# Patient Record
Sex: Male | Born: 1935 | Race: White | Hispanic: No | Marital: Married | Smoking: Former smoker
Health system: Southern US, Community
[De-identification: ages and names within clinical notes are randomized; demographics above are authoritative.]

## PROBLEM LIST (undated history)

## (undated) DIAGNOSIS — N400 Enlarged prostate without lower urinary tract symptoms: Secondary | ICD-10-CM

## (undated) DIAGNOSIS — J342 Deviated nasal septum: Secondary | ICD-10-CM

## (undated) DIAGNOSIS — R918 Other nonspecific abnormal finding of lung field: Secondary | ICD-10-CM

## (undated) DIAGNOSIS — Z923 Personal history of irradiation: Secondary | ICD-10-CM

## (undated) DIAGNOSIS — R0602 Shortness of breath: Secondary | ICD-10-CM

## (undated) DIAGNOSIS — F32A Depression, unspecified: Secondary | ICD-10-CM

## (undated) DIAGNOSIS — M1990 Unspecified osteoarthritis, unspecified site: Secondary | ICD-10-CM

## (undated) DIAGNOSIS — K219 Gastro-esophageal reflux disease without esophagitis: Secondary | ICD-10-CM

## (undated) DIAGNOSIS — G709 Myoneural disorder, unspecified: Secondary | ICD-10-CM

## (undated) DIAGNOSIS — F319 Bipolar disorder, unspecified: Secondary | ICD-10-CM

## (undated) DIAGNOSIS — R079 Chest pain, unspecified: Secondary | ICD-10-CM

## (undated) DIAGNOSIS — E785 Hyperlipidemia, unspecified: Secondary | ICD-10-CM

## (undated) DIAGNOSIS — N529 Male erectile dysfunction, unspecified: Secondary | ICD-10-CM

## (undated) DIAGNOSIS — S6990XA Unspecified injury of unspecified wrist, hand and finger(s), initial encounter: Secondary | ICD-10-CM

## (undated) DIAGNOSIS — Z87442 Personal history of urinary calculi: Secondary | ICD-10-CM

## (undated) DIAGNOSIS — C801 Malignant (primary) neoplasm, unspecified: Secondary | ICD-10-CM

## (undated) DIAGNOSIS — F329 Major depressive disorder, single episode, unspecified: Secondary | ICD-10-CM

## (undated) DIAGNOSIS — J189 Pneumonia, unspecified organism: Secondary | ICD-10-CM

## (undated) DIAGNOSIS — G5760 Lesion of plantar nerve, unspecified lower limb: Secondary | ICD-10-CM

## (undated) DIAGNOSIS — H269 Unspecified cataract: Secondary | ICD-10-CM

## (undated) DIAGNOSIS — T7840XA Allergy, unspecified, initial encounter: Secondary | ICD-10-CM

## (undated) DIAGNOSIS — E291 Testicular hypofunction: Secondary | ICD-10-CM

## (undated) HISTORY — DX: Male erectile dysfunction, unspecified: N52.9

## (undated) HISTORY — DX: Gilbert syndrome: E80.4

## (undated) HISTORY — DX: Bipolar disorder, unspecified: F31.9

## (undated) HISTORY — DX: Lesion of plantar nerve, unspecified lower limb: G57.60

## (undated) HISTORY — DX: Other nonspecific abnormal finding of lung field: R91.8

## (undated) HISTORY — DX: Hyperlipidemia, unspecified: E78.5

## (undated) HISTORY — PX: EYE SURGERY: SHX253

## (undated) HISTORY — DX: Unspecified cataract: H26.9

## (undated) HISTORY — DX: Deviated nasal septum: J34.2

## (undated) HISTORY — DX: Gastro-esophageal reflux disease without esophagitis: K21.9

## (undated) HISTORY — DX: Benign prostatic hyperplasia without lower urinary tract symptoms: N40.0

## (undated) HISTORY — DX: Myoneural disorder, unspecified: G70.9

## (undated) HISTORY — DX: Testicular hypofunction: E29.1

## (undated) HISTORY — DX: Chest pain, unspecified: R07.9

---

## 1959-07-03 HISTORY — PX: PILONIDAL CYST EXCISION: SHX744

## 1994-11-01 HISTORY — PX: PROSTATE SURGERY: SHX751

## 2001-02-14 ENCOUNTER — Encounter: Admission: RE | Admit: 2001-02-14 | Discharge: 2001-02-14 | Payer: Self-pay | Admitting: Orthopedic Surgery

## 2001-02-14 ENCOUNTER — Encounter: Payer: Self-pay | Admitting: Orthopedic Surgery

## 2001-07-21 ENCOUNTER — Ambulatory Visit (HOSPITAL_COMMUNITY): Admission: RE | Admit: 2001-07-21 | Discharge: 2001-07-21 | Payer: Self-pay | Admitting: Internal Medicine

## 2002-12-03 ENCOUNTER — Encounter: Payer: Self-pay | Admitting: Otolaryngology

## 2002-12-03 ENCOUNTER — Encounter: Admission: RE | Admit: 2002-12-03 | Discharge: 2002-12-03 | Payer: Self-pay | Admitting: Otolaryngology

## 2007-01-31 HISTORY — PX: KNEE ARTHROSCOPY: SUR90

## 2007-02-24 ENCOUNTER — Encounter: Admission: RE | Admit: 2007-02-24 | Discharge: 2007-02-24 | Payer: Self-pay | Admitting: Orthopedic Surgery

## 2008-11-01 DIAGNOSIS — H269 Unspecified cataract: Secondary | ICD-10-CM

## 2008-11-01 HISTORY — DX: Unspecified cataract: H26.9

## 2012-04-06 ENCOUNTER — Other Ambulatory Visit: Payer: Self-pay | Admitting: Internal Medicine

## 2012-04-06 DIAGNOSIS — M545 Low back pain, unspecified: Secondary | ICD-10-CM

## 2012-04-08 ENCOUNTER — Ambulatory Visit
Admission: RE | Admit: 2012-04-08 | Discharge: 2012-04-08 | Disposition: A | Discharge status: Home / Self Care | Payer: Federal, State, Local not specified - PPO | Source: Ambulatory Visit | Attending: Internal Medicine | Admitting: Internal Medicine

## 2012-04-08 DIAGNOSIS — M545 Low back pain, unspecified: Secondary | ICD-10-CM

## 2012-08-01 DIAGNOSIS — E639 Nutritional deficiency, unspecified: Secondary | ICD-10-CM | POA: Insufficient documentation

## 2012-08-01 DIAGNOSIS — E559 Vitamin D deficiency, unspecified: Secondary | ICD-10-CM | POA: Insufficient documentation

## 2012-08-01 DIAGNOSIS — J449 Chronic obstructive pulmonary disease, unspecified: Secondary | ICD-10-CM | POA: Insufficient documentation

## 2013-01-24 ENCOUNTER — Other Ambulatory Visit: Payer: Self-pay | Admitting: Internal Medicine

## 2013-01-24 DIAGNOSIS — R220 Localized swelling, mass and lump, head: Secondary | ICD-10-CM

## 2013-02-07 ENCOUNTER — Ambulatory Visit
Admission: RE | Admit: 2013-02-07 | Discharge: 2013-02-07 | Disposition: A | Discharge status: Home / Self Care | Payer: Federal, State, Local not specified - PPO | Source: Ambulatory Visit | Attending: Internal Medicine | Admitting: Internal Medicine

## 2013-02-07 DIAGNOSIS — R220 Localized swelling, mass and lump, head: Secondary | ICD-10-CM

## 2013-02-07 DIAGNOSIS — R918 Other nonspecific abnormal finding of lung field: Secondary | ICD-10-CM

## 2013-02-07 HISTORY — DX: Other nonspecific abnormal finding of lung field: R91.8

## 2013-02-07 MED ORDER — IOHEXOL 300 MG/ML  SOLN
75.0000 mL | Freq: Once | INTRAMUSCULAR | Status: AC | PRN
  Administered 2013-02-07: 75 mL via INTRAVENOUS

## 2013-02-09 ENCOUNTER — Other Ambulatory Visit (HOSPITAL_COMMUNITY): Payer: Self-pay | Admitting: Internal Medicine

## 2013-02-09 DIAGNOSIS — R918 Other nonspecific abnormal finding of lung field: Secondary | ICD-10-CM

## 2013-02-13 ENCOUNTER — Encounter: Payer: Self-pay | Admitting: *Deleted

## 2013-02-13 DIAGNOSIS — G5760 Lesion of plantar nerve, unspecified lower limb: Secondary | ICD-10-CM | POA: Insufficient documentation

## 2013-02-13 DIAGNOSIS — N400 Enlarged prostate without lower urinary tract symptoms: Secondary | ICD-10-CM | POA: Insufficient documentation

## 2013-02-13 DIAGNOSIS — N529 Male erectile dysfunction, unspecified: Secondary | ICD-10-CM | POA: Insufficient documentation

## 2013-02-13 DIAGNOSIS — E291 Testicular hypofunction: Secondary | ICD-10-CM | POA: Insufficient documentation

## 2013-02-13 DIAGNOSIS — C3410 Malignant neoplasm of upper lobe, unspecified bronchus or lung: Secondary | ICD-10-CM | POA: Insufficient documentation

## 2013-02-13 DIAGNOSIS — K219 Gastro-esophageal reflux disease without esophagitis: Secondary | ICD-10-CM | POA: Insufficient documentation

## 2013-02-13 DIAGNOSIS — F319 Bipolar disorder, unspecified: Secondary | ICD-10-CM | POA: Insufficient documentation

## 2013-02-13 DIAGNOSIS — G709 Myoneural disorder, unspecified: Secondary | ICD-10-CM | POA: Insufficient documentation

## 2013-02-14 ENCOUNTER — Ambulatory Visit (HOSPITAL_COMMUNITY)
Admission: RE | Admit: 2013-02-14 | Discharge: 2013-02-14 | Disposition: A | Discharge status: Home / Self Care | Payer: Federal, State, Local not specified - PPO | Source: Ambulatory Visit | Attending: Internal Medicine | Admitting: Internal Medicine

## 2013-02-14 DIAGNOSIS — R918 Other nonspecific abnormal finding of lung field: Secondary | ICD-10-CM

## 2013-02-14 DIAGNOSIS — N200 Calculus of kidney: Secondary | ICD-10-CM | POA: Insufficient documentation

## 2013-02-14 DIAGNOSIS — R222 Localized swelling, mass and lump, trunk: Secondary | ICD-10-CM | POA: Insufficient documentation

## 2013-02-14 DIAGNOSIS — J90 Pleural effusion, not elsewhere classified: Secondary | ICD-10-CM | POA: Insufficient documentation

## 2013-02-14 MED ORDER — FLUDEOXYGLUCOSE F - 18 (FDG) INJECTION
19.3000 | Freq: Once | INTRAVENOUS | Status: AC | PRN
  Administered 2013-02-14: 19.3 via INTRAVENOUS

## 2013-02-15 ENCOUNTER — Other Ambulatory Visit: Payer: Self-pay | Admitting: *Deleted

## 2013-02-15 ENCOUNTER — Institutional Professional Consult (permissible substitution) (INDEPENDENT_AMBULATORY_CARE_PROVIDER_SITE_OTHER): Payer: Federal, State, Local not specified - PPO | Admitting: Cardiothoracic Surgery

## 2013-02-15 ENCOUNTER — Encounter: Payer: Self-pay | Admitting: Cardiothoracic Surgery

## 2013-02-15 VITALS — BP 130/83 | HR 84 | Resp 20 | Ht 71.0 in | Wt 185.0 lb

## 2013-02-15 DIAGNOSIS — R911 Solitary pulmonary nodule: Secondary | ICD-10-CM

## 2013-02-15 NOTE — Patient Instructions (Signed)
Lung Cancer Lung cancer is a tumor which starts as a growth in your lungs. Cancer is a group of many related diseases that begin in cells, the building blocks of the body. Normally, cells grow and divide to produce more cells only when the body needs them. Sometimes cells keep dividing when new cells are not needed. These extra cells may form a mass of tissue called a growth or tumor. Tumors can be either benign (not cancerous) or malignant (cancerous). Cancer can begin in any organ or tissue of the body. The original tumor (where the tumor started out) is called the primary cancer and is usually named for where it begins.  Lung cancer is the most common cause of cancer death in men and women. There are several different types of lung cancers. Usually, lung cancer is described as either small-cell lung cancer or non-small-cell lung cancer. Other types of cancer occur in the lungs, including carcinoid and cancers spread from other organs. The types of cancer have different behavior and treatment. CAUSES  This cancer usually starts when the lungs are exposed to harmful chemicals. When you quit smoking, your risk of lung cancer falls each year (but is never the same as a person who has never smoked).  Other risks include:   Radon gas exposure.  Asbestos and other industrial substance exposure.  Second hand tobacco smoke.  Air pollution.  Family or personal history of lung cancer.  Age over 65. SYMPTOMS  Lung cancer can cause many symptoms. They depend on the type of cancer, its location and other factors. Symptoms of lung cancer can include:  Cough (either new, different or more severe).  Shortness of breath.  Coughing up blood (hemoptysis).  Chest pain.  Hoarseness.  Swelling of the face.  Drooping eyelid.  Changes in blood tests: low sodium (hyponatremia), high calcium (hypercalcemia) or low blood count (anemia).  Weight loss. In its early stages, lung cancer may not have  symptoms and can be discovered by accident. Many of the symptoms above can be caused by diseases other than lung cancer. DIAGNOSIS  In early lung cancer, the patient often does not notice problems. It usually has spread by the time problems are first noticed. Your caregiver may suspect lung cancer based on your symptoms, your exam or based on tests (such as x-rays) obtained for other reasons. Common tests that help your caregiver diagnose your condition include:  Chest x-ray.  CT scan of the lungs and chest.  Blood tests. If a tumor is found, a biopsy will be necessary to confirm that cancer is present and to determine the type of cancer. TREATMENT   Surgery offers a hope for a cure if the cancer has not spread and the cancer is not a small cell (oat cell) cancer of the lung. Surgery cannot cure the small cell type of cancer.  Radiation Therapy is a form of high energy X-ray that helps slow or kill the cancer. It is often used along with medications (chemotherapy) to help treat the cancer and control pain.  Chemotherapy is used in combination with surgery in advanced cancer. It is also used in all small cell cancers.  Many new treatments look promising.  Your caregiver can give you more information and discuss treatment options that are best for your type of cancer. HOME CARE INSTRUCTIONS   If you smoke, stop!  Take all medications as told.  Keep all appointments with your caregiver and other specialists.  Ask your caregiver if you should   see a cancer specialist, if that has not been arranged.  If you require oxygen or breathing equipment, be sure you know how to use it and who to call with questions.  Follow any special diet directions. If you have problems with appetite, ask your caregiver for help. SEEK MEDICAL CARE IF:   You have had a surgical procedure are you are having trouble recovering.  You have ongoing weight loss.  You have decreased strength or energy past the  point when your caregiver said you would feel better.  You develop nausea or lightheadedness.  You have pain that is not improving. SEEK IMMEDIATE MEDICAL CARE IF:   You cough up clotted blood or bright red blood.  Your pain is uncontrolled.  You develop new difficulty breathing or chest pain.  You develop swelling in one or both ankles or legs, or swelling in your face or neck.  You develop new headache or confusion. Document Released: 01/24/2001 Document Revised: 01/10/2012 Document Reviewed: 11/04/2008 [employer] Patient Information 2013 [employer].  Lung Resection A lung resection is surgery to remove a lung. When an entire lung is removed, the procedure is called a pneumonectomy. When only part of a lung is removed, the procedure is called a lobectomy. A lung resection is typically done to get rid of a tumor or cancer. This surgery can help relieve some or all of your symptoms. The surgery can also help keep the problem from getting worse. It may provide the best chance for curing your disease. However, surgery may not necessarily cure lung cancer, if that is the problem. Most people need to stay in the hospital for several days after this procedure.  LET YOUR CAREGIVER KNOW ABOUT:  Allergies to food or medicine.  Medicines taken, including vitamins, herbs, eyedrops, over-the-counter medicines, and creams.  Use of steroids (by mouth or creams).  Previous problems with anesthetics or numbing medicines.  History of bleeding problems or blood clots.  Previous surgery.  Other health problems, including diabetes and kidney problems.  Possibility of pregnancy, if this applies. RISKS AND COMPLICATIONS  Lung resections have been done for many years with good results and few complications. However, all surgery is associated with possible risks. Some of these risks are:  Excessive bleeding.  Infection.  Inability to breath without a ventilator.  Persistent shortness of  breath.  Heart problems, including abnormal rhythms and a risk of heart attack or heart failure.  Blood clots.  Injury to a blood vessel.  Injury to a nerve.  Failure to heal properly.  Stroke.  Bronchopleural fistula. This is a small hole between one of the main breathing tubes and the lining of the lungs. BEFORE THE PROCEDURE  In order to prepare for surgery, your caregiver may ask for several tests to be done. These may include:  Blood tests.  Urine tests.  X-rays.  Imaging tests, such as CT scans, MRI scans, and PET scans. These tests are done to find the exact size and location of the tumor that will be removed.  Pulmonary function tests (PFTs). These are breathing tests to assess the function of your lungs before surgery and to decide how to best help your breathing after surgery.  Heart testing. This is done to make sure your heart is strong enough for the procedure.  Bronchoscopy. This is a technique that allows your caregiver to look at the inside of your airways. This is done using a soft, flexible tube (bronchoscope). Along with imaging tests, this can help   your caregiver know the exact location and size of the area that will be removed during surgery.  Lymph node sampling. This may need to be done to see if the tumor has spread. It may be done as a separate surgery or right before your lung resection procedure. PROCEDURE  An intravenous line (IV) will be placed in your arm. You will be given medicine that makes you sleep (general anesthetic).  Once you are asleep, a breathing tube is placed into your windpipe. You may also get pain medicine through a thin, flexible tube (catheter) in your back. The catheter is put through your skin and next to your spinal cord, where it releases anesthetic medicine.  Next, you will be turned onto your side. This makes it easier for your surgeon to reach the area of your ribcage where the surgical cut (incision) will be made. This  area is washed with a disinfectant solution and might also be shaved. A catheter will be put into your bladder to collect urine. Another tube will be carefully passed through your throat and into your stomach.  The surgeon will make an incision on your side, which will start between two of your ribs and go around to your back. Your ribs will be spread and held open. Part of one rib may be removed to make it easier for the surgeon to reach your lung.  Your surgeon will carefully cut the veins, arteries, and bronchus leading to the lung. After being cut, each of these pieces will be sewn or stapled closed. Then, the lung or part of the lung will be removed.  Your surgeon will check inside your chest to make sure there is no bleeding in or around the lungs. Lymph nodes near the lung may also be removed for later tests. This is done to check if your problems have spread to the lymph nodes.  Depending on your situation, your surgeon may put tubes into your chest to drain extra fluid and air from the chest cavity after surgery. After the tubes are in, your ribcage will be closed with stitches. The stitches help your ribcage heal and keep it from moving. After this, the layers of tissue under the skin are closed with more stitches, which will dissolve inside your body over time. Finally, your skin is closed with stitches or staples and covered with a bandage. AFTER THE PROCEDURE   After surgery, you will be taken to the recovery area where a nurse will monitor your progress. You may still have a breathing tube, spinal catheter, bladder catheter, stomach tube, and possibly chest tubes inside your body. These will be removed during your recovery. You may be put on a respirator following surgery if some assistance is needed to help your breathing. When you are awake, stable, and without complications, you will likely continue recovery in the intensive care unit (ICU).  As you wake up, you might feel some aches  and pains in your chest and throat. Sometimes during recovery, patients may shiver or feel nauseous. Both of these symptoms are temporary and may be caused by the anesthesia. Your caregivers can give you medicine to help these problems go away.  The breathing tube will be taken out as soon as your caregivers feel you can breathe on your own. For most people, this happens on the same day as the surgery.  If your surgery and time in the ICU go well, most of the tubes and equipment will be taken out within the first   1 to 2 days after surgery. This is about how long most people stay in the ICU. You may need to stay longer, depending on how you are doing.  You should also start respiratory therapy in the ICU. This therapy uses breathing exercises to help your other lung stay healthy and get stronger.  As you improve, you will be moved to a regular hospital room for continued respiratory therapy, help with your bladder and bowels, and to continue medicines. Most people stay in the hospital for 5 to 7 days. However, your stay may be longer, depending on how your surgery went and how well you are doing.  After your lung or part of your lung is taken out, there will be a space inside your chest. This space will often fill up with fluid over time. The amount of time this takes is different for each person. Because your chest needs to fill with fluid, your surgeon may or may not put a drainage tube in your chest. If there is a chest tube, it will most likely be removed within 24 hours after the surgery.  You will receive care until you are doing well and your caregiver feels it is safe for you to go home or to transfer to an extended care facility. Document Released: 01/08/2003 Document Revised: 01/10/2012 Document Reviewed: 06/17/2011 [employer] Patient Information 2013 [employer]. Lung Resection Care After Refer to this sheet in the next few weeks. These instructions provide you with information on caring  for yourself after your procedure. Your caregiver may also give you more specific instructions. Your treatment has been planned according to current medical practices, but problems sometimes occur. Call your caregiver if you have any problems or questions after your procedure. HOME CARE INSTRUCTIONS  You may resume a normal diet and activities as directed.  Do not smoke or use tobacco products.  Change your bandages (dressings) as directed.  Only take over-the-counter or prescription medicines for pain, discomfort, or fever as directed by your caregiver.  Keep all follow-up appointments as directed.  Try to breathe deeply and cough as directed. Holding a pillow firmly over your ribs may help with discomfort.  If you were given an incentive spirometer in the hospital, continue to use it as directed.  Walk as directed by your caregiver.  You may take a shower and gently wash the area of your surgical cut (incision) with water and soap as directed. Do not use anything else to clean your incision except as directed by your caregiver. Do not take baths or sit in a hot tub. SEEK MEDICAL CARE IF:  You notice redness, swelling, or increasing pain in the incision.  You are bleeding from the incision.  You see pus coming from the incision.  You notice a bad smell coming from the incision or dressing.  Your incision breaks open.  You cough up blood or pus, or you develop a cough that produces bad smelling sputum.  You have pain or swelling in your legs.  You have increasing pain that is not controlled with medicine.  You have trouble managing any of the tubes that have been left in place after surgery. SEEK IMMEDIATE MEDICAL CARE IF:   You have a fever or chills.  You have any reaction or side effects to medicines given.  You have chest pain or an irregular or rapid heartbeat.  You have dizzy episodes or fainting.  You have shortness of breath or difficulty breathing.  You  have persistent nausea   or vomiting.  You have a rash. MAKE SURE YOU:  Understand these instructions.  Will watch your condition.  Will get help right away if you are not doing well or get worse. Document Released: 05/07/2005 Document Revised: 01/10/2012 Document Reviewed: 06/17/2011 [employer] Patient Information 2013 [employer]    

## 2013-02-16 ENCOUNTER — Encounter (HOSPITAL_COMMUNITY): Payer: Self-pay | Admitting: Pharmacy Technician

## 2013-02-19 ENCOUNTER — Other Ambulatory Visit: Payer: Self-pay

## 2013-02-19 DIAGNOSIS — R51 Headache: Secondary | ICD-10-CM

## 2013-02-19 DIAGNOSIS — D381 Neoplasm of uncertain behavior of trachea, bronchus and lung: Secondary | ICD-10-CM

## 2013-02-19 NOTE — Progress Notes (Signed)
[address]            [place] [address]          [phone]      Alvin Hardy [facility] Medical Record #[id number] Date of Birth: 1934-11-22  Referring: [name], MD Primary Care: [name], MD  Chief Complaint:    Chief Complaint  Patient presents with  . Lung Lesion    MTOC/ Surgical eval on lung nodule, Neck CT 02/07/13, PET Scan 02/14/13    History of Present Illness:    78 yo male who noticed voice breaks. Treated with nexium as poss reflux. Because of persistent trouble a  chest xray was done, with finding of lung nodule. Patient has history of smoking, quit in 1980. Patient denies hemoptysis, wheezing.  He denies history of MI or previous cardiac history      Current Activity/ Functional Status:  Patient is independent with mobility/ambulation, transfers, ADL's, IADL's.  Zubrod Score: At the time of surgery this patient's most appropriate activity status/level should be described as: []  Normal activity, no symptoms [x]  Symptoms, fully ambulatory []  Symptoms, in bed less than or equal to 50% of the time []  Symptoms, in bed greater than 50% of the time but less than 100% []  Bedridden []  Moribund   Past Medical History  Diagnosis Date  . BPH (benign prostatic hyperplasia)   . Hypogonadism male   . Bipolar 1 disorder   . ED (erectile dysfunction)   . Hyperlipidemia   . Neuromuscular disorder     peripheral neuropathy  . Cataract 2010    Bilateral  . Laryngopharyngeal reflux   . Gilbert's syndrome   . Deviated septum     TO THE LEFT  . Morton's neuroma     LEFT FOOT  . Lung mass 02/07/13    RIGHT UPPER LOBE    Past Surgical History  Procedure Laterality Date  . Prostate surgery  1996    TURP  . Knee arthroscopy Right 01/2007    Family History  Problem Relation Age of Onset  . Diabetes Mother   . Heart disease Father   . Diabetes Son     History   Social History  . Marital Status: Married   Spouse Name: N/A    Number of Children: N/A  . Years of Education: N/A   Occupational History  . Retired Biomedical scientist   Social History Main Topics  . Smoking status: Former Smoker    Types: Cigarettes    Quit date: 11/01/1978  . Smokeless tobacco: Not on file  . Alcohol Use: Yes  . Drug Use: No  . Sexually Active: Not on file   Other Topics Concern  . Not on file   Social History Narrative  . No narrative on file    History  Smoking status  . Former Smoker  . Types: Cigarettes  . Quit date: 11/01/1978  Smokeless tobacco  . Not on file    History  Alcohol Use  . Yes     Allergies  Allergen Reactions  . Codeine Anaphylaxis  . Zoloft (Sertraline Hcl) Other (See Comments)    REACTION:  unknown    Current Outpatient Prescriptions  Medication Sig Dispense Refill  . calcipotriene (DOVONOX) 0.005 % cream Apply 1 application topically daily as needed (for hands).       . Cholecalciferol (VITAMIN D-3) 1000 UNITS  CAPS Take 1,000 Units by mouth daily.       . clobetasol cream (TEMOVATE) 0.05 % Apply 1 application topically daily.       . clonazePAM (KLONOPIN) 0.5 MG tablet Take 0.5 mg by mouth at bedtime.      Marland Kitchen esomeprazole (NEXIUM) 40 MG capsule Take 40 mg by mouth daily before breakfast.      . finasteride (PROSCAR) 5 MG tablet Take 5 mg by mouth daily.      . folic acid (FOLVITE) 1 MG tablet Take 1 mg by mouth daily.      Marland Kitchen gabapentin (NEURONTIN) 300 MG capsule Take 600-900 mg by mouth 2 (two) times daily. Takes 2 capsules every morning      . glucosamine-chondroitin 500-400 MG tablet Take 1 tablet by mouth 2 (two) times daily.       Marland Kitchen lithium 300 MG tablet Take 300 mg by mouth at bedtime.      . methotrexate (RHEUMATREX) 2.5 MG tablet Take 12.5 mg by mouth every Monday. Caution:Chemotherapy. Protect from light. Take 6 tabs once a week (Monday)      . Multiple Vitamins-Minerals (MULTIVITAMIN PO) Take 2 tablets by mouth daily.       . propranolol (INDERAL) 10 MG  tablet Take 10-20 mg by mouth daily as needed (Takes rarely, only under extremely stressful circumstances.).       Marland Kitchen testosterone cypionate (DEPOTESTOTERONE CYPIONATE) 100 MG/ML injection Inject 100 mg into the muscle every Friday. For IM use only      . vardenafil (LEVITRA) 5 MG tablet Take 5 mg by mouth daily as needed for erectile dysfunction.       Marland Kitchen aspirin EC 81 MG tablet Take 81 mg by mouth daily.      . Calcium Carbonate (CALCIUM 600 PO) Take 1 tablet by mouth daily.      . colesevelam (WELCHOL) 625 MG tablet Take 3 mg by mouth at bedtime. Takes following the largest meal of the day.      . cyanocobalamin 1000 MCG tablet Take 1,000 mcg by mouth daily.       No current facility-administered medications for this visit.       Review of Systems:     Cardiac Review of Systems: Y or N  Chest Pain [  n  ]  Resting SOB [ n  ] Exertional SOB  [ y ]  Pollyann Kennedy Milo.Brash  ]   Pedal Edema [  n ]    Palpitations [n  ] Syncope  [n  ]   Presyncope [ n  ]  General Review of Systems: [Y] = yes [  ]=no Constitional: recent weight change [n  ]; anorexia [  ]; fatigue [ n ]; nausea [  ]; night sweats [  ]; fever [ n ]; or chills [n  ];  Dental: poor dentition[  n]; Last Dentist visit:6 months   Eye : blurred vision [  ]; diplopia [   ]; vision changes [  ];  Amaurosis fugax[  ]; Resp: cough [  ];  wheezing[  ];  hemoptysis[ n ]; shortness of breath[  ]; paroxysmal nocturnal dyspnea[  ]; dyspnea on exertion[  ]; or orthopnea[  ];  GI:  gallstones[  ], vomiting[  ];  dysphagia[  ]; melena[  ];  hematochezia [  ]; heartburn[  ];   Hx of  Colonoscopy[  ]; GU: kidney stones [  ]; hematuria[  ];   dysuria [  ];  nocturia[  ];  history of     obstruction [  ]; urinary frequency [  ]             Skin: rash, swelling[  ];, hair loss[  ];  peripheral edema[  ];  or itching[  ];psorisis  on hands Musculosketetal: myalgias[  ];  joint swelling[  ];  joint erythema[  ];  joint pain[  ];  back pain[  ];  Heme/Lymph: bruising[  ];  bleeding[  ];  anemia[  ];  Neuro: TIA[  ];  headaches[  ];  stroke[  ];  vertigo[  ];  seizures[  ];   paresthesias[y  ];  difficulty walking[  ];  Psych:depression[  ]; anxiety[  ];  Endocrine: diabetes[  ];  thyroid dysfunction[  ];  Immunizations: Flu [ n ]; Pneumococcal[n  ];  Other:  Physical Exam: BP 130/83  Pulse 84  Resp 20  Ht 5\' 11"  (1.803 m)  Wt 185 lb (83.915 kg)  BMI 25.81 kg/m2  SpO2 96%  General appearance: alert, cooperative, appears stated age and no distress Neurologic: intact Heart: regular rate and rhythm, S1, S2 normal, no murmur, click, rub or gallop and normal apical impulse Lungs: clear to auscultation bilaterally and normal percussion bilaterally Abdomen: soft, non-tender; bowel sounds normal; no masses,  no organomegaly Extremities: extremities normal, atraumatic, no cyanosis or edema and Homans sign is negative, no sign of DVT no adenopathy cervical or axillary   Diagnostic Studies & Laboratory data:     Recent Radiology Findings:   Ct Soft Tissue Neck W Contrast  02/07/2013  *RADIOLOGY REPORT*  Clinical Data: 78 year old male with painless palpable abnormality under the right year.  Physician palpable abnormality left neck. Intermittent hoarseness.  BUN and creatinine were obtained on site at [facility] at [address] Results:  BUN 21 mg/dL,  Creatinine 0.9 mg/dL.  CT NECK WITH CONTRAST  Technique:  Multidetector CT imaging of the neck was performed with intravenous contrast.  Contrast: 75mL OMNIPAQUE IOHEXOL 300 MG/ML  SOLN  Comparison: Report of the neck CT 12/03/2002 (no images available).  Findings: In the visible lung apices there is a solid right upper lung lesion with irregular, spiculated margins encompassing 31 x 38 x 27 mm.  Some linear spiculation from lesion extends to the apical pleura  which may be superimposed scarring.  No right paratracheal or visible right hilar lymphadenopathy.  No thoracic inlet lymphadenopathy identified.  In the neck a skin marker was placed on the right side and is seen on series 2 image 29.  The this corresponds to the inferior aspect of the right parotid gland which appears normal.  No regional lymphadenopathy.  No soft tissue mass in this region.  No left side marker is identified to indicate the area of palpable abnormality.  Pharynx,  parapharyngeal spaces, retropharyngeal space, sublingual space, submandibular glands, and parotid glands are within normal limits.  There is mild asymmetry of the larynx which could indicate right vocal cord paralysis.  However, laryngeal asymmetry was also described on the comparison.  The thyroid is normal except for several sub-centimeter hypodense nodules in the left lobe, too small to characterize the most likely benign in the absence of risk factors for thyroid carcinoma.  Major vascular structures in the neck are patent.  Left greater than right carotid bifurcation atherosclerosis.  Negative visualized brain parenchyma.  No cervical lymphadenopathy.  Visualized paranasal sinuses and mastoids are clear.  Advanced degenerative changes in the cervical spine.  No suspicious osseous lesion identified in the neck.  Scoliosis in the thorax.  IMPRESSION:  1.  Spiculated right apical lung nodule/mass encompassing 38 mm greatest dimension.  This is most compatible with a right lung bronchogenic carcinoma. No right paratracheal or visible hilar/mediastinal lymphadenopathy identified. 2.  No neck mass or lymphadenopathy identified.  Study discussed by telephone with  Dr. Rodrigo Ran on 02/07/2013 at 1530 hours.   Original Report Authenticated By: Erskine Speed, M.D.    Nm Pet Image Initial (pi) Skull Base To Thigh  02/14/2013  *RADIOLOGY REPORT*  Clinical Data: Initial treatment strategy for right apical lung mass on prior neck CT.  NUCLEAR  MEDICINE PET SKULL BASE TO THIGH  Fasting Blood Glucose:  98  Technique:  19.3 mCi F-18 FDG was injected intravenously. CT data was obtained and used for attenuation correction and anatomic localization only.  (This was not acquired as a diagnostic CT examination.) Additional exam technical data entered on technologist worksheet.  Comparison:  Neck CT 02/08/1947.  Abdominal pelvic CT from [facility] dated 01/25/2013.  Findings:  Neck:  No abnormal hypermetabolism.  Chest:  Corresponding to the previously described abnormality, within the right apex, is a spiculated mass which measures 3.5 x 2.7 cm and a S.U.V. max of 4.9 on image 78/series 2.  Spiculation contacts the posterior right apical pleura and extends to the superior aspect of the right major fissure.  No abnormal nodal activity within the chest.  Abdomen/Pelvis:  No abnormal hypermetabolism.  Skeleton:  No evidence of hypermetabolic osseous lesions.  CT  images performed for attenuation correction demonstrate no cervical adenopathy.  Mild cardiomegaly, without pericardial effusion.  Tiny right and trace left pleural fluid  Normal adrenal glands.  Right nephrolithiasis.  Bilateral fluid density renal lesions which are likely cysts.  Old granulomatous disease in the spleen.  Normal urinary bladder.  Moderate prostatomegaly.  Tiny fat containing left inguinal hernia.  IMPRESSION:  1.  Hypermetabolic right apical lung mass, most consistent with primary bronchogenic carcinoma. No thoracic nodal or extrathoracic metastasis.  Presuming non-small cell histology, T2aN0M0 or stage IB. 2.  Trace right greater than left bilateral pleural effusions. 3.  Incidental findings, including right nephrolithiasis and prostatomegaly.   Original Report Authenticated By: Jeronimo Greaves, M.D.     Recent Lab Findings: No results found for this basename: WBC, HGB, HCT, PLT, GLUCOSE, CHOL, TRIG, HDL, LDLDIRECT, LDLCALC, ALT, AST, NA, K, CL, CREATININE, BUN, CO2, TSH, INR, GLUF,  HGBA1C      Assessment / Plan:   Clinicial  T2aN0M0 or stage IB Lung Cancer by CT and PET scan without tissue Dx  I have recommended surgical resection of Right Upper Lobe for DX and treatment. Risks of surgery and and treatment options have been discussed with patient in  Detail. Patient is agreeable with proceeding  next week.        Delight Ovens MD  Beeper [phone] Office [phone] 02/19/2013 10:08 PM

## 2013-02-20 ENCOUNTER — Ambulatory Visit (HOSPITAL_COMMUNITY)
Admission: RE | Admit: 2013-02-20 | Discharge: 2013-02-20 | Disposition: A | Discharge status: Home / Self Care | Payer: Medicare Other | Source: Ambulatory Visit | Attending: Cardiothoracic Surgery | Admitting: Cardiothoracic Surgery

## 2013-02-20 ENCOUNTER — Encounter (HOSPITAL_COMMUNITY): Payer: Self-pay

## 2013-02-20 ENCOUNTER — Encounter (HOSPITAL_COMMUNITY)
Admission: RE | Admit: 2013-02-20 | Discharge: 2013-02-20 | Disposition: A | Discharge status: Home / Self Care | Payer: Medicare Other | Source: Ambulatory Visit | Attending: Cardiothoracic Surgery | Admitting: Cardiothoracic Surgery

## 2013-02-20 ENCOUNTER — Inpatient Hospital Stay (HOSPITAL_COMMUNITY)
Admission: RE | Admit: 2013-02-20 | Discharge: 2013-02-20 | Disposition: A | Discharge status: Home / Self Care | Payer: Medicare Other | Source: Ambulatory Visit | Attending: Cardiothoracic Surgery | Admitting: Cardiothoracic Surgery

## 2013-02-20 ENCOUNTER — Telehealth: Payer: Self-pay | Admitting: Cardiothoracic Surgery

## 2013-02-20 VITALS — BP 134/85 | HR 93 | Temp 99.0°F | Resp 20 | Ht 70.0 in | Wt 185.0 lb

## 2013-02-20 DIAGNOSIS — R911 Solitary pulmonary nodule: Secondary | ICD-10-CM

## 2013-02-20 DIAGNOSIS — D381 Neoplasm of uncertain behavior of trachea, bronchus and lung: Secondary | ICD-10-CM

## 2013-02-20 DIAGNOSIS — R51 Headache: Secondary | ICD-10-CM

## 2013-02-20 HISTORY — DX: Unspecified injury of unspecified wrist, hand and finger(s), initial encounter: S69.90XA

## 2013-02-20 HISTORY — DX: Shortness of breath: R06.02

## 2013-02-20 HISTORY — DX: Unspecified osteoarthritis, unspecified site: M19.90

## 2013-02-20 HISTORY — DX: Malignant (primary) neoplasm, unspecified: C80.1

## 2013-02-20 HISTORY — DX: Gastro-esophageal reflux disease without esophagitis: K21.9

## 2013-02-20 LAB — SURGICAL PCR SCREEN
MRSA, PCR: NEGATIVE
Staphylococcus aureus: NEGATIVE

## 2013-02-20 LAB — BLOOD GAS, ARTERIAL
Acid-base deficit: 1.1 mmol/L (ref 0.0–2.0)
Bicarbonate: 22.6 mEq/L (ref 20.0–24.0)
Drawn by: 344381
FIO2: 0.21 %
O2 Saturation: 98.2 %
Patient temperature: 98.6
TCO2: 23.7 mmol/L (ref 0–100)
pCO2 arterial: 34.9 mmHg — ABNORMAL LOW (ref 35.0–45.0)
pH, Arterial: 7.428 (ref 7.350–7.450)
pO2, Arterial: 86.3 mmHg (ref 80.0–100.0)

## 2013-02-20 LAB — URINALYSIS, ROUTINE W REFLEX MICROSCOPIC
Bilirubin Urine: NEGATIVE
Glucose, UA: NEGATIVE mg/dL
Hgb urine dipstick: NEGATIVE
Ketones, ur: NEGATIVE mg/dL
Leukocytes, UA: NEGATIVE
Nitrite: NEGATIVE
Protein, ur: NEGATIVE mg/dL
Specific Gravity, Urine: 1.022 (ref 1.005–1.030)
Urobilinogen, UA: 0.2 mg/dL (ref 0.0–1.0)
pH: 5.5 (ref 5.0–8.0)

## 2013-02-20 LAB — COMPREHENSIVE METABOLIC PANEL
ALT: 23 U/L (ref 0–53)
AST: 26 U/L (ref 0–37)
Albumin: 4 g/dL (ref 3.5–5.2)
Alkaline Phosphatase: 53 U/L (ref 39–117)
BUN: 20 mg/dL (ref 6–23)
CO2: 21 mEq/L (ref 19–32)
Calcium: 9.3 mg/dL (ref 8.4–10.5)
Chloride: 105 mEq/L (ref 96–112)
Creatinine, Ser: 1.02 mg/dL (ref 0.50–1.35)
GFR calc Af Amer: 81 mL/min — ABNORMAL LOW (ref >90)
GFR calc non Af Amer: 70 mL/min — ABNORMAL LOW (ref >90)
Glucose, Bld: 127 mg/dL — ABNORMAL HIGH (ref 70–99)
Potassium: 4.3 mEq/L (ref 3.5–5.1)
Sodium: 137 mEq/L (ref 135–145)
Total Bilirubin: 0.9 mg/dL (ref 0.3–1.2)
Total Protein: 6.7 g/dL (ref 6.0–8.3)

## 2013-02-20 LAB — TYPE AND SCREEN
ABO/RH(D): A POS
Antibody Screen: NEGATIVE

## 2013-02-20 LAB — CBC
HCT: 44.1 % (ref 39.0–52.0)
Hemoglobin: 16 g/dL (ref 13.0–17.0)
MCH: 32.1 pg (ref 26.0–34.0)
MCHC: 36.3 g/dL — ABNORMAL HIGH (ref 30.0–36.0)
MCV: 88.6 fL (ref 78.0–100.0)
Platelets: 193 10*3/uL (ref 150–400)
RBC: 4.98 MIL/uL (ref 4.22–5.81)
RDW: 13.6 % (ref 11.5–15.5)
WBC: 10.6 10*3/uL — ABNORMAL HIGH (ref 4.0–10.5)

## 2013-02-20 LAB — PROTIME-INR
INR: 0.99 (ref 0.00–1.49)
Prothrombin Time: 13 seconds (ref 11.6–15.2)

## 2013-02-20 LAB — APTT: aPTT: 27 seconds (ref 24–37)

## 2013-02-20 LAB — ABO/RH: ABO/RH(D): A POS

## 2013-02-20 MED ORDER — DEXTROSE 5 % IV SOLN
1.5000 g | INTRAVENOUS | Status: DC
  Filled 2013-02-20: qty 1.5

## 2013-02-20 MED ORDER — GADOBENATE DIMEGLUMINE 529 MG/ML IV SOLN
17.0000 mL | Freq: Once | INTRAVENOUS | Status: AC
  Administered 2013-02-20: 17 mL via INTRAVENOUS

## 2013-02-20 NOTE — Pre-Procedure Instructions (Addendum)
RISHAWN WALCK  02/20/2013   Your procedure is scheduled on: 02/21/2013  Report to [facility] at 6:30 AM.  Call this number if you have problems the morning of surgery: [phone]   Remember:   Do not eat food or drink liquids after midnight. Tuesday- tonight   Take these medicines the morning of surgery with A SIP OF WATER: nexium, gabapentin, proscar   Do not wear jewelry, make-up or nail polish.  Do not wear lotions, powders, or perfumes. You may wear deodorant.  Do not shave 48 hours prior to surgery. Men may shave face and neck.  Do not bring valuables to the hospital.  Contacts, dentures or bridgework may not be worn into surgery.  Leave suitcase in the car. After surgery it may be brought to your room.  For patients admitted to the hospital, checkout time is 11:00 AM the day of  discharge.   Patients discharged the day of surgery will not be allowed to drive  home.  Name and phone number of your driver: /w wife   Special Instructions: Shower using CHG 2 nights before surgery and the night before surgery.  If you shower the day of surgery use CHG.  Use special wash - you have one bottle of CHG for all showers.  You should use approximately 1/3 of the bottle for each shower.   Please read over the following fact sheets that you were given: Pain Booklet, Coughing and Deep Breathing, Blood Transfusion Information, MRSA Information and Surgical Site Infection Prevention

## 2013-02-20 NOTE — Progress Notes (Signed)
Seen by Dr. Waynard Edwards, for PCP, perhaps had treadmill test maybe 5 yrs. Ago, pt. Never seen by cardiologist.

## 2013-02-20 NOTE — Telephone Encounter (Signed)
Return the patient's call this morning, he called the office just today with some questions about upcoming surgery tomorrow. I reviewed with him again the findings on CT and PET scan of a right upper lobe hypermetabolic lung lesion highly suspicious for malignancy but without evidence of metastatic spread. Patient is completing his preoperative workup with PFTs today. His questions were answered fully.

## 2013-02-21 ENCOUNTER — Encounter (HOSPITAL_COMMUNITY): Payer: Self-pay | Admitting: *Deleted

## 2013-02-21 ENCOUNTER — Inpatient Hospital Stay (HOSPITAL_COMMUNITY): Payer: Medicare Other

## 2013-02-21 ENCOUNTER — Inpatient Hospital Stay (HOSPITAL_COMMUNITY)
Admission: RE | Admit: 2013-02-21 | Discharge: 2013-02-26 | DRG: 164 | Disposition: A | Discharge status: Home / Self Care | Payer: Medicare Other | Source: Ambulatory Visit | Attending: Cardiothoracic Surgery | Admitting: Cardiothoracic Surgery

## 2013-02-21 ENCOUNTER — Encounter (HOSPITAL_COMMUNITY): Payer: Self-pay | Admitting: Certified Registered"

## 2013-02-21 ENCOUNTER — Inpatient Hospital Stay (HOSPITAL_COMMUNITY): Payer: Medicare Other | Admitting: Certified Registered"

## 2013-02-21 ENCOUNTER — Encounter (HOSPITAL_COMMUNITY)
Admission: RE | Disposition: A | Discharge status: Home / Self Care | Payer: Self-pay | Source: Ambulatory Visit | Attending: Cardiothoracic Surgery

## 2013-02-21 DIAGNOSIS — Z87891 Personal history of nicotine dependence: Secondary | ICD-10-CM

## 2013-02-21 DIAGNOSIS — R911 Solitary pulmonary nodule: Secondary | ICD-10-CM

## 2013-02-21 DIAGNOSIS — F319 Bipolar disorder, unspecified: Secondary | ICD-10-CM | POA: Diagnosis present

## 2013-02-21 DIAGNOSIS — E291 Testicular hypofunction: Secondary | ICD-10-CM | POA: Diagnosis present

## 2013-02-21 DIAGNOSIS — D381 Neoplasm of uncertain behavior of trachea, bronchus and lung: Secondary | ICD-10-CM

## 2013-02-21 DIAGNOSIS — K5900 Constipation, unspecified: Secondary | ICD-10-CM | POA: Diagnosis not present

## 2013-02-21 DIAGNOSIS — C3410 Malignant neoplasm of upper lobe, unspecified bronchus or lung: Secondary | ICD-10-CM | POA: Diagnosis present

## 2013-02-21 DIAGNOSIS — J9819 Other pulmonary collapse: Secondary | ICD-10-CM | POA: Diagnosis not present

## 2013-02-21 DIAGNOSIS — N400 Enlarged prostate without lower urinary tract symptoms: Secondary | ICD-10-CM | POA: Diagnosis present

## 2013-02-21 DIAGNOSIS — D72829 Elevated white blood cell count, unspecified: Secondary | ICD-10-CM | POA: Diagnosis not present

## 2013-02-21 DIAGNOSIS — G609 Hereditary and idiopathic neuropathy, unspecified: Secondary | ICD-10-CM | POA: Diagnosis present

## 2013-02-21 DIAGNOSIS — R339 Retention of urine, unspecified: Secondary | ICD-10-CM | POA: Diagnosis not present

## 2013-02-21 DIAGNOSIS — N529 Male erectile dysfunction, unspecified: Secondary | ICD-10-CM | POA: Diagnosis present

## 2013-02-21 DIAGNOSIS — L4050 Arthropathic psoriasis, unspecified: Secondary | ICD-10-CM | POA: Diagnosis present

## 2013-02-21 DIAGNOSIS — K219 Gastro-esophageal reflux disease without esophagitis: Secondary | ICD-10-CM | POA: Diagnosis present

## 2013-02-21 DIAGNOSIS — E785 Hyperlipidemia, unspecified: Secondary | ICD-10-CM | POA: Diagnosis present

## 2013-02-21 HISTORY — PX: VIDEO BRONCHOSCOPY: SHX5072

## 2013-02-21 HISTORY — PX: VIDEO ASSISTED THORACOSCOPY (VATS)/WEDGE RESECTION: SHX6174

## 2013-02-21 HISTORY — PX: CYSTOSCOPY: SHX5120

## 2013-02-21 LAB — PULMONARY FUNCTION TEST

## 2013-02-21 SURGERY — VIDEO ASSISTED THORACOSCOPY (VATS)/WEDGE RESECTION
Anesthesia: General | Site: Urethra | Laterality: Right | Wound class: Clean Contaminated

## 2013-02-21 MED ORDER — ONDANSETRON HCL 4 MG/2ML IJ SOLN
4.0000 mg | Freq: Four times a day (QID) | INTRAMUSCULAR | Status: DC | PRN
  Administered 2013-02-22 – 2013-02-25 (×7): 4 mg via INTRAVENOUS
  Filled 2013-02-21 (×4): qty 2

## 2013-02-21 MED ORDER — DIPHENHYDRAMINE HCL 12.5 MG/5ML PO ELIX
12.5000 mg | ORAL_SOLUTION | Freq: Four times a day (QID) | ORAL | Status: DC | PRN
  Filled 2013-02-21: qty 5

## 2013-02-21 MED ORDER — ROCURONIUM BROMIDE 100 MG/10ML IV SOLN
INTRAVENOUS | Status: DC | PRN
  Administered 2013-02-21: 50 mg via INTRAVENOUS

## 2013-02-21 MED ORDER — FINASTERIDE 5 MG PO TABS
5.0000 mg | ORAL_TABLET | Freq: Every day | ORAL | Status: DC
  Administered 2013-02-22 – 2013-02-26 (×5): 5 mg via ORAL
  Filled 2013-02-21 (×7): qty 1

## 2013-02-21 MED ORDER — VITAMIN B-12 1000 MCG PO TABS
1000.0000 ug | ORAL_TABLET | Freq: Every day | ORAL | Status: DC
  Administered 2013-02-22 – 2013-02-26 (×5): 1000 ug via ORAL
  Filled 2013-02-21 (×6): qty 1

## 2013-02-21 MED ORDER — GLYCOPYRROLATE 0.2 MG/ML IJ SOLN
INTRAMUSCULAR | Status: DC | PRN
  Administered 2013-02-21: .8 mg via INTRAVENOUS

## 2013-02-21 MED ORDER — GABAPENTIN 600 MG PO TABS
900.0000 mg | ORAL_TABLET | Freq: Every day | ORAL | Status: DC
  Filled 2013-02-21: qty 1.5

## 2013-02-21 MED ORDER — OXYCODONE HCL 5 MG PO TABS: 5.0000 mg | ORAL_TABLET | Freq: Once | ORAL | Status: DC | PRN

## 2013-02-21 MED ORDER — LACTATED RINGERS IV SOLN
INTRAVENOUS | Status: DC | PRN
  Administered 2013-02-21: 08:00:00 via INTRAVENOUS

## 2013-02-21 MED ORDER — FENTANYL CITRATE 0.05 MG/ML IJ SOLN
INTRAMUSCULAR | Status: DC | PRN
  Administered 2013-02-21: 100 ug via INTRAVENOUS
  Administered 2013-02-21 (×8): 50 ug via INTRAVENOUS

## 2013-02-21 MED ORDER — COLESEVELAM HCL 625 MG PO TABS: 3.0000 mg | ORAL_TABLET | Freq: Every day | ORAL | Status: DC

## 2013-02-21 MED ORDER — NALOXONE HCL 0.4 MG/ML IJ SOLN: 0.4000 mg | INTRAMUSCULAR | Status: DC | PRN

## 2013-02-21 MED ORDER — ONDANSETRON HCL 4 MG/2ML IJ SOLN
4.0000 mg | Freq: Four times a day (QID) | INTRAMUSCULAR | Status: DC | PRN
  Filled 2013-02-21 (×3): qty 2

## 2013-02-21 MED ORDER — PHENYLEPHRINE HCL 10 MG/ML IJ SOLN
INTRAMUSCULAR | Status: DC | PRN
  Administered 2013-02-21 (×2): 40 ug via INTRAVENOUS

## 2013-02-21 MED ORDER — DEXTROSE-NACL 5-0.45 % IV SOLN
INTRAVENOUS | Status: DC
  Administered 2013-02-21: 100 mL/h via INTRAVENOUS
  Administered 2013-02-22 (×2): via INTRAVENOUS

## 2013-02-21 MED ORDER — MIDAZOLAM HCL 5 MG/5ML IJ SOLN
INTRAMUSCULAR | Status: DC | PRN
  Administered 2013-02-21: 2 mg via INTRAVENOUS

## 2013-02-21 MED ORDER — 0.9 % SODIUM CHLORIDE (POUR BTL) OPTIME
TOPICAL | Status: DC | PRN
  Administered 2013-02-21: 2000 mL

## 2013-02-21 MED ORDER — PROPOFOL 10 MG/ML IV BOLUS
INTRAVENOUS | Status: DC | PRN
  Administered 2013-02-21: 50 mg via INTRAVENOUS
  Administered 2013-02-21: 150 mg via INTRAVENOUS
  Administered 2013-02-21 (×2): 50 mg via INTRAVENOUS

## 2013-02-21 MED ORDER — TESTOSTERONE CYPIONATE 100 MG/ML IM SOLN
100.0000 mg | INTRAMUSCULAR | Status: DC
  Filled 2013-02-21: qty 1

## 2013-02-21 MED ORDER — NEOSTIGMINE METHYLSULFATE 1 MG/ML IJ SOLN
INTRAMUSCULAR | Status: DC | PRN
  Administered 2013-02-21: 5 mg via INTRAVENOUS

## 2013-02-21 MED ORDER — STERILE WATER FOR IRRIGATION IR SOLN
Status: DC | PRN
  Administered 2013-02-21: 200 mL via INTRAVESICAL

## 2013-02-21 MED ORDER — OXYCODONE HCL 5 MG/5ML PO SOLN: 5.0000 mg | Freq: Once | ORAL | Status: DC | PRN

## 2013-02-21 MED ORDER — GABAPENTIN 300 MG PO CAPS: 600.0000 mg | ORAL_CAPSULE | Freq: Two times a day (BID) | ORAL | Status: DC

## 2013-02-21 MED ORDER — OXYCODONE HCL 5 MG PO TABS: 5.0000 mg | ORAL_TABLET | ORAL | Status: AC | PRN

## 2013-02-21 MED ORDER — LEVALBUTEROL HCL 0.63 MG/3ML IN NEBU
0.6300 mg | INHALATION_SOLUTION | Freq: Four times a day (QID) | RESPIRATORY_TRACT | Status: DC
  Administered 2013-02-21 – 2013-02-22 (×4): 0.63 mg via RESPIRATORY_TRACT
  Filled 2013-02-21 (×8): qty 3

## 2013-02-21 MED ORDER — ONDANSETRON HCL 4 MG/2ML IJ SOLN
INTRAMUSCULAR | Status: DC | PRN
  Administered 2013-02-21: 4 mg via INTRAVENOUS

## 2013-02-21 MED ORDER — VITAMIN D-3 25 MCG (1000 UT) PO CAPS: 1000.0000 [IU] | ORAL_CAPSULE | Freq: Every day | ORAL | Status: DC

## 2013-02-21 MED ORDER — PANTOPRAZOLE SODIUM 40 MG PO TBEC
80.0000 mg | DELAYED_RELEASE_TABLET | Freq: Every day | ORAL | Status: DC
  Administered 2013-02-22 – 2013-02-26 (×5): 80 mg via ORAL
  Filled 2013-02-21: qty 2
  Filled 2013-02-21: qty 1
  Filled 2013-02-21 (×3): qty 2

## 2013-02-21 MED ORDER — ACETAMINOPHEN 10 MG/ML IV SOLN
1000.0000 mg | Freq: Four times a day (QID) | INTRAVENOUS | Status: AC
  Administered 2013-02-21 – 2013-02-22 (×4): 1000 mg via INTRAVENOUS
  Filled 2013-02-21 (×4): qty 100

## 2013-02-21 MED ORDER — VECURONIUM BROMIDE 10 MG IV SOLR
INTRAVENOUS | Status: DC | PRN
  Administered 2013-02-21: 3 mg via INTRAVENOUS
  Administered 2013-02-21: 1 mg via INTRAVENOUS
  Administered 2013-02-21: 2 mg via INTRAVENOUS
  Administered 2013-02-21 (×2): 1 mg via INTRAVENOUS

## 2013-02-21 MED ORDER — OXYCODONE-ACETAMINOPHEN 5-325 MG PO TABS
1.0000 | ORAL_TABLET | ORAL | Status: DC | PRN
  Administered 2013-02-23 – 2013-02-25 (×7): 2 via ORAL
  Filled 2013-02-21 (×8): qty 2

## 2013-02-21 MED ORDER — LITHIUM CARBONATE ER 300 MG PO TBCR
300.0000 mg | EXTENDED_RELEASE_TABLET | Freq: Every day | ORAL | Status: DC
  Administered 2013-02-21 – 2013-02-25 (×5): 300 mg via ORAL
  Filled 2013-02-21 (×6): qty 1

## 2013-02-21 MED ORDER — ACETAMINOPHEN 10 MG/ML IV SOLN: 1000.0000 mg | Freq: Once | INTRAVENOUS | Status: DC

## 2013-02-21 MED ORDER — HYDROMORPHONE HCL PF 1 MG/ML IJ SOLN
0.2500 mg | INTRAMUSCULAR | Status: DC | PRN
  Administered 2013-02-21: 0.5 mg via INTRAVENOUS

## 2013-02-21 MED ORDER — BUPIVACAINE 0.25 % ON-Q PUMP SINGLE CATH 300ML
300.0000 mL | INJECTION | Status: DC
  Filled 2013-02-21: qty 300

## 2013-02-21 MED ORDER — DEXTROSE 5 % IV SOLN
1.5000 g | INTRAVENOUS | Status: DC | PRN
  Administered 2013-02-21: 1.5 g via INTRAVENOUS

## 2013-02-21 MED ORDER — METHOTREXATE 2.5 MG PO TABS
15.0000 mg | ORAL_TABLET | ORAL | Status: DC
  Filled 2013-02-21: qty 6

## 2013-02-21 MED ORDER — POTASSIUM CHLORIDE 10 MEQ/50ML IV SOLN: 10.0000 meq | Freq: Every day | INTRAVENOUS | Status: DC | PRN

## 2013-02-21 MED ORDER — SODIUM CHLORIDE 0.9 % IJ SOLN: 9.0000 mL | INTRAMUSCULAR | Status: DC | PRN

## 2013-02-21 MED ORDER — LIDOCAINE HCL 2 % EX GEL
CUTANEOUS | Status: DC | PRN
  Administered 2013-02-21: 1 via URETHRAL

## 2013-02-21 MED ORDER — ACETAMINOPHEN 10 MG/ML IV SOLN
INTRAVENOUS | Status: DC | PRN
  Administered 2013-02-21: 1000 mg via INTRAVENOUS

## 2013-02-21 MED ORDER — ONDANSETRON HCL 4 MG/2ML IJ SOLN: 4.0000 mg | Freq: Four times a day (QID) | INTRAMUSCULAR | Status: DC | PRN

## 2013-02-21 MED ORDER — VITAMIN D3 25 MCG (1000 UNIT) PO TABS
1000.0000 [IU] | ORAL_TABLET | Freq: Every day | ORAL | Status: DC
  Administered 2013-02-22 – 2013-02-26 (×5): 1000 [IU] via ORAL
  Filled 2013-02-21 (×6): qty 1

## 2013-02-21 MED ORDER — DIPHENHYDRAMINE HCL 50 MG/ML IJ SOLN: 12.5000 mg | Freq: Four times a day (QID) | INTRAMUSCULAR | Status: DC | PRN

## 2013-02-21 MED ORDER — ASPIRIN EC 81 MG PO TBEC
81.0000 mg | DELAYED_RELEASE_TABLET | Freq: Every day | ORAL | Status: DC
  Administered 2013-02-23 – 2013-02-26 (×4): 81 mg via ORAL
  Filled 2013-02-21 (×5): qty 1

## 2013-02-21 MED ORDER — FENTANYL 10 MCG/ML IV SOLN
INTRAVENOUS | Status: DC
  Administered 2013-02-21: 50 ug via INTRAVENOUS
  Administered 2013-02-21 – 2013-02-22 (×2): via INTRAVENOUS
  Administered 2013-02-22: 30 ug via INTRAVENOUS
  Administered 2013-02-22: 50 ug via INTRAVENOUS
  Administered 2013-02-22: 200 ug via INTRAVENOUS
  Administered 2013-02-22: 290 ug via INTRAVENOUS
  Administered 2013-02-22: 80 ug via INTRAVENOUS
  Administered 2013-02-22: 20 ug via INTRAVENOUS
  Administered 2013-02-22: 200 ug via INTRAVENOUS
  Administered 2013-02-23: 02:00:00 via INTRAVENOUS
  Administered 2013-02-23: 120 ug via INTRAVENOUS
  Administered 2013-02-23: 98.74 ug via INTRAVENOUS
  Administered 2013-02-25: 40 ug via INTRAVENOUS
  Administered 2013-02-25: 20 ug via INTRAVENOUS
  Administered 2013-02-25: 130 ug via INTRAVENOUS
  Administered 2013-02-26: 20 ug via INTRAVENOUS
  Filled 2013-02-21 (×4): qty 50

## 2013-02-21 MED ORDER — FOLIC ACID 1 MG PO TABS
1.0000 mg | ORAL_TABLET | Freq: Every day | ORAL | Status: DC
  Administered 2013-02-22 – 2013-02-26 (×5): 1 mg via ORAL
  Filled 2013-02-21 (×5): qty 1

## 2013-02-21 MED ORDER — HYDROMORPHONE HCL PF 1 MG/ML IJ SOLN
INTRAMUSCULAR | Status: AC
  Administered 2013-02-21: 0.5 mg via INTRAVENOUS
  Filled 2013-02-21: qty 1

## 2013-02-21 MED ORDER — COLESEVELAM HCL 625 MG PO TABS
3750.0000 mg | ORAL_TABLET | Freq: Every day | ORAL | Status: DC
  Administered 2013-02-22: 3750 mg via ORAL
  Administered 2013-02-23: 1875 mg via ORAL
  Administered 2013-02-24 – 2013-02-25 (×2): 3750 mg via ORAL
  Filled 2013-02-21 (×6): qty 6

## 2013-02-21 MED ORDER — GABAPENTIN 600 MG PO TABS
600.0000 mg | ORAL_TABLET | Freq: Every morning | ORAL | Status: DC
  Administered 2013-02-22 – 2013-02-26 (×5): 600 mg via ORAL
  Filled 2013-02-21 (×5): qty 1

## 2013-02-21 MED ORDER — BISACODYL 5 MG PO TBEC
10.0000 mg | DELAYED_RELEASE_TABLET | Freq: Every day | ORAL | Status: DC
  Administered 2013-02-22 – 2013-02-26 (×5): 10 mg via ORAL
  Filled 2013-02-21 (×5): qty 2

## 2013-02-21 MED ORDER — HEMOSTATIC AGENTS (NO CHARGE) OPTIME
TOPICAL | Status: DC | PRN
  Administered 2013-02-21: 1 via TOPICAL

## 2013-02-21 MED ORDER — LITHIUM CARBONATE 300 MG PO TABS
300.0000 mg | ORAL_TABLET | Freq: Every day | ORAL | Status: DC
Start: 2013-02-21 — End: 2013-02-21

## 2013-02-21 MED ORDER — GABAPENTIN 300 MG PO CAPS
900.0000 mg | ORAL_CAPSULE | Freq: Every day | ORAL | Status: DC
  Administered 2013-02-21: 900 mg via ORAL
  Administered 2013-02-22: 300 mg via ORAL
  Administered 2013-02-23 – 2013-02-25 (×3): 900 mg via ORAL
  Filled 2013-02-21 (×6): qty 3

## 2013-02-21 SURGICAL SUPPLY — 88 items
APPLICATOR TIP COSEAL (VASCULAR PRODUCTS) IMPLANT
APPLICATOR TIP EXT COSEAL (VASCULAR PRODUCTS) IMPLANT
BLADE SURG 11 STRL SS (BLADE) IMPLANT
BRUSH CYTOL CELLEBRITY 1.5X140 (MISCELLANEOUS) IMPLANT
CANISTER SUCTION 2500CC (MISCELLANEOUS) ×4 IMPLANT
CATH COUDE FOLEY 2W 5CC 16FR (CATHETERS) ×4 IMPLANT
CATH COUDE FOLEY 5CC 14FR (CATHETERS) ×4 IMPLANT
CATH FOLEY 2W COUNCIL 5CC 16FR (CATHETERS) ×4 IMPLANT
CATH FOLEY LATEX FREE 16FR (CATHETERS) ×1
CATH FOLEY LF 16FR (CATHETERS) ×3 IMPLANT
CATH KIT ON Q 5IN SLV (PAIN MANAGEMENT) ×4 IMPLANT
CATH THORACIC 28FR (CATHETERS) ×8 IMPLANT
CATH THORACIC 36FR (CATHETERS) IMPLANT
CATH THORACIC 36FR RT ANG (CATHETERS) IMPLANT
CLIP TI MEDIUM 6 (CLIP) ×4 IMPLANT
CLOTH BEACON ORANGE TIMEOUT ST (SAFETY) ×4 IMPLANT
CONT SPEC 4OZ CLIKSEAL STRL BL (MISCELLANEOUS) ×8 IMPLANT
COVER TABLE BACK 60X90 (DRAPES) ×4 IMPLANT
DERMABOND ADHESIVE PROPEN (GAUZE/BANDAGES/DRESSINGS) ×1
DERMABOND ADVANCED .7 DNX6 (GAUZE/BANDAGES/DRESSINGS) ×3 IMPLANT
DRAPE LAPAROSCOPIC ABDOMINAL (DRAPES) ×4 IMPLANT
DRAPE WARM FLUID 44X44 (DRAPE) ×4 IMPLANT
DRILL BIT 7/64X5 (BIT) ×4 IMPLANT
ELECT BLADE 4.0 EZ CLEAN MEGAD (MISCELLANEOUS) ×12
ELECT REM PT RETURN 9FT ADLT (ELECTROSURGICAL) ×4
ELECTRODE BLDE 4.0 EZ CLN MEGD (MISCELLANEOUS) ×9 IMPLANT
ELECTRODE REM PT RTRN 9FT ADLT (ELECTROSURGICAL) ×3 IMPLANT
FORCEPS BIOP RJ4 1.8 (CUTTING FORCEPS) IMPLANT
GLOVE BIO SURGEON STRL SZ 6.5 (GLOVE) ×20 IMPLANT
GLOVE BIO SURGEON STRL SZ7.5 (GLOVE) ×4 IMPLANT
GLOVE BIOGEL PI IND STRL 6.5 (GLOVE) ×12 IMPLANT
GLOVE BIOGEL PI INDICATOR 6.5 (GLOVE) ×4
GLOVE SS N UNI LF 6.5 STRL (GLOVE) ×4 IMPLANT
GOWN STRL NON-REIN LRG LVL3 (GOWN DISPOSABLE) ×16 IMPLANT
GUIDEWIRE STR DUAL SENSOR (WIRE) ×4 IMPLANT
KIT BASIN OR (CUSTOM PROCEDURE TRAY) ×4 IMPLANT
KIT ROOM TURNOVER OR (KITS) ×4 IMPLANT
KIT SUCTION CATH 14FR (SUCTIONS) ×4 IMPLANT
MARKER SKIN DUAL TIP RULER LAB (MISCELLANEOUS) ×4 IMPLANT
NEEDLE BIOPSY TRANSBRONCH 21G (NEEDLE) IMPLANT
NS IRRIG 1000ML POUR BTL (IV SOLUTION) ×8 IMPLANT
OIL SILICONE PENTAX (PARTS (SERVICE/REPAIRS)) ×4 IMPLANT
PACK CHEST (CUSTOM PROCEDURE TRAY) ×4 IMPLANT
PAD ARMBOARD 7.5X6 YLW CONV (MISCELLANEOUS) ×12 IMPLANT
PENCIL BUTTON HOLSTER BLD 10FT (ELECTRODE) ×4 IMPLANT
POUCH ENDO CATCH II 15MM (MISCELLANEOUS) ×4 IMPLANT
RELOAD EGIA 45 TAN VASC (STAPLE) ×8 IMPLANT
RELOAD EGIA 60 MED/THCK PURPLE (STAPLE) ×20 IMPLANT
RELOAD EGIA BLACK ROTIC 45MM (STAPLE) ×4 IMPLANT
RELOAD EGIA TRIS TAN 45 CVD (STAPLE) ×16 IMPLANT
SCISSORS LAP 5X35 DISP (ENDOMECHANICALS) IMPLANT
SEALANT PROGEL (MISCELLANEOUS) ×4 IMPLANT
SEALANT SURG COSEAL 4ML (VASCULAR PRODUCTS) IMPLANT
SEALANT SURG COSEAL 8ML (VASCULAR PRODUCTS) IMPLANT
SOLUTION ANTI FOG 6CC (MISCELLANEOUS) ×4 IMPLANT
SPONGE GAUZE 4X4 12PLY (GAUZE/BANDAGES/DRESSINGS) ×8 IMPLANT
SPONGE INTESTINAL PEANUT (DISPOSABLE) ×4 IMPLANT
SUT PROLENE 3 0 SH DA (SUTURE) IMPLANT
SUT PROLENE 4 0 RB 1 (SUTURE)
SUT PROLENE 4-0 RB1 .5 CRCL 36 (SUTURE) IMPLANT
SUT SILK  1 MH (SUTURE) ×4
SUT SILK 1 MH (SUTURE) ×12 IMPLANT
SUT SILK 2 0 SH (SUTURE) ×8 IMPLANT
SUT SILK 2 0SH CR/8 30 (SUTURE) IMPLANT
SUT SILK 3 0SH CR/8 30 (SUTURE) IMPLANT
SUT STEEL 1 (SUTURE) IMPLANT
SUT VIC AB 1 CTX 18 (SUTURE) ×4 IMPLANT
SUT VIC AB 1 CTX 36 (SUTURE) ×1
SUT VIC AB 1 CTX36XBRD ANBCTR (SUTURE) ×3 IMPLANT
SUT VIC AB 2-0 CTX 36 (SUTURE) ×4 IMPLANT
SUT VIC AB 2-0 UR6 27 (SUTURE) IMPLANT
SUT VIC AB 3-0 SH 8-18 (SUTURE) IMPLANT
SUT VIC AB 3-0 X1 27 (SUTURE) ×4 IMPLANT
SUT VICRYL 2 TP 1 (SUTURE) ×4 IMPLANT
SWAB COLLECTION DEVICE MRSA (MISCELLANEOUS) IMPLANT
SYR 20ML ECCENTRIC (SYRINGE) ×4 IMPLANT
SYSTEM SAHARA CHEST DRAIN ATS (WOUND CARE) ×4 IMPLANT
TAPE CLOTH SURG 4X10 WHT LF (GAUZE/BANDAGES/DRESSINGS) ×4 IMPLANT
TIP APPLICATOR SPRAY EXTEND 16 (VASCULAR PRODUCTS) IMPLANT
TOWEL OR 17X24 6PK STRL BLUE (TOWEL DISPOSABLE) ×8 IMPLANT
TOWEL OR 17X26 10 PK STRL BLUE (TOWEL DISPOSABLE) ×8 IMPLANT
TRAP SPECIMEN MUCOUS 40CC (MISCELLANEOUS) ×4 IMPLANT
TRAY FOLEY CATH 14FRSI W/METER (CATHETERS) ×4 IMPLANT
TUBE ANAEROBIC SPECIMEN COL (MISCELLANEOUS) IMPLANT
TUBE CONNECTING 12X1/4 (SUCTIONS) ×8 IMPLANT
TUBING CYSTO DISP (UROLOGICAL SUPPLIES) ×4 IMPLANT
TUNNELER SHEATH ON-Q 11GX8 (MISCELLANEOUS) ×8 IMPLANT
WATER STERILE IRR 1000ML POUR (IV SOLUTION) ×8 IMPLANT

## 2013-02-21 NOTE — Anesthesia Postprocedure Evaluation (Signed)
Anesthesia Post Note  Patient: Alvin Hardy  Procedure(s) Performed: Procedure(s) (LRB): Right VIDEO ASSISTED THORACOSCOPY ,Thoracotomy with right upper lobectomy, node sampling  (Right) VIDEO BRONCHOSCOPY (N/A) CYSTOSCOPY FLEXIBLE with insertion of foley catheter (N/A)  Anesthesia type: General  Patient location: PACU  Post pain: Pain level controlled and Adequate analgesia  Post assessment: Post-op Vital signs reviewed, Patient's Cardiovascular Status Stable, Respiratory Function Stable, Patent Airway and Pain level controlled  Last Vitals:  Filed Vitals:   02/21/13 1350  BP: 127/66  Pulse: 90  Temp:   Resp: 18    Post vital signs: Reviewed and stable  Level of consciousness: awake, alert  and oriented  Complications: No apparent anesthesia complications

## 2013-02-21 NOTE — Anesthesia Preprocedure Evaluation (Signed)
Anesthesia Evaluation  Patient identified by MRN, date of birth, ID band Patient awake    Reviewed: Allergy & Precautions, H&P , NPO status , Patient's Chart, lab work & pertinent test results  Airway Mallampati: II  Neck ROM: full    Dental   Pulmonary shortness of breath,  Lung CA         Cardiovascular     Neuro/Psych PSYCHIATRIC DISORDERS Bipolar Disorder  Neuromuscular disease    GI/Hepatic GERD-  ,  Endo/Other    Renal/GU      Musculoskeletal  (+) Arthritis -,   Abdominal   Peds  Hematology   Anesthesia Other Findings   Reproductive/Obstetrics                           Anesthesia Physical Anesthesia Plan  ASA: III  Anesthesia Plan: General   Post-op Pain Management:    Induction: Intravenous  Airway Management Planned: Double Lumen EBT  Additional Equipment: Arterial line and CVP  Intra-op Plan:   Post-operative Plan: Extubation in OR  Informed Consent: I have reviewed the patients History and Physical, chart, labs and discussed the procedure including the risks, benefits and alternatives for the proposed anesthesia with the patient or authorized representative who has indicated his/her understanding and acceptance.     Plan Discussed with: CRNA, Surgeon and Anesthesiologist  Anesthesia Plan Comments:         Anesthesia Quick Evaluation

## 2013-02-21 NOTE — Progress Notes (Signed)
S/p Right upper lobectomy  BP 120/74  Pulse 82  Temp(Src) 98 F (36.7 C) (Oral)  Resp 17  SpO2 97%   Intake/Output Summary (Last 24 hours) at 02/21/13 1720 Last data filed at 02/21/13 1600  Gross per 24 hour  Intake 738.33 ml  Output    568 ml  Net 170.33 ml    Pain well controlled  Minimal drainage and minimal air leak

## 2013-02-21 NOTE — Consult Note (Signed)
Consult requested by: Delight Ovens, MD Dx: BPH, difficult foley  History of Present Illness: Patient is undergoing surgical resection of Right Upper Lobe of lung for diagnosis and treatment purposes. He was asleep in the OR and several attempts at Foley catheter were made and were unsuccessful.  The patient was recently evaluated in the office by Dr. Retta Diones with CT scanning and cystoscopy. On cystoscopy he had nodular regrowth of his prostatic urethra. History of prior TURP in the late 90s.  I reviewed his Epic and Allscripts chart.   Past Medical History  Diagnosis Date  . BPH (benign prostatic hyperplasia)   . Hypogonadism male   . Bipolar 1 disorder   . ED (erectile dysfunction)   . Hyperlipidemia   . Neuromuscular disorder     peripheral neuropathy  . Cataract 2010    Bilateral  . Laryngopharyngeal reflux   . Gilbert's syndrome   . Deviated septum     TO THE LEFT  . Morton's neuroma     LEFT FOOT  . Lung mass 02/07/13    RIGHT UPPER LOBE  . Shortness of breath     voice breaks, ? related to reflux, although told by Dr. Christella Hartigan, S.- early 35 yr. old  . GERD (gastroesophageal reflux disease)   . Cancer     lung  . Arthritis     psoriatic arthritis, ? , treated /w mmethotrexate   . Finger injury     mallet finger-- 02/13/2013, cast in place, followed by dr. Merlyn Lot   Past Surgical History  Procedure Laterality Date  . Prostate surgery  1996    TURP  . Knee arthroscopy Right 01/2007  . Pilonidal cyst excision  1960's  . Eye surgery      cataracts removed fr. both eyes, IOL in place     Home Medications:  Prescriptions prior to admission  Medication Sig Dispense Refill  . calcipotriene (DOVONOX) 0.005 % cream Apply 1 application topically daily as needed (for hands).       . Calcium Carbonate (CALCIUM 600 PO) Take 1 tablet by mouth daily.      . Cholecalciferol (VITAMIN D-3) 1000 UNITS CAPS Take 1,000 Units by mouth daily.       . clobetasol cream (TEMOVATE)  0.05 % Apply 1 application topically daily.       . clonazePAM (KLONOPIN) 0.5 MG tablet Take 0.5 mg by mouth at bedtime.      . colesevelam (WELCHOL) 625 MG tablet Take 3 mg by mouth daily after supper. Takes following the largest meal of the day.      . cyanocobalamin 1000 MCG tablet Take 1,000 mcg by mouth daily.      Marland Kitchen esomeprazole (NEXIUM) 40 MG capsule Take 40 mg by mouth daily before breakfast.      . finasteride (PROSCAR) 5 MG tablet Take 5 mg by mouth daily before breakfast.       . folic acid (FOLVITE) 1 MG tablet Take 1 mg by mouth daily.      Marland Kitchen gabapentin (NEURONTIN) 300 MG capsule Take 600-900 mg by mouth 2 (two) times daily. Takes 2 capsules every morning, 300mg  x3 at night      . glucosamine-chondroitin 500-400 MG tablet Take 1 tablet by mouth 2 (two) times daily.       Marland Kitchen lithium 300 MG tablet Take 300 mg by mouth at bedtime.      . methotrexate (RHEUMATREX) 2.5 MG tablet Take 15 mg by mouth every Monday. Caution:Chemotherapy.  Protect from light. Take 6 tabs once a week (Monday) Last dose of Methotrexate taken at 02/19/2013      . Multiple Vitamins-Minerals (MULTIVITAMIN PO) Take 2 tablets by mouth daily.       . propranolol (INDERAL) 10 MG tablet Take 10-20 mg by mouth daily as needed (Takes rarely, only under extremely stressful circumstances.).       Marland Kitchen testosterone cypionate (DEPOTESTOTERONE CYPIONATE) 100 MG/ML injection Inject 100 mg into the muscle every Friday. For IM use only      . vardenafil (LEVITRA) 5 MG tablet Take 5 mg by mouth daily as needed for erectile dysfunction.       Marland Kitchen aspirin EC 81 MG tablet Take 81 mg by mouth daily.       Allergies:  Allergies  Allergen Reactions  . Codeine Anaphylaxis  . Other     Pt. Remarks that all pain med. Make him nauseated   . Zoloft (Sertraline Hcl) Other (See Comments)    REACTION:  unknown    Family History  Problem Relation Age of Onset  . Diabetes Mother   . Heart disease Father   . Diabetes Son    Social History:   reports that he quit smoking about 34 years ago. His smoking use included Cigarettes. He smoked 0.00 packs per day. He does not have any smokeless tobacco history on file. He reports that  drinks alcohol. He reports that he does not use illicit drugs.  ROS: A complete review of systems was unattainable.   Physical Exam:  Vital signs in last 24 hours: Temp:  [97.6 F (36.4 C)-99 F (37.2 C)] 97.6 F (36.4 C) (04/23 0647) Pulse Rate:  [69-93] 69 (04/23 0647) Resp:  [20] 20 (04/23 0647) BP: (132-134)/(85-86) 132/86 mmHg (04/23 0647) SpO2:  [95 %-98 %] 98 % (04/23 0647) Weight:  [83.915 kg (185 lb)] 83.915 kg (185 lb) (04/22 1501) General:  Patient asleep, intubated in the operating room GU: Blood at meatus  Laboratory Data:  Results for orders placed during the hospital encounter of 02/20/13 (from the past 24 hour(s))  APTT     Status: None   Collection Time    02/20/13  3:38 PM      Result Value Range   aPTT 27  24 - 37 seconds  BLOOD GAS, ARTERIAL     Status: Abnormal   Collection Time    02/20/13  3:38 PM      Result Value Range   FIO2 0.21     Delivery systems ROOM AIR     pH, Arterial 7.428  7.350 - 7.450   pCO2 arterial 34.9 (*) 35.0 - 45.0 mmHg   pO2, Arterial 86.3  80.0 - 100.0 mmHg   Bicarbonate 22.6  20.0 - 24.0 mEq/L   TCO2 23.7  0 - 100 mmol/L   Acid-base deficit 1.1  0.0 - 2.0 mmol/L   O2 Saturation 98.2     Patient temperature 98.6     Collection site LEFT RADIAL     Drawn by [id number]     Sample type ARTERIAL DRAW     Allens test (pass/fail) PASS  PASS  CBC     Status: Abnormal   Collection Time    02/20/13  3:38 PM      Result Value Range   WBC 10.6 (*) 4.0 - 10.5 K/uL   RBC 4.98  4.22 - 5.81 MIL/uL   Hemoglobin 16.0  13.0 - 17.0 g/dL   HCT 04.5  40.9 -  52.0 %   MCV 88.6  78.0 - 100.0 fL   MCH 32.1  26.0 - 34.0 pg   MCHC 36.3 (*) 30.0 - 36.0 g/dL   RDW 45.4  09.8 - 11.9 %   Platelets 193  150 - 400 K/uL  COMPREHENSIVE METABOLIC PANEL     Status:  Abnormal   Collection Time    02/20/13  3:38 PM      Result Value Range   Sodium 137  135 - 145 mEq/L   Potassium 4.3  3.5 - 5.1 mEq/L   Chloride 105  96 - 112 mEq/L   CO2 21  19 - 32 mEq/L   Glucose, Bld 127 (*) 70 - 99 mg/dL   BUN 20  6 - 23 mg/dL   Creatinine, Ser 1.47  0.50 - 1.35 mg/dL   Calcium 9.3  8.4 - 82.9 mg/dL   Total Protein 6.7  6.0 - 8.3 g/dL   Albumin 4.0  3.5 - 5.2 g/dL   AST 26  0 - 37 U/L   ALT 23  0 - 53 U/L   Alkaline Phosphatase 53  39 - 117 U/L   Total Bilirubin 0.9  0.3 - 1.2 mg/dL   GFR calc non Af Amer 70 (*) >90 mL/min   GFR calc Af Amer 81 (*) >90 mL/min  PROTIME-INR     Status: None   Collection Time    02/20/13  3:38 PM      Result Value Range   Prothrombin Time 13.0  11.6 - 15.2 seconds   INR 0.99  0.00 - 1.49  URINALYSIS, ROUTINE W REFLEX MICROSCOPIC     Status: None   Collection Time    02/20/13  3:43 PM      Result Value Range   Color, Urine YELLOW  YELLOW   APPearance CLEAR  CLEAR   Specific Gravity, Urine 1.022  1.005 - 1.030   pH 5.5  5.0 - 8.0   Glucose, UA NEGATIVE  NEGATIVE mg/dL   Hgb urine dipstick NEGATIVE  NEGATIVE   Bilirubin Urine NEGATIVE  NEGATIVE   Ketones, ur NEGATIVE  NEGATIVE mg/dL   Protein, ur NEGATIVE  NEGATIVE mg/dL   Urobilinogen, UA 0.2  0.0 - 1.0 mg/dL   Nitrite NEGATIVE  NEGATIVE   Leukocytes, UA NEGATIVE  NEGATIVE  SURGICAL PCR SCREEN     Status: None   Collection Time    02/20/13  3:44 PM      Result Value Range   MRSA, PCR NEGATIVE  NEGATIVE   Staphylococcus aureus NEGATIVE  NEGATIVE  TYPE AND SCREEN     Status: None   Collection Time    02/20/13  4:00 PM      Result Value Range   ABO/RH(D) A POS     Antibody Screen NEG     Sample Expiration 02/23/2013    ABO/RH     Status: None   Collection Time    02/20/13  4:00 PM      Result Value Range   ABO/RH(D) A POS     Recent Results (from the past 240 hour(s))  SURGICAL PCR SCREEN     Status: None   Collection Time    02/20/13  3:44 PM       Result Value Range Status   MRSA, PCR NEGATIVE  NEGATIVE Final   Staphylococcus aureus NEGATIVE  NEGATIVE Final   Comment:            The Xpert SA Assay (FDA  approved for NASAL specimens     in patients over 38 years of age),     is one component of     a comprehensive surveillance     program.  Test performance has     been validated by [employer] for patients greater     than or equal to 92 year old.     It is not intended     to diagnose infection nor to     guide or monitor treatment.   Creatinine:  Recent Labs  02/20/13 1538  CREATININE 1.02    Impression/Assessment:  BPH Difficult Foley  Plan:  -Patient will require cystoscopy and Foley placement see separate op note. -Continue Foley catheter postop until patient is fully ambulatory and able to void standing. -Followup with Dr. Retta Diones planned in 6 months.  Antony Haste 02/21/2013, 10:14 AM

## 2013-02-21 NOTE — H&P (Signed)
[address]       [place] [address]             [phone]        SILVESTER REIERSON [facility] Medical Record #[id number] Date of Birth: Nov 12, 1934  Referring: No ref. provider found Primary Care: [name], MD  Chief Complaint:    Lung nodule-right   History of Present Illness:    78 yo male who noticed voice breaks. Treated with nexium as poss reflux. Because of persistent trouble a  chest xray was done, with finding of lung nodule. Patient has history of smoking, quit in 1980. Patient denies hemoptysis, wheezing.  He denies history of MI or previous cardiac history      Current Activity/ Functional Status:  Patient is independent with mobility/ambulation, transfers, ADL's, IADL's.  Zubrod Score: At the time of surgery this patient's most appropriate activity status/level should be described as: []  Normal activity, no symptoms [x]  Symptoms, fully ambulatory []  Symptoms, in bed less than or equal to 50% of the time []  Symptoms, in bed greater than 50% of the time but less than 100% []  Bedridden []  Moribund   Past Medical History  Diagnosis Date  . BPH (benign prostatic hyperplasia)   . Hypogonadism male   . Bipolar 1 disorder   . ED (erectile dysfunction)   . Hyperlipidemia   . Neuromuscular disorder     peripheral neuropathy  . Cataract 2010    Bilateral  . Laryngopharyngeal reflux   . Gilbert's syndrome   . Deviated septum     TO THE LEFT  . Morton's neuroma     LEFT FOOT  . Lung mass 02/07/13    RIGHT UPPER LOBE  . Shortness of breath     voice breaks, ? related to reflux, although told by Dr. Christella Hartigan, S.- early 89 yr. old  . GERD (gastroesophageal reflux disease)   . Cancer     lung  . Arthritis     psoriatic arthritis, ? , treated /w mmethotrexate   . Finger injury     mallet finger-- 02/13/2013, cast in place, followed by dr. Merlyn Lot    Past Surgical History  Procedure Laterality Date  . Prostate surgery   1996    TURP  . Knee arthroscopy Right 01/2007  . Pilonidal cyst excision  1960's  . Eye surgery      cataracts removed fr. both eyes, IOL in place     Family History  Problem Relation Age of Onset  . Diabetes Mother   . Heart disease Father   . Diabetes Son     History   Social History  . Marital Status: Married    Spouse Name: N/A    Number of Children: N/A  . Years of Education: N/A   Occupational History  . Retired Biomedical scientist   Social History Main Topics  . Smoking status: Former Smoker    Types: Cigarettes    Quit date: 11/01/1978  . Smokeless tobacco: Not on file  . Alcohol Use: Yes  . Drug Use: No  . Sexually Active: Not on file   Other Topics Concern  . Not on file   Social History Narrative  . No narrative on file    History  Smoking status  . Former Smoker  . Types: Cigarettes  . Quit date: 11/01/1978  Smokeless tobacco  . Not on  file    History  Alcohol Use  . Yes    Comment: occas.     Allergies  Allergen Reactions  . Codeine Anaphylaxis  . Other     Pt. Remarks that all pain med. Make him nauseated   . Zoloft (Sertraline Hcl) Other (See Comments)    REACTION:  unknown    Current Facility-Administered Medications  Medication Dose Route Frequency Provider Last Rate Last Dose  . cefUROXime (ZINACEF) 1.5 g in dextrose 5 % 50 mL IVPB  1.5 g Intravenous 60 min Pre-Op Delight Ovens, MD      . HYDROmorphone (DILAUDID) injection 0.25-0.5 mg  0.25-0.5 mg Intravenous Q5 min PRN Raiford Simmonds, MD      . ondansetron (ZOFRAN) injection 4 mg  4 mg Intravenous Q6H PRN Raiford Simmonds, MD      . oxyCODONE (Oxy IR/ROXICODONE) immediate release tablet 5 mg  5 mg Oral Once PRN Raiford Simmonds, MD       Or  . oxyCODONE (ROXICODONE) 5 MG/5ML solution 5 mg  5 mg Oral Once PRN Raiford Simmonds, MD       Facility-Administered Medications Ordered in Other Encounters  Medication Dose Route Frequency Provider Last Rate Last Dose  . cefUROXime  (ZINACEF) 1.5 g in dextrose 5 % 50 mL IVPB  1.5 g  Continuous PRN Sheppard Evens, CRNA   1.5 g at 02/21/13 0850  . fentaNYL (SUBLIMAZE) injection    PRN Sheppard Evens, CRNA   50 mcg at 02/21/13 0839  . midazolam (VERSED) 5 MG/5ML injection    PRN Sheppard Evens, CRNA   2 mg at 02/21/13 0755  . phenylephrine (NEO-SYNEPHRINE) injection    PRN Sheppard Evens, CRNA   40 mcg at 02/21/13 0848  . propofol (DIPRIVAN) 10 mg/mL bolus/IV push    PRN Sheppard Evens, CRNA   50 mg at 02/21/13 0844  . rocuronium (ZEMURON) injection    PRN Sheppard Evens, CRNA   50 mg at 02/21/13 1610       Review of Systems:     Cardiac Review of Systems: Y or N  Chest Pain [  n  ]  Resting SOB [ n  ] Exertional SOB  [ y ]  Myer Peer  ]   Pedal Edema [  n ]    Palpitations [n  ] Syncope  [n  ]   Presyncope [ n  ]  General Review of Systems: [Y] = yes [  ]=no Constitional: recent weight change [n  ]; anorexia [  ]; fatigue [ n ]; nausea [  ]; night sweats [  ]; fever [ n ]; or chills [n  ];  Dental: poor dentition[  n]; Last Dentist visit:6 months   Eye : blurred vision [  ]; diplopia [   ]; vision changes [  ];  Amaurosis fugax[  ]; Resp: cough [  ];  wheezing[  ];  hemoptysis[ n ]; shortness of breath[  ]; paroxysmal nocturnal dyspnea[  ]; dyspnea on exertion[  ]; or orthopnea[  ];  GI:  gallstones[  ], vomiting[  ];  dysphagia[  ]; melena[  ];  hematochezia [  ]; heartburn[  ];   Hx of  Colonoscopy[  ]; GU: kidney stones [  ]; hematuria[  ];   dysuria [  ];  nocturia[  ];  history of     obstruction [  ]; urinary frequency [  ]             Skin: rash, swelling[  ];, hair loss[  ];  peripheral edema[  ];  or itching[  ];psorisis on hands Musculosketetal: myalgias[  ];  joint swelling[  ];  joint erythema[  ];  joint pain[  ];  back pain[  ];  Heme/Lymph: bruising[  ];   bleeding[  ];  anemia[  ];  Neuro: TIA[  ];  headaches[  ];  stroke[  ];  vertigo[  ];  seizures[  ];   paresthesias[y  ];  difficulty walking[  ];  Psych:depression[  ]; anxiety[  ];  Endocrine: diabetes[  ];  thyroid dysfunction[  ];  Immunizations: Flu [ n ]; Pneumococcal[n  ];  Other:  Physical Exam: BP 132/86  Pulse 69  Temp(Src) 97.6 F (36.4 C) (Oral)  Resp 20  SpO2 98%  General appearance: alert, cooperative, appears stated age and no distress Neurologic: intact Heart: regular rate and rhythm, S1, S2 normal, no murmur, click, rub or gallop and normal apical impulse Lungs: clear to auscultation bilaterally and normal percussion bilaterally Abdomen: soft, non-tender; bowel sounds normal; no masses,  no organomegaly Extremities: extremities normal, atraumatic, no cyanosis or edema and Homans sign is negative, no sign of DVT no adenopathy cervical or axillary   Diagnostic Studies & Laboratory data:     Recent Radiology Findings:   Ct Soft Tissue Neck W Contrast  02/07/2013  *RADIOLOGY REPORT*  Clinical Data: 78 year old male with painless palpable abnormality under the right year.  Physician palpable abnormality left neck. Intermittent hoarseness.  BUN and creatinine were obtained on site at [facility] at [address] Results:  BUN 21 mg/dL,  Creatinine 0.9 mg/dL.  CT NECK WITH CONTRAST  Technique:  Multidetector CT imaging of the neck was performed with intravenous contrast.  Contrast: 75mL OMNIPAQUE IOHEXOL 300 MG/ML  SOLN  Comparison: Report of the neck CT 12/03/2002 (no images available).  Findings: In the visible lung apices there is a solid right upper lung lesion with irregular, spiculated margins encompassing 31 x 38 x 27 mm.  Some linear spiculation from lesion extends to the apical pleura which may be superimposed scarring.  No right paratracheal or visible right hilar lymphadenopathy.  No thoracic inlet lymphadenopathy identified.  In the neck a skin marker  was placed on the right side and is seen on series 2 image 29.  The this corresponds to the inferior aspect of the right parotid gland which appears normal.  No regional lymphadenopathy.  No soft tissue mass in this region.  No left side marker is identified to indicate the area of palpable abnormality.  Pharynx, parapharyngeal spaces, retropharyngeal space, sublingual space, submandibular glands, and  parotid glands are within normal limits.  There is mild asymmetry of the larynx which could indicate right vocal cord paralysis.  However, laryngeal asymmetry was also described on the comparison.  The thyroid is normal except for several sub-centimeter hypodense nodules in the left lobe, too small to characterize the most likely benign in the absence of risk factors for thyroid carcinoma.  Major vascular structures in the neck are patent.  Left greater than right carotid bifurcation atherosclerosis.  Negative visualized brain parenchyma.  No cervical lymphadenopathy.  Visualized paranasal sinuses and mastoids are clear.  Advanced degenerative changes in the cervical spine.  No suspicious osseous lesion identified in the neck.  Scoliosis in the thorax.  IMPRESSION:  1.  Spiculated right apical lung nodule/mass encompassing 38 mm greatest dimension.  This is most compatible with a right lung bronchogenic carcinoma. No right paratracheal or visible hilar/mediastinal lymphadenopathy identified. 2.  No neck mass or lymphadenopathy identified.  Study discussed by telephone with  Dr. Rodrigo Ran on 02/07/2013 at 1530 hours.   Original Report Authenticated By: Erskine Speed, M.D.    Nm Pet Image Initial (pi) Skull Base To Thigh  02/14/2013  *RADIOLOGY REPORT*  Clinical Data: Initial treatment strategy for right apical lung mass on prior neck CT.  NUCLEAR MEDICINE PET SKULL BASE TO THIGH  Fasting Blood Glucose:  98  Technique:  19.3 mCi F-18 FDG was injected intravenously. CT data was obtained and used for attenuation  correction and anatomic localization only.  (This was not acquired as a diagnostic CT examination.) Additional exam technical data entered on technologist worksheet.  Comparison:  Neck CT 02/08/1947.  Abdominal pelvic CT from [facility] dated 01/25/2013.  Findings:  Neck:  No abnormal hypermetabolism.  Chest:  Corresponding to the previously described abnormality, within the right apex, is a spiculated mass which measures 3.5 x 2.7 cm and a S.U.V. max of 4.9 on image 78/series 2.  Spiculation contacts the posterior right apical pleura and extends to the superior aspect of the right major fissure.  No abnormal nodal activity within the chest.  Abdomen/Pelvis:  No abnormal hypermetabolism.  Skeleton:  No evidence of hypermetabolic osseous lesions.  CT  images performed for attenuation correction demonstrate no cervical adenopathy.  Mild cardiomegaly, without pericardial effusion.  Tiny right and trace left pleural fluid  Normal adrenal glands.  Right nephrolithiasis.  Bilateral fluid density renal lesions which are likely cysts.  Old granulomatous disease in the spleen.  Normal urinary bladder.  Moderate prostatomegaly.  Tiny fat containing left inguinal hernia.  IMPRESSION:  1.  Hypermetabolic right apical lung mass, most consistent with primary bronchogenic carcinoma. No thoracic nodal or extrathoracic metastasis.  Presuming non-small cell histology, T2aN0M0 or stage IB. 2.  Trace right greater than left bilateral pleural effusions. 3.  Incidental findings, including right nephrolithiasis and prostatomegaly.   Original Report Authenticated By: Jeronimo Greaves, M.D.     Recent Lab Findings: Lab Results  Component Value Date   WBC 10.6* 02/20/2013   Lab Results  Component Value Date   WBC 10.6* 02/20/2013   HGB 16.0 02/20/2013   HCT 44.1 02/20/2013   PLT 193 02/20/2013   GLUCOSE 127* 02/20/2013   ALT 23 02/20/2013   AST 26 02/20/2013   NA 137 02/20/2013   K 4.3 02/20/2013   CL 105 02/20/2013   CREATININE  1.02 02/20/2013   BUN 20 02/20/2013   CO2 21 02/20/2013   INR 0.99 02/20/2013   MRI AND PFTS reviewed  Assessment / Plan:   Clinicial  T2aN0M0 or stage IB Lung Cancer by CT and PET scan without tissue Dx  I have recommended surgical resection of Right Upper Lobe for DX and treatment. Risks of surgery and and treatment options have been discussed with patient in  Detail. Patient is agreeable with proceeding. week.   The goals risks and alternatives of the planned surgical procedure BRONCH RT VATS LUNG RESECTION  have been discussed with the patient in detail. The risks of the procedure including death, infection, stroke, myocardial infarction, bleeding, blood transfusion have all been discussed specifically.  I have quoted Jenelle Mages a 3% of perioperative mortality and a complication rate as high as 15 %. The patient's questions have been answered.CRAIGORY TOSTE is willing  to proceed with the planned procedure.     Delight Ovens MD  Beeper [phone] Office [phone] 02/21/2013 8:20AM

## 2013-02-21 NOTE — Progress Notes (Signed)
Patient ID: KORRY DALGLEISH, male   DOB: Dec 26, 1934, 78 y.o.   MRN: [id number]  Urology procedure note  Preop diagnosis: BPH, difficult Foley  Postop diagnosis: Same  Procedure: Cystoscopy, Foley placement over wire with Seldinger technique  Surgeon: Deshanti Adcox  Type of anesthesia: Gen.  Findings: On cystoscopy the urethra appeared normal. The prostatic urethra had significant nodular regrowth of BPH with 100% visual obstruction. The bladder neck appeared elevated and required flexible scope to be advanced over a prostate nodule into the bladder. The bladder appeared normal without foreign body, stone or stitch. The mucosa appeared normal.  Description of procedure: Patient was asleep in the OR and several attempts at then made a Foley catheter placement. The patient had been previously prepped and draped for Foley placement therefore a flexible cystoscope was advanced per urethra. Once in the bladder a sensor wire was coiled in the bladder. The scope was removed and over the wire and 16 [place] [employer] tip catheter was advanced. The wire was removed and the Foley drained appropriately. The  balloon was inflated and seated at the bladder neck.and the catheter connected to gravity drainage.   Complications: None   Blood loss: Minimal   Specimens: None   Drains: 16 French Foley  Disposition: Patient turned back over to Dr. Tyrone Sage for his procedure.

## 2013-02-21 NOTE — Brief Op Note (Addendum)
02/21/2013  12:50 PM  PATIENT:  Alvin Hardy  78 y.o. 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 ,BPH, difficult foley  PROCEDURE:  Procedure(s): RIGHT VIDEO ASSISTED THORACOSCOPY/ MINI THORACOTOMY -Right Upper Lobectomy -Lymph Node Sampling Station 2 and 4  VIDEO BRONCHOSCOPY (N/A)  CYSTOSCOPY FLEXIBLE with insertion of foley catheter (N/A)  SURGEON:  Surgeon(s) and Role: Panel 1:    * Delight Ovens, MD - Primary  Panel 2:    * Antony Haste, MD - Primary  PHYSICIAN ASSISTANT: Lowella Dandy PA-C  ANESTHESIA:   general  EBL:  Total I/O In: -  Out: 225 [Urine:125; Blood:100]  BLOOD ADMINISTERED:none  DRAINS: 28 Straight chest tubes right chest x2   LOCAL MEDICATIONS USED:  MARCAINE     SPECIMEN:  Source of Specimen:  Right Upper Lobe of Right Lung, Lymph Nodes  DISPOSITION OF SPECIMEN:  PATHOLOGY  COUNTS:  YES   DICTATION: Reubin Milan Dictation and Other Dictation: Dictation Number .  PLAN OF CARE: Admit to inpatient   PATIENT DISPOSITION:  PACU - hemodynamically stable.   Delay start of Pharmacological VTE agent (>24hrs) due to surgical blood loss or risk of bleeding: yes

## 2013-02-21 NOTE — Anesthesia Procedure Notes (Addendum)
Procedure Name: Intubation Date/Time: 02/21/2013 8:43 AM Performed by: Arlice Colt B Pre-anesthesia Checklist: Patient identified, Emergency Drugs available, Suction available, Patient being monitored and Timeout performed Patient Re-evaluated:Patient Re-evaluated prior to inductionOxygen Delivery Method: Circle system utilized Preoxygenation: Pre-oxygenation with 100% oxygen Intubation Type: IV induction Ventilation: Mask ventilation without difficulty and Oral airway inserted - appropriate to patient size Laryngoscope Size: Hyacinth Meeker and 2 Grade View: Grade I Tube type: Oral Tube size: 8.5 mm Number of attempts: 1 Airway Equipment and Method: Stylet and Oral airway Placement Confirmation: ETT inserted through vocal cords under direct vision,  positive ETCO2,  CO2 detector and breath sounds checked- equal and bilateral Secured at: 23 cm Tube secured with: Tape Dental Injury: Teeth and Oropharynx as per pre-operative assessment

## 2013-02-21 NOTE — Transfer of Care (Signed)
Immediate Anesthesia Transfer of Care Note  Patient: Alvin Hardy  Procedure(s) Performed: Procedure(s): Right VIDEO ASSISTED THORACOSCOPY ,Thoracotomy with right upper lobectomy, node sampling  (Right) VIDEO BRONCHOSCOPY (N/A) CYSTOSCOPY FLEXIBLE with insertion of foley catheter (N/A)  Patient Location: PACU  Anesthesia Type:General  Level of Consciousness: awake, alert  and oriented  Airway & Oxygen Therapy: Patient Spontanous Breathing and Patient connected to nasal cannula oxygen  Post-op Assessment: Report given to PACU RN and Post -op Vital signs reviewed and stable  Post vital signs: Reviewed and stable  Complications: No apparent anesthesia complications

## 2013-02-22 ENCOUNTER — Encounter (HOSPITAL_COMMUNITY): Payer: Self-pay | Admitting: Cardiothoracic Surgery

## 2013-02-22 ENCOUNTER — Inpatient Hospital Stay (HOSPITAL_COMMUNITY): Payer: Medicare Other

## 2013-02-22 LAB — POCT I-STAT 3, ART BLOOD GAS (G3+)
Acid-Base Excess: 3 mmol/L — ABNORMAL HIGH (ref 0.0–2.0)
Bicarbonate: 28.4 mEq/L — ABNORMAL HIGH (ref 20.0–24.0)
O2 Saturation: 98 %
Patient temperature: 98.8
TCO2: 30 mmol/L (ref 0–100)
pCO2 arterial: 47.5 mmHg — ABNORMAL HIGH (ref 35.0–45.0)
pH, Arterial: 7.385 (ref 7.350–7.450)
pO2, Arterial: 107 mmHg — ABNORMAL HIGH (ref 80.0–100.0)

## 2013-02-22 LAB — CBC
Hemoglobin: 13.5 g/dL (ref 13.0–17.0)
MCH: 32.5 pg (ref 26.0–34.0)
MCHC: 36.1 g/dL — ABNORMAL HIGH (ref 30.0–36.0)
Platelets: 178 10*3/uL (ref 150–400)
RDW: 13.5 % (ref 11.5–15.5)

## 2013-02-22 LAB — BASIC METABOLIC PANEL
Calcium: 8.3 mg/dL — ABNORMAL LOW (ref 8.4–10.5)
GFR calc Af Amer: >90 mL/min (ref >90)
GFR calc non Af Amer: 82 mL/min — ABNORMAL LOW (ref >90)
Glucose, Bld: 125 mg/dL — ABNORMAL HIGH (ref 70–99)
Sodium: 135 mEq/L (ref 135–145)

## 2013-02-22 MED ORDER — CLONAZEPAM 0.5 MG PO TABS
0.5000 mg | ORAL_TABLET | Freq: Every day | ORAL | Status: DC
  Administered 2013-02-22 – 2013-02-25 (×4): 0.5 mg via ORAL
  Filled 2013-02-22 (×4): qty 1

## 2013-02-22 MED ORDER — ENOXAPARIN SODIUM 30 MG/0.3ML ~~LOC~~ SOLN
30.0000 mg | SUBCUTANEOUS | Status: DC
  Administered 2013-02-22 – 2013-02-26 (×5): 30 mg via SUBCUTANEOUS
  Filled 2013-02-22 (×5): qty 0.3

## 2013-02-22 MED ORDER — PHENOL 1.4 % MT LIQD
1.0000 | OROMUCOSAL | Status: DC | PRN
  Administered 2013-02-22: 1 via OROMUCOSAL
  Filled 2013-02-22: qty 177

## 2013-02-22 MED ORDER — HYDROMORPHONE HCL PF 1 MG/ML IJ SOLN
INTRAMUSCULAR | Status: DC | PRN
  Administered 2013-02-21: 1 mg via INTRAVENOUS

## 2013-02-22 MED ORDER — WHITE PETROLATUM GEL
Status: AC
  Administered 2013-02-22: 0.2
  Filled 2013-02-22: qty 5

## 2013-02-22 MED ORDER — LEVALBUTEROL HCL 0.63 MG/3ML IN NEBU: 0.6300 mg | INHALATION_SOLUTION | Freq: Four times a day (QID) | RESPIRATORY_TRACT | Status: DC | PRN

## 2013-02-22 MED ORDER — PROMETHAZINE HCL 25 MG/ML IJ SOLN: 6.2500 mg | Freq: Once | INTRAMUSCULAR | Status: DC

## 2013-02-22 NOTE — Progress Notes (Signed)
UR Completed.  Vangie Bicker [phone] 02/22/2013

## 2013-02-22 NOTE — Significant Event (Signed)
1610pm-pt transferred safely to 3316, settled in bed. VS stable prior and during the transfer. Pt spouse made aware of the transfer. Report given to receiving RN. Staff in room, call-bell given, bed in lowest position. Ova Gillentine, Charity fundraiser.

## 2013-02-22 NOTE — Care Management Note (Signed)
    Page 1 of 1   02/26/2013     10:36:33 AM   CARE MANAGEMENT NOTE 02/26/2013  Patient:  Alvin Hardy, Alvin Hardy   Account Number:  [id number]  Date Initiated:  02/22/2013  Documentation initiated by:  [facility]  Subjective/Objective Assessment:   Admitted for minithoracotomy, VATS for lung mass.  Has spouse     Action/Plan:   Anticipated DC Date:  02/26/2013   Anticipated DC Plan:  HOME W HOME HEALTH SERVICES         Choice offered to / List presented to:     DME arranged  Levan Hurst      DME agency  [facility]        Status of service:  Completed, signed off Medicare Important Message given?   (If response is "NO", the following Medicare IM given date fields will be blank) Date Medicare IM given:   Date Additional Medicare IM given:    Discharge Disposition:  HOME/SELF CARE  Per UR Regulation:  Reviewed for med. necessity/level of care/duration of stay  If discussed at Long Length of Stay Meetings, dates discussed:    Comments:  ContactHerb, [place] Spouse [phone]   [phone]  02/26/13- 1000- Donn Pierini RN, BSN [phone] Pt for d/c home today with wife- order for RW placed- [facility] to deliver to room prior to discharge.  02-22-13 4pm Avie Arenas, RNBSN [phone] Patient sitting up in chair.  Confirmed has wife who is able and  will be caring for him at discharge.  plan for tx to SDU today.  CM will follow for further needs.

## 2013-02-22 NOTE — Addendum Note (Signed)
Addendum created 02/22/13 1191 by Sheppard Evens, CRNA   Modules edited: Anesthesia Medication Administration

## 2013-02-22 NOTE — Progress Notes (Signed)
Pt admitted from 2300, family aware of tx, VSS, oriented to unit and routines, pain management discussed, pt verbalizes understanding

## 2013-02-22 NOTE — Progress Notes (Addendum)
                   [address]            [place] [address]          [phone]    1 Day Post-Op Procedure(s) (LRB): Right VIDEO ASSISTED THORACOSCOPY ,Thoracotomy with right upper lobectomy, node sampling  (Right) VIDEO BRONCHOSCOPY (N/A) CYSTOSCOPY FLEXIBLE with insertion of foley catheter (N/A)  Subjective: Patient is receiving breathing treatment now. Has incisional pain and feels faitgued  Objective: Vital signs in last 24 hours: Temp:  [98 F (36.7 C)-99 F (37.2 C)] 98.2 F (36.8 C) (04/24 0740) Pulse Rate:  [61-96] 61 (04/24 0700) Cardiac Rhythm:  [-] Normal sinus rhythm (04/24 0000) Resp:  [7-25] 14 (04/24 0700) BP: (95-140)/(52-88) 120/66 mmHg (04/24 0700) SpO2:  [95 %-100 %] 96 % (04/24 0700) Arterial Line BP: (87-190)/(51-87) 97/87 mmHg (04/24 0400) Weight:  [83.1 kg (183 lb 3.2 oz)] 83.1 kg (183 lb 3.2 oz) (04/24 0600)     Intake/Output from previous day: 04/23 0701 - 04/24 0700 In: 2432.3 [P.O.:840; I.V.:1292.3; IV Piggyback:300] Out: 2063 [Urine:1585; Blood:100; Chest Tube:378]   Physical Exam:  Cardiovascular: RRR Pulmonary: Slightly diminished at bases; no rales, wheezes, or rhonchi. Abdomen: Soft, non tender, bowel sounds present. Extremities: Mild bilateral lower extremity edema. Wounds: Clean and dry.  No erythema or signs of infection. Chest Tubes: To suction, no air leak  Lab Results: CBC: Recent Labs  02/20/13 1538 02/22/13 0415  WBC 10.6* 12.4*  HGB 16.0 13.5  HCT 44.1 37.4*  PLT 193 178   BMET:  Recent Labs  02/20/13 1538 02/22/13 0415  NA 137 135  K 4.3 4.1  CL 105 101  CO2 21 27  GLUCOSE 127* 125*  BUN 20 16  CREATININE 1.02 0.89  CALCIUM 9.3 8.3*    PT/INR:  Recent Labs  02/20/13 1538  LABPROT 13.0  INR 0.99   ABG:  INR: Will add last result for INR, ABG once components are confirmed Will add last 4 CBG results once components are confirmed  Assessment/Plan:  1. CV - SR 2.  Pulmonary -  Chest tubes with a little less than 400 cc of output since surgery. CXR this am appears to show low lung volumes, bibasilar atelectasis, no pneumothorax. Chest tubes to remain to suction for now. 3.Patient requesting Clonazepam at bedtime. Has taken this for years and is unable to sleep without it. Will order 4.Decrease IVF 5.Possible transfer to 3300  ZIMMERMAN,DONIELLE MPA-C 02/22/2013,7:57 AM  Good pain control No hungry Ct to water seal To 3300 I have seen and examined Alvin Hardy and agree with the above assessment  and plan.  Delight Ovens MD Beeper [phone] Office [phone] 02/22/2013 8:38 AM

## 2013-02-22 NOTE — Progress Notes (Signed)
Left radial a-line dampened, positional.  Able to draw adequate sample for ABG from catheter hub.  A-line removed, catheter intact.  Pressure applied to sight and dressing applied.

## 2013-02-23 ENCOUNTER — Inpatient Hospital Stay (HOSPITAL_COMMUNITY): Payer: Medicare Other

## 2013-02-23 LAB — CBC
Hemoglobin: 13.2 g/dL (ref 13.0–17.0)
MCHC: 35.7 g/dL (ref 30.0–36.0)
RDW: 13.4 % (ref 11.5–15.5)
WBC: 13.6 10*3/uL — ABNORMAL HIGH (ref 4.0–10.5)

## 2013-02-23 LAB — COMPREHENSIVE METABOLIC PANEL
ALT: 12 U/L (ref 0–53)
Albumin: 3.1 g/dL — ABNORMAL LOW (ref 3.5–5.2)
Alkaline Phosphatase: 43 U/L (ref 39–117)
Glucose, Bld: 120 mg/dL — ABNORMAL HIGH (ref 70–99)
Potassium: 3.9 mEq/L (ref 3.5–5.1)
Sodium: 138 mEq/L (ref 135–145)
Total Protein: 5.9 g/dL — ABNORMAL LOW (ref 6.0–8.3)

## 2013-02-23 NOTE — Progress Notes (Addendum)
TCTS DAILY PROGRESS NOTE                   [address]            [place] [address]          [phone]      2 Days Post-Op Procedure(s) (LRB): Right VIDEO ASSISTED THORACOSCOPY ,Thoracotomy with right upper lobectomy, node sampling  (Right) VIDEO BRONCHOSCOPY (N/A) CYSTOSCOPY FLEXIBLE with insertion of foley catheter (N/A)  Total Length of Stay:  LOS: 2 days   Subjective: Feels better today, appetite improving , less pain, less SOB  Objective: Vital signs in last 24 hours: Temp:  [98.2 F (36.8 C)-99.4 F (37.4 C)] 99.4 F (37.4 C) (04/25 0722) Pulse Rate:  [72-98] 94 (04/25 0423) Cardiac Rhythm:  [-] Normal sinus rhythm (04/25 0423) Resp:  [13-29] 21 (04/25 0520) BP: (109-145)/(61-99) 130/66 mmHg (04/25 0423) SpO2:  [90 %-98 %] 96 % (04/25 0423) Weight:  [183 lb (83.008 kg)] 183 lb (83.008 kg) (04/24 1556)  Filed Weights   02/22/13 0600 02/22/13 1556  Weight: 183 lb 3.2 oz (83.1 kg) 183 lb (83.008 kg)    Weight change: -3.2 oz (-0.092 kg)   Hemodynamic parameters for last 24 hours:    Intake/Output from previous day: 04/24 0701 - 04/25 0700 In: 1450.3 [P.O.:240; I.V.:1110.3; IV Piggyback:100] Out: 9147 [Urine:3475; Chest Tube:150]  Intake/Output this shift: Total I/O In: -  Out: 275 [Urine:275]  Current Meds: Scheduled Meds: . aspirin EC  81 mg Oral Daily  . bisacodyl  10 mg Oral Daily  . cholecalciferol  1,000 Units Oral Daily  . clonazePAM  0.5 mg Oral QHS  . colesevelam  3,750 mg Oral Q supper  . enoxaparin  30 mg Subcutaneous Q24H  . fentaNYL   Intravenous Q4H  . finasteride  5 mg Oral QAC breakfast  . folic acid  1 mg Oral Daily  . gabapentin  900 mg Oral QHS  . gabapentin  600 mg Oral q morning - 10a  . lithium carbonate  300 mg Oral QHS  . [START ON 02/26/2013] methotrexate  15 mg Oral Q Mon  . pantoprazole  80 mg Oral QAC breakfast  . promethazine  6.25 mg Intravenous Once  . testosterone cypionate  100 mg Intramuscular  Q Fri  . cyanocobalamin  1,000 mcg Oral Daily   Continuous Infusions: . bupivacaine 0.25 % ON-Q pump SINGLE CATH 300 mL    . dextrose 5 % and 0.45% NaCl 50 mL/hr at 02/22/13 0840   PRN Meds:.diphenhydrAMINE, diphenhydrAMINE, levalbuterol, naloxone, ondansetron (ZOFRAN) IV, ondansetron (ZOFRAN) IV, oxyCODONE-acetaminophen, phenol, potassium chloride, sodium chloride  General appearance: alert, cooperative and no distress Heart: regular rate and rhythm and tachy Lungs: clear to auscultation bilaterally Abdomen: mod distension, soft, nontender, + BS Extremities: no edema Wound: dressings CDI  Lab Results: CBC: Recent Labs  02/22/13 0415 02/23/13 0419  WBC 12.4* 13.6*  HGB 13.5 13.2  HCT 37.4* 37.0*  PLT 178 171   BMET:  Recent Labs  02/22/13 0415 02/23/13 0419  NA 135 138  K 4.1 3.9  CL 101 104  CO2 27 28  GLUCOSE 125* 120*  BUN 16 9  CREATININE 0.89 0.89  CALCIUM 8.3* 8.8    PT/INR:  Recent Labs  02/20/13 1538  LABPROT 13.0  INR 0.99   Radiology: Dg Chest Port 1 View  02/22/2013  *RADIOLOGY REPORT*  Clinical Data: Postoperative thoracotomy with chest tubes in place  Left lung is  clear.  PORTABLE CHEST - 1 VIEW  Comparison:  February 21, 2013  Findings: Two chest tubes remain in place in the right.  There is a tiny apical pneumothorax.  There is a central catheter on the right with the tip either in or overlying the superior vena cava.  There remains a soft tissue prominence in the right hilum with what is felt to be partial collapse of the right upper lobe.  Adenopathy in the right hilar region cannot be excluded.  Lungs are otherwise clear.  Heart is upper normal in size with normal pulmonary vascularity. There is stable rib fractures on the right.  IMPRESSION: Minimal pneumothorax on the right with right-sided chest tubes unchanged in position.  Soft tissue fullness in the right perihilar region remains, felt in large part to be due to focal lung collapse in this area.  Left lung is clear.   Original Report Authenticated By: Bretta Bang, M.D.    Dg Chest Portable 1 View  02/21/2013  *RADIOLOGY REPORT*  Clinical Data: Status post VATS  PORTABLE CHEST - 1 VIEW  Comparison: Two-view chest 02/20/2013.  Findings: A right subclavian catheter terminates in the SVC.  To right-sided chest tubes are in place.  There is no pneumothorax. Patient is status post right upper lobectomy.  A right rib osteotomies noted.  Mild bibasilar atelectasis is worse on the left.  IMPRESSION:  1.  Postoperative changes of VATS and right upper lobectomy. 2.  Low lung volumes and bibasilar atelectasis. 3.  Two right-sided chest tubes are in place without evidence for significant pneumothorax.   Original Report Authenticated By: Marin Roberts, M.D.    Chest tube- Pos air leak with cough, 150 cc out yesterday  CXR- about same as yesterday in appearance, technique not as good, inspiration not quite as good    Assessment/Plan: S/P Procedure(s) (LRB): Right VIDEO ASSISTED THORACOSCOPY ,Thoracotomy with right upper lobectomy, node sampling  (Right) VIDEO BRONCHOSCOPY (N/A) CYSTOSCOPY FLEXIBLE with insertion of foley catheter (N/A)  1. 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  Delight Ovens MD Beeper [phone] Office [phone] 02/23/2013 3:10 PM

## 2013-02-23 NOTE — Discharge Summary (Signed)
Physician Discharge Summary  Patient ID: Alvin Hardy MRN: [id number] DOB/AGE: 76-Jan-1936 76 y.o.  Admit date: 02/21/2013 Discharge date: 02/26/2013  Admission Diagnoses: 1.Right upper lung mass 2.History of remote tobacco abuse 3.History of BPH 4.History of hyperlipidemia 5.History of GERD 6.History of Gilberts syndrome  Discharge Diagnoses:  1.Right upper lung mass 2.History of remote tobacco abuse 3.History of BPH 4.History of hyperlipidemia 5.History of GERD 6.History of Gilberts syndrome 7.Enterococcus UTI  Consults: urology  Procedure (s):  1.Cystoscopy, Foley placement over wire with Seldinger technique by Dr. Mena Goes on 02/21/2013  2.RIGHT VIDEO ASSISTED THORACOSCOPY/ MINI THORACOTOMY, RIGHT UPPER LOBECTOMY, and LYMPH NODE SAMPELING by Dr. Tyrone Sage on 02/21/2013.  Pathology:  1. Lung, resection (segmental or lobe), Right upper lobe - ADENOCARCINOMA, 3.4 CM. - MARGINS NOT INVOLVED. - VISCERAL PLEURA FREE OF TUMOR. - ONE BENIGN LYMPH NODE. 2. Lymph node, biopsy, Right upper 4 R - METASTATIC ADENOCARCINOMA. 3. Lymph node, biopsy, Right 2 R - ANTHRACOTIC LYMPH NODE. NO TUMOR IDENTIFIED. TNM Code: pT2a,pN2,pMX  History of Presenting Illness: This is a 78 year old male who noticed voice breaks. He was treated with nexium for possible reflux disease. Because of persistent trouble, a chest xray was done.Results showed a  lung nodule. The patient has ahistory of smoking, but quit in 1980. The patient denies hemoptysis, wheezing,history of MI or previous cardiac history. A CT was done which showed a spiculated right apical lung mass/nodule encompassing 38 mm in greatest dimension. It was felt that this was most compatible with a right lung bronchogenic carcinoma. There was no right paratracheal, hilar, or mediastinal lymphadenopathy seen. A PET scan then showed a hypermetabolic right apical lung mass (SUV mas 4.9), trace bilateral pleural effusions (right greater than left),  and incidentally nephrolithiasis and prostatomegaly. The patient was seen in the office by Dr. Tyrone Sage for the consideration of surgical resection of the right upper lobe mass. Potential risks, complications, and benefits were discussed and the patient agreed to proceed. He was admitted on 02/21/2013 in order to undergo a right VATS, mini thoracotomy, RUL, and lymph node sampling.   Brief Hospital Course:  It should be noted that prior to surgery, there was difficulty in placing a foley. Patient does have a history of BPH.A urology consult was obtained with Dr. Mena Goes. He performed a cystoscopy and placed the foley catheter.He has remained a febrile and hemodynamically stable. His a line and foley were removed early in his post op course. Daily chest x rays were obtained and remained stable. Chest tube output gradually decreased. He was felt surgically stable for transfer from the ICU to 3300 for further convalescence on post op day one.All chest tubes were removed by post op day 5. The foley catheter remained for a few days post operatively. It was then removed and he was able to void on his own. He has been tolerating a diet and has had a bowel movement. He did have a fever to 101.5. He had no signs of wound infection. UA showed rare bacteria but 3-6 WBC. His preliminary urine culture showed Enterococcus. He was felt surgically stable for discharge on 02/26/2013.Because the urine culture results becamed known after he had been discharged, the antibiotic was called in to his pharmacy.   Latest Vital Signs: Blood pressure 146/76, pulse 95, temperature 99.1 F (37.3 C), temperature source Oral, resp. rate 31, height 5\' 10"  (1.778 m), weight 83.008 kg (183 lb), SpO2 94.00%.  Physical Exam: General appearance: alert, cooperative and no distress  Heart: regular  rate and rhythm and tachy  Lungs: clear to auscultation bilaterally  Abdomen: mod distension, soft, nontender, + BS  Extremities: no edema   Wound: clean and dry   Discharge Condition:Stable  Recent laboratory studies:  Lab Results  Component Value Date   WBC 13.6* 02/23/2013   HGB 13.2 02/23/2013   HCT 37.0* 02/23/2013   MCV 89.6 02/23/2013   PLT 171 02/23/2013   Lab Results  Component Value Date   NA 138 02/23/2013   K 3.9 02/23/2013   CL 104 02/23/2013   CO2 28 02/23/2013   CREATININE 0.89 02/23/2013   GLUCOSE 120* 02/23/2013      Diagnostic Studies:   Ct Soft Tissue Neck W Contrast  02/07/2013  *RADIOLOGY REPORT*  Clinical Data: 78 year old male with painless palpable abnormality under the right year.  Physician palpable abnormality left neck. Intermittent hoarseness.  BUN and creatinine were obtained on site at [facility] at [address] Results:  BUN 21 mg/dL,  Creatinine 0.9 mg/dL.  CT NECK WITH CONTRAST  Technique:  Multidetector CT imaging of the neck was performed with intravenous contrast.  Contrast: 75mL OMNIPAQUE IOHEXOL 300 MG/ML  SOLN  Comparison: Report of the neck CT 12/03/2002 (no images available).  Findings: In the visible lung apices there is a solid right upper lung lesion with irregular, spiculated margins encompassing 31 x 38 x 27 mm.  Some linear spiculation from lesion extends to the apical pleura which may be superimposed scarring.  No right paratracheal or visible right hilar lymphadenopathy.  No thoracic inlet lymphadenopathy identified.  In the neck a skin marker was placed on the right side and is seen on series 2 image 29.  The this corresponds to the inferior aspect of the right parotid gland which appears normal.  No regional lymphadenopathy.  No soft tissue mass in this region.  No left side marker is identified to indicate the area of palpable abnormality.  Pharynx, parapharyngeal spaces, retropharyngeal space, sublingual space, submandibular glands, and parotid glands are within normal limits.  There is mild asymmetry of the larynx which could indicate right vocal cord paralysis.   However, laryngeal asymmetry was also described on the comparison.  The thyroid is normal except for several sub-centimeter hypodense nodules in the left lobe, too small to characterize the most likely benign in the absence of risk factors for thyroid carcinoma.  Major vascular structures in the neck are patent.  Left greater than right carotid bifurcation atherosclerosis.  Negative visualized brain parenchyma.  No cervical lymphadenopathy.  Visualized paranasal sinuses and mastoids are clear.  Advanced degenerative changes in the cervical spine.  No suspicious osseous lesion identified in the neck.  Scoliosis in the thorax.  IMPRESSION:  1.  Spiculated right apical lung nodule/mass encompassing 38 mm greatest dimension.  This is most compatible with a right lung bronchogenic carcinoma. No right paratracheal or visible hilar/mediastinal lymphadenopathy identified. 2.  No neck mass or lymphadenopathy identified.  Study discussed by telephone with  Dr. Rodrigo Ran on 02/07/2013 at 1530 hours.   Original Report Authenticated By: Erskine Speed, M.D.    Mr Laqueta Jean Wo Contrast  02/20/2013  *RADIOLOGY REPORT*  Clinical Data: 78 year old male with lung mass.  Staging.  Headache and dizziness.  MRI HEAD WITHOUT AND WITH CONTRAST  Technique:  Multiplanar, multiecho pulse sequences of the brain and surrounding structures were obtained according to standard protocol without and with intravenous contrast  Contrast: 17mL MULTIHANCE GADOBENATE DIMEGLUMINE 529 MG/ML IV SOLN  Comparison:  Neck CT 02/07/2013.  Findings: Small developmental venous anomaly of left anterior frontal lobe. No abnormal enhancement identified.  No midline shift, mass effect, or evidence of mass lesion.  No restricted diffusion to suggest acute infarction.  No ventriculomegaly. No acute intracranial hemorrhage identified. Major intracranial vascular flow voids the are preserved.  Dominant distal left vertebral artery.   Negative pituitary and  cervicomedullary junction.  Negative visualized cervical spine except for degenerative changes. Wallace Cullens and white matter signal is within normal limits throughout the brain.  Normal bone marrow signal.  Postoperative changes to both globes. Visualized paranasal sinuses and mastoids are clear.  Negative scalp soft tissues.  IMPRESSION: Negative. No acute or metastatic intracranial abnormality.   Original Report Authenticated By: Erskine Speed, M.D.    Nm Pet Image Initial (pi) Skull Base To Thigh  02/14/2013  *RADIOLOGY REPORT*  Clinical Data: Initial treatment strategy for right apical lung mass on prior neck CT.  NUCLEAR MEDICINE PET SKULL BASE TO THIGH  Fasting Blood Glucose:  98  Technique:  19.3 mCi F-18 FDG was injected intravenously. CT data was obtained and used for attenuation correction and anatomic localization only.  (This was not acquired as a diagnostic CT examination.) Additional exam technical data entered on technologist worksheet.  Comparison:  Neck CT 02/08/1947.  Abdominal pelvic CT from [facility] dated 01/25/2013.  Findings:  Neck:  No abnormal hypermetabolism.  Chest:  Corresponding to the previously described abnormality, within the right apex, is a spiculated mass which measures 3.5 x 2.7 cm and a S.U.V. max of 4.9 on image 78/series 2.  Spiculation contacts the posterior right apical pleura and extends to the superior aspect of the right major fissure.  No abnormal nodal activity within the chest.  Abdomen/Pelvis:  No abnormal hypermetabolism.  Skeleton:  No evidence of hypermetabolic osseous lesions.  CT  images performed for attenuation correction demonstrate no cervical adenopathy.  Mild cardiomegaly, without pericardial effusion.  Tiny right and trace left pleural fluid  Normal adrenal glands.  Right nephrolithiasis.  Bilateral fluid density renal lesions which are likely cysts.  Old granulomatous disease in the spleen.  Normal urinary bladder.  Moderate prostatomegaly.  Tiny fat  containing left inguinal hernia.  IMPRESSION:  1.  Hypermetabolic right apical lung mass, most consistent with primary bronchogenic carcinoma. No thoracic nodal or extrathoracic metastasis.  Presuming non-small cell histology, T2aN0M0 or stage IB. 2.  Trace right greater than left bilateral pleural effusions. 3.  Incidental findings, including right nephrolithiasis and prostatomegaly.   Original Report Authenticated By: Jeronimo Greaves, M.D.    Dg Chest Port 1 View  02/23/2013  *RADIOLOGY REPORT*  Clinical Data: Postop, chest tube  PORTABLE CHEST - 1 VIEW  Comparison: 02/22/2013  Findings: Postsurgical changes in the right hemithorax with associated volume loss and right perihilar fullness.  Two right apical chest tubes.  No pneumothorax is seen.  Mild subcutaneous emphysema along the right chest wall.  Low lung volumes.  Mild cardiomegaly.  Stable right subclavian venous catheter.  IMPRESSION: Postsurgical changes in the right hemithorax.  Two right apical chest tubes.  No pneumothorax is seen.   Original Report Authenticated By: Charline Bills, M.D.      Future Appointments Provider Department Dept Phone   03/15/2013 2:30 PM Delight Ovens, MD [facility] [phone]      Discharge Medications:   Medication List    TAKE these medications       aspirin EC 81 MG tablet  Take 81 mg by mouth daily.     calcipotriene 0.005 % cream  Commonly known as:  DOVONOX  Apply 1 application topically daily as needed (for hands).     CALCIUM 600 PO  Take 1 tablet by mouth daily.     clobetasol cream 0.05 %  Commonly known as:  TEMOVATE  Apply 1 application topically daily.     clonazePAM 0.5 MG tablet  Commonly known as:  KLONOPIN  Take 0.5 mg by mouth at bedtime.     colesevelam 625 MG tablet  Commonly known as:  WELCHOL  Take 3,750 mg by mouth daily with supper.     cyanocobalamin 1000 MCG tablet  Take 1,000 mcg by mouth daily.     esomeprazole  40 MG capsule  Commonly known as:  NEXIUM  Take 40 mg by mouth daily before breakfast.     finasteride 5 MG tablet  Commonly known as:  PROSCAR  Take 5 mg by mouth daily before breakfast.     folic acid 1 MG tablet  Commonly known as:  FOLVITE  Take 1 mg by mouth daily.     gabapentin 300 MG capsule  Commonly known as:  NEURONTIN  Take 600-900 mg by mouth 2 (two) times daily. Takes 2 capsules every morning, 300mg  x3 at night     glucosamine-chondroitin 500-400 MG tablet  Take 1 tablet by mouth 2 (two) times daily.     lithium carbonate 300 MG CR tablet  Commonly known as:  LITHOBID  Take 300 mg by mouth at bedtime.     methotrexate 2.5 MG tablet  Commonly known as:  RHEUMATREX  Take 15 mg by mouth every Monday. Caution:Chemotherapy. Protect from light. Take 6 tabs once a week (Monday)  Last dose of Methotrexate taken at 02/19/2013     MULTIVITAMIN PO  Take 2 tablets by mouth daily.     oxyCODONE-acetaminophen 5-325 MG per tablet  Commonly known as:  PERCOCET/ROXICET  Take 1-2 tablets by mouth every 4 (four) hours as needed.     propranolol 10 MG tablet  Commonly known as:  INDERAL  Take 10-20 mg by mouth daily as needed (Takes rarely, only under extremely stressful circumstances.).     testosterone cypionate 100 MG/ML injection  Commonly known as:  DEPOTESTOTERONE CYPIONATE  Inject 100 mg into the muscle every Friday. For IM use only     vardenafil 5 MG tablet  Commonly known as:  LEVITRA  Take 5 mg by mouth daily as needed for erectile dysfunction.     Vitamin D-3 1000 UNITS Caps  Take 1,000 Units by mouth daily.        Follow Up Appointments: Follow-up Information   Follow up with GERHARDT,EDWARD B, MD. (PA/LAT CXR to be taken 1 hour prior to office appointment(at [facility] which is in the same building as Dr. Dennie Maizes office) ;Office will mail appointment date and time)    Contact information:   [address] [place]  [address] [phone]       Follow up with Chelsea Aus, MD. (Call for follow up appointment)    Contact information:   [address] [place] [address] [phone]       Signed: Doree Fudge MPA-C 02/28/2013, 9:23 AM

## 2013-02-24 ENCOUNTER — Inpatient Hospital Stay (HOSPITAL_COMMUNITY): Payer: Medicare Other

## 2013-02-24 MED ORDER — TESTOSTERONE CYPIONATE 200 MG/ML IM SOLN
100.0000 mg | INTRAMUSCULAR | Status: DC
  Administered 2013-02-24: 100 mg via INTRAMUSCULAR
  Filled 2013-02-24 (×2): qty 0.5

## 2013-02-24 MED ORDER — LACTULOSE 10 GM/15ML PO SOLN
20.0000 g | Freq: Once | ORAL | Status: AC
  Administered 2013-02-24: 20 g via ORAL
  Filled 2013-02-24: qty 30

## 2013-02-24 MED ORDER — TESTOSTERONE CYPIONATE 200 MG/ML IM SOLN: 100.0000 mg | INTRAMUSCULAR | Status: DC

## 2013-02-24 NOTE — Progress Notes (Signed)
Coude cath was removed per MD order. Pt tolerated procedure well. Will continue to monitor urine output.

## 2013-02-24 NOTE — Progress Notes (Addendum)
                   [address]            [place] [address]          [phone]    3 Days Post-Op Procedure(s) (LRB): Right VIDEO ASSISTED THORACOSCOPY ,Thoracotomy with right upper lobectomy, node sampling  (Right) VIDEO BRONCHOSCOPY (N/A) CYSTOSCOPY FLEXIBLE with insertion of foley catheter (N/A)  Subjective: Patient did not sleep well and has some incisional pain. Coughed up some blood this morning. Passing flatus but no bowel movement yet  Objective: Vital signs in last 24 hours: Temp:  [98.6 F (37 C)-99.9 F (37.7 C)] 98.6 F (37 C) (04/26 0700) Pulse Rate:  [87-106] 91 (04/26 0350) Cardiac Rhythm:  [-] Normal sinus rhythm (04/26 0350) Resp:  [14-23] 14 (04/26 0400) BP: (131-151)/(67-85) 131/71 mmHg (04/26 0350) SpO2:  [96 %-98 %] 97 % (04/26 0400)    Intake/Output from previous day: 04/25 0701 - 04/26 0700 In: 550 [I.V.:550] Out: 2125 [Urine:1975; Chest Tube:150]   Physical Exam:  Cardiovascular: RRR Pulmonary: Diminished right and clear on the left; no rales, wheezes, or rhonchi. Abdomen: Soft, non tender, bowel sounds present. Extremities: No lower extremity edema. Wounds: Clean and dry.   Chest Tube: +1 intermittent air leak with cough  Lab Results: CBC: Recent Labs  02/22/13 0415 02/23/13 0419  WBC 12.4* 13.6*  HGB 13.5 13.2  HCT 37.4* 37.0*  PLT 178 171   BMET:  Recent Labs  02/22/13 0415 02/23/13 0419  NA 135 138  K 4.1 3.9  CL 101 104  CO2 27 28  GLUCOSE 125* 120*  BUN 16 9  CREATININE 0.89 0.89  CALCIUM 8.3* 8.8    PT/INR: No results found for this basename: LABPROT, INR,  in the last 72 hours ABG:  INR: Will add last result for INR, ABG once components are confirmed Will add last 4 CBG results once components are confirmed  Assessment/Plan:  1. CV - SR. 2.  Pulmonary - Chest tube with 150 cc last 24 hours. There is a small, intermitten air leak with cough. Leave chest tube for now. CXR this am shows no  pneumothorax, low lung volumes, left atelectasis, and stable prominence of the right hilum (?hematoma vs atelectasis). Pathology discussed with patient yesterday. Will need MTOC in follow up 3.Urinary retention-On Proscar as taken pre op. foley removed earlier this am. 4. LOC constipation  ZIMMERMAN,DONIELLE MPA-C 02/24/2013,9:34 AM  Agree with above. Will reassess chest tube in am.

## 2013-02-25 ENCOUNTER — Inpatient Hospital Stay (HOSPITAL_COMMUNITY): Payer: Medicare Other

## 2013-02-25 NOTE — Progress Notes (Addendum)
                   [address]            [place] [address]          [phone]    4 Days Post-Op Procedure(s) (LRB): Right VIDEO ASSISTED THORACOSCOPY ,Thoracotomy with right upper lobectomy, node sampling  (Right) VIDEO BRONCHOSCOPY (N/A) CYSTOSCOPY FLEXIBLE with insertion of foley catheter (N/A)  Subjective: Patient has already walked a fair amount this morning.  Objective: Vital signs in last 24 hours: Temp:  [97.9 F (36.6 C)-99.7 F (37.6 C)] 99 F (37.2 C) (04/27 0746) Pulse Rate:  [85-106] 91 (04/27 0746) Cardiac Rhythm:  [-] Normal sinus rhythm (04/27 0746) Resp:  [14-21] 14 (04/27 0746) BP: (96-145)/(64-81) 96/64 mmHg (04/27 0746) SpO2:  [90 %-98 %] 92 % (04/27 0746)    Intake/Output from previous day: 04/26 0701 - 04/27 0700 In: 1560 [P.O.:960; I.V.:600] Out: 2150 [Urine:1900; Chest Tube:250]   Physical Exam:  Cardiovascular: RRR Pulmonary: Diminished right and clear on the left; no rales, wheezes, or rhonchi. Abdomen: Soft, non tender, bowel sounds present. Extremities: No lower extremity edema. Wounds: Clean and dry.   Chest Tube: chest tube is to water seal,no  air leak with cough  Lab Results: CBC:  Recent Labs  02/23/13 0419  WBC 13.6*  HGB 13.2  HCT 37.0*  PLT 171   BMET:   Recent Labs  02/23/13 0419  NA 138  K 3.9  CL 104  CO2 28  GLUCOSE 120*  BUN 9  CREATININE 0.89  CALCIUM 8.8    PT/INR: No results found for this basename: LABPROT, INR,  in the last 72 hours ABG:  INR: Will add last result for INR, ABG once components are confirmed Will add last 4 CBG results once components are confirmed  Assessment/Plan:  1. CV - SR. 2.  Pulmonary - Chest tube with 250 cc last 24 hours. There is no air leak. Leave chest tube for now. CXR appears stable. Will need MTOC in follow up 3.Urinary retention-On Proscar as taken pre op. Foley removed yesterday. Able to void on own.   ZIMMERMAN,DONIELLE  MPA-C 02/25/2013,9:41 AM  CXR looks good. There is no air leak. No output from tube since last night 7 pm and there is only a small amount in tubing. I think the tube can come out. Possibly home tomorrow.

## 2013-02-26 ENCOUNTER — Inpatient Hospital Stay (HOSPITAL_COMMUNITY): Payer: Medicare Other

## 2013-02-26 LAB — URINALYSIS, ROUTINE W REFLEX MICROSCOPIC
Glucose, UA: NEGATIVE mg/dL
Protein, ur: NEGATIVE mg/dL
Specific Gravity, Urine: 1.007 (ref 1.005–1.030)
Urobilinogen, UA: 0.2 mg/dL (ref 0.0–1.0)

## 2013-02-26 LAB — URINE MICROSCOPIC-ADD ON

## 2013-02-26 MED ORDER — OXYCODONE-ACETAMINOPHEN 5-325 MG PO TABS: 1.0000 | ORAL_TABLET | ORAL | Status: DC | PRN

## 2013-02-26 NOTE — Progress Notes (Signed)
[facility] DAILY PROGRESS NOTE                   [address]            [place] [id number]          [phone]      5 Days Post-Op Procedure(s) (LRB): Right VIDEO ASSISTED THORACOSCOPY ,Thoracotomy with right upper lobectomy, node sampling  (Right) VIDEO BRONCHOSCOPY (N/A) CYSTOSCOPY FLEXIBLE with insertion of foley catheter (N/A)  Total Length of Stay:  LOS: 5 days   Subjective: Feels well, voiding ok, no new issues. No fever since about noon yest  Objective: Vital signs in last 24 hours: Temp:  [98.2 F (36.8 C)-101.5 F (38.6 C)] 99.1 F (37.3 C) (04/28 0715) Pulse Rate:  [88-100] 95 (04/28 0715) Cardiac Rhythm:  [-] Normal sinus rhythm (04/28 0528) Resp:  [19-31] 31 (04/28 0715) BP: (127-149)/(61-78) 146/76 mmHg (04/28 0715) SpO2:  [91 %-94 %] 94 % (04/28 0715)  Filed Weights   02/22/13 0600 02/22/13 1556  Weight: 183 lb 3.2 oz (83.1 kg) 183 lb (83.008 kg)    Weight change:    Hemodynamic parameters for last 24 hours:    Intake/Output from previous day: 04/27 0701 - 04/28 0700 In: 1220 [P.O.:720; I.V.:500] Out: 2350 [Urine:2350]  Intake/Output this shift: Total I/O In: 50 [I.V.:50] Out: -   Current Meds: Scheduled Meds: . aspirin EC  81 mg Oral Daily  . bisacodyl  10 mg Oral Daily  . cholecalciferol  1,000 Units Oral Daily  . clonazePAM  0.5 mg Oral QHS  . colesevelam  3,750 mg Oral Q supper  . enoxaparin  30 mg Subcutaneous Q24H  . fentaNYL   Intravenous Q4H  . finasteride  5 mg Oral QAC breakfast  . folic acid  1 mg Oral Daily  . gabapentin  900 mg Oral QHS  . gabapentin  600 mg Oral q morning - 10a  . lithium carbonate  300 mg Oral QHS  . methotrexate  15 mg Oral Q Mon  . pantoprazole  80 mg Oral QAC breakfast  . promethazine  6.25 mg Intravenous Once  . testosterone cypionate  100 mg Intramuscular Q Fri  . cyanocobalamin  1,000 mcg Oral Daily   Continuous Infusions: . dextrose 5 % and 0.45% NaCl 50 mL/hr at 02/26/13 0600    PRN Meds:.diphenhydrAMINE, diphenhydrAMINE, levalbuterol, naloxone, ondansetron (ZOFRAN) IV, ondansetron (ZOFRAN) IV, oxyCODONE-acetaminophen, phenol, potassium chloride, sodium chloride  General appearance: alert, cooperative and no distress Heart: regular rate and rhythm Lungs: clear to auscultation bilaterally Abdomen: benign Extremities: warm well perfused Wound: incis healing well  Lab Results: CBC:No results found for this basename: WBC, HGB, HCT, PLT,  in the last 72 hours BMET: No results found for this basename: NA, K, CL, CO2, GLUCOSE, BUN, CREATININE, CALCIUM,  in the last 72 hours  PT/INR: No results found for this basename: LABPROT, INR,  in the last 72 hours Radiology: Dg Chest 2 View  02/25/2013  *RADIOLOGY REPORT*  Clinical Data: Evaluate right-sided chest tube  CHEST - 2 VIEW  Comparison: 02/24/2013; 02/20/2013; PET CT - 02/14/2013  Findings:  Grossly unchanged cardiac silhouette and mediastinal contours with obscuration of the right heart border secondary to partial right upper lobe atelectasis.  Surgical clips are seen about the right hilum.  Stable positioning of support apparatus with likely unchanged tiny right apical pneumothorax.  Postsurgical changes of the lateral aspect of the right fifth rib.  Interval decrease in minimal amount  of residual right lateral chest wall subcutaneous emphysema.  No new focal airspace opacities.  Unchanged bones.  IMPRESSION: 1.  Stable positioning of support apparatus with tiny right apical pneumothorax. 2.  Postsurgical change of the right lung with volume loss and obscuration of the right hilum.   Original Report Authenticated By: Tacey Ruiz, MD    Dg Chest Port 1 View  02/25/2013  *RADIOLOGY REPORT*  Clinical Data: Status post removal of right-sided chest tube.  PORTABLE CHEST - 1 VIEW  Comparison: Chest x-ray 02/25/2013.  Findings: Previously noted right-sided chest tube has been removed. A small right pneumothorax has increased in  size now occupying approximately 15% of the right hemithorax.  Postoperative changes of right upper lobectomy are noted.  Similar postoperative mass- like opacity in the right perihilar region may represent atelectatic lung or postoperative fluid collection (which may be loculated).  This appears slightly larger than the prior examination.  Left lung is clear.  No left pleural effusion.  Heart size is within normal limits.  Mediastinal contours are distorted by patient positioning. There is a right-sided subclavian central venous catheter with tip terminating in the mid superior vena cava. Small amount of subcutaneous emphysema in the right chest wall.  IMPRESSION: 1.  Status post removal of right-sided chest tube with slight enlargement of a right-sided pneumothorax, as above. 2.  Slight increase in size of right perihilar mass like opacity, favored to represent some loculated postoperative fluid. 3.  Additional findings, as above, similar to prior studies.  These results were called by telephone on 02/25/2013 at 07:00 p.m. to nurse Alphonzo Lemmings, who verbally acknowledged these results.   Original Report Authenticated By: Trudie Reed, M.D.    Dg Chest Port 1 View  02/24/2013  *RADIOLOGY REPORT*  Clinical Data: Chest tube management.  Postop day three, status post right upper lobectomy through VATS revealing adenocarcinoma.  PORTABLE CHEST - 1 VIEW  Comparison: 02/23/2013  Findings: The patient is rotated to the right on today's exam, resulting in reduced diagnostic sensitivity and specificity.  A single right-sided chest tube remains.  Right central line tip projects over the SVC.  Right hilar density noted.  There is mild residual subsegmental atelectasis along the left hemidiaphragm.  Minimal subcutaneous emphysema on the right.  A button projects over the right chest. No pneumothorax.  Degenerative left glenohumeral arthropathy noted.  Thoracic spondylosis noted.  IMPRESSION:  1.  Stable prominence the right  hilum, of uncertain significance, query localized hematoma or atelectasis.  This may be exaggerated by the degree of rightward rotation but appears similar to prior. 2.  One of the right-sided chest tubes has been removed.  No pneumothorax. 3.  Low lung volumes with subsegmental atelectasis along the left hemidiaphragm.   Original Report Authenticated By: Gaylyn Rong, M.D.      Assessment/Plan: S/P Procedure(s) (LRB): Right VIDEO ASSISTED THORACOSCOPY ,Thoracotomy with right upper lobectomy, node sampling  (Right) VIDEO BRONCHOSCOPY (N/A) CYSTOSCOPY FLEXIBLE with insertion of foley catheter (N/A)  1 will obtain UA/C+S prior to discharge 2 encourage Incentive spirom 3 D/W Dr Tyrone Sage- ok to discharge    Alvin Hardy,Alvin Hardy 02/26/2013 7:55 AM

## 2013-02-26 NOTE — Progress Notes (Signed)
Pt discharged per MD order. Discharge instructions were reviewed and all questions answered. Surgical site clean, dry, and intact without signs of infection. Subclavian IV was removed per protocol, pt tolerated procedure well.   

## 2013-02-26 NOTE — Progress Notes (Signed)
20ml or of PCA fentanyl was wasted in sink, IT consultant to witness waste.

## 2013-02-27 ENCOUNTER — Other Ambulatory Visit: Payer: Self-pay | Admitting: *Deleted

## 2013-02-27 ENCOUNTER — Telehealth: Payer: Self-pay | Admitting: *Deleted

## 2013-02-27 DIAGNOSIS — N390 Urinary tract infection, site not specified: Secondary | ICD-10-CM

## 2013-02-27 MED ORDER — SULFAMETHOXAZOLE-TRIMETHOPRIM 800-160 MG PO TABS: 1.0000 | ORAL_TABLET | Freq: Two times a day (BID) | ORAL | Status: DC

## 2013-02-27 NOTE — Telephone Encounter (Signed)
Mrs. Canizalez called concerned with the drainage from one of his chest tube sutured sites.  It is slightly blood tinged and has been draining since discharge yesterday.  I explained that this was not unusual and that we preferred that it drain rather than staying inside the pleural cavity.  I also explained that it might drain for several days, depending on the person, and not to be alarmed. I will call them again to check on his status and she understood.

## 2013-02-27 NOTE — Op Note (Deleted)
NAME:  Alvin Hardy, Alvin Hardy               ACCOUNT NO.:  [id number]  MEDICAL RECORD NO.:  [id number]  LOCATION:  [facility]                        FACILITY:  [facility]  PHYSICIAN:  Danette Weinfeld, MD    DATE OF BIRTH:  01/08/1935  DATE OF PROCEDURE:  02/21/2013 DATE OF DISCHARGE:  02/26/2013                              OPERATIVE REPORT   PREOPERATIVE DIAGNOSIS:  Right upper lobe lung mass suspicious for malignancy.  POSTOPERATIVE DIAGNOSIS:  Right upper lobe lung mass suspicious for malignancy.  Adenocarcinoma of the lung of the right upper lobe by frozen section.  PROCEDURE PERFORMED:  Bronchoscopy, right upper lobe.  Bronchoscopy right video-assisted thoracoscopy, mini thoracotomy and right upper lobe resection with lymph node dissection and placement of On-Q device.  SURGEON:  Locklyn Henriquez, MD  FIRST ASSISTANT:  Erin Barrett, PA  BRIEF HISTORY:  The patient is a 77-year-old male, who was a distant smoker, presented to his primary care physician with some cough and "breaking of his voice" thought possibly to be related to reflux.  Chest x-ray was obtained which demonstrated a 3-cm right upper lobe lung mass. Preoperatively, pulmonary function studies, MRI of the brain, CT of the chest, and PET scan were performed.  Clinically staging as the T2N0M0 malignancy.  The presumptive diagnosis was discussed with the patient and detailed risks and options of treatment were discussed, and surgical resection was recommended.  The patient agreed and signed informed consent.  DESCRIPTION OF PROCEDURE:  The patient underwent general endotracheal anesthesia without incident.  A single-lumen endotracheal tube was placed and through this tube, fiberoptic bronchoscope was passed to the subsegmental level, both left and right tracheobronchial tree without evidence of endobronchial lesions.  The scope was removed.  The double- lumen endotracheal tube was placed and the patient turned into  lateral decubitus position with the right side up.  The second time-out confirming side and the patient was performed, the right chest was prepped with Betadine and draped in sterile manner.  Initially, the port site was made in the midaxillary line approximately the fourth intercostal space and a 30-degree scope was placed into the videoscope was placed into the chest.  Survey of the chest did not reveal any evidence of pleural metastasis.  There are a few minor adhesions, although we do not have a preoperative tissue diagnosis and the size of the lesion in a somewhat central location, we decided to proceed with lobectomy.  Additional port site and a small utilitarian thoracotomy incision was created through the ports and the incision.  We proceeded with formal right upper lobectomy, dissecting anteriorly along the hilum of the veins draining the upper lobe were encircled and divided with a vascular stapler.  As the dissection procedure forwarded, we identified 3 separate pulmonary artery branches to the right upper lobe.  Each of these was divided with a vascular stapler.  The major fissure was almost complete, the fissure was not complete.  With the pulmonary branches, arterial branches divided.  The bronchus to the right upper lobe was identified.  The black stapler was used to cross the right upper lobe bronchus with the bronchus clamp was before firing the inflation at the middle and lower lobe   confirmed.  The bronchus was then divided with the hilum free.  We then proceeded to use a serial firings a Purple stapler to complete the minor fissure.  The lung specimen now free was then placed in an Endobag and brought out through the incision.  We then proceeded with dissecting lymph nodes, none of the lymph nodes identified were particularly enlarged, and a small 2R and 4R lymph nodes were identified, sent to pathology in a separate cups.  The bronchial irrigation was placed in the  chest and the bronchial was stump tested without evidence of air leak.  Two 28-chest tubes were placed through the port sites.  A On-Q device was then tunneled through a separate stab in the posterior chest and tunneled subpleurally and secured in place. The single pericostal stitch was placed through the rib, a small hole was then drilled in the lower rib and the incision was closed with interrupted 0 Vicryl in the muscle layers and a running 2-0 Vicryl in the subcutaneous tissue, and 3-0 subcuticular stitch.  Dermabond was placed.  The sponge and needle count was reported as correct at completion of the procedure.  The patient tolerated the procedure without obvious complication.  Frozen section confirmed negative margins and probable adenocarcinoma as the primary tumor.  Estimated blood loss was less than 100 mL.  The patient was awakened and extubated in the operating room and transferred to the recovery room for postoperative observation.     Rudi Bunyard, MD     EG/MEDQ  D:  02/26/2013  T:  02/27/2013  Job:  [id number] 

## 2013-02-28 LAB — URINE CULTURE
Colony Count: >=100000
Special Requests: NORMAL

## 2013-03-01 ENCOUNTER — Other Ambulatory Visit: Payer: Self-pay | Admitting: *Deleted

## 2013-03-01 ENCOUNTER — Telehealth: Payer: Self-pay | Admitting: *Deleted

## 2013-03-01 DIAGNOSIS — N390 Urinary tract infection, site not specified: Secondary | ICD-10-CM

## 2013-03-01 MED ORDER — AMPICILLIN 250 MG PO CAPS: 250.0000 mg | ORAL_CAPSULE | Freq: Four times a day (QID) | ORAL | Status: DC

## 2013-03-01 NOTE — Telephone Encounter (Signed)
Spoke with pt regarding appt for mtoc 03/08/13.  He verbalized understanding of time and place of appt

## 2013-03-01 NOTE — Telephone Encounter (Signed)
I called and talked with Alvin Hardy concerning his complaint of chest tube drainage 2 days ago after hospital discharge.  He said that as of today there has no further drainage..idiopathic thrombocytopenic purpura has stopped.  I also discussed the management of his UTI and said the previous antibiotic that was prescribed is resistant to the bacteria that was cultured.  I told him to stop the antibiotic he has now and go to the pharmacy  and start the new antibiotic I am calling in today.  He understood and agreed.Marland Kitchen

## 2013-03-05 NOTE — Op Note (Signed)
NAMEBUTLER, VEGH NO.:  [id number]  MEDICAL RECORD NO.:  [id number]  LOCATION:  [facility]                        FACILITY:  [facility]  PHYSICIAN:  Sheliah Plane, MD    DATE OF BIRTH:  Apr 23, 1935  DATE OF PROCEDURE:  02/21/2013 DATE OF DISCHARGE:  02/26/2013                              OPERATIVE REPORT   PREOPERATIVE DIAGNOSIS:  Right upper lobe lung mass suspicious for malignancy.  POSTOPERATIVE DIAGNOSIS:  Right upper lobe lung mass suspicious for malignancy.  Adenocarcinoma of the lung of the right upper lobe by frozen section.  PROCEDURE PERFORMED:  Bronchoscopy, right upper lobe.  Bronchoscopy right video-assisted thoracoscopy, mini thoracotomy and right upper lobe resection with lymph node dissection and placement of On-Q device.  SURGEON:  Sheliah Plane, MD  FIRST ASSISTANT:  Lowella Dandy, PA  BRIEF HISTORY:  The patient is a 78 year old male, who was a distant smoker, presented to his primary care physician with some cough and "breaking of his voice" thought possibly to be related to reflux.  Chest x-ray was obtained which demonstrated a 3-cm right upper lobe lung mass. Preoperatively, pulmonary function studies, MRI of the brain, CT of the chest, and PET scan were performed.  Clinically staging as the T2N0M0 malignancy.  The presumptive diagnosis was discussed with the patient and detailed risks and options of treatment were discussed, and surgical resection was recommended.  The patient agreed and signed informed consent.  DESCRIPTION OF PROCEDURE:  The patient underwent general endotracheal anesthesia without incident.  A single-lumen endotracheal tube was placed and through this tube, fiberoptic bronchoscope was passed to the subsegmental level, both left and right tracheobronchial tree without evidence of endobronchial lesions.  The scope was removed.  The double- lumen endotracheal tube was placed and the patient turned into  lateral decubitus position with the right side up.  The second time-out confirming side and the patient was performed, the right chest was prepped with Betadine and draped in sterile manner.  Initially, the port site was made in the midaxillary line approximately the fourth intercostal space and a 30-degree scope was placed into the videoscope was placed into the chest.  Survey of the chest did not reveal any evidence of pleural metastasis.  There are a few minor adhesions, although we do not have a preoperative tissue diagnosis and the size of the lesion in a somewhat central location, we decided to proceed with lobectomy.  Additional port site and a small utilitarian thoracotomy incision was created through the ports and the incision.  We proceeded with formal right upper lobectomy, dissecting anteriorly along the hilum of the veins draining the upper lobe were encircled and divided with a vascular stapler.  As the dissection procedure forwarded, we identified 3 separate pulmonary artery branches to the right upper lobe.  Each of these was divided with a vascular stapler.  The major fissure was almost complete, the fissure was not complete.  With the pulmonary branches, arterial branches divided.  The bronchus to the right upper lobe was identified.  The black stapler was used to cross the right upper lobe bronchus with the bronchus clamp was before firing the inflation at the middle and lower lobe  confirmed.  The bronchus was then divided with the hilum free.  We then proceeded to use a serial firings a Purple stapler to complete the minor fissure.  The lung specimen now free was then placed in an Endobag and brought out through the incision.  We then proceeded with dissecting lymph nodes, none of the lymph nodes identified were particularly enlarged, and a small 2R and 4R lymph nodes were identified, sent to pathology in a separate cups.  The bronchial irrigation was placed in the  chest and the bronchial was stump tested without evidence of air leak.  Two 28-chest tubes were placed through the port sites.  A On-Q device was then tunneled through a separate stab in the posterior chest and tunneled subpleurally and secured in place. The single pericostal stitch was placed through the rib, a small hole was then drilled in the lower rib and the incision was closed with interrupted 0 Vicryl in the muscle layers and a running 2-0 Vicryl in the subcutaneous tissue, and 3-0 subcuticular stitch.  Dermabond was placed.  The sponge and needle count was reported as correct at completion of the procedure.  The patient tolerated the procedure without obvious complication.  Frozen section confirmed negative margins and probable adenocarcinoma as the primary tumor.  Estimated blood loss was less than 100 mL.  The patient was awakened and extubated in the operating room and transferred to the recovery room for postoperative observation.     Sheliah Plane, MD     EG/MEDQ  D:  02/26/2013  T:  02/27/2013  Job:  [id number]

## 2013-03-06 ENCOUNTER — Encounter: Payer: Self-pay | Admitting: Cardiothoracic Surgery

## 2013-03-07 ENCOUNTER — Telehealth: Payer: Self-pay | Admitting: Internal Medicine

## 2013-03-07 NOTE — Telephone Encounter (Signed)
C/D 03/07/13 for appt. 03/08/13 °

## 2013-03-08 ENCOUNTER — Ambulatory Visit
Admission: RE | Admit: 2013-03-08 | Discharge: 2013-03-08 | Disposition: A | Discharge status: Home / Self Care | Payer: Medicare Other | Source: Ambulatory Visit | Attending: Radiation Oncology | Admitting: Radiation Oncology

## 2013-03-08 ENCOUNTER — Encounter: Payer: Self-pay | Admitting: *Deleted

## 2013-03-08 ENCOUNTER — Telehealth: Payer: Self-pay | Admitting: Internal Medicine

## 2013-03-08 ENCOUNTER — Ambulatory Visit (HOSPITAL_BASED_OUTPATIENT_CLINIC_OR_DEPARTMENT_OTHER): Payer: Medicare Other | Admitting: Internal Medicine

## 2013-03-08 DIAGNOSIS — C771 Secondary and unspecified malignant neoplasm of intrathoracic lymph nodes: Secondary | ICD-10-CM

## 2013-03-08 DIAGNOSIS — C3410 Malignant neoplasm of upper lobe, unspecified bronchus or lung: Secondary | ICD-10-CM

## 2013-03-08 MED ORDER — CYANOCOBALAMIN 1000 MCG/ML IJ SOLN
1000.0000 ug | Freq: Once | INTRAMUSCULAR | Status: AC
  Administered 2013-03-08: 1000 ug via INTRAMUSCULAR

## 2013-03-08 NOTE — Op Note (Signed)
Antony Haste, MD Physician Signed Urology Progress Notes Service date: 02/21/2013 10:18 AM   Patient ID: Alvin Hardy, male DOB: 1935-10-19, 78 y.o. MRN: [id number]  Urology procedure note  Preop diagnosis: BPH, difficult Foley  Postop diagnosis: Same  Procedure: Cystoscopy, Foley placement over wire with Seldinger technique  Surgeon: Iori Gigante  Type of anesthesia: Gen.  Findings:  On cystoscopy the urethra appeared normal. The prostatic urethra had significant nodular regrowth of BPH with 100% visual obstruction. The bladder neck appeared elevated and required flexible scope to be advanced over a prostate nodule into the bladder. The bladder appeared normal without foreign body, stone or stitch. The mucosa appeared normal.  Description of procedure:  Patient was asleep in the OR and several attempts at then made a Foley catheter placement. The patient had been previously prepped and draped for Foley placement therefore a flexible cystoscope was advanced per urethra. Once in the bladder a sensor wire was coiled in the bladder. The scope was removed and over the wire and [employer] [place] [employer] tip catheter was advanced. The wire was removed and the Foley drained appropriately. The balloon was inflated and seated at the bladder neck.and the catheter connected to gravity drainage.  Complications: None  Blood loss: Minimal  Specimens: None  Drains: 16 French Foley  Disposition: Patient turned back over to Dr. Tyrone Sage for his procedure.    This was copied and pasted from original op note.

## 2013-03-08 NOTE — Progress Notes (Signed)
[facility] [phone]   [phone] Multidisciplinary thoracic oncology clinic (MTOC)  CONSULT NOTE  REFERRING PHYSICIAN: Dr. Sheliah Plane  REASON FOR CONSULTATION:  78 years old white male recently diagnosed with lung cancer.  HPI Alvin Hardy is a 78 y.o. male with remote history of smoking and past medical history significant for dyslipidemia, bipolar disorder, GERD, benign prostatic hypertrophy, and erectile dysfunction. The patient mentions that few weeks ago he has been complaining of headaches in his face as well as cold symptoms and congestion. He was seen by his primary care physician and CT scan of the neck was performed on 02/07/2013. It showed a spiculated right apical lung nodule/mass encompressing 3.8 CM in greatest diameter. This was most compatible with a right lung bronchogenic carcinoma. There is no right paratracheal were visible hilar/mediastinal lymphadenopathy identified. There was no neck masses or lymphadenopathy identified. The patient was referred to Dr. Tyrone Sage and PET scan was performed on 02/14/2013 and it showed hypermetabolic right apical lung mass, most consistent with primary bronchogenic carcinoma. No thoracic nodal or extra thoracic metastasis. MRI of the brain was performed on 02/20/2013 and it was negative with no acute or metastatic intracranial abnormality. On 02/27/2013 the patient underwent bronchoscopy, right VATS, mini thoracotomy with right upper lobe resection with lymph node dissection under the care of Dr. Tyrone Sage. The final pathology (Accession #: [id number]) showed adenocarcinoma measuring 3.4 CM with negative resection margin but there was metastatic adenocarcinoma in 4R lymph node. The tissue blocks were sent for EGFR mutation as well as ALK gene translocation and the test was positive for EGFR mutation in exon 19. Dr. Tyrone Sage kindly referred the patient to me today for further evaluation and  recommendation regarding adjuvant chemotherapy. When seen today the patient continues to have some soreness in the right side of the chest at the surgical site as well as shortness breath with exertion and mild cough but no hemoptysis. He lost around 8 pounds over the last 2 weeks. He denied having any significant nausea, vomiting, diarrhea or constipation. He denied having any visual changes or headache. Family history significant for mother with diabetes mellitus and stomach cancer. Father had heart disease and a sister with diabetes mellitus. The patient is married and has 2 sons and 3 stepchildren. He was accompanied by his wife Alvin Hardy today. He is a retired Biomedical scientist. He has a remote history of smoking but quit long time ago. He drinks alcohol occasionally and no history of drug abuse.  @SFHPI @  Past Medical History  Diagnosis Date  . BPH (benign prostatic hyperplasia)   . Hypogonadism male   . Bipolar 1 disorder   . ED (erectile dysfunction)   . Hyperlipidemia   . Neuromuscular disorder     peripheral neuropathy  . Cataract 2010    Bilateral  . Laryngopharyngeal reflux   . Gilbert's syndrome   . Deviated septum     TO THE LEFT  . Morton's neuroma     LEFT FOOT  . Lung mass 02/07/13    RIGHT UPPER LOBE  . Shortness of breath     voice breaks, ? related to reflux, although told by Dr. Christella Hartigan, S.- early 89 yr. old  . GERD (gastroesophageal reflux disease)   . Cancer     lung  . Arthritis     psoriatic arthritis, ? , treated /w mmethotrexate   . Finger injury     mallet finger-- 02/13/2013, cast in place, followed by dr.  Merlyn Lot    Past Surgical History  Procedure Laterality Date  . Prostate surgery  1996    TURP  . Knee arthroscopy Right 01/2007  . Pilonidal cyst excision  1960's  . Eye surgery      cataracts removed fr. both eyes, IOL in place   . Video assisted thoracoscopy (vats)/wedge resection Right 02/21/2013    Procedure: Right VIDEO ASSISTED THORACOSCOPY  ,Thoracotomy with right upper lobectomy, node sampling ;  Surgeon: Delight Ovens, MD;  Location: [facility] OR;  Service: Thoracic;  Laterality: Right;  . Video bronchoscopy N/A 02/21/2013    Procedure: VIDEO BRONCHOSCOPY;  Surgeon: Delight Ovens, MD;  Location: [facility] OR;  Service: Thoracic;  Laterality: N/A;  . Cystoscopy N/A 02/21/2013    Procedure: CYSTOSCOPY FLEXIBLE with insertion of foley catheter;  Surgeon: Antony Haste, MD;  Location: [facility] OR;  Service: Urology;  Laterality: N/A;    Family History  Problem Relation Age of Onset  . Diabetes Mother   . Heart disease Father   . Diabetes Son     Social History History  Substance Use Topics  . Smoking status: Former Smoker    Types: Cigarettes    Quit date: 11/01/1978  . Smokeless tobacco: Not on file  . Alcohol Use: Yes     Comment: occas.    Allergies  Allergen Reactions  . Codeine Anaphylaxis  . Other     Pt. Remarks that all pain med. Make him nauseated   . Zoloft (Sertraline Hcl) Other (See Comments)    REACTION:  unknown    Current Outpatient Prescriptions  Medication Sig Dispense Refill  . aspirin EC 81 MG tablet Take 81 mg by mouth daily.      . calcipotriene (DOVONOX) 0.005 % cream Apply 1 application topically daily as needed (for hands).       . Calcium Carbonate (CALCIUM 600 PO) Take 1 tablet by mouth daily.      . Cholecalciferol (VITAMIN D-3) 1000 UNITS CAPS Take 1,000 Units by mouth daily.       . clobetasol cream (TEMOVATE) 0.05 % Apply 1 application topically daily.       . clonazePAM (KLONOPIN) 0.5 MG tablet Take 0.5 mg by mouth at bedtime.      . colesevelam (WELCHOL) 625 MG tablet Take 3,750 mg by mouth daily with supper.      . cyanocobalamin 1000 MCG tablet Take 1,000 mcg by mouth daily.      Marland Kitchen esomeprazole (NEXIUM) 40 MG capsule Take 40 mg by mouth daily before breakfast.      . finasteride (PROSCAR) 5 MG tablet Take 5 mg by mouth daily before breakfast.       . folic acid (FOLVITE) 1 MG  tablet Take 1 mg by mouth daily.      Marland Kitchen gabapentin (NEURONTIN) 300 MG capsule Take 600-900 mg by mouth 2 (two) times daily. Takes 2 capsules every morning, 300mg  x3 at night      . glucosamine-chondroitin 500-400 MG tablet Take 1 tablet by mouth 2 (two) times daily.       Marland Kitchen lithium carbonate (LITHOBID) 300 MG CR tablet Take 300 mg by mouth at bedtime.      . Multiple Vitamins-Minerals (MULTIVITAMIN PO) Take 2 tablets by mouth daily.       Marland Kitchen testosterone cypionate (DEPOTESTOTERONE CYPIONATE) 100 MG/ML injection Inject 100 mg into the muscle every Friday. For IM use only      . propranolol (INDERAL) 10 MG tablet Take  10-20 mg by mouth daily as needed (Takes rarely, only under extremely stressful circumstances.).       Marland Kitchen vardenafil (LEVITRA) 5 MG tablet Take 5 mg by mouth daily as needed for erectile dysfunction.        Current Facility-Administered Medications  Medication Dose Route Frequency Provider Last Rate Last Dose  . cyanocobalamin ((VITAMIN B-12)) injection 1,000 mcg  1,000 mcg Intramuscular Once Si Gaul, MD        Review of Systems  A comprehensive review of systems was negative except for: Constitutional: positive for fatigue Respiratory: positive for cough, dyspnea on exertion and pleurisy/chest pain  Physical Exam  WGN:FAOZH, healthy, no distress, well nourished and well developed SKIN: skin color, texture, turgor are normal HEAD: Normocephalic, No masses, lesions, tenderness or abnormalities EYES: normal EARS: External ears normal OROPHARYNX:no exudate and no erythema  NECK: supple, no adenopathy LYMPH:  no palpable lymphadenopathy, no hepatosplenomegaly LUNGS: clear to auscultation  HEART: regular rate & rhythm, no murmurs and no gallops ABDOMEN:abdomen soft, non-tender, normal bowel sounds and no masses or organomegaly BACK: Back symmetric, no curvature. EXTREMITIES:no joint deformities, effusion, or inflammation, no edema, no skin discoloration, no clubbing    NEURO: alert & oriented x 3 with fluent speech, no focal motor/sensory deficits  PERFORMANCE STATUS: ECOG 1  LABORATORY DATA: Lab Results  Component Value Date   WBC 13.6* 02/23/2013   HGB 13.2 02/23/2013   HCT 37.0* 02/23/2013   MCV 89.6 02/23/2013   PLT 171 02/23/2013      Chemistry      Component Value Date/Time   NA 138 02/23/2013 0419   K 3.9 02/23/2013 0419   CL 104 02/23/2013 0419   CO2 28 02/23/2013 0419   BUN 9 02/23/2013 0419   CREATININE 0.89 02/23/2013 0419      Component Value Date/Time   CALCIUM 8.8 02/23/2013 0419   ALKPHOS 43 02/23/2013 0419   AST 17 02/23/2013 0419   ALT 12 02/23/2013 0419   BILITOT 1.2 02/23/2013 0419       RADIOGRAPHIC STUDIES: Dg Chest 2 View  02/26/2013  *RADIOLOGY REPORT*  Clinical Data: 78 year old male status post chest tube removal. Right upper lobectomy.  Right upper lung cancer.  CHEST - 2 VIEW  Comparison: 02/25/2013 and earlier.  Findings: Stable small right pneumothorax visible at the apex and to an extent that on the side of the right hemithorax.  Stable right subclavian central line.  Stable right hilar/mediastinal increased density and contour deformity.  Superimposed surgical clips.  Continued low lung volumes.  Small bilateral pleural effusions.  No confluent opacity in the right lung. Visualized tracheal air column is within normal limits.  Small volume right chest wall subcutaneous gas. Stable visualized osseous structures.  IMPRESSION: 1.  Stable small right lung pneumothorax. 2.  Continued increased density and rounded contour at the right hilum.  Postoperative fluid or hematoma may be present.  Chest CT (IV contrast preferred) would confirm. 3.  Continued low lung volumes.  Small bilateral pleural effusions.   Original Report Authenticated By: Alvin Speed, M.D.    Dg Chest 2 View  02/25/2013  *RADIOLOGY REPORT*  Clinical Data: Evaluate right-sided chest tube  CHEST - 2 VIEW  Comparison: 02/24/2013; 02/20/2013; PET CT - 02/14/2013   Findings:  Grossly unchanged cardiac silhouette and mediastinal contours with obscuration of the right heart border secondary to partial right upper lobe atelectasis.  Surgical clips are seen about the right hilum.  Stable positioning of support apparatus with  likely unchanged tiny right apical pneumothorax.  Postsurgical changes of the lateral aspect of the right fifth rib.  Interval decrease in minimal amount of residual right lateral chest wall subcutaneous emphysema.  No new focal airspace opacities.  Unchanged bones.  IMPRESSION: 1.  Stable positioning of support apparatus with tiny right apical pneumothorax. 2.  Postsurgical change of the right lung with volume loss and obscuration of the right hilum.   Original Report Authenticated By: Tacey Ruiz, MD    Dg Chest 2 View  02/20/2013  *RADIOLOGY REPORT*  Clinical Data: Preoperative evaluation for right lung wedge resection, shortness of breath, lung mass  CHEST - 2 VIEW  Comparison: None Correlation: PET CT 02/14/2013  Findings: Normal heart size, mediastinal contours, pulmonary vascularity. Poorly defined 2.2 cm diameter density identified right upper lobe, superimposed with medial right clavicle, corresponding to lung mass identified on prior PET CT. Lungs appear emphysematous with minimal central peribronchial thickening. Bilateral nipple shadows. No pulmonary infiltrate, pleural effusion or pneumothorax. Additional questionable nodular density at the left lung base noted, 7 mm diameter, without identification of a pulmonary nodule at this site on the preceding CT, question artifact.  IMPRESSION: COPD changes with a 2.2 cm diameter poorly defined right apical opacity corresponding to the mass seen on prior PET CT. No acute abnormalities.   Original Report Authenticated By: Ulyses Southward, M.D.    Ct Soft Tissue Neck W Contrast  02/07/2013  *RADIOLOGY REPORT*  Clinical Data: 78 year old male with painless palpable abnormality under the right year.   Physician palpable abnormality left neck. Intermittent hoarseness.  BUN and creatinine were obtained on site at [facility] at [address] Results:  BUN 21 mg/dL,  Creatinine 0.9 mg/dL.  CT NECK WITH CONTRAST  Technique:  Multidetector CT imaging of the neck was performed with intravenous contrast.  Contrast: 75mL OMNIPAQUE IOHEXOL 300 MG/ML  SOLN  Comparison: Report of the neck CT 12/03/2002 (no images available).  Findings: In the visible lung apices there is a solid right upper lung lesion with irregular, spiculated margins encompassing 31 x 38 x 27 mm.  Some linear spiculation from lesion extends to the apical pleura which may be superimposed scarring.  No right paratracheal or visible right hilar lymphadenopathy.  No thoracic inlet lymphadenopathy identified.  In the neck a skin marker was placed on the right side and is seen on series 2 image 29.  The this corresponds to the inferior aspect of the right parotid gland which appears normal.  No regional lymphadenopathy.  No soft tissue mass in this region.  No left side marker is identified to indicate the area of palpable abnormality.  Pharynx, parapharyngeal spaces, retropharyngeal space, sublingual space, submandibular glands, and parotid glands are within normal limits.  There is mild asymmetry of the larynx which could indicate right vocal cord paralysis.  However, laryngeal asymmetry was also described on the comparison.  The thyroid is normal except for several sub-centimeter hypodense nodules in the left lobe, too small to characterize the most likely benign in the absence of risk factors for thyroid carcinoma.  Major vascular structures in the neck are patent.  Left greater than right carotid bifurcation atherosclerosis.  Negative visualized brain parenchyma.  No cervical lymphadenopathy.  Visualized paranasal sinuses and mastoids are clear.  Advanced degenerative changes in the cervical spine.  No suspicious osseous lesion identified in  the neck.  Scoliosis in the thorax.  IMPRESSION:  1.  Spiculated right apical lung nodule/mass encompassing 38 mm greatest  dimension.  This is most compatible with a right lung bronchogenic carcinoma. No right paratracheal or visible hilar/mediastinal lymphadenopathy identified. 2.  No neck mass or lymphadenopathy identified.  Study discussed by telephone with  Dr. Rodrigo Ran on 02/07/2013 at 1530 hours.   Original Report Authenticated By: Alvin Speed, M.D.    Mr Laqueta Jean Wo Contrast  02/20/2013  *RADIOLOGY REPORT*  Clinical Data: 78 year old male with lung mass.  Staging.  Headache and dizziness.  MRI HEAD WITHOUT AND WITH CONTRAST  Technique:  Multiplanar, multiecho pulse sequences of the brain and surrounding structures were obtained according to standard protocol without and with intravenous contrast  Contrast: 17mL MULTIHANCE GADOBENATE DIMEGLUMINE 529 MG/ML IV SOLN  Comparison: Neck CT 02/07/2013.  Findings: Small developmental venous anomaly of left anterior frontal lobe. No abnormal enhancement identified.  No midline shift, mass effect, or evidence of mass lesion.  No restricted diffusion to suggest acute infarction.  No ventriculomegaly. No acute intracranial hemorrhage identified. Major intracranial vascular flow voids the are preserved.  Dominant distal left vertebral artery.   Negative pituitary and cervicomedullary junction.  Negative visualized cervical spine except for degenerative changes. Wallace Cullens and white matter signal is within normal limits throughout the brain.  Normal bone marrow signal.  Postoperative changes to both globes. Visualized paranasal sinuses and mastoids are clear.  Negative scalp soft tissues.  IMPRESSION: Negative. No acute or metastatic intracranial abnormality.   Original Report Authenticated By: Alvin Speed, M.D.    Nm Pet Image Initial (pi) Skull Base To Thigh  02/14/2013  *RADIOLOGY REPORT*  Clinical Data: Initial treatment strategy for right apical lung mass on prior  neck CT.  NUCLEAR MEDICINE PET SKULL BASE TO THIGH  Fasting Blood Glucose:  98  Technique:  19.3 mCi F-18 FDG was injected intravenously. CT data was obtained and used for attenuation correction and anatomic localization only.  (This was not acquired as a diagnostic CT examination.) Additional exam technical data entered on technologist worksheet.  Comparison:  Neck CT 02/08/1945.  Abdominal pelvic CT from [facility] dated 01/25/2013.  Findings:  Neck:  No abnormal hypermetabolism.  Chest:  Corresponding to the previously described abnormality, within the right apex, is a spiculated mass which measures 3.5 x 2.7 cm and a S.U.V. max of 4.9 on image 78/series 2.  Spiculation contacts the posterior right apical pleura and extends to the superior aspect of the right major fissure.  No abnormal nodal activity within the chest.  Abdomen/Pelvis:  No abnormal hypermetabolism.  Skeleton:  No evidence of hypermetabolic osseous lesions.  CT  images performed for attenuation correction demonstrate no cervical adenopathy.  Mild cardiomegaly, without pericardial effusion.  Tiny right and trace left pleural fluid  Normal adrenal glands.  Right nephrolithiasis.  Bilateral fluid density renal lesions which are likely cysts.  Old granulomatous disease in the spleen.  Normal urinary bladder.  Moderate prostatomegaly.  Tiny fat containing left inguinal hernia.  IMPRESSION:  1.  Hypermetabolic right apical lung mass, most consistent with primary bronchogenic carcinoma. No thoracic nodal or extrathoracic metastasis.  Presuming non-small cell histology, T2aN0M0 or stage IB. 2.  Trace right greater than left bilateral pleural effusions. 3.  Incidental findings, including right nephrolithiasis and prostatomegaly.   Original Report Authenticated By: Jeronimo Greaves, M.D.    Dg Chest Port 1 View  02/25/2013  *RADIOLOGY REPORT*  Clinical Data: Status post removal of right-sided chest tube.  PORTABLE CHEST - 1 VIEW  Comparison: Chest x-ray  02/25/2013.  Findings: Previously noted  right-sided chest tube has been removed. A small right pneumothorax has increased in size now occupying approximately 15% of the right hemithorax.  Postoperative changes of right upper lobectomy are noted.  Similar postoperative mass- like opacity in the right perihilar region may represent atelectatic lung or postoperative fluid collection (which may be loculated).  This appears slightly larger than the prior examination.  Left lung is clear.  No left pleural effusion.  Heart size is within normal limits.  Mediastinal contours are distorted by patient positioning. There is a right-sided subclavian central venous catheter with tip terminating in the mid superior vena cava. Small amount of subcutaneous emphysema in the right chest wall.  IMPRESSION: 1.  Status post removal of right-sided chest tube with slight enlargement of a right-sided pneumothorax, as above. 2.  Slight increase in size of right perihilar mass like opacity, favored to represent some loculated postoperative fluid. 3.  Additional findings, as above, similar to prior studies.  These results were called by telephone on 02/25/2013 at 07:00 p.m. to nurse Alphonzo Lemmings, who verbally acknowledged these results.   Original Report Authenticated By: Trudie Reed, M.D.    Dg Chest Port 1 View  02/24/2013  *RADIOLOGY REPORT*  Clinical Data: Chest tube management.  Postop day three, status post right upper lobectomy through VATS revealing adenocarcinoma.  PORTABLE CHEST - 1 VIEW  Comparison: 02/23/2013  Findings: The patient is rotated to the right on today's exam, resulting in reduced diagnostic sensitivity and specificity.  A single right-sided chest tube remains.  Right central line tip projects over the SVC.  Right hilar density noted.  There is mild residual subsegmental atelectasis along the left hemidiaphragm.  Minimal subcutaneous emphysema on the right.  A button projects over the right chest. No pneumothorax.   Degenerative left glenohumeral arthropathy noted.  Thoracic spondylosis noted.  IMPRESSION:  1.  Stable prominence the right hilum, of uncertain significance, query localized hematoma or atelectasis.  This may be exaggerated by the degree of rightward rotation but appears similar to prior. 2.  One of the right-sided chest tubes has been removed.  No pneumothorax. 3.  Low lung volumes with subsegmental atelectasis along the left hemidiaphragm.   Original Report Authenticated By: Gaylyn Rong, M.D.    Dg Chest Port 1 View  02/23/2013  *RADIOLOGY REPORT*  Clinical Data: Postop, chest tube  PORTABLE CHEST - 1 VIEW  Comparison: 02/22/2013  Findings: Postsurgical changes in the right hemithorax with associated volume loss and right perihilar fullness.  Two right apical chest tubes.  No pneumothorax is seen.  Mild subcutaneous emphysema along the right chest wall.  Low lung volumes.  Mild cardiomegaly.  Stable right subclavian venous catheter.  IMPRESSION: Postsurgical changes in the right hemithorax.  Two right apical chest tubes.  No pneumothorax is seen.   Original Report Authenticated By: Charline Bills, M.D.    Dg Chest Port 1 View  02/22/2013  *RADIOLOGY REPORT*  Clinical Data: Postoperative thoracotomy with chest tubes in place  Left lung is clear.  PORTABLE CHEST - 1 VIEW  Comparison:  February 21, 2013  Findings: Two chest tubes remain in place in the right.  There is a tiny apical pneumothorax.  There is a central catheter on the right with the tip either in or overlying the superior vena cava.  There remains a soft tissue prominence in the right hilum with what is felt to be partial collapse of the right upper lobe.  Adenopathy in the right hilar region cannot be excluded.  Lungs are otherwise clear.  Heart is upper normal in size with normal pulmonary vascularity. There is stable rib fractures on the right.  IMPRESSION: Minimal pneumothorax on the right with right-sided chest tubes unchanged in  position.  Soft tissue fullness in the right perihilar region remains, felt in large part to be due to focal lung collapse in this area. Left lung is clear.   Original Report Authenticated By: Bretta Bang, M.D.    Dg Chest Portable 1 View  02/21/2013  *RADIOLOGY REPORT*  Clinical Data: Status post VATS  PORTABLE CHEST - 1 VIEW  Comparison: Two-view chest 02/20/2013.  Findings: A right subclavian catheter terminates in the SVC.  To right-sided chest tubes are in place.  There is no pneumothorax. Patient is status post right upper lobectomy.  A right rib osteotomies noted.  Mild bibasilar atelectasis is worse on the left.  IMPRESSION:  1.  Postoperative changes of VATS and right upper lobectomy. 2.  Low lung volumes and bibasilar atelectasis. 3.  Two right-sided chest tubes are in place without evidence for significant pneumothorax.   Original Report Authenticated By: Marin Roberts, M.D.     ASSESSMENT: This is a very pleasant 78 years old white male recently diagnosed with a stage IIIa (T2 A., N2, M0) non-small cell lung cancer, adenocarcinoma with positive EGFR mutation in exon 19 and negative ALK gene translocation. The patient is status post right upper lobectomy with lymph node dissection.   PLAN: I have a lengthy discussion with the patient and his wife today about his disease stage, prognosis and treatment options. I recommended for the patient adjuvant chemotherapy with platinum-based regimen. I gave the patient the option of treatment with cisplatin 75 mg/M2 and Alimta 500 mg/M2 every 3 weeks. I discussed with the patient adverse effect of the chemotherapy including but not limited to alopecia, myelosuppression, nausea vomiting, peripheral neuropathy, liver or renal dysfunction as well as hearing deficit. The patient mentions that he plays piano regularly as a hobby and he also has baseline peripheral neuropathy and does not want to get worse. He is concerned about the toxicity and  peripheral neuropathy from cisplatin. I gave him the option of replacing cisplatin with carboplatin which would be milder regarding the adverse effects and infusion time. The patient agreed to proceed with treatment was carboplatin and Alimta. He will be treated with carboplatin for AUC of 5 and Alimta 500 mg/M2 every 3 weeks. First cycle expected on 03/27/2013. The patient will receive vitamin B12 injection today. I would also call his pharmacy was prescription for Compazine 10 mg by mouth every 6 hours as needed for nausea, folic acid 1 mg by mouth daily in addition to Decadron 4 mg by mouth twice a day the day before, day of and day after the chemotherapy.  The patient would have a chemotherapy education class before starting the first cycle of the chemotherapy. I also discussed the role of tyrosine kinase inhibitor and treatment of his condition and in the absence of any clinical trial or indication for treatment with Tarceva in the adjuvant setting, I would hold on giving him any treatment with the kinase inhibitor at this point and I would consider it in case she has any evidence for disease recurrence. The patient would also benefit from adjuvant radiotherapy to the mediastinum after completion of adjuvant chemotherapy because of the N2 disease. He'll be seen by Dr. Mitzi Hansen later today for evaluation and discussion of the adjuvant radiotherapy. The patient would come back for followup  visit with the start of the first cycle of his chemotherapy. I gave the patient and his wife the time to ask questions and I answered them completely to their satisfaction. The patient and his wife voice understanding of his disease, prognosis and treatment options and he would like to proceed with treatment as planned. He was advised to call immediately if he has any concerning symptoms. The patient and his wife were very pleased with the multidisciplinary approach they received today.  All questions were answered.  The patient knows to call the clinic with any problems, questions or concerns. We can certainly see the patient much sooner if necessary.  Thank you so much for allowing me to participate in the care of Alvin Hardy. I will continue to follow up the patient with you and assist in his care.  I spent 50 minutes counseling the patient face to face. The total time spent in the appointment was 80 minutes.  Zelpha Messing K. 03/08/2013, 3:06 PM

## 2013-03-08 NOTE — Telephone Encounter (Signed)
Gave pt appt for lab and MD on May 2th, emailed Marcelino Duster regarding chemo on May 2th and june 2014

## 2013-03-08 NOTE — Progress Notes (Signed)
[facility] Clinical Social Work  Clinical Social Work met with patient and patient's spouse in exam room during MTOC today.  Alvin Hardy reports feeling very confident in his healthcare team and the treatment plan that has been discussed.  He states he is not concerned about the cancer and has scored a 0 on his distress thermometer.  He states that he experiences some anxiety in his daily life and we discussed using his existing coping skills to support him through this cancer journey.  CSW briefly discussed CSW role and support programs with patient and spouse.    Kathrin Penner, MSW, LCSW Clinical Social Worker [facility] [phone]

## 2013-03-08 NOTE — Progress Notes (Signed)
   Thoracic Treatment Summary Name: Yon Schiffman Date: 03/08/13 DOB: Dec 12, 2034 Your Medical Team Medical Oncologist: Dr. Arbutus Ped Radiation Oncologist: Pulmonologist: Surgeon: Dr. Tyrone Sage Type and Stage of Lung Cancer Non-Small Cell Carcinoma: Adenocarcinoma Clinical Stage:  III Pathological Stage:  Clinical stage is based on radiology exams.  Pathological stage will be determined after surgery.  Staging is based on the size of the tumor, involvement of lymph nodes or not, and whether or not the cancer center has spread. Recommendations Recommendations: Chemo Class, 4 cycles of chemotherapy, radiation therapy  These recommendations are based on information available as of today's consult.  This is subject to change depending further testing or exams. Next Steps Next Step: 1. Set for Chemo Class 2. Set up for chemo Barriers to Care What do you perceive as a potential barrier that may prevent you from receiving your treatment plan? Financial concerns.  Pt will see financial advocate at end of visit.  Information given on lung cancer and resources given and explained Questions Willette Pa, RN BSN Thoracic Oncology Nurse Navigator at [phone]  Annabelle Harman is a nurse navigator that is available to assist you through your cancer journey.  She can answer your questions and/or provide resources regarding your treatment plan, emotional support, or financial concerns.

## 2013-03-08 NOTE — Telephone Encounter (Signed)
Talked to pt and gave him appt for labs, MD and chemo for May 2014 and june 201

## 2013-03-09 ENCOUNTER — Telehealth: Payer: Self-pay | Admitting: *Deleted

## 2013-03-09 ENCOUNTER — Encounter: Payer: Self-pay | Admitting: Internal Medicine

## 2013-03-09 NOTE — Patient Instructions (Signed)
You are recently diagnosed with a stage IIIa non-small cell lung cancer, adenocarcinoma. We discussed treatment options including adjuvant chemotherapy.

## 2013-03-09 NOTE — Telephone Encounter (Signed)
Pt called with questions regarding chemotherapy.  I spoke with Dr. Arbutus Ped regarding concerns and reassure Alvin Hardy.

## 2013-03-13 ENCOUNTER — Encounter: Payer: Self-pay | Admitting: Internal Medicine

## 2013-03-13 NOTE — Progress Notes (Signed)
Patient had left a message and I called him back. He had questions for insurance. I advised him to call them and make sure all has been billed.

## 2013-03-13 NOTE — Progress Notes (Signed)
Radiation Oncology         [phone] ________________________________  Name: Alvin Hardy MRN: [id number]  Date: 03/08/2013  DOB: [birth year]  WU:[id number],YNWG A, MD  Perini, Redge Gainer, MD    Velora Heckler. Mohamed M.D.  REFERRING PHYSICIAN: [name], MD   DIAGNOSIS: The encounter diagnosis was Lung cancer, upper lobe, unspecified laterality.   HISTORY OF PRESENT ILLNESS::Alvin Hardy is a 78 y.o. male who is seen for an initial consultation visit. The patient indicates that he began experiencing some symptoms of congestion and related to a likely cold. He was seen by his primary care physician and a CT scan of the neck was performed on 02/07/2013. This demonstrated a spiculated mass measuring 3.8 cm in diameter within the right apex. This was suspicious for a right lung bronchogenic carcinoma.  The patient had further workup including a PET scan. This showed that the spiculated mass was hypermetabolic with a maximum SUV of 4.9. No thoracic nodal or extrathoracic metastasis was present. This was consistent with a T2 he N0 M0 tumor. An MRI scan of the brain was also performed which was negative for intracranial disease.  The patient proceeded with a bronchoscopy, right VATS, mini thoracotomy with a right upper lobe resection and lymph node dissection. This revealed a 3.4 cm adenocarcinoma. The margins were negative. A total of 3 lymph nodes were examined with 1 N2 lymph node being positive for carcinoma at the 4R station. The patient therefore had a pathologic diagnosis of T2aN2 MX disease corresponding to stage IIIA non-small cell lung cancer.  The patient today indicates that he is doing well overall. He has lost about 8-10 pounds overall. He does have some soreness in the right chest at the resection site. He has some shortness of breath with exertion and a mild cough.     PREVIOUS RADIATION THERAPY: No   PAST MEDICAL HISTORY:  has a past medical history of BPH (benign prostatic  hyperplasia); Hypogonadism male; Bipolar 1 disorder; ED (erectile dysfunction); Hyperlipidemia; Neuromuscular disorder; Cataract (2010); Laryngopharyngeal reflux; Gilbert's syndrome; Deviated septum; Morton's neuroma; Lung mass (02/07/13); Shortness of breath; GERD (gastroesophageal reflux disease); Cancer; Arthritis; and Finger injury.     PAST SURGICAL HISTORY: Past Surgical History  Procedure Laterality Date  . Prostate surgery  1996    TURP  . Knee arthroscopy Right 01/2007  . Pilonidal cyst excision  1960's  . Eye surgery      cataracts removed fr. both eyes, IOL in place   . Video assisted thoracoscopy (vats)/wedge resection Right 02/21/2013    Procedure: Right VIDEO ASSISTED THORACOSCOPY ,Thoracotomy with right upper lobectomy, node sampling ;  Surgeon: Delight Ovens, MD;  Location: [facility] OR;  Service: Thoracic;  Laterality: Right;  . Video bronchoscopy N/A 02/21/2013    Procedure: VIDEO BRONCHOSCOPY;  Surgeon: Delight Ovens, MD;  Location: [facility] OR;  Service: Thoracic;  Laterality: N/A;  . Cystoscopy N/A 02/21/2013    Procedure: CYSTOSCOPY FLEXIBLE with insertion of foley catheter;  Surgeon: Antony Haste, MD;  Location: [facility] OR;  Service: Urology;  Laterality: N/A;     FAMILY HISTORY: family history includes Diabetes in his mother and son and Heart disease in his father.   SOCIAL HISTORY:  reports that he quit smoking about 34 years ago. His smoking use included Cigarettes. He smoked 0.00 packs per day. He does not have any smokeless tobacco history on file. He reports that  drinks alcohol. He reports that he does not use illicit  drugs.   ALLERGIES: Codeine; Other; and Zoloft   MEDICATIONS:  Current Outpatient Prescriptions  Medication Sig Dispense Refill  . aspirin EC 81 MG tablet Take 81 mg by mouth daily.      . calcipotriene (DOVONOX) 0.005 % cream Apply 1 application topically daily as needed (for hands).       . Calcium Carbonate (CALCIUM 600 PO) Take 1 tablet by  mouth daily.      . Cholecalciferol (VITAMIN D-3) 1000 UNITS CAPS Take 1,000 Units by mouth daily.       . clobetasol cream (TEMOVATE) 0.05 % Apply 1 application topically daily.       . clonazePAM (KLONOPIN) 0.5 MG tablet Take 0.5 mg by mouth at bedtime.      . colesevelam (WELCHOL) 625 MG tablet Take 3,750 mg by mouth daily with supper.      . cyanocobalamin 1000 MCG tablet Take 1,000 mcg by mouth daily.      Marland Kitchen esomeprazole (NEXIUM) 40 MG capsule Take 40 mg by mouth daily before breakfast.      . finasteride (PROSCAR) 5 MG tablet Take 5 mg by mouth daily before breakfast.       . folic acid (FOLVITE) 1 MG tablet Take 1 mg by mouth daily.      Marland Kitchen gabapentin (NEURONTIN) 300 MG capsule Take 600-900 mg by mouth 2 (two) times daily. Takes 2 capsules every morning, 300mg  x3 at night      . glucosamine-chondroitin 500-400 MG tablet Take 1 tablet by mouth 2 (two) times daily.       Marland Kitchen lithium carbonate (LITHOBID) 300 MG CR tablet Take 300 mg by mouth at bedtime.      . Multiple Vitamins-Minerals (MULTIVITAMIN PO) Take 2 tablets by mouth daily.       . propranolol (INDERAL) 10 MG tablet Take 10-20 mg by mouth daily as needed (Takes rarely, only under extremely stressful circumstances.).       Marland Kitchen testosterone cypionate (DEPOTESTOTERONE CYPIONATE) 100 MG/ML injection Inject 100 mg into the muscle every Friday. For IM use only      . vardenafil (LEVITRA) 5 MG tablet Take 5 mg by mouth daily as needed for erectile dysfunction.        No current facility-administered medications for this encounter.     REVIEW OF SYSTEMS:  A 15 point review of systems is documented in the electronic medical record. This was obtained by the nursing staff. However, I reviewed this with the patient to discuss relevant findings and make appropriate changes.  Pertinent items are noted in HPI.    PHYSICAL EXAM:  vitals were not taken for this visit.  General: Well-developed, in no acute distress HEENT: Normocephalic, atraumatic;  oral cavity clear Neck: Supple without any lymphadenopathy Cardiovascular: Regular rate and rhythm Respiratory: Clear to auscultation bilaterally; well healing surgical incision present within the right chest laterally GI: Soft, nontender, normal bowel sounds Extremities: No edema present Neuro: No focal deficits     LABORATORY DATA:  Lab Results  Component Value Date   WBC 13.6* 02/23/2013   HGB 13.2 02/23/2013   HCT 37.0* 02/23/2013   MCV 89.6 02/23/2013   PLT 171 02/23/2013   Lab Results  Component Value Date   NA 138 02/23/2013   K 3.9 02/23/2013   CL 104 02/23/2013   CO2 28 02/23/2013   Lab Results  Component Value Date   ALT 12 02/23/2013   AST 17 02/23/2013   ALKPHOS 43 02/23/2013   BILITOT  1.2 02/23/2013      RADIOGRAPHY: Dg Chest 2 View  02/26/2013  *RADIOLOGY REPORT*  Clinical Data: 78 year old male status post chest tube removal. Right upper lobectomy.  Right upper lung cancer.  CHEST - 2 VIEW  Comparison: 02/25/2013 and earlier.  Findings: Stable small right pneumothorax visible at the apex and to an extent that on the side of the right hemithorax.  Stable right subclavian central line.  Stable right hilar/mediastinal increased density and contour deformity.  Superimposed surgical clips.  Continued low lung volumes.  Small bilateral pleural effusions.  No confluent opacity in the right lung. Visualized tracheal air column is within normal limits.  Small volume right chest wall subcutaneous gas. Stable visualized osseous structures.  IMPRESSION: 1.  Stable small right lung pneumothorax. 2.  Continued increased density and rounded contour at the right hilum.  Postoperative fluid or hematoma may be present.  Chest CT (IV contrast preferred) would confirm. 3.  Continued low lung volumes.  Small bilateral pleural effusions.   Original Report Authenticated By: Erskine Speed, M.D.    Dg Chest 2 View  02/25/2013  *RADIOLOGY REPORT*  Clinical Data: Evaluate right-sided chest tube  CHEST -  2 VIEW  Comparison: 02/24/2013; 02/20/2013; PET CT - 02/14/2013  Findings:  Grossly unchanged cardiac silhouette and mediastinal contours with obscuration of the right heart border secondary to partial right upper lobe atelectasis.  Surgical clips are seen about the right hilum.  Stable positioning of support apparatus with likely unchanged tiny right apical pneumothorax.  Postsurgical changes of the lateral aspect of the right fifth rib.  Interval decrease in minimal amount of residual right lateral chest wall subcutaneous emphysema.  No new focal airspace opacities.  Unchanged bones.  IMPRESSION: 1.  Stable positioning of support apparatus with tiny right apical pneumothorax. 2.  Postsurgical change of the right lung with volume loss and obscuration of the right hilum.   Original Report Authenticated By: Tacey Ruiz, MD    Dg Chest 2 View  02/20/2013  *RADIOLOGY REPORT*  Clinical Data: Preoperative evaluation for right lung wedge resection, shortness of breath, lung mass  CHEST - 2 VIEW  Comparison: None Correlation: PET CT 02/14/2013  Findings: Normal heart size, mediastinal contours, pulmonary vascularity. Poorly defined 2.2 cm diameter density identified right upper lobe, superimposed with medial right clavicle, corresponding to lung mass identified on prior PET CT. Lungs appear emphysematous with minimal central peribronchial thickening. Bilateral nipple shadows. No pulmonary infiltrate, pleural effusion or pneumothorax. Additional questionable nodular density at the left lung base noted, 7 mm diameter, without identification of a pulmonary nodule at this site on the preceding CT, question artifact.  IMPRESSION: COPD changes with a 2.2 cm diameter poorly defined right apical opacity corresponding to the mass seen on prior PET CT. No acute abnormalities.   Original Report Authenticated By: Ulyses Southward, M.D.    Mr Laqueta Jean Wo Contrast  02/20/2013  *RADIOLOGY REPORT*  Clinical Data: 78 year old male with  lung mass.  Staging.  Headache and dizziness.  MRI HEAD WITHOUT AND WITH CONTRAST  Technique:  Multiplanar, multiecho pulse sequences of the brain and surrounding structures were obtained according to standard protocol without and with intravenous contrast  Contrast: 17mL MULTIHANCE GADOBENATE DIMEGLUMINE 529 MG/ML IV SOLN  Comparison: Neck CT 02/07/2013.  Findings: Small developmental venous anomaly of left anterior frontal lobe. No abnormal enhancement identified.  No midline shift, mass effect, or evidence of mass lesion.  No restricted diffusion to suggest acute infarction.  No  ventriculomegaly. No acute intracranial hemorrhage identified. Major intracranial vascular flow voids the are preserved.  Dominant distal left vertebral artery.   Negative pituitary and cervicomedullary junction.  Negative visualized cervical spine except for degenerative changes. Wallace Cullens and white matter signal is within normal limits throughout the brain.  Normal bone marrow signal.  Postoperative changes to both globes. Visualized paranasal sinuses and mastoids are clear.  Negative scalp soft tissues.  IMPRESSION: Negative. No acute or metastatic intracranial abnormality.   Original Report Authenticated By: Erskine Speed, M.D.    Nm Pet Image Initial (pi) Skull Base To Thigh  02/14/2013  *RADIOLOGY REPORT*  Clinical Data: Initial treatment strategy for right apical lung mass on prior neck CT.  NUCLEAR MEDICINE PET SKULL BASE TO THIGH  Fasting Blood Glucose:  98  Technique:  19.3 mCi F-18 FDG was injected intravenously. CT data was obtained and used for attenuation correction and anatomic localization only.  (This was not acquired as a diagnostic CT examination.) Additional exam technical data entered on technologist worksheet.  Comparison:  Neck CT 02/08/1947.  Abdominal pelvic CT from [facility] dated 01/25/2013.  Findings:  Neck:  No abnormal hypermetabolism.  Chest:  Corresponding to the previously described abnormality, within  the right apex, is a spiculated mass which measures 3.5 x 2.7 cm and a S.U.V. max of 4.9 on image 78/series 2.  Spiculation contacts the posterior right apical pleura and extends to the superior aspect of the right major fissure.  No abnormal nodal activity within the chest.  Abdomen/Pelvis:  No abnormal hypermetabolism.  Skeleton:  No evidence of hypermetabolic osseous lesions.  CT  images performed for attenuation correction demonstrate no cervical adenopathy.  Mild cardiomegaly, without pericardial effusion.  Tiny right and trace left pleural fluid  Normal adrenal glands.  Right nephrolithiasis.  Bilateral fluid density renal lesions which are likely cysts.  Old granulomatous disease in the spleen.  Normal urinary bladder.  Moderate prostatomegaly.  Tiny fat containing left inguinal hernia.  IMPRESSION:  1.  Hypermetabolic right apical lung mass, most consistent with primary bronchogenic carcinoma. No thoracic nodal or extrathoracic metastasis.  Presuming non-small cell histology, T2aN0M0 or stage IB. 2.  Trace right greater than left bilateral pleural effusions. 3.  Incidental findings, including right nephrolithiasis and prostatomegaly.   Original Report Authenticated By: Jeronimo Greaves, M.D.    Dg Chest Port 1 View  02/25/2013  *RADIOLOGY REPORT*  Clinical Data: Status post removal of right-sided chest tube.  PORTABLE CHEST - 1 VIEW  Comparison: Chest x-ray 02/25/2013.  Findings: Previously noted right-sided chest tube has been removed. A small right pneumothorax has increased in size now occupying approximately 15% of the right hemithorax.  Postoperative changes of right upper lobectomy are noted.  Similar postoperative mass- like opacity in the right perihilar region may represent atelectatic lung or postoperative fluid collection (which may be loculated).  This appears slightly larger than the prior examination.  Left lung is clear.  No left pleural effusion.  Heart size is within normal limits.   Mediastinal contours are distorted by patient positioning. There is a right-sided subclavian central venous catheter with tip terminating in the mid superior vena cava. Small amount of subcutaneous emphysema in the right chest wall.  IMPRESSION: 1.  Status post removal of right-sided chest tube with slight enlargement of a right-sided pneumothorax, as above. 2.  Slight increase in size of right perihilar mass like opacity, favored to represent some loculated postoperative fluid. 3.  Additional findings, as above, similar to  prior studies.  These results were called by telephone on 02/25/2013 at 07:00 p.m. to nurse Alphonzo Lemmings, who verbally acknowledged these results.   Original Report Authenticated By: Trudie Reed, M.D.    Dg Chest Port 1 View  02/24/2013  *RADIOLOGY REPORT*  Clinical Data: Chest tube management.  Postop day three, status post right upper lobectomy through VATS revealing adenocarcinoma.  PORTABLE CHEST - 1 VIEW  Comparison: 02/23/2013  Findings: The patient is rotated to the right on today's exam, resulting in reduced diagnostic sensitivity and specificity.  A single right-sided chest tube remains.  Right central line tip projects over the SVC.  Right hilar density noted.  There is mild residual subsegmental atelectasis along the left hemidiaphragm.  Minimal subcutaneous emphysema on the right.  A button projects over the right chest. No pneumothorax.  Degenerative left glenohumeral arthropathy noted.  Thoracic spondylosis noted.  IMPRESSION:  1.  Stable prominence the right hilum, of uncertain significance, query localized hematoma or atelectasis.  This may be exaggerated by the degree of rightward rotation but appears similar to prior. 2.  One of the right-sided chest tubes has been removed.  No pneumothorax. 3.  Low lung volumes with subsegmental atelectasis along the left hemidiaphragm.   Original Report Authenticated By: Gaylyn Rong, M.D.    Dg Chest Port 1 View  02/23/2013   *RADIOLOGY REPORT*  Clinical Data: Postop, chest tube  PORTABLE CHEST - 1 VIEW  Comparison: 02/22/2013  Findings: Postsurgical changes in the right hemithorax with associated volume loss and right perihilar fullness.  Two right apical chest tubes.  No pneumothorax is seen.  Mild subcutaneous emphysema along the right chest wall.  Low lung volumes.  Mild cardiomegaly.  Stable right subclavian venous catheter.  IMPRESSION: Postsurgical changes in the right hemithorax.  Two right apical chest tubes.  No pneumothorax is seen.   Original Report Authenticated By: Charline Bills, M.D.    Dg Chest Port 1 View  02/22/2013  *RADIOLOGY REPORT*  Clinical Data: Postoperative thoracotomy with chest tubes in place  Left lung is clear.  PORTABLE CHEST - 1 VIEW  Comparison:  February 21, 2013  Findings: Two chest tubes remain in place in the right.  There is a tiny apical pneumothorax.  There is a central catheter on the right with the tip either in or overlying the superior vena cava.  There remains a soft tissue prominence in the right hilum with what is felt to be partial collapse of the right upper lobe.  Adenopathy in the right hilar region cannot be excluded.  Lungs are otherwise clear.  Heart is upper normal in size with normal pulmonary vascularity. There is stable rib fractures on the right.  IMPRESSION: Minimal pneumothorax on the right with right-sided chest tubes unchanged in position.  Soft tissue fullness in the right perihilar region remains, felt in large part to be due to focal lung collapse in this area. Left lung is clear.   Original Report Authenticated By: Bretta Bang, M.D.    Dg Chest Portable 1 View  02/21/2013  *RADIOLOGY REPORT*  Clinical Data: Status post VATS  PORTABLE CHEST - 1 VIEW  Comparison: Two-view chest 02/20/2013.  Findings: A right subclavian catheter terminates in the SVC.  To right-sided chest tubes are in place.  There is no pneumothorax. Patient is status post right upper lobectomy.   A right rib osteotomies noted.  Mild bibasilar atelectasis is worse on the left.  IMPRESSION:  1.  Postoperative changes of VATS and right upper  lobectomy. 2.  Low lung volumes and bibasilar atelectasis. 3.  Two right-sided chest tubes are in place without evidence for significant pneumothorax.   Original Report Authenticated By: Marin Roberts, M.D.        IMPRESSION: The patient is status post resection of what initially was felt to be a stage I non-small cell lung cancer. The resection yielded negative margins corresponding to the T2a tumor, but 1 N2 node was positive therefore upstaging him to stage IIIa disease.  The patient I believe is a good candidate for adjuvant radiotherapy based on the mediastinal involvement. He also is an appropriate candidate for chemotherapy and he is seeing Dr. Arbutus Ped today in multidisciplinary thoracic clinic.  I discussed therefore an approximate 5 week course of radiotherapy which would follow chemotherapy initially. I do not believe that the patient requires concurrent chemoradiation. We discussed the rationale of this treatment in addition to the potential side effects and risks. All of his questions were answered.   PLAN: I will see the patient back towards the end of his course of chemotherapy to review his status at that time and to coordinate an anticipated course of adjuvant radiotherapy.     I spent 60 minutes minutes face to face with the patient and more than 50% of that time was spent in counseling and/or coordination of care.    ________________________________   Radene Gunning, MD, PhD

## 2013-03-14 ENCOUNTER — Encounter: Payer: Self-pay | Admitting: *Deleted

## 2013-03-14 ENCOUNTER — Other Ambulatory Visit: Payer: Medicare Other

## 2013-03-14 ENCOUNTER — Other Ambulatory Visit: Payer: Self-pay | Admitting: Medical Oncology

## 2013-03-14 ENCOUNTER — Other Ambulatory Visit: Payer: Self-pay | Admitting: *Deleted

## 2013-03-14 MED ORDER — DEXAMETHASONE 4 MG PO TABS: 4.0000 mg | ORAL_TABLET | Freq: Two times a day (BID) | ORAL | Status: DC

## 2013-03-14 MED ORDER — FOLIC ACID 1 MG PO TABS: 1.0000 mg | ORAL_TABLET | Freq: Every day | ORAL | Status: DC

## 2013-03-14 MED ORDER — PROCHLORPERAZINE MALEATE 10 MG PO TABS: 10.0000 mg | ORAL_TABLET | Freq: Four times a day (QID) | ORAL | Status: DC | PRN

## 2013-03-14 NOTE — Telephone Encounter (Signed)
called in dex, folic acid and compazine.

## 2013-03-15 ENCOUNTER — Ambulatory Visit
Admission: RE | Admit: 2013-03-15 | Discharge: 2013-03-15 | Disposition: A | Discharge status: Home / Self Care | Payer: Federal, State, Local not specified - PPO | Source: Ambulatory Visit | Attending: Cardiothoracic Surgery | Admitting: Cardiothoracic Surgery

## 2013-03-15 ENCOUNTER — Ambulatory Visit (INDEPENDENT_AMBULATORY_CARE_PROVIDER_SITE_OTHER): Payer: Self-pay | Admitting: Cardiothoracic Surgery

## 2013-03-15 ENCOUNTER — Encounter: Payer: Self-pay | Admitting: Cardiothoracic Surgery

## 2013-03-15 ENCOUNTER — Encounter: Payer: Self-pay | Admitting: Internal Medicine

## 2013-03-15 DIAGNOSIS — Z09 Encounter for follow-up examination after completed treatment for conditions other than malignant neoplasm: Secondary | ICD-10-CM

## 2013-03-15 DIAGNOSIS — C3410 Malignant neoplasm of upper lobe, unspecified bronchus or lung: Secondary | ICD-10-CM

## 2013-03-15 NOTE — Patient Instructions (Signed)
May drive No lifting over 25 lbs for 2 months

## 2013-03-15 NOTE — Progress Notes (Signed)
[address]            [place] [address]          [phone]       JOHANNA STAFFORD [facility] Medical Record #[id number] Date of Birth: [birth year]  [name], MD [name], MD  Chief Complaint:   PostOp Follow Up Visit  02/21/2013   PREOPERATIVE DIAGNOSIS: Right upper lobe lung mass suspicious for  malignancy.  POSTOPERATIVE DIAGNOSIS: Right upper lobe lung mass suspicious for  malignancy. 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  PROCEDURE PERFORMED: Bronchoscopy, right upper lobe. Bronchoscopy  right video-assisted thoracoscopy, mini thoracotomy and right upper lobe  resection with lymph node dissection and placement of On-Q device.  SURGEON: Sheliah Plane, MD  PATH: stage IIIA,  EGFR: Exon 19 deletion  Mutation detected Diagnosis 1. Lung, resection (segmental or lobe), Right upper lobe - ADENOCARCINOMA, 3.4 CM. - MARGINS NOT INVOLVED. - VISCERAL PLEURA FREE OF TUMOR. - ONE BENIGN LYMPH NODE. 2. Lymph node, biopsy, Right upper 4 R - METASTATIC ADENOCARCINOMA. 3. Lymph node, biopsy, Right 2 R - ANTHRACOTIC LYMPH NODE. NO TUMOR IDENTIFIED. Microscopic Comment 1. LUNG Specimen, including laterality: Right lung lobe. Procedure: Lobectomy. Specimen integrity (intact/disrupted): Intact. Tumor site: Right upper lobe. Tumor focality: Unifocal Maximum tumor size (cm): 3.4 cm. Histologic type: Adenocarcinoma, bronchioalveolar cell type. Grade: I. Margins: Free of tumor. Visceral pleura invasion: No. Tumor extension: Within lobe. Treatment effect (if treated with neoadjuvant therapy): No. Lymph -Vascular invasion: Present. Lymph nodes: Number examined - 3 ; Number N1 nodes positive - 0 ; Number N2 nodes positive - 1 TNM code: pT2a , pN2, pMX Ancillary studies: EGFR and ALK pending. Non-neoplastic lung: Unremarkable . History of Present Illness:     Patient is making good progress  postoperatively. He has had some chest wall discomfort, but is otherwise returning to near normal activities without difficulty. He has been to the [facility] clinic and discussed further treatment with chemotherapy and radiation. He notes that he completed his course of antibiotics started for enterococcal urinary tract infection. Recent followup at primary care office showed urine tract is clear.    History  Smoking status  . Former Smoker  . Types: Cigarettes  . Quit date: 11/01/1978  Smokeless tobacco  . Not on file       Allergies  Allergen Reactions  . Codeine Anaphylaxis  . Other     Pt. Remarks that all pain med. Make him nauseated   . Zoloft (Sertraline Hcl) Other (See Comments)    REACTION:  unknown    Current Outpatient Prescriptions  Medication Sig Dispense Refill  . aspirin EC 81 MG tablet Take 81 mg by mouth daily.      . calcipotriene (DOVONOX) 0.005 % cream Apply 1 application topically daily as needed (for hands).       . Calcium Carbonate (CALCIUM 600 PO) Take 1 tablet by mouth daily.      . Cholecalciferol (VITAMIN D-3) 1000 UNITS CAPS Take 1,000 Units by mouth daily.       . clobetasol cream (TEMOVATE) 0.05 % Apply 1 application topically daily.       . clonazePAM (KLONOPIN) 0.5 MG tablet Take 0.5 mg by mouth at bedtime.      . colesevelam (WELCHOL) 625 MG tablet Take 3,750 mg by mouth daily with supper.      Marland Kitchen  cyanocobalamin 1000 MCG tablet Take 1,000 mcg by mouth daily.      Marland Kitchen dexamethasone (DECADRON) 4 MG tablet Take 1 tablet (4 mg total) by mouth 2 (two) times daily with a meal.  30 tablet  0  . esomeprazole (NEXIUM) 40 MG capsule Take 40 mg by mouth daily before breakfast.      . finasteride (PROSCAR) 5 MG tablet Take 5 mg by mouth daily before breakfast.       . folic acid (FOLVITE) 1 MG tablet Take 1 tablet (1 mg total) by mouth daily.  30 tablet  1  . gabapentin (NEURONTIN) 300 MG capsule Take 600-900 mg by mouth 2 (two) times daily. Takes 2 capsules every  morning, 300mg  x3 at night      . glucosamine-chondroitin 500-400 MG tablet Take 1 tablet by mouth 2 (two) times daily.       Marland Kitchen lithium carbonate (LITHOBID) 300 MG CR tablet Take 300 mg by mouth at bedtime.      . Multiple Vitamins-Minerals (MULTIVITAMIN PO) Take 2 tablets by mouth daily.       . prochlorperazine (COMPAZINE) 10 MG tablet Take 1 tablet (10 mg total) by mouth every 6 (six) hours as needed.  30 tablet  0  . propranolol (INDERAL) 10 MG tablet Take 10-20 mg by mouth daily as needed (Takes rarely, only under extremely stressful circumstances.).       Marland Kitchen testosterone cypionate (DEPOTESTOTERONE CYPIONATE) 100 MG/ML injection Inject 100 mg into the muscle every Friday. For IM use only      . vardenafil (LEVITRA) 5 MG tablet Take 5 mg by mouth daily as needed for erectile dysfunction.        No current facility-administered medications for this visit.       Physical Exam: BP 117/74  Pulse 92  Resp 16  Ht 5' 10.5" (1.791 m)  Wt 167 lb 8 oz (75.978 kg)  BMI 23.69 kg/m2  SpO2 98%  General appearance: alert, cooperative and no distress Neurologic: intact Heart: regular rate and rhythm, S1, S2 normal, no murmur, click, rub or gallop and normal apical impulse Lungs: clear to auscultation bilaterally and normal percussion bilaterally Abdomen: soft, non-tender; bowel sounds normal; no masses,  no organomegaly Extremities: extremities normal, atraumatic, no cyanosis or edema and Homans sign is negative, no sign of DVT Wound: The right chest wall incisions are all well-healed   Diagnostic Studies & Laboratory data:         Recent Radiology Findings: Dg Chest 2 View  03/15/2013   *RADIOLOGY REPORT*  Clinical Data: Lung cancer  CHEST - 2 VIEW  Comparison: 02/26/2013  Findings: Postop surgical clips overlying the right hilum. Right apical mass has been removed.  Improvement in right hilar density compared with the prior study.  PET CT from 02/14/2013 did not revealed tumor in this area  and this density may have been mediastinal fluid related to central venous catheter on the prior study.  Right-sided pneumothorax has resolved.  Right-sided central venous catheter has been removed.  1 cm nodule left lung base most likely a nipple shadow.  Negative for heart failure or pneumonia.  IMPRESSION: Postop changes in the right with resolving density around the right hilum.  Probable nipple shadow left lung base.  Resolution of right-sided pneumothorax.   Original Report Authenticated By: Janeece Riggers, M.D.      Recent Labs: Lab Results  Component Value Date   WBC 13.6* 02/23/2013   HGB 13.2 02/23/2013   HCT  37.0* 02/23/2013   PLT 171 02/23/2013   GLUCOSE 120* 02/23/2013   ALT 12 02/23/2013   AST 17 02/23/2013   NA 138 02/23/2013   K 3.9 02/23/2013   CL 104 02/23/2013   CREATININE 0.89 02/23/2013   BUN 9 02/23/2013   CO2 28 02/23/2013   INR 0.99 02/20/2013      Assessment / Plan:     Stable following resection of right upper lobe, lung cancer which was found to be EGFR positive adenocarcinoma stage IIIa. Patient is doing well postoperatively and will start chemotherapy and radiation within 2 weeks.       Makena Murdock B 03/15/2013 3:16 PM

## 2013-03-18 ENCOUNTER — Encounter: Payer: Self-pay | Admitting: Internal Medicine

## 2013-03-19 ENCOUNTER — Telehealth: Payer: Self-pay | Admitting: *Deleted

## 2013-03-19 NOTE — Telephone Encounter (Signed)
Spoke with pt regarding medication for chemotherapy.  He verbalized understanding

## 2013-03-21 ENCOUNTER — Encounter: Payer: Self-pay | Admitting: Specialist

## 2013-03-21 NOTE — Progress Notes (Signed)
Conversation by phone with Mr. Alvin Hardy to offer support as needed during his treatment.  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

## 2013-03-23 ENCOUNTER — Telehealth: Payer: Self-pay | Admitting: Internal Medicine

## 2013-03-23 ENCOUNTER — Telehealth: Payer: Self-pay | Admitting: Medical Oncology

## 2013-03-23 NOTE — Telephone Encounter (Signed)
Talked to pt and gave him appt for 5/27 lab, MD and chemo, pt wanted to talk to MD, transferred call to nurse and notified nurse of call °

## 2013-03-23 NOTE — Telephone Encounter (Signed)
I called pt with pharmacy follow up that there does not appear to be any problem with him taking the premeds and compazine with his other meds.

## 2013-03-23 NOTE — Telephone Encounter (Signed)
Concerns about drug interaction with any of the premeds and compazine .  Pharmacy investigating.

## 2013-03-23 NOTE — Telephone Encounter (Signed)
Talked to pt and gave him appt for 5/27 lab, MD and chemo, pt wanted to talk to MD, transferred call to nurse and notified nurse of call

## 2013-03-26 ENCOUNTER — Other Ambulatory Visit: Payer: Self-pay | Admitting: Oncology

## 2013-03-27 ENCOUNTER — Other Ambulatory Visit: Payer: Self-pay | Admitting: *Deleted

## 2013-03-27 ENCOUNTER — Other Ambulatory Visit (HOSPITAL_BASED_OUTPATIENT_CLINIC_OR_DEPARTMENT_OTHER): Payer: Federal, State, Local not specified - PPO | Admitting: Lab

## 2013-03-27 ENCOUNTER — Telehealth: Payer: Self-pay | Admitting: Internal Medicine

## 2013-03-27 ENCOUNTER — Encounter: Payer: Self-pay | Admitting: Internal Medicine

## 2013-03-27 ENCOUNTER — Ambulatory Visit (HOSPITAL_BASED_OUTPATIENT_CLINIC_OR_DEPARTMENT_OTHER): Payer: Medicare Other | Admitting: Internal Medicine

## 2013-03-27 ENCOUNTER — Telehealth: Payer: Self-pay | Admitting: *Deleted

## 2013-03-27 ENCOUNTER — Ambulatory Visit (HOSPITAL_BASED_OUTPATIENT_CLINIC_OR_DEPARTMENT_OTHER): Payer: Medicare Other

## 2013-03-27 DIAGNOSIS — C3410 Malignant neoplasm of upper lobe, unspecified bronchus or lung: Secondary | ICD-10-CM

## 2013-03-27 DIAGNOSIS — Z5111 Encounter for antineoplastic chemotherapy: Secondary | ICD-10-CM

## 2013-03-27 LAB — CBC WITH DIFFERENTIAL/PLATELET
Basophils Absolute: 0.1 10*3/uL (ref 0.0–0.1)
Eosinophils Absolute: 0.4 10*3/uL (ref 0.0–0.5)
HGB: 15.8 g/dL (ref 13.0–17.1)
LYMPH%: 31.3 % (ref 14.0–49.0)
MCV: 89.5 fL (ref 79.3–98.0)
MONO#: 0.7 10*3/uL (ref 0.1–0.9)
MONO%: 7.1 % (ref 0.0–14.0)
NEUT#: 5.2 10*3/uL (ref 1.5–6.5)
Platelets: 191 10*3/uL (ref 140–400)
RDW: 12.9 % (ref 11.0–14.6)
WBC: 9.4 10*3/uL (ref 4.0–10.3)

## 2013-03-27 LAB — COMPREHENSIVE METABOLIC PANEL (CC13)
Albumin: 3.8 g/dL (ref 3.5–5.0)
Alkaline Phosphatase: 63 U/L (ref 40–150)
BUN: 17.4 mg/dL (ref 7.0–26.0)
CO2: 23 mEq/L (ref 22–29)
Glucose: 122 mg/dl — ABNORMAL HIGH (ref 70–99)
Potassium: 4.2 mEq/L (ref 3.5–5.1)
Total Protein: 6.6 g/dL (ref 6.4–8.3)

## 2013-03-27 MED ORDER — SODIUM CHLORIDE 0.9 % IV SOLN
490.0000 mg | Freq: Once | INTRAVENOUS | Status: AC
  Administered 2013-03-27: 490 mg via INTRAVENOUS
  Filled 2013-03-27: qty 49

## 2013-03-27 MED ORDER — SODIUM CHLORIDE 0.9 % IV SOLN
Freq: Once | INTRAVENOUS | Status: AC
  Administered 2013-03-27: 15:00:00 via INTRAVENOUS

## 2013-03-27 MED ORDER — SODIUM CHLORIDE 0.9 % IV SOLN
500.0000 mg/m2 | Freq: Once | INTRAVENOUS | Status: AC
  Administered 2013-03-27: 1000 mg via INTRAVENOUS
  Filled 2013-03-27: qty 40

## 2013-03-27 MED ORDER — ONDANSETRON 16 MG/50ML IVPB (CHCC)
16.0000 mg | Freq: Once | INTRAVENOUS | Status: AC
  Administered 2013-03-27: 16 mg via INTRAVENOUS

## 2013-03-27 NOTE — Progress Notes (Signed)
[facility] Telephone:([phone]) [phone]   Fax:([phone]) [phone]  OFFICE PROGRESS NOTE  [name], MD [address] [place] [address]  DIAGNOSIS: Stage III A (T2 A., N2, M0) non-small cell lung cancer, adenocarcinoma with positive EGFR mutation in exon 19 and negative ALK gene translocation diagnosed in April 2014  PRIOR THERAPY: Status post right upper lobectomy with lymph node dissection under the care of Dr. Tyrone Sage on 02/21/2013  CURRENT THERAPY: Systemic adjuvant chemotherapy with carboplatin for AUC of 5 and Alimta 500 mg/M2 every 3 weeks. First dose today 03/27/2013.  INTERVAL HISTORY: Alvin Hardy 78 y.o. male returns to the clinic today for followup visit. The patient is feeling fine today with no specific complaints. He denied having any significant fatigue and weakness. He denied having any chest pain, shortness breath, cough or hemoptysis. He has no fever or chills. Has some concern about treatment with dexamethasone with his chemotherapy and his concern is related to agitation and aggravation of his bipolar disorder. He would like to take his treatment with dexamethasone. He is here today to start the first cycle of adjuvant chemotherapy.   MEDICAL HISTORY: Past Medical History  Diagnosis Date  . BPH (benign prostatic hyperplasia)   . Hypogonadism male   . Bipolar 1 disorder   . ED (erectile dysfunction)   . Hyperlipidemia   . Neuromuscular disorder     peripheral neuropathy  . Cataract 2010    Bilateral  . Laryngopharyngeal reflux   . Gilbert's syndrome   . Deviated septum     TO THE LEFT  . Morton's neuroma     LEFT FOOT  . Lung mass 02/07/13    RIGHT UPPER LOBE  . Shortness of breath     voice breaks, ? related to reflux, although told by Dr. Christella Hartigan, S.- early 32 yr. old  . GERD (gastroesophageal reflux disease)   . Cancer     lung  . Arthritis     psoriatic arthritis, ? , treated /w mmethotrexate   . Finger injury     mallet  finger-- 02/13/2013, cast in place, followed by dr. Merlyn Lot    ALLERGIES:  is allergic to codeine; other; and zoloft.  MEDICATIONS:  Current Outpatient Prescriptions  Medication Sig Dispense Refill  . aspirin EC 81 MG tablet Take 81 mg by mouth daily.      . calcipotriene (DOVONOX) 0.005 % cream Apply 1 application topically daily as needed (for hands).       . Calcium Carbonate (CALCIUM 600 PO) Take 1 tablet by mouth daily.      . Cholecalciferol (VITAMIN D-3) 1000 UNITS CAPS Take 1,000 Units by mouth daily.       . clobetasol cream (TEMOVATE) 0.05 % Apply 1 application topically daily.       . clonazePAM (KLONOPIN) 0.5 MG tablet Take 0.5 mg by mouth at bedtime.      . colesevelam (WELCHOL) 625 MG tablet Take 3,750 mg by mouth daily with supper.      . cyanocobalamin 1000 MCG tablet Take 1,000 mcg by mouth daily.      Marland Kitchen dexamethasone (DECADRON) 4 MG tablet Take 1 tablet (4 mg total) by mouth 2 (two) times daily with a meal.  30 tablet  0  . esomeprazole (NEXIUM) 40 MG capsule Take 40 mg by mouth daily before breakfast.      . finasteride (PROSCAR) 5 MG tablet Take 5 mg by mouth daily before breakfast.       .  folic acid (FOLVITE) 1 MG tablet Take 1 tablet (1 mg total) by mouth daily.  30 tablet  1  . gabapentin (NEURONTIN) 300 MG capsule Take 600-900 mg by mouth 2 (two) times daily. Takes 2 capsules every morning, 300mg  x3 at night      . glucosamine-chondroitin 500-400 MG tablet Take 1 tablet by mouth 2 (two) times daily.       Marland Kitchen lithium carbonate (LITHOBID) 300 MG CR tablet Take 300 mg by mouth at bedtime.      . Multiple Vitamins-Minerals (MULTIVITAMIN PO) Take 2 tablets by mouth daily.       . prochlorperazine (COMPAZINE) 10 MG tablet Take 1 tablet (10 mg total) by mouth every 6 (six) hours as needed.  30 tablet  0  . propranolol (INDERAL) 10 MG tablet Take 10-20 mg by mouth daily as needed (Takes rarely, only under extremely stressful circumstances.).       Marland Kitchen testosterone cypionate  (DEPOTESTOTERONE CYPIONATE) 100 MG/ML injection Inject 100 mg into the muscle every Friday. For IM use only      . vardenafil (LEVITRA) 5 MG tablet Take 5 mg by mouth daily as needed for erectile dysfunction.        No current facility-administered medications for this visit.    SURGICAL HISTORY:  Past Surgical History  Procedure Laterality Date  . Prostate surgery  1996    TURP  . Knee arthroscopy Right 01/2007  . Pilonidal cyst excision  1960's  . Eye surgery      cataracts removed fr. both eyes, IOL in place   . Video assisted thoracoscopy (vats)/wedge resection Right 02/21/2013    Procedure: Right VIDEO ASSISTED THORACOSCOPY ,Thoracotomy with right upper lobectomy, node sampling ;  Surgeon: Delight Ovens, MD;  Location: [facility] OR;  Service: Thoracic;  Laterality: Right;  . Video bronchoscopy N/A 02/21/2013    Procedure: VIDEO BRONCHOSCOPY;  Surgeon: Delight Ovens, MD;  Location: [facility] OR;  Service: Thoracic;  Laterality: N/A;  . Cystoscopy N/A 02/21/2013    Procedure: CYSTOSCOPY FLEXIBLE with insertion of foley catheter;  Surgeon: Antony Haste, MD;  Location: [facility] OR;  Service: Urology;  Laterality: N/A;    REVIEW OF SYSTEMS:  A comprehensive review of systems was negative.   PHYSICAL EXAMINATION: General appearance: alert, cooperative and no distress Head: Normocephalic, without obvious abnormality, atraumatic Neck: no adenopathy Lymph nodes: Cervical, supraclavicular, and axillary nodes normal. Resp: clear to auscultation bilaterally Cardio: regular rate and rhythm, S1, S2 normal, no murmur, click, rub or gallop GI: soft, non-tender; bowel sounds normal; no masses,  no organomegaly Extremities: extremities normal, atraumatic, no cyanosis or edema Neurologic: Alert and oriented X 3, normal strength and tone. Normal symmetric reflexes. Normal coordination and gait  ECOG PERFORMANCE STATUS: 0 - Asymptomatic  Blood pressure 150/94, pulse 107, temperature 97.5 F (36.4  C), temperature source Oral, resp. rate 18, height 5\' 10"  (1.778 m), weight 182 lb 9.6 oz (82.827 kg).  LABORATORY DATA: Lab Results  Component Value Date   WBC 9.4 03/27/2013   HGB 15.8 03/27/2013   HCT 45.2 03/27/2013   MCV 89.5 03/27/2013   PLT 191 03/27/2013      Chemistry      Component Value Date/Time   NA 138 02/23/2013 0419   K 3.9 02/23/2013 0419   CL 104 02/23/2013 0419   CO2 28 02/23/2013 0419   BUN 9 02/23/2013 0419   CREATININE 0.89 02/23/2013 0419      Component Value Date/Time  CALCIUM 8.8 02/23/2013 0419   ALKPHOS 43 02/23/2013 0419   AST 17 02/23/2013 0419   ALT 12 02/23/2013 0419   BILITOT 1.2 02/23/2013 0419       RADIOGRAPHIC STUDIES: Dg Chest 2 View  03/15/2013   *RADIOLOGY REPORT*  Clinical Data: Lung cancer  CHEST - 2 VIEW  Comparison: 02/26/2013  Findings: Postop surgical clips overlying the right hilum. Right apical mass has been removed.  Improvement in right hilar density compared with the prior study.  PET CT from 02/14/2013 did not revealed tumor in this area and this density may have been mediastinal fluid related to central venous catheter on the prior study.  Right-sided pneumothorax has resolved.  Right-sided central venous catheter has been removed.  1 cm nodule left lung base most likely a nipple shadow.  Negative for heart failure or pneumonia.  IMPRESSION: Postop changes in the right with resolving density around the right hilum.  Probable nipple shadow left lung base.  Resolution of right-sided pneumothorax.   Original Report Authenticated By: Janeece Riggers, M.D.   Dg Chest 2 View  02/26/2013   *RADIOLOGY REPORT*  Clinical Data: 78 year old male status post chest tube removal. Right upper lobectomy.  Right upper lung cancer.  CHEST - 2 VIEW  Comparison: 02/25/2013 and earlier.  Findings: Stable small right pneumothorax visible at the apex and to an extent that on the side of the right hemithorax.  Stable right subclavian central line.  Stable right  hilar/mediastinal increased density and contour deformity.  Superimposed surgical clips.  Continued low lung volumes.  Small bilateral pleural effusions.  No confluent opacity in the right lung. Visualized tracheal air column is within normal limits.  Small volume right chest wall subcutaneous gas. Stable visualized osseous structures.  IMPRESSION: 1.  Stable small right lung pneumothorax. 2.  Continued increased density and rounded contour at the right hilum.  Postoperative fluid or hematoma may be present.  Chest CT (IV contrast preferred) would confirm. 3.  Continued low lung volumes.  Small bilateral pleural effusions.   Original Report Authenticated By: Erskine Speed, M.D.   Dg Chest Port 1 View  02/25/2013   *RADIOLOGY REPORT*  Clinical Data: Status post removal of right-sided chest tube.  PORTABLE CHEST - 1 VIEW  Comparison: Chest x-ray 02/25/2013.  Findings: Previously noted right-sided chest tube has been removed. A small right pneumothorax has increased in size now occupying approximately 15% of the right hemithorax.  Postoperative changes of right upper lobectomy are noted.  Similar postoperative mass- like opacity in the right perihilar region may represent atelectatic lung or postoperative fluid collection (which may be loculated).  This appears slightly larger than the prior examination.  Left lung is clear.  No left pleural effusion.  Heart size is within normal limits.  Mediastinal contours are distorted by patient positioning. There is a right-sided subclavian central venous catheter with tip terminating in the mid superior vena cava. Small amount of subcutaneous emphysema in the right chest wall.  IMPRESSION: 1.  Status post removal of right-sided chest tube with slight enlargement of a right-sided pneumothorax, as above. 2.  Slight increase in size of right perihilar mass like opacity, favored to represent some loculated postoperative fluid. 3.  Additional findings, as above, similar to prior  studies.  These results were called by telephone on 02/25/2013 at 07:00 p.m. to nurse Alphonzo Lemmings, who verbally acknowledged these results.   Original Report Authenticated By: Trudie Reed, M.D.    ASSESSMENT: This is a very pleasant  78 years old white male with history of stage IIIA non-small cell lung cancer, adenocarcinoma.  He is status post right upper lobectomy with lymph node dissection.    PLAN: I have a lengthy discussion with the patient today about his condition. I recommended for him to proceed with his systemic chemotherapy with carboplatin and Alimta as scheduled. I will discontinue dexamethasone as anti-emetic from his chemotherapy regimen. I also advised the patient not to take the premedication dexamethasone as previously described.  The patient understands there'll be high probability of nausea with his current chemotherapy with dexamethasone but he is not willing to take any chances with aggravation of his bipolar disorder which has been stable over the last several years on maintenance treatment. He would come back for followup visit in one week for reevaluation and management any adverse effect of his chemotherapy. He was advised to call immediately she has any concerning symptoms in the interval.  All questions were answered. The patient knows to call the clinic with any problems, questions or concerns. We can certainly see the patient much sooner if necessary.

## 2013-03-27 NOTE — Telephone Encounter (Signed)
gv and printed appt sched and avs for pt  °

## 2013-03-27 NOTE — Patient Instructions (Addendum)
Chemotherapy today as scheduled. Followup visit in 1 week.

## 2013-03-27 NOTE — Patient Instructions (Addendum)
[facility] Discharge Instructions for Patients Receiving Chemotherapy  Today you received the following chemotherapy agents alimta and carboplatin.  To help prevent nausea and vomiting after your treatment, we encourage you to take your nausea medication compazine. Begin taking it tonight  and take it as often as prescribed.   If you develop nausea and vomiting that is not controlled by your nausea medication, call the clinic. If it is after clinic hours your family physician or the after hours number for the clinic or go to the Emergency Department.   BELOW ARE SYMPTOMS THAT SHOULD BE REPORTED IMMEDIATELY:  *FEVER GREATER THAN 100.5 F  *CHILLS WITH OR WITHOUT FEVER  NAUSEA AND VOMITING THAT IS NOT CONTROLLED WITH YOUR NAUSEA MEDICATION  *UNUSUAL SHORTNESS OF BREATH  *UNUSUAL BRUISING OR BLEEDING  TENDERNESS IN MOUTH AND THROAT WITH OR WITHOUT PRESENCE OF ULCERS  *URINARY PROBLEMS  *BOWEL PROBLEMS  UNUSUAL RASH Items with * indicate a potential emergency and should be followed up as soon as possible.  One of the nurses will contact you 24 hours after your treatment. Please let the nurse know about any problems that you may have experienced. Feel free to call the clinic you have any questions or concerns. The clinic phone number is [phone].   I have been informed and understand all the instructions given to me. I know to contact the clinic, my physician, or go to the Emergency Department if any problems should occur. I do not have any questions at this time, but understand that I may call the clinic during office hours   should I have any questions or need assistance in obtaining follow up care.    __________________________________________  _____________  __________ Signature of Patient or Authorized Representative            Date                   Time    __________________________________________ Nurse's Signature

## 2013-03-27 NOTE — Telephone Encounter (Signed)
Spoke with pt regarding medication.  Dr. Arbutus Ped aware

## 2013-03-28 ENCOUNTER — Telehealth: Payer: Self-pay | Admitting: *Deleted

## 2013-03-28 NOTE — Telephone Encounter (Signed)
Mr. Blunck says he is doing great.  Denies any side effects.  Followed treatment nurse instructions and has taken anti-emetic last night and this morning.  Says he will not repeat it again unless he experiences any symptoms.  Is happy the treatment cocktail was changed and would like for the treatment to remain the same.  Says he slept a lot better last night.  Eating and drinking well.  Denies any questions.

## 2013-03-28 NOTE — Telephone Encounter (Signed)
Message copied by Augusto Garbe on Wed Mar 28, 2013 11:09 AM ------      Message from: Orbie Hurst      Created: Tue Mar 27, 2013  3:36 PM      Regarding: chemo follow up call      Contact: [phone]       Dr. Kerry Fort  1st carbo/alimta ------

## 2013-04-03 ENCOUNTER — Encounter: Payer: Self-pay | Admitting: Physician Assistant

## 2013-04-03 ENCOUNTER — Other Ambulatory Visit (HOSPITAL_BASED_OUTPATIENT_CLINIC_OR_DEPARTMENT_OTHER): Payer: Federal, State, Local not specified - PPO

## 2013-04-03 ENCOUNTER — Other Ambulatory Visit: Payer: Medicare Other

## 2013-04-03 ENCOUNTER — Ambulatory Visit (HOSPITAL_BASED_OUTPATIENT_CLINIC_OR_DEPARTMENT_OTHER): Payer: Federal, State, Local not specified - PPO | Admitting: Physician Assistant

## 2013-04-03 ENCOUNTER — Ambulatory Visit: Payer: Federal, State, Local not specified - PPO | Admitting: Oncology

## 2013-04-03 ENCOUNTER — Telehealth: Payer: Self-pay | Admitting: Internal Medicine

## 2013-04-03 DIAGNOSIS — C3410 Malignant neoplasm of upper lobe, unspecified bronchus or lung: Secondary | ICD-10-CM

## 2013-04-03 DIAGNOSIS — F319 Bipolar disorder, unspecified: Secondary | ICD-10-CM

## 2013-04-03 LAB — CBC WITH DIFFERENTIAL/PLATELET
Basophils Absolute: 0.1 10*3/uL (ref 0.0–0.1)
Eosinophils Absolute: 0.2 10*3/uL (ref 0.0–0.5)
HGB: 14.2 g/dL (ref 13.0–17.1)
MCV: 89.4 fL (ref 79.3–98.0)
MONO#: 0.4 10*3/uL (ref 0.1–0.9)
NEUT#: 3.2 10*3/uL (ref 1.5–6.5)
RBC: 4.61 10*6/uL (ref 4.20–5.82)
RDW: 13.1 % (ref 11.0–14.6)
WBC: 6.5 10*3/uL (ref 4.0–10.3)
lymph#: 2.5 10*3/uL (ref 0.9–3.3)

## 2013-04-03 LAB — COMPREHENSIVE METABOLIC PANEL (CC13)
Albumin: 3.7 g/dL (ref 3.5–5.0)
Alkaline Phosphatase: 64 U/L (ref 40–150)
BUN: 14 mg/dL (ref 7.0–26.0)
CO2: 23 mEq/L (ref 22–29)
Calcium: 8.9 mg/dL (ref 8.4–10.4)
Chloride: 106 mEq/L (ref 98–107)
Glucose: 149 mg/dl — ABNORMAL HIGH (ref 70–99)
Potassium: 4.1 mEq/L (ref 3.5–5.1)
Sodium: 139 mEq/L (ref 136–145)
Total Protein: 6.4 g/dL (ref 6.4–8.3)

## 2013-04-03 NOTE — Progress Notes (Signed)
[facility] [phone]   [phone]  OFFICE PROGRESS NOTE  Alvin Kayser, MD [address] [place] [address]  DIAGNOSIS: Stage III A (T2 A., N2, M0) non-small cell lung cancer, adenocarcinoma with positive EGFR mutation in exon 19 and negative ALK gene translocation diagnosed in April 2014  PRIOR THERAPY: Status post right upper lobectomy with lymph node dissection under the care of Dr. Tyrone Sage on 02/21/2013  CURRENT THERAPY: Systemic adjuvant chemotherapy with carboplatin for AUC of 5 and Alimta 500 mg/M2 every 3 weeks. Status post 1 cycle  INTERVAL HISTORY: Alvin Hardy 78 y.o. male returns to the clinic today for a symptom management visit. The patient is feeling fine today with no specific complaints. He tolerated his first cycle of adjuvant chemotherapy with carboplatin and Alimta without difficulty. He did have some mild nausea but it was well controlled with his anti-emetic medication. He does report a dry cough that occurs and spells. He denied any fever or chills. He has occasional constipation. He reports a good appetite. He is sleeping fairly well but does occasionally wake up around 4:30 in the morning with an inability to return back to sleep. As you discussed with Dr. Arbutus Ped he did not receive dexamethasone and his premedications nor did he take dexamethasone as a oral premedication due to his concern related to agitation aggravation of his bipolar disorder.    MEDICAL HISTORY: Past Medical History  Diagnosis Date  . BPH (benign prostatic hyperplasia)   . Hypogonadism male   . Bipolar 1 disorder   . ED (erectile dysfunction)   . Hyperlipidemia   . Neuromuscular disorder     peripheral neuropathy  . Cataract 2010    Bilateral  . Laryngopharyngeal reflux   . Gilbert's syndrome   . Deviated septum     TO THE LEFT  . Morton's neuroma     LEFT FOOT  . Lung mass 02/07/13    RIGHT UPPER LOBE  . Shortness of breath     voice  breaks, ? related to reflux, although told by Dr. Christella Hartigan, S.- early 36 yr. old  . GERD (gastroesophageal reflux disease)   . Cancer     lung  . Arthritis     psoriatic arthritis, ? , treated /w mmethotrexate   . Finger injury     mallet finger-- 02/13/2013, cast in place, followed by dr. Merlyn Lot    ALLERGIES:  is allergic to codeine; other; and zoloft.  MEDICATIONS:  Current Outpatient Prescriptions  Medication Sig Dispense Refill  . aspirin EC 81 MG tablet Take 81 mg by mouth daily.      . calcipotriene (DOVONOX) 0.005 % cream Apply 1 application topically daily as needed (for hands).       . Calcium Carbonate (CALCIUM 600 PO) Take 1 tablet by mouth daily.      . Cholecalciferol (VITAMIN D-3) 1000 UNITS CAPS Take 1,000 Units by mouth daily.       . clobetasol cream (TEMOVATE) 0.05 % Apply 1 application topically daily.       . clonazePAM (KLONOPIN) 0.5 MG tablet Take 0.5 mg by mouth at bedtime.      . colesevelam (WELCHOL) 625 MG tablet Take 3,750 mg by mouth daily with supper.      . cyanocobalamin 1000 MCG tablet Take 1,000 mcg by mouth daily.      Marland Kitchen esomeprazole (NEXIUM) 40 MG capsule Take 40 mg by mouth daily before breakfast.      .  finasteride (PROSCAR) 5 MG tablet Take 5 mg by mouth daily before breakfast.       . folic acid (FOLVITE) 1 MG tablet Take 1 tablet (1 mg total) by mouth daily.  30 tablet  1  . gabapentin (NEURONTIN) 300 MG capsule Take 600-900 mg by mouth 2 (two) times daily. Takes 2 capsules every morning, 300mg  x3 at night      . glucosamine-chondroitin 500-400 MG tablet Take 1 tablet by mouth 2 (two) times daily.       Marland Kitchen lithium carbonate (LITHOBID) 300 MG CR tablet Take 300 mg by mouth at bedtime.      . Multiple Vitamins-Minerals (MULTIVITAMIN PO) Take 2 tablets by mouth daily.       . prochlorperazine (COMPAZINE) 10 MG tablet Take 1 tablet (10 mg total) by mouth every 6 (six) hours as needed.  30 tablet  0  . propranolol (INDERAL) 10 MG tablet Take 10-20 mg by  mouth daily as needed (Takes rarely, only under extremely stressful circumstances.).       Marland Kitchen testosterone cypionate (DEPOTESTOTERONE CYPIONATE) 100 MG/ML injection Inject 100 mg into the muscle every Friday. For IM use only      . vardenafil (LEVITRA) 5 MG tablet Take 5 mg by mouth daily as needed for erectile dysfunction.        No current facility-administered medications for this visit.    SURGICAL HISTORY:  Past Surgical History  Procedure Laterality Date  . Prostate surgery  1996    TURP  . Knee arthroscopy Right 01/2007  . Pilonidal cyst excision  1960's  . Eye surgery      cataracts removed fr. both eyes, IOL in place   . Video assisted thoracoscopy (vats)/wedge resection Right 02/21/2013    Procedure: Right VIDEO ASSISTED THORACOSCOPY ,Thoracotomy with right upper lobectomy, node sampling ;  Surgeon: Delight Ovens, MD;  Location: [facility] OR;  Service: Thoracic;  Laterality: Right;  . Video bronchoscopy N/A 02/21/2013    Procedure: VIDEO BRONCHOSCOPY;  Surgeon: Delight Ovens, MD;  Location: [facility] OR;  Service: Thoracic;  Laterality: N/A;  . Cystoscopy N/A 02/21/2013    Procedure: CYSTOSCOPY FLEXIBLE with insertion of foley catheter;  Surgeon: Antony Haste, MD;  Location: [facility] OR;  Service: Urology;  Laterality: N/A;    REVIEW OF SYSTEMS:  A comprehensive review of systems was negative except for: Respiratory: positive for cough Gastrointestinal: positive for nausea   PHYSICAL EXAMINATION: General appearance: alert, cooperative and no distress Head: Normocephalic, without obvious abnormality, atraumatic Neck: no adenopathy Lymph nodes: Cervical, supraclavicular, and axillary nodes normal. Resp: clear to auscultation bilaterally Cardio: regular rate and rhythm, S1, S2 normal, no murmur, click, rub or gallop GI: soft, non-tender; bowel sounds normal; no masses,  no organomegaly Extremities: extremities normal, atraumatic, no cyanosis or edema Neurologic: Alert and oriented  X 3, normal strength and tone. Normal symmetric reflexes. Normal coordination and gait  ECOG PERFORMANCE STATUS: 1 - Symptomatic but completely ambulatory  Blood pressure 139/83, pulse 99, temperature 97.4 F (36.3 C), temperature source Oral, resp. rate 18, height 5\' 10"  (1.778 m), weight 183 lb 9.6 oz (83.28 kg).  LABORATORY DATA: Lab Results  Component Value Date   WBC 6.5 04/03/2013   HGB 14.2 04/03/2013   HCT 41.2 04/03/2013   MCV 89.4 04/03/2013   PLT 194 04/03/2013      Chemistry      Component Value Date/Time   NA 139 04/03/2013 1325   NA 138 02/23/2013 0419  K 4.1 04/03/2013 1325   K 3.9 02/23/2013 0419   CL 106 04/03/2013 1325   CL 104 02/23/2013 0419   CO2 23 04/03/2013 1325   CO2 28 02/23/2013 0419   BUN 14.0 04/03/2013 1325   BUN 9 02/23/2013 0419   CREATININE 0.9 04/03/2013 1325   CREATININE 0.89 02/23/2013 0419      Component Value Date/Time   CALCIUM 8.9 04/03/2013 1325   CALCIUM 8.8 02/23/2013 0419   ALKPHOS 64 04/03/2013 1325   ALKPHOS 43 02/23/2013 0419   AST 16 04/03/2013 1325   AST 17 02/23/2013 0419   ALT 23 04/03/2013 1325   ALT 12 02/23/2013 0419   BILITOT 1.10 04/03/2013 1325   BILITOT 1.2 02/23/2013 0419       RADIOGRAPHIC STUDIES: Dg Chest 2 View  03/15/2013   *RADIOLOGY REPORT*  Clinical Data: Lung cancer  CHEST - 2 VIEW  Comparison: 02/26/2013  Findings: Postop surgical clips overlying the right hilum. Right apical mass has been removed.  Improvement in right hilar density compared with the prior study.  PET CT from 02/14/2013 did not revealed tumor in this area and this density may have been mediastinal fluid related to central venous catheter on the prior study.  Right-sided pneumothorax has resolved.  Right-sided central venous catheter has been removed.  1 cm nodule left lung base most likely a nipple shadow.  Negative for heart failure or pneumonia.  IMPRESSION: Postop changes in the right with resolving density around the right hilum.  Probable nipple shadow left lung base.   Resolution of right-sided pneumothorax.   Original Report Authenticated By: Janeece Riggers, M.D.   Dg Chest 2 View  02/26/2013   *RADIOLOGY REPORT*  Clinical Data: 78 year old male status post chest tube removal. Right upper lobectomy.  Right upper lung cancer.  CHEST - 2 VIEW  Comparison: 02/25/2013 and earlier.  Findings: Stable small right pneumothorax visible at the apex and to an extent that on the side of the right hemithorax.  Stable right subclavian central line.  Stable right hilar/mediastinal increased density and contour deformity.  Superimposed surgical clips.  Continued low lung volumes.  Small bilateral pleural effusions.  No confluent opacity in the right lung. Visualized tracheal air column is within normal limits.  Small volume right chest wall subcutaneous gas. Stable visualized osseous structures.  IMPRESSION: 1.  Stable small right lung pneumothorax. 2.  Continued increased density and rounded contour at the right hilum.  Postoperative fluid or hematoma may be present.  Chest CT (IV contrast preferred) would confirm. 3.  Continued low lung volumes.  Small bilateral pleural effusions.   Original Report Authenticated By: Erskine Speed, M.D.   Dg Chest Port 1 View  02/25/2013   *RADIOLOGY REPORT*  Clinical Data: Status post removal of right-sided chest tube.  PORTABLE CHEST - 1 VIEW  Comparison: Chest x-ray 02/25/2013.  Findings: Previously noted right-sided chest tube has been removed. A small right pneumothorax has increased in size now occupying approximately 15% of the right hemithorax.  Postoperative changes of right upper lobectomy are noted.  Similar postoperative mass- like opacity in the right perihilar region may represent atelectatic lung or postoperative fluid collection (which may be loculated).  This appears slightly larger than the prior examination.  Left lung is clear.  No left pleural effusion.  Heart size is within normal limits.  Mediastinal contours are distorted by patient  positioning. There is a right-sided subclavian central venous catheter with tip terminating in the mid superior vena  cava. Small amount of subcutaneous emphysema in the right chest wall.  IMPRESSION: 1.  Status post removal of right-sided chest tube with slight enlargement of a right-sided pneumothorax, as above. 2.  Slight increase in size of right perihilar mass like opacity, favored to represent some loculated postoperative fluid. 3.  Additional findings, as above, similar to prior studies.  These results were called by telephone on 02/25/2013 at 07:00 p.m. to nurse Alphonzo Lemmings, who verbally acknowledged these results.   Original Report Authenticated By: Trudie Reed, M.D.    ASSESSMENT/PLAN: This is a very pleasant 78 years old white male with history of stage IIIA non-small cell lung cancer, adenocarcinoma.  He is status post right upper lobectomy with lymph node dissection. He is currently receiving adjuvant chemotherapy in the form of carboplatin and Alimta. Of note he is not receiving dexamethasone due to not being willing to take any chances of aggravation of his bipolar disorder which has been stable over the last several years and he is currently on maintenance treatment. He had no problems with rash development. He tolerated his first cycle of adjuvant chemotherapy without difficulty. Patient was discussed with Dr. Arbutus Ped. He will continue with weekly labs as scheduled. He'll follow with Dr. Arbutus Ped prior to cycle #2 of his adjuvant chemotherapy with carboplatin and Alimta with a CBC differential and C. met. He will monitor his cough symptoms. He is scheduled to see Dr. Tyrone Sage in followup in mid August 2014 but will schedule an earlier appointment if his symptoms become more problematic.  Alvin Takeda E, PA-C   He was advised to call immediately she has any concerning symptoms in the interval.  All questions were answered. The patient knows to call the clinic with any problems, questions  or concerns. We can certainly see the patient much sooner if necessary.

## 2013-04-03 NOTE — Patient Instructions (Addendum)
Continue with weekly labs as scheduled Follow up with Dr. Arbutus Ped in 2 weeks, prior to your next scheduled cycle of adjuvant chemotherapy

## 2013-04-03 NOTE — Telephone Encounter (Signed)
Gave pt appt for lab and MD for June , chemo needs to be adjusted and July chemo needs to be scheduled

## 2013-04-07 ENCOUNTER — Other Ambulatory Visit: Payer: Self-pay | Admitting: Internal Medicine

## 2013-04-09 ENCOUNTER — Telehealth: Payer: Self-pay | Admitting: Internal Medicine

## 2013-04-09 NOTE — Telephone Encounter (Signed)
Talked to pt and gave him appt for lab, MD and chemo for June and July 2014

## 2013-04-10 ENCOUNTER — Other Ambulatory Visit: Payer: Medicare Other | Admitting: Lab

## 2013-04-10 ENCOUNTER — Other Ambulatory Visit (HOSPITAL_BASED_OUTPATIENT_CLINIC_OR_DEPARTMENT_OTHER): Payer: Federal, State, Local not specified - PPO

## 2013-04-10 DIAGNOSIS — C3490 Malignant neoplasm of unspecified part of unspecified bronchus or lung: Secondary | ICD-10-CM

## 2013-04-10 LAB — CBC WITH DIFFERENTIAL/PLATELET
Eosinophils Absolute: 0.2 10*3/uL (ref 0.0–0.5)
HGB: 15.5 g/dL (ref 13.0–17.1)
MCV: 89.7 fL (ref 79.3–98.0)
MONO%: 12.7 % (ref 0.0–14.0)
NEUT#: 4.3 10*3/uL (ref 1.5–6.5)
RBC: 4.95 10*6/uL (ref 4.20–5.82)
RDW: 13.7 % (ref 11.0–14.6)
WBC: 8.2 10*3/uL (ref 4.0–10.3)
lymph#: 2.6 10*3/uL (ref 0.9–3.3)

## 2013-04-10 LAB — COMPREHENSIVE METABOLIC PANEL (CC13)
AST: 21 U/L (ref 5–34)
Albumin: 4.1 g/dL (ref 3.5–5.0)
Alkaline Phosphatase: 62 U/L (ref 40–150)
Calcium: 9.6 mg/dL (ref 8.4–10.4)
Chloride: 107 mEq/L (ref 98–107)
Glucose: 100 mg/dl — ABNORMAL HIGH (ref 70–99)
Potassium: 4.5 mEq/L (ref 3.5–5.1)
Sodium: 141 mEq/L (ref 136–145)
Total Protein: 7 g/dL (ref 6.4–8.3)

## 2013-04-17 ENCOUNTER — Ambulatory Visit (HOSPITAL_BASED_OUTPATIENT_CLINIC_OR_DEPARTMENT_OTHER): Payer: Medicare Other

## 2013-04-17 ENCOUNTER — Other Ambulatory Visit (HOSPITAL_BASED_OUTPATIENT_CLINIC_OR_DEPARTMENT_OTHER): Payer: Federal, State, Local not specified - PPO | Admitting: Lab

## 2013-04-17 ENCOUNTER — Ambulatory Visit (HOSPITAL_BASED_OUTPATIENT_CLINIC_OR_DEPARTMENT_OTHER): Payer: Federal, State, Local not specified - PPO | Admitting: Internal Medicine

## 2013-04-17 ENCOUNTER — Telehealth: Payer: Self-pay | Admitting: *Deleted

## 2013-04-17 ENCOUNTER — Telehealth: Payer: Self-pay | Admitting: Internal Medicine

## 2013-04-17 ENCOUNTER — Other Ambulatory Visit: Payer: Medicare Other | Admitting: Lab

## 2013-04-17 ENCOUNTER — Encounter: Payer: Self-pay | Admitting: Internal Medicine

## 2013-04-17 ENCOUNTER — Ambulatory Visit: Payer: Federal, State, Local not specified - PPO | Admitting: Physician Assistant

## 2013-04-17 DIAGNOSIS — C3410 Malignant neoplasm of upper lobe, unspecified bronchus or lung: Secondary | ICD-10-CM

## 2013-04-17 DIAGNOSIS — Z5111 Encounter for antineoplastic chemotherapy: Secondary | ICD-10-CM

## 2013-04-17 LAB — CBC WITH DIFFERENTIAL/PLATELET
EOS%: 1.6 % (ref 0.0–7.0)
MCH: 30.8 pg (ref 27.2–33.4)
MCV: 90.4 fL (ref 79.3–98.0)
MONO%: 11 % (ref 0.0–14.0)
NEUT#: 3.2 10*3/uL (ref 1.5–6.5)
RBC: 4.81 10*6/uL (ref 4.20–5.82)
RDW: 13.6 % (ref 11.0–14.6)

## 2013-04-17 LAB — COMPREHENSIVE METABOLIC PANEL (CC13)
ALT: 26 U/L (ref 0–55)
AST: 18 U/L (ref 5–34)
Albumin: 3.7 g/dL (ref 3.5–5.0)
Alkaline Phosphatase: 63 U/L (ref 40–150)
Potassium: 4 mEq/L (ref 3.5–5.1)
Sodium: 141 mEq/L (ref 136–145)
Total Protein: 6.7 g/dL (ref 6.4–8.3)

## 2013-04-17 MED ORDER — SODIUM CHLORIDE 0.9 % IV SOLN
500.0000 mg/m2 | Freq: Once | INTRAVENOUS | Status: AC
  Administered 2013-04-17: 1000 mg via INTRAVENOUS
  Filled 2013-04-17: qty 40

## 2013-04-17 MED ORDER — SODIUM CHLORIDE 0.9 % IV SOLN
490.0000 mg | Freq: Once | INTRAVENOUS | Status: AC
  Administered 2013-04-17: 490 mg via INTRAVENOUS
  Filled 2013-04-17: qty 49

## 2013-04-17 MED ORDER — SODIUM CHLORIDE 0.9 % IV SOLN
Freq: Once | INTRAVENOUS | Status: AC
  Administered 2013-04-17: 10:00:00 via INTRAVENOUS

## 2013-04-17 MED ORDER — ONDANSETRON 16 MG/50ML IVPB (CHCC)
16.0000 mg | Freq: Once | INTRAVENOUS | Status: AC
  Administered 2013-04-17: 16 mg via INTRAVENOUS

## 2013-04-17 NOTE — Patient Instructions (Signed)
Continue adjuvant chemotherapy today as scheduled.  Followup in 3 weeks.

## 2013-04-17 NOTE — Telephone Encounter (Signed)
gv and printed appt sched and avs...emailed MW to add tx...pt ok and aware    °

## 2013-04-17 NOTE — Patient Instructions (Addendum)
[facility] Discharge Instructions for Patients Receiving Chemotherapy  Today you received the following chemotherapy agents ALIMTA, CARBOPLATIN  To help prevent nausea and vomiting after your treatment, we encourage you to take your nausea medication if needed.   If you develop nausea and vomiting that is not controlled by your nausea medication, call the clinic.   BELOW ARE SYMPTOMS THAT SHOULD BE REPORTED IMMEDIATELY:  *FEVER GREATER THAN 100.5 F  *CHILLS WITH OR WITHOUT FEVER  NAUSEA AND VOMITING THAT IS NOT CONTROLLED WITH YOUR NAUSEA MEDICATION  *UNUSUAL SHORTNESS OF BREATH  *UNUSUAL BRUISING OR BLEEDING  TENDERNESS IN MOUTH AND THROAT WITH OR WITHOUT PRESENCE OF ULCERS  *URINARY PROBLEMS  *BOWEL PROBLEMS  UNUSUAL RASH Items with * indicate a potential emergency and should be followed up as soon as possible.  Feel free to call the clinic you have any questions or concerns. The clinic phone number is [phone].

## 2013-04-17 NOTE — Progress Notes (Signed)
[facility] [phone]   Fax:(336) [phone]  OFFICE PROGRESS NOTE  [name], MD [address] [place] [address]  DIAGNOSIS: Stage III A (T2 A., N2, M0) non-small cell lung cancer, adenocarcinoma with positive EGFR mutation in exon 19 and negative ALK gene translocation diagnosed in April 2014   PRIOR THERAPY: Status post right upper lobectomy with lymph node dissection under the care of Dr. Tyrone Sage on 02/21/2013   CURRENT THERAPY: Systemic adjuvant chemotherapy with carboplatin for AUC of 5 and Alimta 500 mg/M2 every 3 weeks. Status post 1 cycle  INTERVAL HISTORY: ZYDEN SUMAN 78 y.o. male returns to the clinic today for followup visit. The patient is feeling fine today with no specific complaints. He tolerated these first cycle of his systemic chemotherapy with carboplatin and Alimta fairly well with no significant adverse effects except for one episode of nausea relieved immediately with Compazine. He did not receive Decadron as premedication or antiemetic during the first cycle because of concern about interaction with his bipolar medication. The patient denied having any significant weight loss or night sweats. He denied having any chest pain, shortness breath, cough or hemoptysis.  MEDICAL HISTORY: Past Medical History  Diagnosis Date  . BPH (benign prostatic hyperplasia)   . Hypogonadism male   . Bipolar 1 disorder   . ED (erectile dysfunction)   . Hyperlipidemia   . Neuromuscular disorder     peripheral neuropathy  . Cataract 2010    Bilateral  . Laryngopharyngeal reflux   . Gilbert's syndrome   . Deviated septum     TO THE LEFT  . Morton's neuroma     LEFT FOOT  . Lung mass 02/07/13    RIGHT UPPER LOBE  . Shortness of breath     voice breaks, ? related to reflux, although told by Dr. Christella Hartigan, S.- early 16 yr. old  . GERD (gastroesophageal reflux disease)   . Cancer     lung  . Arthritis     psoriatic arthritis, ? , treated  /w mmethotrexate   . Finger injury     mallet finger-- 02/13/2013, cast in place, followed by dr. Merlyn Lot    ALLERGIES:  is allergic to codeine; other; and zoloft.  MEDICATIONS:  Current Outpatient Prescriptions  Medication Sig Dispense Refill  . aspirin EC 81 MG tablet Take 81 mg by mouth daily.      . calcipotriene (DOVONOX) 0.005 % cream Apply 1 application topically daily as needed (for hands).       . Calcium Carbonate (CALCIUM 600 PO) Take 1 tablet by mouth daily.      . Cholecalciferol (VITAMIN D-3) 1000 UNITS CAPS Take 1,000 Units by mouth daily.       . clobetasol cream (TEMOVATE) 0.05 % Apply 1 application topically daily.       . clonazePAM (KLONOPIN) 0.5 MG tablet Take 0.5 mg by mouth at bedtime.      . cyanocobalamin 1000 MCG tablet Take 1,000 mcg by mouth daily.      Marland Kitchen esomeprazole (NEXIUM) 40 MG capsule Take 40 mg by mouth daily before breakfast.      . finasteride (PROSCAR) 5 MG tablet Take 5 mg by mouth daily before breakfast.       . folic acid (FOLVITE) 1 MG tablet TAKE 1 TABLET BY MOUTH EVERY DAY  30 tablet  0  . gabapentin (NEURONTIN) 300 MG capsule Take 600-900 mg by mouth 2 (two) times daily. Takes 2  capsules every morning, 300mg  x3 at night      . glucosamine-chondroitin 500-400 MG tablet Take 1 tablet by mouth 2 (two) times daily.       Marland Kitchen lithium carbonate (LITHOBID) 300 MG CR tablet Take 300 mg by mouth at bedtime.      . Multiple Vitamins-Minerals (MULTIVITAMIN PO) Take 2 tablets by mouth daily.       . prochlorperazine (COMPAZINE) 10 MG tablet Take 1 tablet (10 mg total) by mouth every 6 (six) hours as needed.  30 tablet  0  . propranolol (INDERAL) 10 MG tablet Take 10-20 mg by mouth daily as needed (Takes rarely, only under extremely stressful circumstances.).       Marland Kitchen testosterone cypionate (DEPOTESTOTERONE CYPIONATE) 100 MG/ML injection Inject 100 mg into the muscle every Friday. For IM use only      . vardenafil (LEVITRA) 5 MG tablet Take 5 mg by mouth daily as  needed for erectile dysfunction.       . colesevelam (WELCHOL) 625 MG tablet Take 3,750 mg by mouth daily with supper.       No current facility-administered medications for this visit.    SURGICAL HISTORY:  Past Surgical History  Procedure Laterality Date  . Prostate surgery  1996    TURP  . Knee arthroscopy Right 01/2007  . Pilonidal cyst excision  1960's  . Eye surgery      cataracts removed fr. both eyes, IOL in place   . Video assisted thoracoscopy (vats)/wedge resection Right 02/21/2013    Procedure: Right VIDEO ASSISTED THORACOSCOPY ,Thoracotomy with right upper lobectomy, node sampling ;  Surgeon: Delight Ovens, MD;  Location: [facility] OR;  Service: Thoracic;  Laterality: Right;  . Video bronchoscopy N/A 02/21/2013    Procedure: VIDEO BRONCHOSCOPY;  Surgeon: Delight Ovens, MD;  Location: [facility] OR;  Service: Thoracic;  Laterality: N/A;  . Cystoscopy N/A 02/21/2013    Procedure: CYSTOSCOPY FLEXIBLE with insertion of foley catheter;  Surgeon: Antony Haste, MD;  Location: [facility] OR;  Service: Urology;  Laterality: N/A;    REVIEW OF SYSTEMS:  A comprehensive review of systems was negative.   PHYSICAL EXAMINATION: General appearance: alert, cooperative and no distress Head: Normocephalic, without obvious abnormality, atraumatic Neck: no adenopathy Lymph nodes: Cervical, supraclavicular, and axillary nodes normal. Resp: clear to auscultation bilaterally Cardio: regular rate and rhythm, S1, S2 normal, no murmur, click, rub or gallop GI: soft, non-tender; bowel sounds normal; no masses,  no organomegaly Extremities: extremities normal, atraumatic, no cyanosis or edema  ECOG PERFORMANCE STATUS: 0 - Asymptomatic  Blood pressure 146/87, pulse 101, temperature 97.2 F (36.2 C), temperature source Oral, resp. rate 20, height 5\' 10"  (1.778 m), weight 186 lb 8 oz (84.596 kg).  LABORATORY DATA: Lab Results  Component Value Date   WBC 6.4 04/17/2013   HGB 14.8 04/17/2013   HCT 43.5  04/17/2013   MCV 90.4 04/17/2013   PLT 198 04/17/2013      Chemistry      Component Value Date/Time   NA 141 04/10/2013 1130   NA 138 02/23/2013 0419   K 4.5 04/10/2013 1130   K 3.9 02/23/2013 0419   CL 107 04/10/2013 1130   CL 104 02/23/2013 0419   CO2 24 04/10/2013 1130   CO2 28 02/23/2013 0419   BUN 14.1 04/10/2013 1130   BUN 9 02/23/2013 0419   CREATININE 1.0 04/10/2013 1130   CREATININE 0.89 02/23/2013 0419      Component Value Date/Time  CALCIUM 9.6 04/10/2013 1130   CALCIUM 8.8 02/23/2013 0419   ALKPHOS 62 04/10/2013 1130   ALKPHOS 43 02/23/2013 0419   AST 21 04/10/2013 1130   AST 17 02/23/2013 0419   ALT 27 04/10/2013 1130   ALT 12 02/23/2013 0419   BILITOT 1.69 Repeated and Verified* 04/10/2013 1130   BILITOT 1.2 02/23/2013 0419       RADIOGRAPHIC STUDIES: No results found.  ASSESSMENT AND PLAN: This is a very pleasant 78 years old white male with history of stage IIIa non-small cell lung cancer status post right upper lobectomy with lymph node dissection and currently undergoing adjuvant chemotherapy was carboplatin and Alimta status post 1 cycle. The patient is tolerating his treatment fairly well with no significant adverse effects. I recommended for him to continue with the second cycle of his chemotherapy today as scheduled. The patient would come back for followup visit in 3 weeks with the next cycle of his treatment. He was advised to call immediately if he has any concerning symptoms in the interval.  All questions were answered. The patient knows to call the clinic with any problems, questions or concerns. We can certainly see the patient much sooner if necessary.

## 2013-04-17 NOTE — Telephone Encounter (Signed)
Per staff message and POF I have scheduled appts.  JMW  

## 2013-04-23 ENCOUNTER — Telehealth: Payer: Self-pay | Admitting: *Deleted

## 2013-04-23 NOTE — Telephone Encounter (Signed)
Pt called left message regarding concerns of his finger.  Finger has been injured and would like to take anti-inflammatory medication.   I spoke with Dr. Arbutus Ped and he stated he may take anti-inflammatory in moderation or Tylenol.   Alvin Hardy verbalized understanding

## 2013-04-24 ENCOUNTER — Other Ambulatory Visit: Payer: Medicare Other | Admitting: Lab

## 2013-04-24 ENCOUNTER — Ambulatory Visit (HOSPITAL_BASED_OUTPATIENT_CLINIC_OR_DEPARTMENT_OTHER): Payer: Medicare Other | Admitting: Lab

## 2013-04-24 DIAGNOSIS — C3410 Malignant neoplasm of upper lobe, unspecified bronchus or lung: Secondary | ICD-10-CM

## 2013-04-24 LAB — COMPREHENSIVE METABOLIC PANEL (CC13)
BUN: 15.1 mg/dL (ref 7.0–26.0)
CO2: 24 mEq/L (ref 22–29)
Calcium: 9.2 mg/dL (ref 8.4–10.4)
Chloride: 107 mEq/L (ref 98–107)
Creatinine: 0.9 mg/dL (ref 0.7–1.3)
Glucose: 83 mg/dl (ref 70–99)
Total Bilirubin: 1.35 mg/dL — ABNORMAL HIGH (ref 0.20–1.20)

## 2013-04-24 LAB — CBC WITH DIFFERENTIAL/PLATELET
BASO%: 1.4 % (ref 0.0–2.0)
Basophils Absolute: 0.1 10*3/uL (ref 0.0–0.1)
HCT: 41.2 % (ref 38.4–49.9)
HGB: 14.3 g/dL (ref 13.0–17.1)
LYMPH%: 47.8 % (ref 14.0–49.0)
MCHC: 34.8 g/dL (ref 32.0–36.0)
MONO#: 0.5 10*3/uL (ref 0.1–0.9)
NEUT%: 40 % (ref 39.0–75.0)
Platelets: 151 10*3/uL (ref 140–400)
WBC: 5.7 10*3/uL (ref 4.0–10.3)
lymph#: 2.7 10*3/uL (ref 0.9–3.3)

## 2013-04-25 ENCOUNTER — Encounter: Payer: Self-pay | Admitting: Specialist

## 2013-04-25 NOTE — Progress Notes (Signed)
Alvin Hardy stopped by my office on June 24 to follow-up on a previous conversation we had.  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  He is processing this cancer experience and how it might impact him emotionally, spiritually, and creatively.  He has maintained a very positive outlook.

## 2013-05-01 ENCOUNTER — Other Ambulatory Visit (HOSPITAL_BASED_OUTPATIENT_CLINIC_OR_DEPARTMENT_OTHER): Payer: Medicare Other | Admitting: Lab

## 2013-05-01 DIAGNOSIS — C3490 Malignant neoplasm of unspecified part of unspecified bronchus or lung: Secondary | ICD-10-CM

## 2013-05-01 LAB — CBC WITH DIFFERENTIAL/PLATELET
BASO%: 0.4 % (ref 0.0–2.0)
Eosinophils Absolute: 0.2 10*3/uL (ref 0.0–0.5)
HCT: 41.9 % (ref 38.4–49.9)
HGB: 14.7 g/dL (ref 13.0–17.1)
LYMPH%: 31.8 % (ref 14.0–49.0)
MCHC: 35 g/dL (ref 32.0–36.0)
MONO#: 0.8 10*3/uL (ref 0.1–0.9)
NEUT#: 4.2 10*3/uL (ref 1.5–6.5)
NEUT%: 55 % (ref 39.0–75.0)
Platelets: 136 10*3/uL — ABNORMAL LOW (ref 140–400)
WBC: 7.7 10*3/uL (ref 4.0–10.3)
lymph#: 2.4 10*3/uL (ref 0.9–3.3)

## 2013-05-01 LAB — COMPREHENSIVE METABOLIC PANEL (CC13)
ALT: 49 U/L (ref 0–55)
CO2: 24 mEq/L (ref 22–29)
Calcium: 9.6 mg/dL (ref 8.4–10.4)
Chloride: 107 mEq/L (ref 98–109)
Creatinine: 1 mg/dL (ref 0.7–1.3)
Glucose: 132 mg/dl (ref 70–140)
Total Bilirubin: 1.15 mg/dL (ref 0.20–1.20)
Total Protein: 6.7 g/dL (ref 6.4–8.3)

## 2013-05-08 ENCOUNTER — Ambulatory Visit (HOSPITAL_BASED_OUTPATIENT_CLINIC_OR_DEPARTMENT_OTHER): Payer: Medicare Other

## 2013-05-08 ENCOUNTER — Other Ambulatory Visit (HOSPITAL_BASED_OUTPATIENT_CLINIC_OR_DEPARTMENT_OTHER): Payer: Medicare Other | Admitting: Lab

## 2013-05-08 ENCOUNTER — Encounter: Payer: Self-pay | Admitting: Internal Medicine

## 2013-05-08 ENCOUNTER — Ambulatory Visit (HOSPITAL_BASED_OUTPATIENT_CLINIC_OR_DEPARTMENT_OTHER): Payer: Medicare Other | Admitting: Internal Medicine

## 2013-05-08 ENCOUNTER — Telehealth: Payer: Self-pay | Admitting: Internal Medicine

## 2013-05-08 DIAGNOSIS — Z5111 Encounter for antineoplastic chemotherapy: Secondary | ICD-10-CM

## 2013-05-08 DIAGNOSIS — C771 Secondary and unspecified malignant neoplasm of intrathoracic lymph nodes: Secondary | ICD-10-CM

## 2013-05-08 DIAGNOSIS — C3410 Malignant neoplasm of upper lobe, unspecified bronchus or lung: Secondary | ICD-10-CM

## 2013-05-08 LAB — COMPREHENSIVE METABOLIC PANEL (CC13)
AST: 22 U/L (ref 5–34)
Albumin: 3.9 g/dL (ref 3.5–5.0)
Alkaline Phosphatase: 55 U/L (ref 40–150)
BUN: 12 mg/dL (ref 7.0–26.0)
Glucose: 90 mg/dl (ref 70–140)
Potassium: 4.3 mEq/L (ref 3.5–5.1)
Sodium: 141 mEq/L (ref 136–145)
Total Bilirubin: 1.16 mg/dL (ref 0.20–1.20)
Total Protein: 6.7 g/dL (ref 6.4–8.3)

## 2013-05-08 LAB — CBC WITH DIFFERENTIAL/PLATELET
BASO%: 0.7 % (ref 0.0–2.0)
Eosinophils Absolute: 0.1 10*3/uL (ref 0.0–0.5)
HCT: 43.6 % (ref 38.4–49.9)
MCHC: 35.1 g/dL (ref 32.0–36.0)
MONO#: 0.8 10*3/uL (ref 0.1–0.9)
NEUT#: 3.2 10*3/uL (ref 1.5–6.5)
RBC: 4.82 10*6/uL (ref 4.20–5.82)
WBC: 6.8 10*3/uL (ref 4.0–10.3)
lymph#: 2.6 10*3/uL (ref 0.9–3.3)

## 2013-05-08 MED ORDER — ONDANSETRON 16 MG/50ML IVPB (CHCC)
16.0000 mg | Freq: Once | INTRAVENOUS | Status: AC
  Administered 2013-05-08: 16 mg via INTRAVENOUS

## 2013-05-08 MED ORDER — SODIUM CHLORIDE 0.9 % IV SOLN
500.0000 mg/m2 | Freq: Once | INTRAVENOUS | Status: AC
  Administered 2013-05-08: 1000 mg via INTRAVENOUS
  Filled 2013-05-08: qty 40

## 2013-05-08 MED ORDER — SODIUM CHLORIDE 0.9 % IV SOLN
530.0000 mg | Freq: Once | INTRAVENOUS | Status: AC
  Administered 2013-05-08: 530 mg via INTRAVENOUS
  Filled 2013-05-08: qty 53

## 2013-05-08 MED ORDER — CYANOCOBALAMIN 1000 MCG/ML IJ SOLN
1000.0000 ug | Freq: Once | INTRAMUSCULAR | Status: AC
  Administered 2013-05-08: 1000 ug via INTRAMUSCULAR

## 2013-05-08 MED ORDER — SODIUM CHLORIDE 0.9 % IV SOLN
Freq: Once | INTRAVENOUS | Status: AC
Start: 2013-05-08 — End: 2013-05-08
  Administered 2013-05-08: 11:00:00 via INTRAVENOUS

## 2013-05-08 NOTE — Telephone Encounter (Signed)
Gave pt appt for July , lab,MD and chemo and referred pt to Dr. Merry Proud, psychology @ [facility], talked to Jansen and she will call pt

## 2013-05-08 NOTE — Patient Instructions (Signed)
Continue chemotherapy today as scheduled.  Followup visit in 3 weeks with the next cycle of chemotherapy 

## 2013-05-08 NOTE — Patient Instructions (Signed)
[facility] Discharge Instructions for Patients Receiving Chemotherapy  Today you received the following chemotherapy agents: Alimta and Carboplatin.  To help prevent nausea and vomiting after your treatment, we encourage you to take your nausea medication, Compazine, as needed.   If you develop nausea and vomiting that is not controlled by your nausea medication, call the clinic.   BELOW ARE SYMPTOMS THAT SHOULD BE REPORTED IMMEDIATELY:  *FEVER GREATER THAN 100.5 F  *CHILLS WITH OR WITHOUT FEVER  NAUSEA AND VOMITING THAT IS NOT CONTROLLED WITH YOUR NAUSEA MEDICATION  *UNUSUAL SHORTNESS OF BREATH  *UNUSUAL BRUISING OR BLEEDING  TENDERNESS IN MOUTH AND THROAT WITH OR WITHOUT PRESENCE OF ULCERS  *URINARY PROBLEMS  *BOWEL PROBLEMS  UNUSUAL RASH Items with * indicate a potential emergency and should be followed up as soon as possible.  Feel free to call the clinic should you have any questions or concerns. The clinic phone number is [phone].

## 2013-05-08 NOTE — Progress Notes (Signed)
[facility] [phone]   [phone]  OFFICE PROGRESS NOTE  [name], MD [address] [place] [address]  DIAGNOSIS: Stage III A (T2 A., N2, M0) non-small cell lung cancer, adenocarcinoma with positive EGFR mutation in exon 19 and negative ALK gene translocation diagnosed in April 2014   PRIOR THERAPY: Status post right upper lobectomy with lymph node dissection under the care of Dr. Tyrone Sage on 02/21/2013   CURRENT THERAPY: Systemic adjuvant chemotherapy with carboplatin for AUC of 5 and Alimta 500 mg/M2 every 3 weeks. Status post 2 cycles.   INTERVAL HISTORY: Alvin Hardy 78 y.o. male returns to the clinic today for followup visit accompanied his wife. The patient is feeling fine today with no specific complaints. He tolerated the second cycle of her systemic chemotherapy with carboplatin and Alimta fairly well with no significant adverse effects. The patient has some psychological issue of dealing with his kids as he did not show any support to him doing his treatment. He would like to see a psychologist. He denied having any significant fatigue or weakness. He denied having any nausea or vomiting. The patient denied having any significant chest pain, shortness of breath, cough or hemoptysis.  MEDICAL HISTORY: Past Medical History  Diagnosis Date  . BPH (benign prostatic hyperplasia)   . Hypogonadism male   . Bipolar 1 disorder   . ED (erectile dysfunction)   . Hyperlipidemia   . Neuromuscular disorder     peripheral neuropathy  . Cataract 2010    Bilateral  . Laryngopharyngeal reflux   . Gilbert's syndrome   . Deviated septum     TO THE LEFT  . Morton's neuroma     LEFT FOOT  . Lung mass 02/07/13    RIGHT UPPER LOBE  . Shortness of breath     voice breaks, ? related to reflux, although told by Dr. Christella Hartigan, S.- early 61 yr. old  . GERD (gastroesophageal reflux disease)   . Cancer     lung  . Arthritis     psoriatic  arthritis, ? , treated /w mmethotrexate   . Finger injury     mallet finger-- 02/13/2013, cast in place, followed by dr. Merlyn Lot    ALLERGIES:  is allergic to codeine; decadron; other; and zoloft.  MEDICATIONS:  Current Outpatient Prescriptions  Medication Sig Dispense Refill  . calcipotriene (DOVONOX) 0.005 % cream Apply 1 application topically daily as needed (for hands).       . Calcium Carbonate (CALCIUM 600 PO) Take 1 tablet by mouth daily.      . Cholecalciferol (VITAMIN D-3) 1000 UNITS CAPS Take 1,000 Units by mouth daily.       . clobetasol cream (TEMOVATE) 0.05 % Apply 1 application topically daily.       . clonazePAM (KLONOPIN) 0.5 MG tablet Take 0.5 mg by mouth at bedtime.      . cyanocobalamin 1000 MCG tablet Take 1,000 mcg by mouth daily.      Marland Kitchen esomeprazole (NEXIUM) 40 MG capsule Take 40 mg by mouth daily before breakfast.      . finasteride (PROSCAR) 5 MG tablet Take 5 mg by mouth daily before breakfast.       . folic acid (FOLVITE) 1 MG tablet TAKE 1 TABLET BY MOUTH EVERY DAY  30 tablet  0  . gabapentin (NEURONTIN) 300 MG capsule Take 600-900 mg by mouth 2 (two) times daily. Takes 2 capsules every morning, 300mg  x3 at night      .  glucosamine-chondroitin 500-400 MG tablet Take 1 tablet by mouth 2 (two) times daily.       Marland Kitchen lithium carbonate (LITHOBID) 300 MG CR tablet Take 300 mg by mouth at bedtime.      . Multiple Vitamins-Minerals (MULTIVITAMIN PO) Take 2 tablets by mouth daily.       . propranolol (INDERAL) 10 MG tablet Take 10-20 mg by mouth daily as needed (Takes rarely, only under extremely stressful circumstances.).       Marland Kitchen testosterone cypionate (DEPOTESTOTERONE CYPIONATE) 100 MG/ML injection Inject 100 mg into the muscle every Friday. For IM use only      . vardenafil (LEVITRA) 5 MG tablet Take 5 mg by mouth daily as needed for erectile dysfunction.       Marland Kitchen aspirin EC 81 MG tablet Take 81 mg by mouth daily.      . colesevelam (WELCHOL) 625 MG tablet Take 3,750 mg by  mouth daily with supper.      . prochlorperazine (COMPAZINE) 10 MG tablet Take 1 tablet (10 mg total) by mouth every 6 (six) hours as needed.  30 tablet  0   No current facility-administered medications for this visit.    SURGICAL HISTORY:  Past Surgical History  Procedure Laterality Date  . Prostate surgery  1996    TURP  . Knee arthroscopy Right 01/2007  . Pilonidal cyst excision  1960's  . Eye surgery      cataracts removed fr. both eyes, IOL in place   . Video assisted thoracoscopy (vats)/wedge resection Right 02/21/2013    Procedure: Right VIDEO ASSISTED THORACOSCOPY ,Thoracotomy with right upper lobectomy, node sampling ;  Surgeon: Delight Ovens, MD;  Location: [facility] OR;  Service: Thoracic;  Laterality: Right;  . Video bronchoscopy N/A 02/21/2013    Procedure: VIDEO BRONCHOSCOPY;  Surgeon: Delight Ovens, MD;  Location: [facility] OR;  Service: Thoracic;  Laterality: N/A;  . Cystoscopy N/A 02/21/2013    Procedure: CYSTOSCOPY FLEXIBLE with insertion of foley catheter;  Surgeon: Antony Haste, MD;  Location: [facility] OR;  Service: Urology;  Laterality: N/A;    REVIEW OF SYSTEMS:  A comprehensive review of systems was negative.   PHYSICAL EXAMINATION: General appearance: alert, cooperative and no distress Head: Normocephalic, without obvious abnormality, atraumatic Neck: no adenopathy Lymph nodes: Cervical, supraclavicular, and axillary nodes normal. Resp: clear to auscultation bilaterally Cardio: regular rate and rhythm, S1, S2 normal, no murmur, click, rub or gallop GI: soft, non-tender; bowel sounds normal; no masses,  no organomegaly Extremities: extremities normal, atraumatic, no cyanosis or edema  ECOG PERFORMANCE STATUS: 1 - Symptomatic but completely ambulatory  Blood pressure 137/74, pulse 93, temperature 97.7 F (36.5 C), temperature source Oral, resp. rate 18, height 5\' 10"  (1.778 m), weight 185 lb 3.2 oz (84.006 kg), SpO2 97.00%.  LABORATORY DATA: Lab Results    Component Value Date   WBC 6.8 05/08/2013   HGB 15.3 05/08/2013   HCT 43.6 05/08/2013   MCV 90.6 05/08/2013   PLT 200 05/08/2013      Chemistry      Component Value Date/Time   NA 140 05/01/2013 1335   NA 138 02/23/2013 0419   K 4.3 05/01/2013 1335   K 3.9 02/23/2013 0419   CL 107 04/24/2013 1110   CL 104 02/23/2013 0419   CO2 24 05/01/2013 1335   CO2 28 02/23/2013 0419   BUN 14.6 05/01/2013 1335   BUN 9 02/23/2013 0419   CREATININE 1.0 05/01/2013 1335   CREATININE 0.89 02/23/2013 0419  Component Value Date/Time   CALCIUM 9.6 05/01/2013 1335   CALCIUM 8.8 02/23/2013 0419   ALKPHOS 56 05/01/2013 1335   ALKPHOS 43 02/23/2013 0419   AST 29 05/01/2013 1335   AST 17 02/23/2013 0419   ALT 49 05/01/2013 1335   ALT 12 02/23/2013 0419   BILITOT 1.15 05/01/2013 1335   BILITOT 1.2 02/23/2013 0419       RADIOGRAPHIC STUDIES: No results found.  ASSESSMENT AND PLAN: This is a very pleasant 78 years old white male with a stage IIIa non-small cell lung cancer status post right upper lobectomy and currently undergoing adjuvant chemotherapy with carboplatin and Alimta status post 2 cycles. The patient is tolerating his treatment fairly well. We'll proceed with cycle #3 today as scheduled. I will make a referral for him to see a psychologist to address his issue with his children.  He would come back for followup visit in 3 weeks with the next cycle of chemotherapy.  All questions were answered. The patient knows to call the clinic with any problems, questions or concerns. We can certainly see the patient much sooner if necessary.

## 2013-05-10 ENCOUNTER — Other Ambulatory Visit: Payer: Self-pay | Admitting: Internal Medicine

## 2013-05-10 ENCOUNTER — Ambulatory Visit (INDEPENDENT_AMBULATORY_CARE_PROVIDER_SITE_OTHER): Payer: Medicare Other | Admitting: Psychiatry

## 2013-05-10 DIAGNOSIS — F0630 Mood disorder due to known physiological condition, unspecified: Secondary | ICD-10-CM

## 2013-05-10 DIAGNOSIS — Z638 Other specified problems related to primary support group: Secondary | ICD-10-CM

## 2013-05-15 ENCOUNTER — Other Ambulatory Visit (HOSPITAL_BASED_OUTPATIENT_CLINIC_OR_DEPARTMENT_OTHER): Payer: Medicare Other

## 2013-05-15 DIAGNOSIS — C3490 Malignant neoplasm of unspecified part of unspecified bronchus or lung: Secondary | ICD-10-CM

## 2013-05-15 LAB — CBC WITH DIFFERENTIAL/PLATELET
Basophils Absolute: 0.1 10*3/uL (ref 0.0–0.1)
EOS%: 1.3 % (ref 0.0–7.0)
Eosinophils Absolute: 0.1 10*3/uL (ref 0.0–0.5)
HGB: 13.8 g/dL (ref 13.0–17.1)
LYMPH%: 49.4 % — ABNORMAL HIGH (ref 14.0–49.0)
MCH: 31.6 pg (ref 27.2–33.4)
MCV: 90.1 fL (ref 79.3–98.0)
MONO%: 9.3 % (ref 0.0–14.0)
NEUT#: 2 10*3/uL (ref 1.5–6.5)
Platelets: 137 10*3/uL — ABNORMAL LOW (ref 140–400)

## 2013-05-15 LAB — COMPREHENSIVE METABOLIC PANEL (CC13)
AST: 21 U/L (ref 5–34)
Albumin: 3.9 g/dL (ref 3.5–5.0)
Alkaline Phosphatase: 60 U/L (ref 40–150)
BUN: 15.7 mg/dL (ref 7.0–26.0)
Creatinine: 0.9 mg/dL (ref 0.7–1.3)
Glucose: 91 mg/dl (ref 70–140)
Total Bilirubin: 1.05 mg/dL (ref 0.20–1.20)

## 2013-05-17 ENCOUNTER — Ambulatory Visit (INDEPENDENT_AMBULATORY_CARE_PROVIDER_SITE_OTHER): Payer: Medicare Other | Admitting: Psychiatry

## 2013-05-17 DIAGNOSIS — F0630 Mood disorder due to known physiological condition, unspecified: Secondary | ICD-10-CM

## 2013-05-17 DIAGNOSIS — Z638 Other specified problems related to primary support group: Secondary | ICD-10-CM

## 2013-05-22 ENCOUNTER — Other Ambulatory Visit (HOSPITAL_BASED_OUTPATIENT_CLINIC_OR_DEPARTMENT_OTHER): Payer: Medicare Other

## 2013-05-22 DIAGNOSIS — C3490 Malignant neoplasm of unspecified part of unspecified bronchus or lung: Secondary | ICD-10-CM

## 2013-05-22 LAB — CBC WITH DIFFERENTIAL/PLATELET
Basophils Absolute: 0 10*3/uL (ref 0.0–0.1)
Eosinophils Absolute: 0.1 10*3/uL (ref 0.0–0.5)
HCT: 42.3 % (ref 38.4–49.9)
HGB: 14.7 g/dL (ref 13.0–17.1)
MCV: 92.1 fL (ref 79.3–98.0)
MONO%: 12.3 % (ref 0.0–14.0)
NEUT#: 3.3 10*3/uL (ref 1.5–6.5)
NEUT%: 51.4 % (ref 39.0–75.0)
Platelets: 160 10*3/uL (ref 140–400)
RDW: 16.2 % — ABNORMAL HIGH (ref 11.0–14.6)

## 2013-05-22 LAB — COMPREHENSIVE METABOLIC PANEL (CC13)
ALT: 46 U/L (ref 0–55)
Albumin: 4 g/dL (ref 3.5–5.0)
Alkaline Phosphatase: 62 U/L (ref 40–150)
CO2: 25 mEq/L (ref 22–29)
Glucose: 88 mg/dl (ref 70–140)
Potassium: 4.2 mEq/L (ref 3.5–5.1)
Sodium: 139 mEq/L (ref 136–145)
Total Protein: 6.9 g/dL (ref 6.4–8.3)

## 2013-05-29 ENCOUNTER — Ambulatory Visit (HOSPITAL_BASED_OUTPATIENT_CLINIC_OR_DEPARTMENT_OTHER): Payer: Medicare Other | Admitting: Internal Medicine

## 2013-05-29 ENCOUNTER — Ambulatory Visit (HOSPITAL_BASED_OUTPATIENT_CLINIC_OR_DEPARTMENT_OTHER): Payer: Medicare Other

## 2013-05-29 ENCOUNTER — Other Ambulatory Visit (HOSPITAL_BASED_OUTPATIENT_CLINIC_OR_DEPARTMENT_OTHER): Payer: Medicare Other | Admitting: Lab

## 2013-05-29 ENCOUNTER — Telehealth: Payer: Self-pay | Admitting: Internal Medicine

## 2013-05-29 ENCOUNTER — Encounter: Payer: Self-pay | Admitting: Internal Medicine

## 2013-05-29 ENCOUNTER — Ambulatory Visit: Payer: Medicare Other | Admitting: Internal Medicine

## 2013-05-29 DIAGNOSIS — C3410 Malignant neoplasm of upper lobe, unspecified bronchus or lung: Secondary | ICD-10-CM

## 2013-05-29 DIAGNOSIS — Z5111 Encounter for antineoplastic chemotherapy: Secondary | ICD-10-CM

## 2013-05-29 LAB — COMPREHENSIVE METABOLIC PANEL (CC13)
ALT: 31 U/L (ref 0–55)
AST: 20 U/L (ref 5–34)
Albumin: 3.8 g/dL (ref 3.5–5.0)
Alkaline Phosphatase: 57 U/L (ref 40–150)
Glucose: 138 mg/dl (ref 70–140)
Potassium: 4.1 mEq/L (ref 3.5–5.1)
Sodium: 142 mEq/L (ref 136–145)
Total Protein: 6.6 g/dL (ref 6.4–8.3)

## 2013-05-29 LAB — CBC WITH DIFFERENTIAL/PLATELET
BASO%: 0.6 % (ref 0.0–2.0)
EOS%: 0.8 % (ref 0.0–7.0)
Eosinophils Absolute: 0.1 10*3/uL (ref 0.0–0.5)
MCHC: 34.8 g/dL (ref 32.0–36.0)
MCV: 92.9 fL (ref 79.3–98.0)
MONO%: 11.9 % (ref 0.0–14.0)
NEUT#: 2.6 10*3/uL (ref 1.5–6.5)
RBC: 4.53 10*6/uL (ref 4.20–5.82)
RDW: 17 % — ABNORMAL HIGH (ref 11.0–14.6)

## 2013-05-29 MED ORDER — SODIUM CHLORIDE 0.9 % IV SOLN
532.5000 mg | Freq: Once | INTRAVENOUS | Status: AC
  Administered 2013-05-29: 530 mg via INTRAVENOUS
  Filled 2013-05-29: qty 53

## 2013-05-29 MED ORDER — SODIUM CHLORIDE 0.9 % IV SOLN
500.0000 mg/m2 | Freq: Once | INTRAVENOUS | Status: AC
  Administered 2013-05-29: 1000 mg via INTRAVENOUS
  Filled 2013-05-29: qty 40

## 2013-05-29 MED ORDER — ONDANSETRON 16 MG/50ML IVPB (CHCC)
16.0000 mg | Freq: Once | INTRAVENOUS | Status: AC
  Administered 2013-05-29: 16 mg via INTRAVENOUS

## 2013-05-29 MED ORDER — SODIUM CHLORIDE 0.9 % IV SOLN
Freq: Once | INTRAVENOUS | Status: AC
  Administered 2013-05-29: 11:00:00 via INTRAVENOUS

## 2013-05-29 NOTE — Telephone Encounter (Signed)
gave pt appt for lab and MD on Augustr 2014

## 2013-05-29 NOTE — Progress Notes (Addendum)
[facility] [phone]   Fax:([phone]  OFFICE PROGRESS NOTE  Alvin Kayser, MD [address] [place] [address]  DIAGNOSIS AND STAGE: Stage III A (T2 A., N2, M0) non-small cell lung cancer, adenocarcinoma with positive EGFR mutation in exon 19 and negative ALK gene translocation diagnosed in April 2014   PRIOR THERAPY: Status post right upper lobectomy with lymph node dissection under the care of Dr. Tyrone Sage on 02/21/2013   CURRENT THERAPY: Systemic adjuvant chemotherapy with carboplatin for AUC of 5 and Alimta 500 mg/M2 every 3 weeks. Status post 3 cycles. (Carboplatin was used in place of cisplatin secondary to concern about intolerability and significant adverse effects of cisplatin).  CHEMOTHERAPY INTENT: adjuvant/curative  CURRENT # OF CHEMOTHERAPY CYCLES: 4  CURRENT ANTIEMETICS: Zofran, Decadron and Compazine  CURRENT SMOKING STATUS: currently a nonsmoker  ORAL CHEMOTHERAPY AND CONSENT: None  CURRENT BISPHOSPHONATES USE: None  PAIN MANAGEMENT: no pain  NARCOTICS INDUCED CONSTIPATION: N/A  LIVING WILL AND CODE STATUS: Full Code.  INTERVAL HISTORY: Alvin Hardy 78 y.o. male returns to the clinic today for follow up visit accompanied his wife. The patient is feeling fine today with no specific complaints except for mild fatigue. He has few episodes of nausea that was resolved with Compazine. The patient denied having any significant chest pain, shortness breath, cough or hemoptysis. He denied having any weight loss or night sweats. He has no fever or chills. He is here today to start cycle #4 of his chemotherapy.  MEDICAL HISTORY: Past Medical History  Diagnosis Date  . BPH (benign prostatic hyperplasia)   . Hypogonadism male   . Bipolar 1 disorder   . ED (erectile dysfunction)   . Hyperlipidemia   . Neuromuscular disorder     peripheral neuropathy  . Cataract 2010    Bilateral  . Laryngopharyngeal reflux   . Gilbert's  syndrome   . Deviated septum     TO THE LEFT  . Morton's neuroma     LEFT FOOT  . Lung mass 02/07/13    RIGHT UPPER LOBE  . Shortness of breath     voice breaks, ? related to reflux, although told by Dr. Christella Hartigan, S.- early 48 yr. old  . GERD (gastroesophageal reflux disease)   . Cancer     lung  . Arthritis     psoriatic arthritis, ? , treated /w mmethotrexate   . Finger injury     mallet finger-- 02/13/2013, cast in place, followed by dr. Merlyn Lot    ALLERGIES:  is allergic to codeine; decadron; other; and zoloft.  MEDICATIONS:  Current Outpatient Prescriptions  Medication Sig Dispense Refill  . calcipotriene (DOVONOX) 0.005 % cream Apply 1 application topically daily as needed (for hands).       . Calcium Carbonate (CALCIUM 600 PO) Take 1 tablet by mouth daily.      . Cholecalciferol (VITAMIN D-3) 1000 UNITS CAPS Take 1,000 Units by mouth daily.       . clobetasol cream (TEMOVATE) 0.05 % Apply 1 application topically daily.       . clonazePAM (KLONOPIN) 0.5 MG tablet Take 0.5 mg by mouth at bedtime.      . colesevelam (WELCHOL) 625 MG tablet Take 3,750 mg by mouth daily with supper.      . cyanocobalamin 1000 MCG tablet Take 1,000 mcg by mouth daily.      Marland Kitchen esomeprazole (NEXIUM) 40 MG capsule Take 40 mg by mouth daily before breakfast.      .  finasteride (PROSCAR) 5 MG tablet Take 5 mg by mouth daily before breakfast.       . folic acid (FOLVITE) 1 MG tablet TAKE 1 TABLET BY MOUTH DAILY  30 tablet  0  . gabapentin (NEURONTIN) 300 MG capsule Take 600-900 mg by mouth 2 (two) times daily. Takes 2 capsules every morning, 300mg  x3 at night      . glucosamine-chondroitin 500-400 MG tablet Take 1 tablet by mouth 2 (two) times daily.       Marland Kitchen lithium carbonate (LITHOBID) 300 MG CR tablet Take 300 mg by mouth at bedtime.      . Multiple Vitamins-Minerals (MULTIVITAMIN PO) Take 2 tablets by mouth daily.       . prochlorperazine (COMPAZINE) 10 MG tablet Take 1 tablet (10 mg total) by mouth every  6 (six) hours as needed.  30 tablet  0  . propranolol (INDERAL) 10 MG tablet Take 10-20 mg by mouth daily as needed (Takes rarely, only under extremely stressful circumstances.).       Marland Kitchen testosterone cypionate (DEPOTESTOTERONE CYPIONATE) 100 MG/ML injection Inject 100 mg into the muscle every Friday. For IM use only      . vardenafil (LEVITRA) 5 MG tablet Take 5 mg by mouth daily as needed for erectile dysfunction.       Marland Kitchen aspirin EC 81 MG tablet Take 81 mg by mouth daily.       No current facility-administered medications for this visit.    SURGICAL HISTORY:  Past Surgical History  Procedure Laterality Date  . Prostate surgery  1996    TURP  . Knee arthroscopy Right 01/2007  . Pilonidal cyst excision  1960's  . Eye surgery      cataracts removed fr. both eyes, IOL in place   . Video assisted thoracoscopy (vats)/wedge resection Right 02/21/2013    Procedure: Right VIDEO ASSISTED THORACOSCOPY ,Thoracotomy with right upper lobectomy, node sampling ;  Surgeon: Delight Ovens, MD;  Location: [facility] OR;  Service: Thoracic;  Laterality: Right;  . Video bronchoscopy N/A 02/21/2013    Procedure: VIDEO BRONCHOSCOPY;  Surgeon: Delight Ovens, MD;  Location: [facility] OR;  Service: Thoracic;  Laterality: N/A;  . Cystoscopy N/A 02/21/2013    Procedure: CYSTOSCOPY FLEXIBLE with insertion of foley catheter;  Surgeon: Antony Haste, MD;  Location: [facility] OR;  Service: Urology;  Laterality: N/A;    REVIEW OF SYSTEMS:  A comprehensive review of systems was negative.   PHYSICAL EXAMINATION: General appearance: alert, cooperative and no distress Head: Normocephalic, without obvious abnormality, atraumatic Neck: no adenopathy Lymph nodes: Cervical, supraclavicular, and axillary nodes normal. Resp: clear to auscultation bilaterally Cardio: regular rate and rhythm, S1, S2 normal, no murmur, click, rub or gallop GI: soft, non-tender; bowel sounds normal; no masses,  no organomegaly Extremities: extremities  normal, atraumatic, no cyanosis or edema  ECOG PERFORMANCE STATUS: 1 - Symptomatic but completely ambulatory  Blood pressure 124/71, pulse 94, temperature 98.7 F (37.1 C), temperature source Oral, resp. rate 20, height 5\' 10"  (1.778 m), weight 185 lb 11.2 oz (84.233 kg).  LABORATORY DATA: Lab Results  Component Value Date   WBC 6.0 05/29/2013   HGB 14.6 05/29/2013   HCT 42.1 05/29/2013   MCV 92.9 05/29/2013   PLT 200 05/29/2013      Chemistry      Component Value Date/Time   NA 142 05/29/2013 0930   NA 138 02/23/2013 0419   K 4.1 05/29/2013 0930   K 3.9 02/23/2013 0419  CL 107 04/24/2013 1110   CL 104 02/23/2013 0419   CO2 23 05/29/2013 0930   CO2 28 02/23/2013 0419   BUN 13.1 05/29/2013 0930   BUN 9 02/23/2013 0419   CREATININE 0.9 05/29/2013 0930   CREATININE 0.89 02/23/2013 0419      Component Value Date/Time   CALCIUM 9.2 05/29/2013 0930   CALCIUM 8.8 02/23/2013 0419   ALKPHOS 57 05/29/2013 0930   ALKPHOS 43 02/23/2013 0419   AST 20 05/29/2013 0930   AST 17 02/23/2013 0419   ALT 31 05/29/2013 0930   ALT 12 02/23/2013 0419   BILITOT 0.84 05/29/2013 0930   BILITOT 1.2 02/23/2013 0419       RADIOGRAPHIC STUDIES: No results found.  ASSESSMENT AND PLAN: this is a very pleasant 78 years old white male recently diagnosed with a stage IIIA non-small cell lung cancer status post right upper lobectomy and currently undergoing adjuvant chemotherapy with carboplatin and Alimta status post 3 cycles. The patient is tolerating his treatment fairly well with no significant adverse effects. We'll proceed with cycle #4 today as scheduled. The patient would come back for follow up visit in one month with repeat CT scan of the chest for restaging of his disease. I would consider referring the patient to Dr. Mitzi Hansen for consideration of the adjuvant radiotherapy to the mediastinum at that time.  The patient voices understanding of current disease status and treatment options and is in agreement with the  current care plan.  All questions were answered. The patient knows to call the clinic with any problems, questions or concerns. We can certainly see the patient much sooner if necessary.

## 2013-05-29 NOTE — Patient Instructions (Addendum)
Cancer Center Discharge Instructions for Patients Receiving Chemotherapy  Today you received the following chemotherapy agents: alimta, carboplatin   To help prevent nausea and vomiting after your treatment, we encourage you to take your nausea medication.  Take it as often as prescribed.     If you develop nausea and vomiting that is not controlled by your nausea medication, call the clinic. If it is after clinic hours your family physician or the after hours number for the clinic or go to the Emergency Department.   BELOW ARE SYMPTOMS THAT SHOULD BE REPORTED IMMEDIATELY:  *FEVER GREATER THAN 100.5 F  *CHILLS WITH OR WITHOUT FEVER  NAUSEA AND VOMITING THAT IS NOT CONTROLLED WITH YOUR NAUSEA MEDICATION  *UNUSUAL SHORTNESS OF BREATH  *UNUSUAL BRUISING OR BLEEDING  TENDERNESS IN MOUTH AND THROAT WITH OR WITHOUT PRESENCE OF ULCERS  *URINARY PROBLEMS  *BOWEL PROBLEMS  UNUSUAL RASH Items with * indicate a potential emergency and should be followed up as soon as possible.  Feel free to call the clinic you have any questions or concerns. The clinic phone number is [phone].   I have been informed and understand all the instructions given to me. I know to contact the clinic, my physician, or go to the Emergency Department if any problems should occur. I do not have any questions at this time, but understand that I may call the clinic during office hours   should I have any questions or need assistance in obtaining follow up care.    __________________________________________  _____________  __________ Signature of Patient or Authorized Representative            Date                   Time    __________________________________________ Nurse's Signature    

## 2013-05-29 NOTE — Patient Instructions (Signed)
Continue chemotherapy with cycle #4 today.  Follow up visit in one month with repeat CT scan of the chest.

## 2013-06-06 ENCOUNTER — Other Ambulatory Visit: Payer: Self-pay

## 2013-06-12 ENCOUNTER — Other Ambulatory Visit: Payer: Self-pay | Admitting: *Deleted

## 2013-06-12 ENCOUNTER — Ambulatory Visit (INDEPENDENT_AMBULATORY_CARE_PROVIDER_SITE_OTHER): Payer: Medicare Other | Admitting: Psychiatry

## 2013-06-12 DIAGNOSIS — F0630 Mood disorder due to known physiological condition, unspecified: Secondary | ICD-10-CM

## 2013-06-12 DIAGNOSIS — Z638 Other specified problems related to primary support group: Secondary | ICD-10-CM

## 2013-06-12 MED ORDER — FOLIC ACID 1 MG PO TABS: ORAL_TABLET | ORAL | Status: DC

## 2013-06-14 ENCOUNTER — Encounter: Payer: Self-pay | Admitting: Internal Medicine

## 2013-06-15 ENCOUNTER — Telehealth: Payer: Self-pay | Admitting: *Deleted

## 2013-06-15 NOTE — Telephone Encounter (Signed)
INSTRUCTED PT. TO HAVE DR.PERINI'S OFFICE FAX PT.'S IMMUNIZATION RECORD TO THIS OFFICE SO IT CAN BE ENTERED INTO THIS SYSTEM. INFORMED PT THAT DR.MOHAMED WILL DISCUSS THE RESULTS OF THE CT SCAN AND THE NEXT STEPS IN PT.'S PLAN OF CARE AT THE 06/28/13 APPOINTMENT. PT. VOICES UNDERSTANDING.

## 2013-06-21 ENCOUNTER — Ambulatory Visit: Payer: Medicare Other | Admitting: Cardiothoracic Surgery

## 2013-06-27 ENCOUNTER — Ambulatory Visit (HOSPITAL_COMMUNITY)
Admission: RE | Admit: 2013-06-27 | Discharge: 2013-06-27 | Disposition: A | Discharge status: Home / Self Care | Payer: Federal, State, Local not specified - PPO | Source: Ambulatory Visit | Attending: Internal Medicine | Admitting: Internal Medicine

## 2013-06-27 ENCOUNTER — Encounter: Payer: Self-pay | Admitting: Specialist

## 2013-06-27 ENCOUNTER — Other Ambulatory Visit (HOSPITAL_BASED_OUTPATIENT_CLINIC_OR_DEPARTMENT_OTHER): Payer: Medicare Other | Admitting: Lab

## 2013-06-27 DIAGNOSIS — M4120 Other idiopathic scoliosis, site unspecified: Secondary | ICD-10-CM | POA: Insufficient documentation

## 2013-06-27 DIAGNOSIS — C3410 Malignant neoplasm of upper lobe, unspecified bronchus or lung: Secondary | ICD-10-CM | POA: Insufficient documentation

## 2013-06-27 DIAGNOSIS — M479 Spondylosis, unspecified: Secondary | ICD-10-CM | POA: Insufficient documentation

## 2013-06-27 DIAGNOSIS — R911 Solitary pulmonary nodule: Secondary | ICD-10-CM | POA: Insufficient documentation

## 2013-06-27 DIAGNOSIS — N281 Cyst of kidney, acquired: Secondary | ICD-10-CM | POA: Insufficient documentation

## 2013-06-27 LAB — CBC WITH DIFFERENTIAL/PLATELET
Basophils Absolute: 0 10*3/uL (ref 0.0–0.1)
Eosinophils Absolute: 0.1 10*3/uL (ref 0.0–0.5)
HGB: 14.7 g/dL (ref 13.0–17.1)
LYMPH%: 38.6 % (ref 14.0–49.0)
MCV: 92.1 fL (ref 79.3–98.0)
MONO#: 0.7 10*3/uL (ref 0.1–0.9)
MONO%: 9.8 % (ref 0.0–14.0)
NEUT#: 3.3 10*3/uL (ref 1.5–6.5)
Platelets: 157 10*3/uL (ref 140–400)
RBC: 4.56 10*6/uL (ref 4.20–5.82)
RDW: 15.9 % — ABNORMAL HIGH (ref 11.0–14.6)
WBC: 6.6 10*3/uL (ref 4.0–10.3)

## 2013-06-27 LAB — COMPREHENSIVE METABOLIC PANEL (CC13)
Albumin: 3.9 g/dL (ref 3.5–5.0)
Alkaline Phosphatase: 59 U/L (ref 40–150)
BUN: 12.4 mg/dL (ref 7.0–26.0)
CO2: 23 mEq/L (ref 22–29)
Glucose: 93 mg/dl (ref 70–140)
Potassium: 4.5 mEq/L (ref 3.5–5.1)
Sodium: 142 mEq/L (ref 136–145)
Total Protein: 6.9 g/dL (ref 6.4–8.3)

## 2013-06-27 MED ORDER — IOHEXOL 300 MG/ML  SOLN
80.0000 mL | Freq: Once | INTRAMUSCULAR | Status: AC | PRN
  Administered 2013-06-27: 80 mL via INTRAVENOUS

## 2013-06-27 NOTE — Progress Notes (Signed)
Alvin Hardy came by my office today prior to having a scan done.  He indicated he will get the results of the scan tomorrow and is very optimistic about the results.  One of the things that has sustained him during his treatment is his creative side..he has continued to compose and record music.  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  He seemed just to want to talk.

## 2013-06-28 ENCOUNTER — Telehealth: Payer: Self-pay | Admitting: Internal Medicine

## 2013-06-28 ENCOUNTER — Ambulatory Visit: Payer: Medicare Other | Admitting: Internal Medicine

## 2013-06-28 NOTE — Telephone Encounter (Signed)
Alvin Hardy and advised that Dr. Kerry Fort was sick...r/s to 9.3.14.Marland KitchenMarland Kitchenpt ok and aware

## 2013-07-04 ENCOUNTER — Telehealth: Payer: Self-pay | Admitting: Internal Medicine

## 2013-07-04 ENCOUNTER — Encounter: Payer: Self-pay | Admitting: Internal Medicine

## 2013-07-04 ENCOUNTER — Ambulatory Visit (HOSPITAL_BASED_OUTPATIENT_CLINIC_OR_DEPARTMENT_OTHER): Payer: Medicare Other | Admitting: Internal Medicine

## 2013-07-04 DIAGNOSIS — C3410 Malignant neoplasm of upper lobe, unspecified bronchus or lung: Secondary | ICD-10-CM

## 2013-07-04 DIAGNOSIS — C771 Secondary and unspecified malignant neoplasm of intrathoracic lymph nodes: Secondary | ICD-10-CM

## 2013-07-04 NOTE — Progress Notes (Signed)
[facility] [phone]   Fax:([phone]) [phone]  OFFICE PROGRESS NOTE  [name], MD [address] [place] [address]  DIAGNOSIS AND STAGE: Stage III A (T2 A., N2, M0) non-small cell lung cancer, adenocarcinoma with positive EGFR mutation in exon 19 and negative ALK gene translocation diagnosed in April 2014   PRIOR THERAPY:  1) Status post right upper lobectomy with lymph node dissection under the care of Dr. Tyrone Sage on 02/21/2013. 2) Systemic adjuvant chemotherapy with carboplatin for AUC of 5 and Alimta 500 mg/M2 every 3 weeks. Status post 4 cycles. Last cycle was given on 05/29/2013. (Carboplatin was used in place of cisplatin secondary to concern about intolerability and significant adverse effects of cisplatin).   CURRENT THERAPY: Observation  CHEMOTHERAPY INTENT: adjuvant/curative  CURRENT # OF CHEMOTHERAPY CYCLES: 4  CURRENT ANTIEMETICS: Zofran, Decadron and Compazine  CURRENT SMOKING STATUS: currently a nonsmoker  ORAL CHEMOTHERAPY AND CONSENT: None  CURRENT BISPHOSPHONATES USE: None  PAIN MANAGEMENT: no pain  NARCOTICS INDUCED CONSTIPATION: N/A  LIVING WILL AND CODE STATUS: Full Code.   INTERVAL HISTORY: Alvin Hardy 78 y.o. male returns to the clinic today for followup visit accompanied by his wife. The patient is feeling fine today with no specific complaints. He start gaining more energy over the last few weeks. The patient denied having any significant fever or chills. He has no nausea or vomiting. He denied having any significant chest pain, shortness breath, cough or hemoptysis. The patient had repeat CT scan of the chest performed recently and he is here for evaluation and discussion of his scan results.  MEDICAL HISTORY: Past Medical History  Diagnosis Date  . BPH (benign prostatic hyperplasia)   . Hypogonadism male   . Bipolar 1 disorder   . ED (erectile dysfunction)   . Hyperlipidemia   . Neuromuscular disorder    peripheral neuropathy  . Cataract 2010    Bilateral  . Laryngopharyngeal reflux   . Gilbert's syndrome   . Deviated septum     TO THE LEFT  . Morton's neuroma     LEFT FOOT  . Lung mass 02/07/13    RIGHT UPPER LOBE  . Shortness of breath     voice breaks, ? related to reflux, although told by Dr. Christella Hartigan, S.- early 71 yr. old  . GERD (gastroesophageal reflux disease)   . Cancer     lung  . Arthritis     psoriatic arthritis, ? , treated /w mmethotrexate   . Finger injury     mallet finger-- 02/13/2013, cast in place, followed by dr. Merlyn Lot    ALLERGIES:  is allergic to codeine; decadron; other; and zoloft.  MEDICATIONS:  Current Outpatient Prescriptions  Medication Sig Dispense Refill  . aspirin EC 81 MG tablet Take 81 mg by mouth daily.      . calcipotriene (DOVONOX) 0.005 % cream Apply 1 application topically daily as needed (for hands).       . Calcium Carbonate (CALCIUM 600 PO) Take 1 tablet by mouth daily.      . Cholecalciferol (VITAMIN D-3) 1000 UNITS CAPS Take 1,000 Units by mouth daily.       . clobetasol cream (TEMOVATE) 0.05 % Apply 1 application topically daily.       . clonazePAM (KLONOPIN) 0.5 MG tablet Take 0.5 mg by mouth at bedtime.      . colesevelam (WELCHOL) 625 MG tablet Take 3,750 mg by mouth daily with supper.      Marland Kitchen  cyanocobalamin 1000 MCG tablet Take 1,000 mcg by mouth daily.      Marland Kitchen esomeprazole (NEXIUM) 40 MG capsule Take 40 mg by mouth daily before breakfast.      . finasteride (PROSCAR) 5 MG tablet Take 5 mg by mouth daily before breakfast.       . folic acid (FOLVITE) 1 MG tablet TAKE 1 TABLET BY MOUTH DAILY  30 tablet  0  . gabapentin (NEURONTIN) 300 MG capsule Take 600-900 mg by mouth 2 (two) times daily. Takes 2 capsules every morning, 300mg  x3 at night      . glucosamine-chondroitin 500-400 MG tablet Take 1 tablet by mouth 2 (two) times daily.       Marland Kitchen lithium carbonate (LITHOBID) 300 MG CR tablet Take 300 mg by mouth at bedtime.      . Multiple  Vitamins-Minerals (MULTIVITAMIN PO) Take 2 tablets by mouth daily.       . prochlorperazine (COMPAZINE) 10 MG tablet Take 1 tablet (10 mg total) by mouth every 6 (six) hours as needed.  30 tablet  0  . propranolol (INDERAL) 10 MG tablet Take 10-20 mg by mouth daily as needed (Takes rarely, only under extremely stressful circumstances.).       Marland Kitchen testosterone cypionate (DEPOTESTOTERONE CYPIONATE) 100 MG/ML injection Inject 100 mg into the muscle every Friday. For IM use only      . vardenafil (LEVITRA) 5 MG tablet Take 5 mg by mouth daily as needed for erectile dysfunction.        No current facility-administered medications for this visit.    SURGICAL HISTORY:  Past Surgical History  Procedure Laterality Date  . Prostate surgery  1996    TURP  . Knee arthroscopy Right 01/2007  . Pilonidal cyst excision  1960's  . Eye surgery      cataracts removed fr. both eyes, IOL in place   . Video assisted thoracoscopy (vats)/wedge resection Right 02/21/2013    Procedure: Right VIDEO ASSISTED THORACOSCOPY ,Thoracotomy with right upper lobectomy, node sampling ;  Surgeon: Delight Ovens, MD;  Location: [facility] OR;  Service: Thoracic;  Laterality: Right;  . Video bronchoscopy N/A 02/21/2013    Procedure: VIDEO BRONCHOSCOPY;  Surgeon: Delight Ovens, MD;  Location: [facility] OR;  Service: Thoracic;  Laterality: N/A;  . Cystoscopy N/A 02/21/2013    Procedure: CYSTOSCOPY FLEXIBLE with insertion of foley catheter;  Surgeon: Antony Haste, MD;  Location[facility] OR;  Service: Urology;  Laterality: N/A;    REVIEW OF SYSTEMS:  A comprehensive review of systems was negative.   PHYSICAL EXAMINATION: General appearance: alert, cooperative and no distress Head: Normocephalic, without obvious abnormality, atraumatic Neck: no adenopathy Lymph nodes: Cervical, supraclavicular, and axillary nodes normal. Resp: clear to auscultation bilaterally Cardio: regular rate and rhythm, S1, S2 normal, no murmur, click, rub or  gallop GI: soft, non-tender; bowel sounds normal; no masses,  no organomegaly Extremities: extremities normal, atraumatic, no cyanosis or edema Neurologic: Alert and oriented X 3, normal strength and tone. Normal symmetric reflexes. Normal coordination and gait  ECOG PERFORMANCE STATUS: 0 - Asymptomatic  Blood pressure 129/69, pulse 93, temperature 98.2 F (36.8 C), temperature source Oral, resp. rate 18, height 5\' 10"  (1.778 m), weight 186 lb 4.8 oz (84.505 kg), SpO2 98.00%.  LABORATORY DATA: Lab Results  Component Value Date   WBC 6.6 06/27/2013   HGB 14.7 06/27/2013   HCT 42.0 06/27/2013   MCV 92.1 06/27/2013   PLT 157 06/27/2013      Chemistry  Component Value Date/Time   NA 142 06/27/2013 1108   NA 138 02/23/2013 0419   K 4.5 06/27/2013 1108   K 3.9 02/23/2013 0419   CL 107 04/24/2013 1110   CL 104 02/23/2013 0419   CO2 23 06/27/2013 1108   CO2 28 02/23/2013 0419   BUN 12.4 06/27/2013 1108   BUN 9 02/23/2013 0419   CREATININE 0.9 06/27/2013 1108   CREATININE 0.89 02/23/2013 0419      Component Value Date/Time   CALCIUM 9.1 06/27/2013 1108   CALCIUM 8.8 02/23/2013 0419   ALKPHOS 59 06/27/2013 1108   ALKPHOS 43 02/23/2013 0419   AST 32 06/27/2013 1108   AST 17 02/23/2013 0419   ALT 47 06/27/2013 1108   ALT 12 02/23/2013 0419   BILITOT 1.28* 06/27/2013 1108   BILITOT 1.2 02/23/2013 0419       RADIOGRAPHIC STUDIES: Ct Chest W Contrast  06/27/2013   *RADIOLOGY REPORT*  Clinical Data: Staging lung cancer  CT CHEST WITH CONTRAST  Technique:  Multidetector CT imaging of the chest was performed following the standard protocol during bolus administration of intravenous contrast.  Contrast: 80mL OMNIPAQUE IOHEXOL 300 MG/ML  SOLN  Comparison: 02/14/2013  Findings: No pleural effusion identified.  The patient is status post right upper lobectomy. There is a new sub solid density within the right lung base measuring 1.1 x 1.8 cm, image 41/series 6. This is new from the previous exam.  Within the  left lower lobe there is a 5 mm nodule, image 46/series 5.  Heart size appears normal.  There is no mediastinal or hilar adenopathy identified.  There is no enlarged axillary or supraclavicular adenopathy.  Limited imaging through the upper abdomen shows bilateral renal cysts.  No acute findings identified.  No mass or adenopathy.  Review of the visualized bony structures is significant for mild scoliosis and multilevel spondylosis.  IMPRESSION:  1.  No acute cardiopulmonary abnormalities. 2.  Postoperative changes from right upper lobectomy. 3.  New sub solid density within the right lower lobe is nonspecific and may be treatment related.  Recommend follow-up imaging in 3 months to confirm persistence.   Original Report Authenticated By: Signa Kell, M.D.    ASSESSMENT AND PLAN: This is a very pleasant 78 years old white male with history of stage IIIa non-small cell lung cancer, adenocarcinoma with positive EGFR mutation status post right upper lobectomy with lymph node dissection followed by 4 cycles of adjuvant chemotherapy. The patient is doing fine and he has no evidence for disease recurrence on the recent scan but there is questionable nodule in the right lower lobe that is nonspecific and the prior close followup in the upcoming scan. I discussed the scan results with the patient and his wife today. I recommended for him to continue on observation with repeat CT scan of the chest in 3 months. In the meantime I will refer the patient to Dr. Mitzi Hansen for evaluation and consideration of adjuvant radiotherapy to the mediastinum. The patient was advised to call immediately if he has any concerning symptoms in the interval.  The patient voices understanding of current disease status and treatment options and is in agreement with the current care plan.  All questions were answered. The patient knows to call the clinic with any problems, questions or concerns. We can certainly see the patient much sooner  if necessary.  I spent 15 minutes counseling the patient face to face. The total time spent in the appointment was 25 minutes.

## 2013-07-04 NOTE — Telephone Encounter (Signed)
s.w. pt and advised on Dr. Mitzi Hansen, and Dec appt with CT

## 2013-07-04 NOTE — Patient Instructions (Signed)
CURRENT THERAPY: Observation  CHEMOTHERAPY INTENT: adjuvant/curative  CURRENT # OF CHEMOTHERAPY CYCLES: 4  CURRENT ANTIEMETICS: Zofran, Decadron and Compazine  CURRENT SMOKING STATUS: currently a nonsmoker  ORAL CHEMOTHERAPY AND CONSENT: None  CURRENT BISPHOSPHONATES USE: None  PAIN MANAGEMENT: no pain  NARCOTICS INDUCED CONSTIPATION: N/A  LIVING WILL AND CODE STATUS: Full Code.

## 2013-07-05 ENCOUNTER — Ambulatory Visit (INDEPENDENT_AMBULATORY_CARE_PROVIDER_SITE_OTHER): Payer: Medicare Other | Admitting: Psychiatry

## 2013-07-05 DIAGNOSIS — Z638 Other specified problems related to primary support group: Secondary | ICD-10-CM

## 2013-07-05 DIAGNOSIS — F0630 Mood disorder due to known physiological condition, unspecified: Secondary | ICD-10-CM

## 2013-07-06 ENCOUNTER — Encounter: Payer: Self-pay | Admitting: Internal Medicine

## 2013-07-09 ENCOUNTER — Ambulatory Visit: Payer: Medicare Other | Admitting: Radiation Oncology

## 2013-07-09 ENCOUNTER — Ambulatory Visit: Payer: Medicare Other

## 2013-07-10 NOTE — Progress Notes (Addendum)
Thoracic Location of Tumor / Histology:right upper lung=NSCCA  Patient presented  months ago with symptoms of: 01/2013 congestion, "voice breaks"  Biopsies of  (if applicable) revealed:02/21/13: 1. Lung, resection (segmental or lobe), Right upper lobe- ADENOCARCINOMA, 3.4 CM.- MARGINS NOT INVOLVED. - VISCERAL PLEURA FREE OF TUMOR.- ONE BENIGN LYMPH NODE.2. Lymph node, biopsy, Right upper 4 R- METASTATIC ADENOCARCINOMA.3. Lymph node, biopsy, Right 2 R- ANTHRACOTIC LYMPH NODE. NO TUMOR IDENTIFIE  Tobacco/Marijuana/Snuff/ETOH ZOX:WRUEAV smoker,quit 34 years ago, cigarettes, drinks alcohol, no illicit drug use  Past/Anticipated interventions by cardiothoracic surgery, if any: 02/21/13: PROCEDURE: Procedure(s): RIGHT VIDEO ASSISTED THORACOSCOPY/ MINI THORACOTOMY -Right Upper Lobectomy -Lymph Node Sampling Station 2 and 4 Dr. Tyrone Sage   Past/Anticipated interventions by medical oncology, if any: 07/04/13: 2) Systemic adjuvant chemotherapy with carboplatin for AUC of 5 and Alimta 500 mg/M2 every 3 weeks. Status post 4 cycles. Last cycle was given on 05/29/2013. (Carboplatin was used in place of cisplatin secondary to concern about intolerability and significant adverse effects of cisplatin). CURRENT THERAPY: Observation CHEMOTHERAPY INTENT: adjuvant/curative CURRENT # OF CHEMOTHERAPY CYCLES: 4    Signs/Symptoms Weight changes, if any: back to regular weight  Respiratory complaints, if any: no  Hemoptysis, if any:no Pain issues, if any: no SAFETY ISSUES:  Prior radiation?No  Pacemaker/ICD? no    Is the patient on methotrexate? no  Current Complaints / other details: Married, 2 sons,  Melanoma on left  Back removed surgically 3 years ago  [facility],   retired, mother stomach cancer, deceased age 29, father heart disease, heavy smoker, deceased age 13, maternal aunt lung cancer, deceased,smoker,  Depression not to do with cancer, seeing Dr.Jeanne Noe Gens

## 2013-07-11 ENCOUNTER — Encounter: Payer: Self-pay | Admitting: Radiation Oncology

## 2013-07-11 ENCOUNTER — Ambulatory Visit
Admission: RE | Admit: 2013-07-11 | Discharge: 2013-07-11 | Disposition: A | Discharge status: Home / Self Care | Payer: Medicare Other | Source: Ambulatory Visit | Attending: Radiation Oncology | Admitting: Radiation Oncology

## 2013-07-11 ENCOUNTER — Ambulatory Visit
Admission: RE | Admit: 2013-07-11 | Discharge: 2013-07-11 | Disposition: A | Discharge status: Home / Self Care | Payer: Federal, State, Local not specified - PPO | Source: Ambulatory Visit | Attending: Radiation Oncology | Admitting: Radiation Oncology

## 2013-07-11 DIAGNOSIS — C3410 Malignant neoplasm of upper lobe, unspecified bronchus or lung: Secondary | ICD-10-CM | POA: Insufficient documentation

## 2013-07-11 DIAGNOSIS — Z9221 Personal history of antineoplastic chemotherapy: Secondary | ICD-10-CM | POA: Insufficient documentation

## 2013-07-11 HISTORY — DX: Major depressive disorder, single episode, unspecified: F32.9

## 2013-07-11 HISTORY — DX: Depression, unspecified: F32.A

## 2013-07-11 HISTORY — DX: Allergy, unspecified, initial encounter: T78.40XA

## 2013-07-11 NOTE — Progress Notes (Signed)
Please see the Nurse Progress Note in the MD Initial Consult Encounter for this patient. 

## 2013-07-13 ENCOUNTER — Ambulatory Visit
Admission: RE | Admit: 2013-07-13 | Discharge: 2013-07-13 | Disposition: A | Discharge status: Home / Self Care | Payer: Federal, State, Local not specified - PPO | Source: Ambulatory Visit | Attending: Radiation Oncology | Admitting: Radiation Oncology

## 2013-07-13 DIAGNOSIS — L589 Radiodermatitis, unspecified: Secondary | ICD-10-CM | POA: Insufficient documentation

## 2013-07-13 DIAGNOSIS — Z79899 Other long term (current) drug therapy: Secondary | ICD-10-CM | POA: Insufficient documentation

## 2013-07-13 DIAGNOSIS — K209 Esophagitis, unspecified without bleeding: Secondary | ICD-10-CM | POA: Insufficient documentation

## 2013-07-13 DIAGNOSIS — C3490 Malignant neoplasm of unspecified part of unspecified bronchus or lung: Secondary | ICD-10-CM | POA: Insufficient documentation

## 2013-07-13 DIAGNOSIS — Z510 Encounter for antineoplastic radiation therapy: Secondary | ICD-10-CM | POA: Insufficient documentation

## 2013-07-13 DIAGNOSIS — Y842 Radiological procedure and radiotherapy as the cause of abnormal reaction of the patient, or of later complication, without mention of misadventure at the time of the procedure: Secondary | ICD-10-CM | POA: Insufficient documentation

## 2013-07-13 NOTE — Progress Notes (Addendum)
Radiation Oncology         [phone] ________________________________  Name: Alvin Hardy MRN: [id number]  Date: 07/11/2013  DOB: 1935/07/17  Follow-Up Visit Note  CC: [name], MD  Si Gaul, MD  Diagnosis:   Non-small cell lung cancer status post right upper lobectomy and adjuvant chemotherapy  Interval Since Last Radiation:  Not applicable   Narrative:  The patient returns today for routine follow-up.  The patient was initially seen in multidisciplinary thoracic clinic. He was found to have a stage IIIa non-small cell lung cancer status post right upper lobectomy. The plan at that time was to proceed with adjuvant chemotherapy followed by postoperative radiotherapy to the mediastinum. The patient has completed his chemotherapy and overall he states that he has done well with his treatment. He appears well today. He has been recovering from this treatment and is accompanied by his wife today. The patient had a recent CT scan of the chest completed on 06/27/2013 and he presents today for further discussion and planning for his course of radiotherapy.                              ALLERGIES:  is allergic to codeine; decadron; other; and zoloft.  Meds: Current Outpatient Prescriptions  Medication Sig Dispense Refill  . aspirin EC 81 MG tablet Take 81 mg by mouth daily.      . calcipotriene (DOVONOX) 0.005 % cream Apply 1 application topically daily as needed (for hands).       . Calcium Carbonate (CALCIUM 600 PO) Take 1 tablet by mouth daily.      . Cholecalciferol (VITAMIN D-3) 1000 UNITS CAPS Take 1,000 Units by mouth daily.       . clobetasol cream (TEMOVATE) 0.05 % Apply 1 application topically daily.       . clonazePAM (KLONOPIN) 0.5 MG tablet Take 0.5 mg by mouth at bedtime.      . colesevelam (WELCHOL) 625 MG tablet Take 3,750 mg by mouth daily with supper.      . cyanocobalamin 1000 MCG tablet Take 1,000 mcg by mouth daily.      Marland Kitchen esomeprazole (NEXIUM) 40 MG  capsule Take 40 mg by mouth daily before breakfast.      . finasteride (PROSCAR) 5 MG tablet Take 5 mg by mouth daily before breakfast.       . folic acid (FOLVITE) 1 MG tablet TAKE 1 TABLET BY MOUTH DAILY  30 tablet  0  . gabapentin (NEURONTIN) 300 MG capsule Take 600-900 mg by mouth 2 (two) times daily. Takes 2 capsules every morning, 300mg  x3 at night      . glucosamine-chondroitin 500-400 MG tablet Take 1 tablet by mouth 2 (two) times daily.       Marland Kitchen lithium carbonate (LITHOBID) 300 MG CR tablet Take 300 mg by mouth at bedtime.      . Multiple Vitamins-Minerals (MULTIVITAMIN PO) Take 2 tablets by mouth daily.       Marland Kitchen testosterone cypionate (DEPOTESTOTERONE CYPIONATE) 100 MG/ML injection Inject 100 mg into the muscle every Friday. For IM use only      . vardenafil (LEVITRA) 5 MG tablet Take 5 mg by mouth daily as needed for erectile dysfunction.       . prochlorperazine (COMPAZINE) 10 MG tablet Take 1 tablet (10 mg total) by mouth every 6 (six) hours as needed.  30 tablet  0  . propranolol (INDERAL) 10 MG  tablet Take 10-20 mg by mouth daily as needed (Takes rarely, only under extremely stressful circumstances.).        No current facility-administered medications for this encounter.    Physical Findings: The patient is in no acute distress. Patient is alert and oriented.  height is 5\' 10"  (1.778 m) and weight is 185 lb 12.8 oz (84.278 kg). His oral temperature is 98.3 F (36.8 C). His blood pressure is 146/78 and his pulse is 78. His respiration is 20. Marland Kitchen   General: Well-developed, in no acute distress HEENT: Normocephalic, atraumatic Cardiovascular: Regular rate and rhythm Respiratory: Clear to auscultation bilaterally GI: Soft, nontender, normal bowel sounds Extremities: No edema present   Lab Findings: Lab Results  Component Value Date   WBC 6.6 06/27/2013   HGB 14.7 06/27/2013   HCT 42.0 06/27/2013   MCV 92.1 06/27/2013   PLT 157 06/27/2013     Radiographic Findings: Ct Chest W  Contrast  06/27/2013   *RADIOLOGY REPORT*  Clinical Data: Staging lung cancer  CT CHEST WITH CONTRAST  Technique:  Multidetector CT imaging of the chest was performed following the standard protocol during bolus administration of intravenous contrast.  Contrast: 80mL OMNIPAQUE IOHEXOL 300 MG/ML  SOLN  Comparison: 02/14/2013  Findings: No pleural effusion identified.  The patient is status post right upper lobectomy. There is a new sub solid density within the right lung base measuring 1.1 x 1.8 cm, image 41/series 6. This is new from the previous exam.  Within the left lower lobe there is a 5 mm nodule, image 46/series 5.  Heart size appears normal.  There is no mediastinal or hilar adenopathy identified.  There is no enlarged axillary or supraclavicular adenopathy.  Limited imaging through the upper abdomen shows bilateral renal cysts.  No acute findings identified.  No mass or adenopathy.  Review of the visualized bony structures is significant for mild scoliosis and multilevel spondylosis.  IMPRESSION:  1.  No acute cardiopulmonary abnormalities. 2.  Postoperative changes from right upper lobectomy. 3.  New sub solid density within the right lower lobe is nonspecific and may be treatment related.  Recommend follow-up imaging in 3 months to confirm persistence.   Original Report Authenticated By: Signa Kell, M.D.    Impression:    The patient is appropriate to proceed with postoperative radiotherapy at this time. He has completed his chemotherapy. We discussed the role of radiotherapy in this setting once again. I would anticipate approximately 5 weeks of treatment. The patient had one finding which will be followed on future CT scans which does not appear to clearly represent disease which is in the right lung base. This is a remote site from his prior tumor/surgery.  Plan:  The patient will proceed with a simulation as soon as this can be arranged and we will proceed with treatment planning for a 5 week  course of radiation treatment.  I spent 15 minutes with the patient today, the majority of which was spent counseling the patient on the diagnosis of cancer and coordinating care.   Radene Gunning, M.D., Ph.D.

## 2013-07-15 NOTE — Progress Notes (Signed)
  Radiation Oncology         [phone] ________________________________  Name: Alvin Hardy MRN: [id number]  Date: 07/13/2013  DOB: 01-21-1935  SIMULATION AND TREATMENT PLANNING NOTE  DIAGNOSIS:  Non-small cell lung cancer  NARRATIVE:  The patient was brought to the CT Simulation planning suite.  Identity was confirmed.  All relevant records and images related to the planned course of therapy were reviewed.   Written consent to proceed with treatment was confirmed which was freely given after reviewing the details related to the planned course of therapy had been reviewed with the patient.  Then, the patient was set-up in a stable reproducible  supine position for radiation therapy.  CT images were obtained.  Surface markings were placed.    The CT images were loaded into the planning software.  Then the target and avoidance structures were contoured.  Treatment planning then occurred.  The radiation prescription was entered and confirmed.  A total of 5 complex treatment devices were fabricated which relate to the designed radiation treatment fields. Each of these customized fields/ complex treatment devices will be used on a daily basis during the radiation course. I have requested : 3D Simulation  I have requested a DVH of the following structures: Gross tumor volume, lungs, spinal cord, esophagus.   PLAN:  The patient will receive 50 Gy in 25 fractions.  ________________________________   Radene Gunning, MD, PhD

## 2013-07-19 ENCOUNTER — Ambulatory Visit: Payer: Medicare Other | Admitting: Cardiothoracic Surgery

## 2013-07-23 ENCOUNTER — Encounter: Payer: Self-pay | Admitting: Radiation Oncology

## 2013-07-23 ENCOUNTER — Ambulatory Visit
Admission: RE | Admit: 2013-07-23 | Discharge: 2013-07-23 | Disposition: A | Discharge status: Home / Self Care | Payer: Federal, State, Local not specified - PPO | Source: Ambulatory Visit | Attending: Radiation Oncology | Admitting: Radiation Oncology

## 2013-07-24 ENCOUNTER — Ambulatory Visit
Admission: RE | Admit: 2013-07-24 | Discharge: 2013-07-24 | Disposition: A | Discharge status: Home / Self Care | Payer: Federal, State, Local not specified - PPO | Source: Ambulatory Visit | Attending: Radiation Oncology | Admitting: Radiation Oncology

## 2013-07-24 MED ORDER — BIAFINE EX EMUL
CUTANEOUS | Status: DC | PRN
  Administered 2013-07-24: 11:00:00 via TOPICAL

## 2013-07-24 NOTE — Progress Notes (Signed)
Patienr education done , biafine cream given along with radiation therapy and you book, discussed skin irritation, pain, fatigue, throat difficulties,swallowing, coughing, sob, , sees MD weekly and prn after rad txs, can call for any questions/concerns, instructions given on use of biafine cream after rad txs daily, diet discusses as well, increased proitein and high calorie foods, all questions answered, gave my business card and calender 1:20 PM

## 2013-07-25 ENCOUNTER — Ambulatory Visit
Admission: RE | Admit: 2013-07-25 | Discharge: 2013-07-25 | Disposition: A | Discharge status: Home / Self Care | Payer: Federal, State, Local not specified - PPO | Source: Ambulatory Visit | Attending: Radiation Oncology | Admitting: Radiation Oncology

## 2013-07-25 ENCOUNTER — Ambulatory Visit (INDEPENDENT_AMBULATORY_CARE_PROVIDER_SITE_OTHER): Payer: Medicare Other | Admitting: Psychiatry

## 2013-07-25 DIAGNOSIS — F0630 Mood disorder due to known physiological condition, unspecified: Secondary | ICD-10-CM

## 2013-07-25 DIAGNOSIS — Z638 Other specified problems related to primary support group: Secondary | ICD-10-CM

## 2013-07-26 ENCOUNTER — Encounter: Payer: Self-pay | Admitting: Cardiothoracic Surgery

## 2013-07-26 ENCOUNTER — Ambulatory Visit (INDEPENDENT_AMBULATORY_CARE_PROVIDER_SITE_OTHER): Payer: Medicare Other | Admitting: Cardiothoracic Surgery

## 2013-07-26 ENCOUNTER — Other Ambulatory Visit: Payer: Self-pay | Admitting: Dermatology

## 2013-07-26 ENCOUNTER — Ambulatory Visit
Admission: RE | Admit: 2013-07-26 | Discharge: 2013-07-26 | Disposition: A | Discharge status: Home / Self Care | Payer: Federal, State, Local not specified - PPO | Source: Ambulatory Visit | Attending: Radiation Oncology | Admitting: Radiation Oncology

## 2013-07-26 VITALS — BP 145/90 | HR 95 | Resp 20 | Ht 70.5 in | Wt 184.5 lb

## 2013-07-26 DIAGNOSIS — R918 Other nonspecific abnormal finding of lung field: Secondary | ICD-10-CM

## 2013-07-26 DIAGNOSIS — R222 Localized swelling, mass and lump, trunk: Secondary | ICD-10-CM

## 2013-07-26 NOTE — Progress Notes (Signed)
[address]       [place] [address]             [phone]        Alvin Hardy [facility] Medical Record #[id number] Date of Birth: [birth year]  [name], MD [name], MD  Chief Complaint:   PostOp Follow Up Visit  02/21/2013   PREOPERATIVE DIAGNOSIS: Right upper lobe lung mass suspicious for  malignancy.  POSTOPERATIVE DIAGNOSIS: Right upper lobe lung mass suspicious for  malignancy. 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  PROCEDURE PERFORMED: Bronchoscopy, right upper lobe. Bronchoscopy  right video-assisted thoracoscopy, mini thoracotomy and right upper lobe  resection with lymph node dissection and placement of On-Q device.  SURGEON: Sheliah Plane, MD  PATH: stage IIIA,  EGFR: Exon 19 deletion  Mutation detected Diagnosis 1. Lung, resection (segmental or lobe), Right upper lobe - ADENOCARCINOMA, 3.4 CM. - MARGINS NOT INVOLVED. - VISCERAL PLEURA FREE OF TUMOR. - ONE BENIGN LYMPH NODE. 2. Lymph node, biopsy, Right upper 4 R - METASTATIC ADENOCARCINOMA. 3. Lymph node, biopsy, Right 2 R - ANTHRACOTIC LYMPH NODE. NO TUMOR IDENTIFIED. Microscopic Comment 1. LUNG Specimen, including laterality: Right lung lobe. Procedure: Lobectomy. Specimen integrity (intact/disrupted): Intact. Tumor site: Right upper lobe. Tumor focality: Unifocal Maximum tumor size (cm): 3.4 cm. Histologic type: Adenocarcinoma, bronchioalveolar cell type. Grade: I. Margins: Free of tumor. Visceral pleura invasion: No. Tumor extension: Within lobe. Treatment effect (if treated with neoadjuvant therapy): No. Lymph -Vascular invasion: Present. Lymph nodes: Number examined - 3 ; Number N1 nodes positive - 0 ; Number N2 nodes positive - 1 TNM code: pT2a , pN2, pMX Ancillary studies: EGFR and ALK pending. Non-neoplastic lung: Unremarkable . History of Present Illness:     Patient is making good progress  now 5 months postoperatively. He has had some chest wall discomfort, but is otherwise returning to near normal activities without difficulty. He has completed 4 cycles of chemo and stared radiation 3 days ago. Follow ct of chest done recently.   History  Smoking status  . Former Smoker  . Types: Cigarettes  . Quit date: 11/01/1978  Smokeless tobacco  . Not on file       Allergies  Allergen Reactions  . Codeine Anaphylaxis  . Decadron [Dexamethasone]     Pt says it is contraindication due to the lithium he is taking  . Other     Pt. Remarks that all pain med. Make him nauseated   . Zoloft [Sertraline Hcl] Other (See Comments)    REACTION:  unknown    Current Outpatient Prescriptions  Medication Sig Dispense Refill  . aspirin EC 81 MG tablet Take 81 mg by mouth daily.      . calcipotriene (DOVONOX) 0.005 % cream Apply 1 application topically daily as needed (for hands).       . Calcium Carbonate (CALCIUM 600 PO) Take 1 tablet by mouth daily.      . Cholecalciferol (VITAMIN D-3) 1000 UNITS CAPS Take 1,000 Units by mouth daily.       . clobetasol cream (TEMOVATE) 0.05 % Apply 1 application topically daily.       . clonazePAM (KLONOPIN) 0.5 MG tablet Take 0.5 mg by mouth at bedtime.      . cyanocobalamin 1000 MCG tablet Take 1,000 mcg by mouth daily.      Marland Kitchen  emollient (BIAFINE) cream Apply 1 application topically daily. Apply to affected skin area after rad txs daily      . esomeprazole (NEXIUM) 40 MG capsule Take 40 mg by mouth daily before breakfast.      . finasteride (PROSCAR) 5 MG tablet Take 5 mg by mouth daily before breakfast.       . gabapentin (NEURONTIN) 300 MG capsule Take 600-900 mg by mouth 2 (two) times daily. Takes 2 capsules every morning, 300mg  x3 at night      . glucosamine-chondroitin 500-400 MG tablet Take 1 tablet by mouth 2 (two) times daily.       Marland Kitchen lithium carbonate (LITHOBID) 300 MG CR tablet Take 300 mg by mouth at bedtime.      . Multiple  Vitamins-Minerals (MULTIVITAMIN PO) Take 2 tablets by mouth daily.       . prochlorperazine (COMPAZINE) 10 MG tablet Take 1 tablet (10 mg total) by mouth every 6 (six) hours as needed.  30 tablet  0  . propranolol (INDERAL) 10 MG tablet Take 10-20 mg by mouth daily as needed (Takes rarely, only under extremely stressful circumstances.).       Marland Kitchen testosterone cypionate (DEPOTESTOTERONE CYPIONATE) 100 MG/ML injection Inject 100 mg into the muscle every Friday. For IM use only      . vardenafil (LEVITRA) 5 MG tablet Take 5 mg by mouth daily as needed for erectile dysfunction.        No current facility-administered medications for this visit.       Physical Exam: BP 145/90  Pulse 95  Resp 20  Ht 5' 10.5" (1.791 m)  Wt 184 lb 8 oz (83.689 kg)  BMI 26.09 kg/m2  SpO2 98%  General appearance: alert, cooperative and no distress Neurologic: intact Heart: regular rate and rhythm, S1, S2 normal, no murmur, click, rub or gallop and normal apical impulse Lungs: clear to auscultation bilaterally and normal percussion bilaterally Abdomen: soft, non-tender; bowel sounds normal; no masses,  no organomegaly Extremities: extremities normal, atraumatic, no cyanosis or edema and Homans sign is negative, no sign of DVT Wound: The right chest wall incisions are all well-healed   Diagnostic Studies & Laboratory data:         Recent Radiology Findings: Ct Chest W Contrast  06/27/2013   *RADIOLOGY REPORT*  Clinical Data: Staging lung cancer  CT CHEST WITH CONTRAST  Technique:  Multidetector CT imaging of the chest was performed following the standard protocol during bolus administration of intravenous contrast.  Contrast: 80mL OMNIPAQUE IOHEXOL 300 MG/ML  SOLN  Comparison: 02/14/2013  Findings: No pleural effusion identified.  The patient is status post right upper lobectomy. There is a new sub solid density within the right lung base measuring 1.1 x 1.8 cm, image 41/series 6. This is new from the previous  exam.  Within the left lower lobe there is a 5 mm nodule, image 46/series 5.  Heart size appears normal.  There is no mediastinal or hilar adenopathy identified.  There is no enlarged axillary or supraclavicular adenopathy.  Limited imaging through the upper abdomen shows bilateral renal cysts.  No acute findings identified.  No mass or adenopathy.  Review of the visualized bony structures is significant for mild scoliosis and multilevel spondylosis.  IMPRESSION:  1.  No acute cardiopulmonary abnormalities. 2.  Postoperative changes from right upper lobectomy. 3.  New sub solid density within the right lower lobe is nonspecific and may be treatment related.  Recommend follow-up imaging in 3 months to  confirm persistence.   Original Report Authenticated By: Signa Kell, M.D.    Recent Labs: Lab Results  Component Value Date   WBC 6.6 06/27/2013   HGB 14.7 06/27/2013   HCT 42.0 06/27/2013   PLT 157 06/27/2013   GLUCOSE 93 06/27/2013   ALT 47 06/27/2013   AST 32 06/27/2013   NA 142 06/27/2013   K 4.5 06/27/2013   CL 107 04/24/2013   CREATININE 0.9 06/27/2013   BUN 12.4 06/27/2013   CO2 23 06/27/2013   INR 0.99 02/20/2013      Assessment / Plan:    Stable following resection of right upper lobe, lung cancer which was found to be EGFR positive adenocarcinoma stage IIIa. Patient has tolerated chemotherapy well Plan to see him back  December 11 after CT scan previously ordered for December by oncology- I suspect the "new lesion" in the right lower lobe at the diaphragm is related to site of previous chest tube placement, but will need followup CT.       Lamere Lightner B 07/26/2013 1:21 PM

## 2013-07-27 ENCOUNTER — Ambulatory Visit
Admission: RE | Admit: 2013-07-27 | Discharge: 2013-07-27 | Disposition: A | Discharge status: Home / Self Care | Payer: Federal, State, Local not specified - PPO | Source: Ambulatory Visit | Attending: Radiation Oncology | Admitting: Radiation Oncology

## 2013-07-27 ENCOUNTER — Encounter: Payer: Self-pay | Admitting: Radiation Oncology

## 2013-07-27 NOTE — Progress Notes (Signed)
Weekly rad txs, chest,rt, 4 completed, no sob, no coughing, appetite good,  No fatigue, not sleeping well, does have anxiety son going through a divorce 10:17 AM

## 2013-07-27 NOTE — Progress Notes (Signed)
Department of Radiation Oncology  Phone:  [phone] Fax:        [phone]  Weekly Treatment Note    Name: Alvin Hardy Date: 07/27/2013 MRN: [id number] DOB: 1935/02/14   Current dose: 8 Gy  Current fraction: 4   MEDICATIONS: Current Outpatient Prescriptions  Medication Sig Dispense Refill  . aspirin EC 81 MG tablet Take 81 mg by mouth daily.      . calcipotriene (DOVONOX) 0.005 % cream Apply 1 application topically daily as needed (for hands).       . Calcium Carbonate (CALCIUM 600 PO) Take 1 tablet by mouth daily.      . Cholecalciferol (VITAMIN D-3) 1000 UNITS CAPS Take 1,000 Units by mouth daily.       . clobetasol cream (TEMOVATE) 0.05 % Apply 1 application topically daily.       . clonazePAM (KLONOPIN) 0.5 MG tablet Take 0.5 mg by mouth at bedtime.      . cyanocobalamin 1000 MCG tablet Take 1,000 mcg by mouth daily.      Marland Kitchen emollient (BIAFINE) cream Apply 1 application topically daily. Apply to affected skin area after rad txs daily      . esomeprazole (NEXIUM) 40 MG capsule Take 40 mg by mouth daily before breakfast.      . finasteride (PROSCAR) 5 MG tablet Take 5 mg by mouth daily before breakfast.       . gabapentin (NEURONTIN) 300 MG capsule Take 600-900 mg by mouth 2 (two) times daily. Takes 2 capsules every morning, 300mg  x3 at night      . glucosamine-chondroitin 500-400 MG tablet Take 1 tablet by mouth 2 (two) times daily.       Marland Kitchen lithium carbonate (LITHOBID) 300 MG CR tablet Take 300 mg by mouth at bedtime.      . Multiple Vitamins-Minerals (MULTIVITAMIN PO) Take 2 tablets by mouth daily.       . prochlorperazine (COMPAZINE) 10 MG tablet Take 1 tablet (10 mg total) by mouth every 6 (six) hours as needed.  30 tablet  0  . propranolol (INDERAL) 10 MG tablet Take 10-20 mg by mouth daily as needed (Takes rarely, only under extremely stressful circumstances.).       Marland Kitchen testosterone cypionate (DEPOTESTOTERONE CYPIONATE) 100 MG/ML injection Inject 100 mg into the  muscle every Friday. For IM use only      . vardenafil (LEVITRA) 5 MG tablet Take 5 mg by mouth daily as needed for erectile dysfunction.        No current facility-administered medications for this encounter.     ALLERGIES: Codeine; Decadron; Other; and Zoloft   LABORATORY DATA:  Lab Results  Component Value Date   WBC 6.6 06/27/2013   HGB 14.7 06/27/2013   HCT 42.0 06/27/2013   MCV 92.1 06/27/2013   PLT 157 06/27/2013   Lab Results  Component Value Date   NA 142 06/27/2013   K 4.5 06/27/2013   CL 107 04/24/2013   CO2 23 06/27/2013   Lab Results  Component Value Date   ALT 47 06/27/2013   AST 32 06/27/2013   ALKPHOS 59 06/27/2013   BILITOT 1.28* 06/27/2013     NARRATIVE: Alvin Hardy was seen today for weekly treatment management. The chart was checked and the patient's films were reviewed. The patient is doing well with his first week of treatment. No difficulties so far. Appetite has been good.  PHYSICAL EXAMINATION: weight is 180 lb (81.647 kg). His oral temperature is 98.1  F (36.7 C). His blood pressure is 128/75 and his pulse is 94. His respiration is 20.        ASSESSMENT: The patient is doing satisfactorily with treatment.  PLAN: We will continue with the patient's radiation treatment as planned.

## 2013-07-30 ENCOUNTER — Ambulatory Visit
Admission: RE | Admit: 2013-07-30 | Discharge: 2013-07-30 | Disposition: A | Discharge status: Home / Self Care | Payer: Federal, State, Local not specified - PPO | Source: Ambulatory Visit | Attending: Radiation Oncology | Admitting: Radiation Oncology

## 2013-07-31 ENCOUNTER — Ambulatory Visit
Admission: RE | Admit: 2013-07-31 | Discharge: 2013-07-31 | Disposition: A | Discharge status: Home / Self Care | Payer: Federal, State, Local not specified - PPO | Source: Ambulatory Visit | Attending: Radiation Oncology | Admitting: Radiation Oncology

## 2013-08-01 ENCOUNTER — Ambulatory Visit
Admission: RE | Admit: 2013-08-01 | Discharge: 2013-08-01 | Disposition: A | Discharge status: Home / Self Care | Payer: Federal, State, Local not specified - PPO | Source: Ambulatory Visit | Attending: Radiation Oncology | Admitting: Radiation Oncology

## 2013-08-01 NOTE — Addendum Note (Signed)
Encounter addended by: Jonna Coup, MD on: 08/01/2013  1:02 PM<BR>     Documentation filed: Notes Section

## 2013-08-02 ENCOUNTER — Ambulatory Visit
Admission: RE | Admit: 2013-08-02 | Discharge: 2013-08-02 | Disposition: A | Discharge status: Home / Self Care | Payer: Federal, State, Local not specified - PPO | Source: Ambulatory Visit | Attending: Radiation Oncology | Admitting: Radiation Oncology

## 2013-08-03 ENCOUNTER — Ambulatory Visit
Admission: RE | Admit: 2013-08-03 | Discharge: 2013-08-03 | Disposition: A | Discharge status: Home / Self Care | Payer: Federal, State, Local not specified - PPO | Source: Ambulatory Visit | Attending: Radiation Oncology | Admitting: Radiation Oncology

## 2013-08-06 ENCOUNTER — Ambulatory Visit
Admission: RE | Admit: 2013-08-06 | Discharge: 2013-08-06 | Disposition: A | Discharge status: Home / Self Care | Payer: Medicare Other | Source: Ambulatory Visit | Attending: Radiation Oncology | Admitting: Radiation Oncology

## 2013-08-06 ENCOUNTER — Ambulatory Visit
Admission: RE | Admit: 2013-08-06 | Discharge: 2013-08-06 | Disposition: A | Discharge status: Home / Self Care | Payer: Federal, State, Local not specified - PPO | Source: Ambulatory Visit | Attending: Radiation Oncology | Admitting: Radiation Oncology

## 2013-08-06 ENCOUNTER — Encounter: Payer: Self-pay | Admitting: Radiation Oncology

## 2013-08-06 MED ORDER — SUCRALFATE 1 G PO TABS: 1.0000 g | ORAL_TABLET | Freq: Four times a day (QID) | ORAL | Status: DC

## 2013-08-06 NOTE — Progress Notes (Signed)
Department of Radiation Oncology  Phone:  [phone] Fax:        [phone]  Weekly Treatment Note    Name: Alvin Hardy Date: 08/06/2013 MRN: [id number] DOB: 1934/12/19   Current dose: 20 Gy  Current fraction: 10   MEDICATIONS: Current Outpatient Prescriptions  Medication Sig Dispense Refill  . aspirin EC 81 MG tablet Take 81 mg by mouth daily.      . calcipotriene (DOVONOX) 0.005 % cream Apply 1 application topically daily as needed (for hands).       . Calcium Carbonate (CALCIUM 600 PO) Take 1 tablet by mouth daily.      . Cholecalciferol (VITAMIN D-3) 1000 UNITS CAPS Take 1,000 Units by mouth daily.       . clobetasol cream (TEMOVATE) 0.05 % Apply 1 application topically daily.       . clonazePAM (KLONOPIN) 0.5 MG tablet Take 0.5 mg by mouth at bedtime.      . cyanocobalamin 1000 MCG tablet Take 1,000 mcg by mouth daily.      Marland Kitchen emollient (BIAFINE) cream Apply 1 application topically daily. Apply to affected skin area after rad txs daily      . esomeprazole (NEXIUM) 40 MG capsule Take 40 mg by mouth daily before breakfast.      . finasteride (PROSCAR) 5 MG tablet Take 5 mg by mouth daily before breakfast.       . gabapentin (NEURONTIN) 300 MG capsule Take 600-900 mg by mouth 2 (two) times daily. Takes 2 capsules every morning, 300mg  x3 at night      . glucosamine-chondroitin 500-400 MG tablet Take 1 tablet by mouth 2 (two) times daily.       Marland Kitchen lithium carbonate (LITHOBID) 300 MG CR tablet Take 300 mg by mouth at bedtime.      . Multiple Vitamins-Minerals (MULTIVITAMIN PO) Take 2 tablets by mouth daily.       . prochlorperazine (COMPAZINE) 10 MG tablet Take 1 tablet (10 mg total) by mouth every 6 (six) hours as needed.  30 tablet  0  . propranolol (INDERAL) 10 MG tablet Take 10-20 mg by mouth daily as needed (Takes rarely, only under extremely stressful circumstances.).       Marland Kitchen testosterone cypionate (DEPOTESTOTERONE CYPIONATE) 100 MG/ML injection Inject 100 mg into  the muscle every Friday. For IM use only      . vardenafil (LEVITRA) 5 MG tablet Take 5 mg by mouth daily as needed for erectile dysfunction.       . sucralfate (CARAFATE) 1 G tablet Take 1 tablet (1 g total) by mouth 4 (four) times daily.  120 tablet  2   No current facility-administered medications for this encounter.     ALLERGIES: Codeine; Decadron; Other; and Zoloft   LABORATORY DATA:  Lab Results  Component Value Date   WBC 6.6 06/27/2013   HGB 14.7 06/27/2013   HCT 42.0 06/27/2013   MCV 92.1 06/27/2013   PLT 157 06/27/2013   Lab Results  Component Value Date   NA 142 06/27/2013   K 4.5 06/27/2013   CL 107 04/24/2013   CO2 23 06/27/2013   Lab Results  Component Value Date   ALT 47 06/27/2013   AST 32 06/27/2013   ALKPHOS 59 06/27/2013   BILITOT 1.28* 06/27/2013     NARRATIVE: Alvin Hardy was seen today for weekly treatment management. The chart was checked and the patient's films were reviewed. The patient describes some increased fatigue. He also began  experiencing some esophagitis over the last few days. He does take Nexium on a daily basis.  PHYSICAL EXAMINATION: weight is 187 lb 9.6 oz (85.095 kg). His oral temperature is 98.2 F (36.8 C). His blood pressure is 129/74 and his pulse is 81. His respiration is 20.        ASSESSMENT: The patient is doing satisfactorily with treatment.  PLAN: We will continue with the patient's radiation treatment as planned. I have given the patient a prescription for Carafate.

## 2013-08-06 NOTE — Progress Notes (Signed)
Weekly rad txs, rt chest, 10 completed, now c/o sore throat starting to have dioffculty swallowing some foods, breads/muffins especially, c/o tinnitis and neuropathy in feet, has biafine cream not using as yet no skin changes or itching,  Noted or stated, fatigued 3:15 PM

## 2013-08-07 ENCOUNTER — Ambulatory Visit (INDEPENDENT_AMBULATORY_CARE_PROVIDER_SITE_OTHER): Payer: Medicare Other | Admitting: Psychiatry

## 2013-08-07 ENCOUNTER — Ambulatory Visit
Admission: RE | Admit: 2013-08-07 | Discharge: 2013-08-07 | Disposition: A | Discharge status: Home / Self Care | Payer: Federal, State, Local not specified - PPO | Source: Ambulatory Visit | Attending: Radiation Oncology | Admitting: Radiation Oncology

## 2013-08-07 DIAGNOSIS — F0630 Mood disorder due to known physiological condition, unspecified: Secondary | ICD-10-CM

## 2013-08-07 DIAGNOSIS — Z638 Other specified problems related to primary support group: Secondary | ICD-10-CM

## 2013-08-08 ENCOUNTER — Ambulatory Visit
Admission: RE | Admit: 2013-08-08 | Discharge: 2013-08-08 | Disposition: A | Discharge status: Home / Self Care | Payer: Federal, State, Local not specified - PPO | Source: Ambulatory Visit | Attending: Radiation Oncology | Admitting: Radiation Oncology

## 2013-08-08 NOTE — Progress Notes (Signed)
Department of Radiation Oncology  Phone:  [phone] Fax:        [phone]  Weekly Treatment Note    Name: Alvin Hardy Date: 08/08/2013 MRN: [id number] DOB: Jul 28, 1935   Current dose: 24 Gy  Current fraction: 12   MEDICATIONS: Current Outpatient Prescriptions  Medication Sig Dispense Refill  . aspirin EC 81 MG tablet Take 81 mg by mouth daily.      . calcipotriene (DOVONOX) 0.005 % cream Apply 1 application topically daily as needed (for hands).       . Calcium Carbonate (CALCIUM 600 PO) Take 1 tablet by mouth daily.      . Cholecalciferol (VITAMIN D-3) 1000 UNITS CAPS Take 1,000 Units by mouth daily.       . clobetasol cream (TEMOVATE) 0.05 % Apply 1 application topically daily.       . clonazePAM (KLONOPIN) 0.5 MG tablet Take 0.5 mg by mouth at bedtime.      . cyanocobalamin 1000 MCG tablet Take 1,000 mcg by mouth daily.      Marland Kitchen emollient (BIAFINE) cream Apply 1 application topically daily. Apply to affected skin area after rad txs daily      . esomeprazole (NEXIUM) 40 MG capsule Take 40 mg by mouth daily before breakfast.      . finasteride (PROSCAR) 5 MG tablet Take 5 mg by mouth daily before breakfast.       . gabapentin (NEURONTIN) 300 MG capsule Take 600-900 mg by mouth 2 (two) times daily. Takes 2 capsules every morning, 300mg  x3 at night      . glucosamine-chondroitin 500-400 MG tablet Take 1 tablet by mouth 2 (two) times daily.       Marland Kitchen lithium carbonate (LITHOBID) 300 MG CR tablet Take 300 mg by mouth at bedtime.      . Multiple Vitamins-Minerals (MULTIVITAMIN PO) Take 2 tablets by mouth daily.       . prochlorperazine (COMPAZINE) 10 MG tablet Take 1 tablet (10 mg total) by mouth every 6 (six) hours as needed.  30 tablet  0  . propranolol (INDERAL) 10 MG tablet Take 10-20 mg by mouth daily as needed (Takes rarely, only under extremely stressful circumstances.).       Marland Kitchen sucralfate (CARAFATE) 1 G tablet Take 1 tablet (1 g total) by mouth 4 (four) times daily.   120 tablet  2  . testosterone cypionate (DEPOTESTOTERONE CYPIONATE) 100 MG/ML injection Inject 100 mg into the muscle every Friday. For IM use only      . vardenafil (LEVITRA) 5 MG tablet Take 5 mg by mouth daily as needed for erectile dysfunction.        No current facility-administered medications for this encounter.     ALLERGIES: Codeine; Decadron; Other; and Zoloft   LABORATORY DATA:  Lab Results  Component Value Date   WBC 6.6 06/27/2013   HGB 14.7 06/27/2013   HCT 42.0 06/27/2013   MCV 92.1 06/27/2013   PLT 157 06/27/2013   Lab Results  Component Value Date   NA 142 06/27/2013   K 4.5 06/27/2013   CL 107 04/24/2013   CO2 23 06/27/2013   Lab Results  Component Value Date   ALT 47 06/27/2013   AST 32 06/27/2013   ALKPHOS 59 06/27/2013   BILITOT 1.28* 06/27/2013     NARRATIVE: Alvin Hardy was seen today for weekly treatment management. The chart was checked and the patient's films were reviewed. The patient is doing relatively well this week. Ongoing  fatigue. Some ongoing esophagitis. He has begun taking Carafate medication for this.  PHYSICAL EXAMINATION: Alert, in no acute distress       ASSESSMENT: The patient is doing satisfactorily with treatment.  PLAN: We will continue with the patient's radiation treatment as planned.

## 2013-08-09 ENCOUNTER — Ambulatory Visit
Admission: RE | Admit: 2013-08-09 | Discharge: 2013-08-09 | Disposition: A | Discharge status: Home / Self Care | Payer: Federal, State, Local not specified - PPO | Source: Ambulatory Visit | Attending: Radiation Oncology | Admitting: Radiation Oncology

## 2013-08-10 ENCOUNTER — Ambulatory Visit
Admission: RE | Admit: 2013-08-10 | Discharge: 2013-08-10 | Disposition: A | Discharge status: Home / Self Care | Payer: Federal, State, Local not specified - PPO | Source: Ambulatory Visit | Attending: Radiation Oncology | Admitting: Radiation Oncology

## 2013-08-13 ENCOUNTER — Ambulatory Visit
Admission: RE | Admit: 2013-08-13 | Discharge: 2013-08-13 | Disposition: A | Discharge status: Home / Self Care | Payer: Federal, State, Local not specified - PPO | Source: Ambulatory Visit | Attending: Radiation Oncology | Admitting: Radiation Oncology

## 2013-08-14 ENCOUNTER — Ambulatory Visit
Admission: RE | Admit: 2013-08-14 | Discharge: 2013-08-14 | Disposition: A | Discharge status: Home / Self Care | Payer: Federal, State, Local not specified - PPO | Source: Ambulatory Visit | Attending: Radiation Oncology | Admitting: Radiation Oncology

## 2013-08-15 ENCOUNTER — Ambulatory Visit
Admission: RE | Admit: 2013-08-15 | Discharge: 2013-08-15 | Disposition: A | Discharge status: Home / Self Care | Payer: Federal, State, Local not specified - PPO | Source: Ambulatory Visit | Attending: Radiation Oncology | Admitting: Radiation Oncology

## 2013-08-16 ENCOUNTER — Ambulatory Visit
Admission: RE | Admit: 2013-08-16 | Discharge: 2013-08-16 | Disposition: A | Discharge status: Home / Self Care | Payer: Federal, State, Local not specified - PPO | Source: Ambulatory Visit | Attending: Radiation Oncology | Admitting: Radiation Oncology

## 2013-08-17 ENCOUNTER — Ambulatory Visit
Admission: RE | Admit: 2013-08-17 | Discharge: 2013-08-17 | Disposition: A | Discharge status: Home / Self Care | Payer: Federal, State, Local not specified - PPO | Source: Ambulatory Visit | Attending: Radiation Oncology | Admitting: Radiation Oncology

## 2013-08-17 ENCOUNTER — Encounter: Payer: Self-pay | Admitting: Radiation Oncology

## 2013-08-17 NOTE — Progress Notes (Signed)
Department of Radiation Oncology  Phone:  [phone] Fax:        [phone]  Weekly Treatment Note    Name: Alvin Hardy Date: 08/17/2013 MRN: [id number] DOB: 11-22-1934   Current dose: 38 Gy  Current fraction: 19   MEDICATIONS: Current Outpatient Prescriptions  Medication Sig Dispense Refill  . aspirin EC 81 MG tablet Take 81 mg by mouth daily.      . calcipotriene (DOVONOX) 0.005 % cream Apply 1 application topically daily as needed (for hands).       . Calcium Carbonate (CALCIUM 600 PO) Take 1 tablet by mouth daily.      . Cholecalciferol (VITAMIN D-3) 1000 UNITS CAPS Take 1,000 Units by mouth daily.       . clobetasol cream (TEMOVATE) 0.05 % Apply 1 application topically daily.       . clonazePAM (KLONOPIN) 0.5 MG tablet Take 0.5 mg by mouth at bedtime.      . cyanocobalamin 1000 MCG tablet Take 1,000 mcg by mouth daily.      Marland Kitchen emollient (BIAFINE) cream Apply 1 application topically daily. Apply to affected skin area after rad txs daily      . esomeprazole (NEXIUM) 40 MG capsule Take 40 mg by mouth daily before breakfast.      . finasteride (PROSCAR) 5 MG tablet Take 5 mg by mouth daily before breakfast.       . gabapentin (NEURONTIN) 300 MG capsule Take 600-900 mg by mouth 2 (two) times daily. Takes 2 capsules every morning, 300mg  x3 at night      . glucosamine-chondroitin 500-400 MG tablet Take 1 tablet by mouth 2 (two) times daily.       Marland Kitchen lithium carbonate (LITHOBID) 300 MG CR tablet Take 300 mg by mouth at bedtime.      . Multiple Vitamins-Minerals (MULTIVITAMIN PO) Take 2 tablets by mouth daily.       . prochlorperazine (COMPAZINE) 10 MG tablet Take 1 tablet (10 mg total) by mouth every 6 (six) hours as needed.  30 tablet  0  . propranolol (INDERAL) 10 MG tablet Take 10-20 mg by mouth daily as needed (Takes rarely, only under extremely stressful circumstances.).       Marland Kitchen sucralfate (CARAFATE) 1 G tablet Take 1 tablet (1 g total) by mouth 4 (four) times daily.   120 tablet  2  . testosterone cypionate (DEPOTESTOTERONE CYPIONATE) 100 MG/ML injection Inject 100 mg into the muscle every Friday. For IM use only      . vardenafil (LEVITRA) 5 MG tablet Take 5 mg by mouth daily as needed for erectile dysfunction.        No current facility-administered medications for this encounter.     ALLERGIES: Codeine; Decadron; Other; and Zoloft   LABORATORY DATA:  Lab Results  Component Value Date   WBC 6.6 06/27/2013   HGB 14.7 06/27/2013   HCT 42.0 06/27/2013   MCV 92.1 06/27/2013   PLT 157 06/27/2013   Lab Results  Component Value Date   NA 142 06/27/2013   K 4.5 06/27/2013   CL 107 04/24/2013   CO2 23 06/27/2013   Lab Results  Component Value Date   ALT 47 06/27/2013   AST 32 06/27/2013   ALKPHOS 59 06/27/2013   BILITOT 1.28* 06/27/2013     NARRATIVE: Alvin Hardy was seen today for weekly treatment management. The chart was checked and the patient's films were reviewed. The patient is doing well this week. Some skin  changes. Occasional esophagitis which has been stable to a large extent. He is using Carafate medication as needed and does have room to increase this.  PHYSICAL EXAMINATION: weight is 187 lb 9.6 oz (85.095 kg). His oral temperature is 97.8 F (36.6 C). His blood pressure is 143/77 and his pulse is 95. His respiration is 20 and oxygen saturation is 98%.      some dry desquamation/radiation dermatitis present anteriorly and posteriorly within the chest.  ASSESSMENT: The patient is doing satisfactorily with treatment.  PLAN: We will continue with the patient's radiation treatment as planned.

## 2013-08-17 NOTE — Progress Notes (Addendum)
Weekly rad tx chest 19/25 completed, dry, erythema desquamation  On front and back of chest, using biafine only daily x 1, sugessted to use after rad tx and at bedtime, c/o itching, , difficulty swallowing certin foods, takes carafte  11:03 AM

## 2013-08-20 ENCOUNTER — Ambulatory Visit
Admission: RE | Admit: 2013-08-20 | Discharge: 2013-08-20 | Disposition: A | Discharge status: Home / Self Care | Payer: Federal, State, Local not specified - PPO | Source: Ambulatory Visit | Attending: Radiation Oncology | Admitting: Radiation Oncology

## 2013-08-21 ENCOUNTER — Ambulatory Visit
Admission: RE | Admit: 2013-08-21 | Discharge: 2013-08-21 | Disposition: A | Discharge status: Home / Self Care | Payer: Federal, State, Local not specified - PPO | Source: Ambulatory Visit | Attending: Radiation Oncology | Admitting: Radiation Oncology

## 2013-08-22 ENCOUNTER — Ambulatory Visit
Admission: RE | Admit: 2013-08-22 | Discharge: 2013-08-22 | Disposition: A | Discharge status: Home / Self Care | Payer: Federal, State, Local not specified - PPO | Source: Ambulatory Visit | Attending: Radiation Oncology | Admitting: Radiation Oncology

## 2013-08-23 ENCOUNTER — Ambulatory Visit
Admission: RE | Admit: 2013-08-23 | Discharge: 2013-08-23 | Disposition: A | Discharge status: Home / Self Care | Payer: Federal, State, Local not specified - PPO | Source: Ambulatory Visit | Attending: Radiation Oncology | Admitting: Radiation Oncology

## 2013-08-23 ENCOUNTER — Encounter: Payer: Self-pay | Admitting: Radiation Oncology

## 2013-08-23 MED ORDER — BIAFINE EX EMUL
CUTANEOUS | Status: DC | PRN
  Administered 2013-08-23: 15:00:00 via TOPICAL

## 2013-08-23 NOTE — Progress Notes (Signed)
Department of Radiation Oncology  Phone:  [phone] Fax:        [phone]  Weekly Treatment Note    Name: Alvin Hardy Date: 08/23/2013 MRN: [id number] DOB: 02/05/1935   Current dose: 46 Gy  Current fraction: 23   MEDICATIONS: Current Outpatient Prescriptions  Medication Sig Dispense Refill  . aspirin EC 81 MG tablet Take 81 mg by mouth daily.      . calcipotriene (DOVONOX) 0.005 % cream Apply 1 application topically daily as needed (for hands).       . Calcium Carbonate (CALCIUM 600 PO) Take 1 tablet by mouth daily.      . Cholecalciferol (VITAMIN D-3) 1000 UNITS CAPS Take 1,000 Units by mouth daily.       . clobetasol cream (TEMOVATE) 0.05 % Apply 1 application topically daily.       . clonazePAM (KLONOPIN) 0.5 MG tablet Take 0.5 mg by mouth at bedtime.      . cyanocobalamin 1000 MCG tablet Take 1,000 mcg by mouth daily.      Marland Kitchen emollient (BIAFINE) cream Apply 1 application topically daily. Apply to affected skin area after rad txs daily      . esomeprazole (NEXIUM) 40 MG capsule Take 40 mg by mouth daily before breakfast.      . finasteride (PROSCAR) 5 MG tablet Take 5 mg by mouth daily before breakfast.       . gabapentin (NEURONTIN) 300 MG capsule Take 600-900 mg by mouth 2 (two) times daily. Takes 2 capsules every morning, 300mg  x3 at night      . glucosamine-chondroitin 500-400 MG tablet Take 1 tablet by mouth 2 (two) times daily.       Marland Kitchen lithium carbonate (LITHOBID) 300 MG CR tablet Take 300 mg by mouth at bedtime.      . Multiple Vitamins-Minerals (MULTIVITAMIN PO) Take 2 tablets by mouth daily.       . prochlorperazine (COMPAZINE) 10 MG tablet Take 1 tablet (10 mg total) by mouth every 6 (six) hours as needed.  30 tablet  0  . propranolol (INDERAL) 10 MG tablet Take 10-20 mg by mouth daily as needed (Takes rarely, only under extremely stressful circumstances.).       Marland Kitchen sucralfate (CARAFATE) 1 G tablet Take 1 tablet (1 g total) by mouth 4 (four) times daily.   120 tablet  2  . testosterone cypionate (DEPOTESTOTERONE CYPIONATE) 100 MG/ML injection Inject 100 mg into the muscle every Friday. For IM use only      . vardenafil (LEVITRA) 5 MG tablet Take 5 mg by mouth daily as needed for erectile dysfunction.        Current Facility-Administered Medications  Medication Dose Route Frequency Provider Last Rate Last Dose  . topical emolient (BIAFINE) emulsion   Topical PRN Jonna Coup, MD         ALLERGIES: Codeine; Decadron; Other; and Zoloft   LABORATORY DATA:  Lab Results  Component Value Date   WBC 6.6 06/27/2013   HGB 14.7 06/27/2013   HCT 42.0 06/27/2013   MCV 92.1 06/27/2013   PLT 157 06/27/2013   Lab Results  Component Value Date   NA 142 06/27/2013   K 4.5 06/27/2013   CL 107 04/24/2013   CO2 23 06/27/2013   Lab Results  Component Value Date   ALT 47 06/27/2013   AST 32 06/27/2013   ALKPHOS 59 06/27/2013   BILITOT 1.28* 06/27/2013     NARRATIVE: Alvin Hardy was seen  today for weekly treatment management. The chart was checked and the patient's films were reviewed. The the patient is doing relatively well. He does have some skin irritation and has been using skin cream once daily. He had a number of questions about ongoing side effects after he finishes his treatment.  PHYSICAL EXAMINATION: weight is 191 lb (86.637 kg). His oral temperature is 97.6 F (36.4 C). His blood pressure is 108/82 and his pulse is 100. His respiration is 20.      radiation dermatitis present both anteriorly and posteriorly within the chest treatment area. No moist desquamation  ASSESSMENT: The patient is doing satisfactorily with treatment.  PLAN: We will continue with the patient's radiation treatment as planned. The patient will begin using skin cream more frequently and continued this for several weeks. I expect that he will healed satisfactorily after we finish treatment.

## 2013-08-23 NOTE — Progress Notes (Signed)
Weekly rad txs 23/25 completed, patient given another tube biafine cream, erythema and dry desquamation on back, only using 1 x day, suggested to use 2-3 times a day, c/o nausea and diarrhea recently, takes imodium , no coughing, appetite fair, ate 5guys hamburger and fries, toleratwed well, last night had difficulty swallowing, breakfast ate cheerios , pimento cheese sandwich, chewed slow, drinking fluids  3:07 PM

## 2013-08-24 ENCOUNTER — Ambulatory Visit
Admission: RE | Admit: 2013-08-24 | Discharge: 2013-08-24 | Disposition: A | Discharge status: Home / Self Care | Payer: Federal, State, Local not specified - PPO | Source: Ambulatory Visit | Attending: Radiation Oncology | Admitting: Radiation Oncology

## 2013-08-27 ENCOUNTER — Ambulatory Visit
Admission: RE | Admit: 2013-08-27 | Discharge: 2013-08-27 | Disposition: A | Discharge status: Home / Self Care | Payer: Federal, State, Local not specified - PPO | Source: Ambulatory Visit | Attending: Radiation Oncology | Admitting: Radiation Oncology

## 2013-08-27 ENCOUNTER — Encounter: Payer: Self-pay | Admitting: Radiation Oncology

## 2013-08-28 ENCOUNTER — Ambulatory Visit (INDEPENDENT_AMBULATORY_CARE_PROVIDER_SITE_OTHER): Payer: Medicare Other | Admitting: Psychiatry

## 2013-08-28 DIAGNOSIS — F0630 Mood disorder due to known physiological condition, unspecified: Secondary | ICD-10-CM

## 2013-08-28 DIAGNOSIS — Z638 Other specified problems related to primary support group: Secondary | ICD-10-CM

## 2013-09-06 ENCOUNTER — Other Ambulatory Visit: Payer: Self-pay

## 2013-09-07 ENCOUNTER — Ambulatory Visit
Admission: RE | Admit: 2013-09-07 | Discharge: 2013-09-07 | Disposition: A | Discharge status: Home / Self Care | Payer: Medicare Other | Source: Ambulatory Visit | Attending: Radiation Oncology | Admitting: Radiation Oncology

## 2013-09-07 ENCOUNTER — Emergency Department (HOSPITAL_COMMUNITY): Payer: Federal, State, Local not specified - PPO

## 2013-09-07 ENCOUNTER — Encounter (HOSPITAL_COMMUNITY): Payer: Self-pay | Admitting: Emergency Medicine

## 2013-09-07 ENCOUNTER — Other Ambulatory Visit: Payer: Self-pay

## 2013-09-07 ENCOUNTER — Emergency Department (HOSPITAL_COMMUNITY)
Admission: EM | Admit: 2013-09-07 | Discharge: 2013-09-07 | Disposition: A | Discharge status: Home / Self Care | Payer: Federal, State, Local not specified - PPO | Attending: Emergency Medicine | Admitting: Emergency Medicine

## 2013-09-07 ENCOUNTER — Encounter: Payer: Self-pay | Admitting: Radiation Oncology

## 2013-09-07 ENCOUNTER — Encounter (HOSPITAL_COMMUNITY): Payer: Self-pay | Admitting: Radiology

## 2013-09-07 DIAGNOSIS — Z79899 Other long term (current) drug therapy: Secondary | ICD-10-CM | POA: Insufficient documentation

## 2013-09-07 DIAGNOSIS — M129 Arthropathy, unspecified: Secondary | ICD-10-CM | POA: Insufficient documentation

## 2013-09-07 DIAGNOSIS — R197 Diarrhea, unspecified: Secondary | ICD-10-CM | POA: Insufficient documentation

## 2013-09-07 DIAGNOSIS — N529 Male erectile dysfunction, unspecified: Secondary | ICD-10-CM | POA: Insufficient documentation

## 2013-09-07 DIAGNOSIS — Z8669 Personal history of other diseases of the nervous system and sense organs: Secondary | ICD-10-CM | POA: Insufficient documentation

## 2013-09-07 DIAGNOSIS — Z8709 Personal history of other diseases of the respiratory system: Secondary | ICD-10-CM | POA: Insufficient documentation

## 2013-09-07 DIAGNOSIS — Z87828 Personal history of other (healed) physical injury and trauma: Secondary | ICD-10-CM | POA: Insufficient documentation

## 2013-09-07 DIAGNOSIS — Z85118 Personal history of other malignant neoplasm of bronchus and lung: Secondary | ICD-10-CM | POA: Insufficient documentation

## 2013-09-07 DIAGNOSIS — F3130 Bipolar disorder, current episode depressed, mild or moderate severity, unspecified: Secondary | ICD-10-CM | POA: Insufficient documentation

## 2013-09-07 DIAGNOSIS — K219 Gastro-esophageal reflux disease without esophagitis: Secondary | ICD-10-CM | POA: Insufficient documentation

## 2013-09-07 DIAGNOSIS — N400 Enlarged prostate without lower urinary tract symptoms: Secondary | ICD-10-CM | POA: Insufficient documentation

## 2013-09-07 DIAGNOSIS — IMO0001 Reserved for inherently not codable concepts without codable children: Secondary | ICD-10-CM | POA: Insufficient documentation

## 2013-09-07 DIAGNOSIS — R319 Hematuria, unspecified: Secondary | ICD-10-CM | POA: Insufficient documentation

## 2013-09-07 DIAGNOSIS — J029 Acute pharyngitis, unspecified: Secondary | ICD-10-CM | POA: Insufficient documentation

## 2013-09-07 DIAGNOSIS — Z87891 Personal history of nicotine dependence: Secondary | ICD-10-CM | POA: Insufficient documentation

## 2013-09-07 DIAGNOSIS — J189 Pneumonia, unspecified organism: Secondary | ICD-10-CM | POA: Insufficient documentation

## 2013-09-07 DIAGNOSIS — E291 Testicular hypofunction: Secondary | ICD-10-CM | POA: Insufficient documentation

## 2013-09-07 DIAGNOSIS — Z7982 Long term (current) use of aspirin: Secondary | ICD-10-CM | POA: Insufficient documentation

## 2013-09-07 DIAGNOSIS — R0602 Shortness of breath: Secondary | ICD-10-CM | POA: Insufficient documentation

## 2013-09-07 LAB — CBC WITH DIFFERENTIAL/PLATELET
Basophils Relative: 1 % (ref 0–1)
Eosinophils Relative: 4 % (ref 0–5)
HCT: 44.1 % (ref 39.0–52.0)
Hemoglobin: 15.4 g/dL (ref 13.0–17.0)
Lymphocytes Relative: 12 % (ref 12–46)
Lymphs Abs: 0.8 10*3/uL (ref 0.7–4.0)
MCH: 32 pg (ref 26.0–34.0)
MCV: 91.5 fL (ref 78.0–100.0)
Monocytes Absolute: 1 10*3/uL (ref 0.1–1.0)
RBC: 4.82 MIL/uL (ref 4.22–5.81)
RDW: 11.7 % (ref 11.5–15.5)
WBC: 6.4 10*3/uL (ref 4.0–10.5)

## 2013-09-07 LAB — URINALYSIS, ROUTINE W REFLEX MICROSCOPIC
Bilirubin Urine: NEGATIVE
Leukocytes, UA: NEGATIVE
Nitrite: NEGATIVE
Specific Gravity, Urine: >1.046 — ABNORMAL HIGH (ref 1.005–1.030)
Urobilinogen, UA: 0.2 mg/dL (ref 0.0–1.0)
pH: 6.5 (ref 5.0–8.0)

## 2013-09-07 LAB — COMPREHENSIVE METABOLIC PANEL
Albumin: 3.5 g/dL (ref 3.5–5.2)
BUN: 15 mg/dL (ref 6–23)
CO2: 23 mEq/L (ref 19–32)
Calcium: 9.7 mg/dL (ref 8.4–10.5)
Creatinine, Ser: 0.8 mg/dL (ref 0.50–1.35)
GFR calc Af Amer: >90 mL/min (ref >90)
GFR calc non Af Amer: 85 mL/min — ABNORMAL LOW (ref >90)
Glucose, Bld: 91 mg/dL (ref 70–99)
Total Protein: 7.3 g/dL (ref 6.0–8.3)

## 2013-09-07 LAB — URINE MICROSCOPIC-ADD ON

## 2013-09-07 MED ORDER — AZITHROMYCIN 250 MG PO TABS: 250.0000 mg | ORAL_TABLET | Freq: Every day | ORAL | Status: DC

## 2013-09-07 MED ORDER — IOHEXOL 350 MG/ML SOLN
100.0000 mL | Freq: Once | INTRAVENOUS | Status: AC | PRN
  Administered 2013-09-07: 100 mL via INTRAVENOUS

## 2013-09-07 NOTE — Progress Notes (Signed)
Wheeled patient to [facility] long emergency department per Dr. Colletta Maryland order for workup. Dr. Basilio Cairo to call triage nurse.

## 2013-09-07 NOTE — ED Provider Notes (Signed)
CSN: [id number]     Arrival date & time 09/07/13  1059 History   First MD Initiated Contact with Patient 09/07/13 1119     Chief Complaint  Patient presents with  . Fever  . Diarrhea  . Cough  . Hematuria    HPI  Alvin Hardy is a 78 y.o. male with a PMH of BPH, bipolar disorder, ED, HLD, peripheral neuropathy, cataract, Gilbert's syndrome, morton's neuroma, lung cancer s/p chemo/radiation/lobectomy, SOB, GERD, arthritis, allergies, and depresion who presents to the ED for evaluation of fever, diarrhea, cough, and hematuria.  History was provided by the patient.  Patient states he has not been feeling well for the past week. He states that he's been tired and had generalized weakness. He states that he had a fever last Saturday (09/01/13) of 101F.  He states he's had a low-grade fever all week.   He also has had rhinorrhea, myalgias, congestion, headache, cough, and sore throat.  Patient has been in contact with his primary doctor Dr. Waynard Edwards who thought his symptoms may be due to the flu. He was being treated with Augmentin since Monday (09/03/13) and Tussionex.  Cough initially was nonproductive however the last 2 days has become productive. He states that he sputum is yellow with intermittent blood-tinged sputum.  He states he's not sure if it was from a bloody nose or he had blood in his sputum. He states that he's been using Albuterol and Symbicort inhalers prescribed by his doctor, which is mildly helped his symptoms. He states he feels short of breath which is worse with exertion.  No chest pain.  Patient has also been in contact with his radiation specialist Dr. Mitzi Hansen. Dr. Mitzi Hansen did not feel that his symptoms were due to to a pneumonitis at this time.  Patient not currently undergoing chemo.  Patient told his physician that he had hematuria last night, and his doctor informed him he should go to the emergency room. Patient has also had a 5-10 pound weight loss in the past week. He states "I  didn't feel this bad during chemo."  No known sick contacts.  No recent surgeries or travel.  He states he has had diarrhea since starting ant biotics this week.  No hematochezia.  No abdominal pain, nausea, or emesis.     Past Medical History  Diagnosis Date  . BPH (benign prostatic hyperplasia)   . Hypogonadism male   . Bipolar 1 disorder   . ED (erectile dysfunction)   . Hyperlipidemia   . Neuromuscular disorder     peripheral neuropathy  . Cataract 2010    Bilateral  . Laryngopharyngeal reflux   . Gilbert's syndrome   . Deviated septum     TO THE LEFT  . Morton's neuroma     LEFT FOOT  . Lung mass 02/07/13    RIGHT UPPER LOBE  . Shortness of breath     voice breaks, ? related to reflux, although told by Dr. Christella Hartigan, S.- early 16 yr. old  . GERD (gastroesophageal reflux disease)   . Cancer     lung  . Arthritis     psoriatic arthritis, ? , treated /w mmethotrexate   . Finger injury     mallet finger-- 02/13/2013, cast in place, followed by dr. Merlyn Lot  . Allergy   . Depression    Past Surgical History  Procedure Laterality Date  . Prostate surgery  1996    TURP  . Knee arthroscopy Right 01/2007  . Pilonidal  cyst excision  1960's  . Eye surgery      cataracts removed fr. both eyes, IOL in place   . Video assisted thoracoscopy (vats)/wedge resection Right 02/21/2013    Procedure: Right VIDEO ASSISTED THORACOSCOPY ,Thoracotomy with right upper lobectomy, node sampling ;  Surgeon: Delight Ovens, MD;  Location: [facility] OR;  Service: Thoracic;  Laterality: Right;  . Video bronchoscopy N/A 02/21/2013    Procedure: VIDEO BRONCHOSCOPY;  Surgeon: Delight Ovens, MD;  Location: [facility] OR;  Service: Thoracic;  Laterality: N/A;  . Cystoscopy N/A 02/21/2013    Procedure: CYSTOSCOPY FLEXIBLE with insertion of foley catheter;  Surgeon: Antony Haste, MD;  Location: [facility] OR;  Service: Urology;  Laterality: N/A;   Family History  Problem Relation Age of Onset  . Diabetes Mother   .  Cancer Mother     abdomen  . Heart disease Father   . Diabetes Son   . Cancer Maternal Aunt     lung ca, smoker   History  Substance Use Topics  . Smoking status: Former Smoker    Types: Cigarettes    Quit date: 11/01/1978  . Smokeless tobacco: Not on file  . Alcohol Use: Yes     Comment: occas.    Review of Systems  Constitutional: Positive for fever, activity change, appetite change, fatigue and unexpected weight change. Negative for diaphoresis.  HENT: Positive for congestion, rhinorrhea and sore throat. Negative for ear discharge, ear pain, trouble swallowing and voice change.   Eyes: Negative for photophobia and visual disturbance.  Respiratory: Positive for cough and shortness of breath.   Cardiovascular: Negative for chest pain and leg swelling.  Gastrointestinal: Positive for diarrhea. Negative for nausea, vomiting, abdominal pain, constipation and blood in stool.  Genitourinary: Positive for hematuria. Negative for dysuria, urgency, frequency, flank pain, decreased urine volume, penile pain and testicular pain.  Musculoskeletal: Positive for myalgias. Negative for back pain, gait problem and neck pain.  Skin: Negative for wound.  Neurological: Positive for weakness and headaches. Negative for dizziness, syncope, light-headedness and numbness.    Allergies  Codeine; Decadron; Other; and Zoloft  Home Medications   Current Outpatient Rx  Name  Route  Sig  Dispense  Refill  . acetaminophen (TYLENOL) 325 MG tablet   Oral   Take 650 mg by mouth every 6 (six) hours as needed.         Marland Kitchen albuterol (PROVENTIL HFA;VENTOLIN HFA) 108 (90 BASE) MCG/ACT inhaler   Inhalation   Inhale into the lungs every 6 (six) hours as needed for wheezing or shortness of breath.         Marland Kitchen amoxicillin-clavulanate (AUGMENTIN) 875-125 MG per tablet   Oral   Take 1 tablet by mouth 2 (two) times daily.         Marland Kitchen aspirin EC 81 MG tablet   Oral   Take 81 mg by mouth daily.         .  budesonide-formoterol (SYMBICORT) 160-4.5 MCG/ACT inhaler   Inhalation   Inhale 2 puffs into the lungs 2 (two) times daily.         . calcipotriene (DOVONOX) 0.005 % cream   Topical   Apply 1 application topically daily as needed (for hands).          . Calcium Carbonate (CALCIUM 600 PO)   Oral   Take 1 tablet by mouth daily.         . Cholecalciferol (VITAMIN D-3) 1000 UNITS CAPS   Oral  Take 1,000 Units by mouth daily.          . clobetasol cream (TEMOVATE) 0.05 %   Topical   Apply 1 application topically daily.          . clonazePAM (KLONOPIN) 0.5 MG tablet   Oral   Take 0.5 mg by mouth at bedtime.         . cyanocobalamin 1000 MCG tablet   Oral   Take 1,000 mcg by mouth daily.         Marland Kitchen emollient (BIAFINE) cream   Topical   Apply 1 application topically daily. Apply to affected skin area after rad txs daily         . esomeprazole (NEXIUM) 40 MG capsule   Oral   Take 40 mg by mouth daily before breakfast.         . finasteride (PROSCAR) 5 MG tablet   Oral   Take 5 mg by mouth daily before breakfast.          . folic acid (FOLVITE) 1 MG tablet   Oral   Take 1 mg by mouth daily.         Marland Kitchen gabapentin (NEURONTIN) 300 MG capsule   Oral   Take 600-900 mg by mouth 2 (two) times daily. Takes 2 capsules every morning, 300mg  x3 at night         . glucosamine-chondroitin 500-400 MG tablet   Oral   Take 1 tablet by mouth 2 (two) times daily.          Marland Kitchen HYDROcodone-homatropine (HYCODAN) 5-1.5 MG/5ML syrup   Oral   Take 5 mLs by mouth every 6 (six) hours as needed for cough.         . lithium carbonate (LITHOBID) 300 MG CR tablet   Oral   Take 300 mg by mouth at bedtime.         . Multiple Vitamins-Minerals (MULTIVITAMIN PO)   Oral   Take 2 tablets by mouth daily.          . prochlorperazine (COMPAZINE) 10 MG tablet   Oral   Take 1 tablet (10 mg total) by mouth every 6 (six) hours as needed.   30 tablet   0   . propranolol  (INDERAL) 10 MG tablet   Oral   Take 10-20 mg by mouth daily as needed (Takes rarely, only under extremely stressful circumstances.).          Marland Kitchen sucralfate (CARAFATE) 1 G tablet   Oral   Take 1 tablet (1 g total) by mouth 4 (four) times daily.   120 tablet   2   . testosterone cypionate (DEPOTESTOTERONE CYPIONATE) 100 MG/ML injection   Intramuscular   Inject 100 mg into the muscle every Friday. For IM use only         . vardenafil (LEVITRA) 5 MG tablet   Oral   Take 5 mg by mouth daily as needed for erectile dysfunction.           BP 146/87  Pulse 111  Temp(Src) 98.1 F (36.7 C) (Oral)  Resp 20  SpO2 97%  Filed Vitals:   09/07/13 1114 09/07/13 1258 09/07/13 1540 09/07/13 1635  BP: 146/87 137/77  141/78  Pulse: 111 106  100  Temp: 98.1 F (36.7 C)     TempSrc: Oral     Resp: 20 20  18   SpO2: 97% 90% 96% 94%    Physical Exam  Nursing note and vitals reviewed.  Constitutional: He is oriented to person, place, and time. He appears well-developed and well-nourished. No distress.  HENT:  Head: Normocephalic and atraumatic.  Right Ear: External ear normal.  Left Ear: External ear normal.  Nose: Nose normal.  Mouth/Throat: Oropharynx is clear and moist. No oropharyngeal exudate.  Eyes: Conjunctivae and EOM are normal. Pupils are equal, round, and reactive to light. Right eye exhibits no discharge. Left eye exhibits no discharge.  Neck: Normal range of motion. Neck supple.  Cardiovascular: Regular rhythm, normal heart sounds and intact distal pulses.  Exam reveals no gallop and no friction rub.   No murmur heard. Tachycardic.  Dorsalis pedis pulses present bilaterally  Pulmonary/Chest: Effort normal and breath sounds normal. No respiratory distress. He has no wheezes. He has no rales. He exhibits no tenderness.  Diminished breath sounds throughout  Abdominal: Soft. Bowel sounds are normal. He exhibits no distension and no mass. There is no tenderness. There is no  rebound and no guarding.  Musculoskeletal: Normal range of motion. He exhibits no edema and no tenderness.  No pedal edema or calf tenderness bilaterally  Neurological: He is alert and oriented to person, place, and time.  Skin: Skin is warm and dry. He is not diaphoretic.    ED Course  Procedures (including critical care time) Labs Review Labs Reviewed - No data to display Imaging Review No results found.  EKG Interpretation     Ventricular Rate:  106 PR Interval:  178 QRS Duration: 93 QT Interval:  351 QTC Calculation: 466 R Axis:   74 Text Interpretation:  Sinus tachycardia           Results for orders placed during the hospital encounter of 09/07/13  CBC WITH DIFFERENTIAL      Result Value Range   WBC 6.4  4.0 - 10.5 K/uL   RBC 4.82  4.22 - 5.81 MIL/uL   Hemoglobin 15.4  13.0 - 17.0 g/dL   HCT 02.7  25.3 - 66.4 %   MCV 91.5  78.0 - 100.0 fL   MCH 32.0  26.0 - 34.0 pg   MCHC 34.9  30.0 - 36.0 g/dL   RDW 40.3  47.4 - 25.9 %   Platelets 215  150 - 400 K/uL   Neutrophils Relative % 67  43 - 77 %   Neutro Abs 4.3  1.7 - 7.7 K/uL   Lymphocytes Relative 12  12 - 46 %   Lymphs Abs 0.8  0.7 - 4.0 K/uL   Monocytes Relative 15 (*) 3 - 12 %   Monocytes Absolute 1.0  0.1 - 1.0 K/uL   Eosinophils Relative 4  0 - 5 %   Eosinophils Absolute 0.3  0.0 - 0.7 K/uL   Basophils Relative 1  0 - 1 %   Basophils Absolute 0.1  0.0 - 0.1 K/uL  COMPREHENSIVE METABOLIC PANEL      Result Value Range   Sodium 135  135 - 145 mEq/L   Potassium 5.0  3.5 - 5.1 mEq/L   Chloride 100  96 - 112 mEq/L   CO2 23  19 - 32 mEq/L   Glucose, Bld 91  70 - 99 mg/dL   BUN 15  6 - 23 mg/dL   Creatinine, Ser 5.63  0.50 - 1.35 mg/dL   Calcium 9.7  8.4 - 87.5 mg/dL   Total Protein 7.3  6.0 - 8.3 g/dL   Albumin 3.5  3.5 - 5.2 g/dL   AST 37  0 - 37 U/L  ALT 28  0 - 53 U/L   Alkaline Phosphatase 63  39 - 117 U/L   Total Bilirubin 0.9  0.3 - 1.2 mg/dL   GFR calc non Af Amer 85 (*) >90 mL/min   GFR  calc Af Amer >90  >90 mL/min  URINALYSIS, ROUTINE W REFLEX MICROSCOPIC      Result Value Range   Color, Urine YELLOW  YELLOW   APPearance CLEAR  CLEAR   Specific Gravity, Urine >1.046 (*) 1.005 - 1.030   pH 6.5  5.0 - 8.0   Glucose, UA NEGATIVE  NEGATIVE mg/dL   Hgb urine dipstick SMALL (*) NEGATIVE   Bilirubin Urine NEGATIVE  NEGATIVE   Ketones, ur 40 (*) NEGATIVE mg/dL   Protein, ur NEGATIVE  NEGATIVE mg/dL   Urobilinogen, UA 0.2  0.0 - 1.0 mg/dL   Nitrite NEGATIVE  NEGATIVE   Leukocytes, UA NEGATIVE  NEGATIVE  TROPONIN I      Result Value Range   Troponin I <0.30  <0.30 ng/mL  URINE MICROSCOPIC-ADD ON      Result Value Range   RBC / HPF 7-10  <3 RBC/hpf   Urine-Other MUCOUS PRESENT      CT Angio Chest PE W/Cm &/Or Wo Cm (Final result)  Result time: 09/07/13 14:26:27    Final result by Rad Results In Interface (09/07/13 14:26:27)    Narrative:   CLINICAL DATA: Status post lung surgery, cough shortness of breath, hemoptysis  EXAM: CT ANGIOGRAPHY CHEST WITH CONTRAST  TECHNIQUE: Multidetector CT imaging of the chest was performed using the standard protocol during bolus administration of intravenous contrast. Multiplanar CT image reconstructions including MIPs were obtained to evaluate the vascular anatomy.  CONTRAST: OMNIPAQUE IOHEXOL 350 MG/ML SOLN  COMPARISON: 06/27/2013  FINDINGS: The thoracic inlet is unremarkable. The mediastinum demonstrates linearly oriented increased soft tissue density within the anterior superior right hilar region extending posteriorly. Linear areas of high density are appreciated within these findings. Considering the patient's history these findings abdominal wall appearance of postsurgical changes. It appears stable when compared to the previous study. Residual or recurrent disease cannot be excluded. The There is no evidence of filling defects within the main, lobar, or segmental pulmonary arteries.  The lung parenchyma  demonstrates diffuse airspace disease in the posterior aspect of the right upper lobe, superior segment right lower lobe. Mild increased density projects in the the base of the left lower lobe.  Visualized upper abdominal viscera grossly unremarkable. Multilevel degenerative changes are appreciated within the thoracic spine.  Review of the MIP images confirms the above findings.  IMPRESSION: 1. No CT evidence of pulmonary arterial embolic disease  2. Diffuse areas of pneumonitis within the superior segment right lower lobe and to a lesser extent at the posterior aspect of the right upper lobe differential considerations are postsurgical changes versus an infectious or inflammatory pneumonitis. Pulmonary hemorrhage considering the patient's history is a diagnostic consideration. Asymmetric edema is of much lower consideration considering the findings in the remaining portions of the lung.   Electronically Signed By: Salome Holmes M.D. On: 09/07/2013 14:26             DG Chest 2 View (Final result)  Result time: 09/07/13 13:00:28    Final result by Rad Results In Interface (09/07/13 13:00:28)    Narrative:   CLINICAL DATA: Chest pain, previous right upper lobectomy for lung cancer  EXAM: CHEST 2 VIEW  COMPARISON: 03/15/2013, 06/27/2013  FINDINGS: Chronic postop changes in the right hemi thorax  with volume loss. Patient is status post right upper lobectomy. Normal heart size and vascularity. No definite superimposed pneumonia, collapse or consolidation. No effusion or pneumothorax. Stable rightward tracheal deviation. Degenerative changes of the spine.  IMPRESSION: Stable postoperative changes in the right hemi thorax. No superimposed acute process   Electronically Signed By: Ruel Favors M.D. On: 09/07/2013 13:00         MDM   1. Hematuria   2. Pneumonitis     Alvin Hardy is a 78 y.o. male with a PMH of BPH, bipolar disorder, ED, HLD, peripheral  neuropathy, cataract, Gilbert's syndrome, morton's neuroma, lung cancer s/p chemo/radiation/lobectomy, SOB, GERD, arthritis, allergies, and depresion who presents to the ED for evaluation of fever, diarrhea, cough, and hematuria.  CT angio, chest x-ray, UA, CBC, CMP, troponin, EKG ordered.    Rechecks  3:45 PM = Patient able to ambulate without hypoxia.  94% lowest on room air.   4:00 PM = Patient resting comfortably. Results discussed.     Patient was found to have pneumonitis on his CT scan with infectious vs inflammatory causes.  Pulmonary hemorrhage less likely.  No evidence of pulmonary embolism.  Troponin negative. EKG with no acute ischemic changes.  Patient with no significant hypoxia with ambulation, afebrile, and was non-toxic in appearance.  Labs otherwise unremarkable.  Patient started on Azithromycin.  Patient found to have hematuria with no evidence of UTI.  Patient instructed to follow-up with his PCP.  Follow-up and return precautions were discussed.  Patient has multiple other complaints which require follow-up as an OP.  Patient in agreement with discharge and plan.   Final impressions: 1. Pneumonitis  2. Hematuria     Luiz Iron PA-C   This patient was discussed with Dr. Meryl Dare, PA-C 09/08/13 1551

## 2013-09-07 NOTE — ED Provider Notes (Signed)
Medical screening examination/treatment/procedure(s) were conducted as a shared visit with non-physician practitioner(s) and myself.  I personally evaluated the patient during the encounter.  Patient is a 78 year old male with a history of lung cancer status post lobectomy, chemotherapy that ended in July 2014 and radiation that ended 08/27/2013 who presents the emergency department with a week of intermittent fevers, dry cough, fatigue and shortness of breath with exertion. No chest pain. Patient has been treated for possible pneumonia with Augmentin with no relief. He is also seeing his radiation oncologist who did not feel this was radiation pneumonitis. Presents today with worsening symptoms. He is not hypoxic but is tachycardic. No fever. Lungs are clear to auscultation. Concern for possible pneumonia versus ACS versus PE. We'll obtain cardiac labs, chest x-ray and CT angiogram of his chest. Patient agrees with this plan.    Date: 09/07/2013 12:28 PM  Rate: 106  Rhythm: Sinus tachycardia  QRS Axis: normal  Intervals: normal  ST/T Wave abnormalities: normal  Conduction Disutrbances: none  Narrative Interpretation: Sinus tachycardia, slight J-point elevation in inferior leads, no convex appearance of ST elevation    Patient's labs are unremarkable. Troponin is negative. His chest x-ray is clear. CT angiogram his chest shows no pulmonary embolus. Patient has diffuse areas of pneumonitis that may be postsurgical changes versus infectious versus inflammatory. Pulmonary hemorrhage is also on the differential however given patient's symptoms have been present for a week and he has no hypoxia, hemoptysis or decrease in his hemoglobin, I feel this is unlikely. Patient's oxygen saturation is normal on room air. He was able to ambulate with out any desaturation. Have discussed with patient and family that given his symptoms of fever, productive cough, I feel this is likely infectious in nature. Have  discussed however that his symptoms may be due to ACS although unlikely given no EKG changes or positive troponin. Have offered admission the patient reports he would like discharge home with outpatient followup on Monday, 3 days from now. We'll discharge home with prescription for azithromycin to cover for possible atypical infection. Patient is outside of any treatment window for influenza. Have given strict, customary and usual return precautions. Patient and wife at bedside verbalize understanding and are comfortable with plan.   Layla Maw Kalena Mander, DO 09/07/13 1606

## 2013-09-07 NOTE — ED Notes (Addendum)
Pt from home reports that he is a lung Ca pt with fever x1 week, productive cough with blood tinged sputum, weakness, hematuria, inability to eat/drink, 5-8 lb wt loss in 1 week. Pt last chemo was August. Pt is A&O and in NAD. Pt denies CP, SOB but states generalized body aches

## 2013-09-07 NOTE — Progress Notes (Signed)
Radiation Oncology         [phone] ________________________________  Name: Alvin Hardy MRN: [id number]  Date: 09/07/2013  DOB: February 06, 1935  Follow-Up Visit Note  Outpatient  CC: [name], MD  [name], MD  Diagnosis and Prior Radiotherapy:  Non-small cell lung cancer status post right upper lobectomy and adjuvant chemotherapy  He subsequently completed 50 Gy/25 fractions to the chest on 08-27-13  Narrative:  The patient returns today for impromptu follow-up. He reports flu like symptoms x 1 week. Reports his PCP has been treating him for the flu. Patient is diaphoretic but, without fever.   Reports he has no appetite and has eaten very little all week. Only coffee this AM.. Poor appetite, no nausea, but has had diarrhea. Seems diarrhea may have started around the time he initiated augmentin.  He is weak. Reports a mild frontal headache x 1 week. Reports taking tylenol for headache but, denies relief. Reports that he ran a fever of 101 on Saturday and 99 on Sunday. Reports dizziness upon standing. Reports that he feels like he is freezing all the time. Reports he had blood tinged mucus from his nasal cavity yesterday . No bright red blood in mucus or significant hemoptysis.  He noted blood in his urine yesterday but no frequency, dysuria, urgency. Reports he has lost 5 lbs over the last week. Reports cough with green sputum. Reports fatigue.  Not significantly SOB compared to baseline, except when coughing.  He is not smoking.        I spoke with Dr. Waynard Edwards (PCP) who reports Nov 3 he saw the patient - 99.4 temp, aches, HA, cough. CXR was unremarkable in his office. Shot of Rocephin given.  Sent home on Augmentin PO 875 BID,  Steroid inhalers, Mucinex / hydrocodone cough syrup. He did get the flu shot in late August (Aug 26th). Dr. Waynard Edwards wanted our input on ? Pneumonitis from RT.  ALLERGIES:  is allergic to codeine; decadron; other; and zoloft.  Meds: Current Outpatient  Prescriptions  Medication Sig Dispense Refill  . acetaminophen (TYLENOL) 325 MG tablet Take 650 mg by mouth every 6 (six) hours as needed.      Marland Kitchen albuterol (PROVENTIL HFA;VENTOLIN HFA) 108 (90 BASE) MCG/ACT inhaler Inhale into the lungs every 6 (six) hours as needed for wheezing or shortness of breath.      Marland Kitchen amoxicillin-clavulanate (AUGMENTIN) 875-125 MG per tablet Take 1 tablet by mouth 2 (two) times daily.      Marland Kitchen aspirin EC 81 MG tablet Take 81 mg by mouth daily.      . budesonide-formoterol (SYMBICORT) 160-4.5 MCG/ACT inhaler Inhale 2 puffs into the lungs 2 (two) times daily.      . calcipotriene (DOVONOX) 0.005 % cream Apply 1 application topically daily as needed (for hands).       . Calcium Carbonate (CALCIUM 600 PO) Take 1 tablet by mouth daily.      . Cholecalciferol (VITAMIN D-3) 1000 UNITS CAPS Take 1,000 Units by mouth daily.       . clobetasol cream (TEMOVATE) 0.05 % Apply 1 application topically daily.       . clonazePAM (KLONOPIN) 0.5 MG tablet Take 0.5 mg by mouth at bedtime.      . cyanocobalamin 1000 MCG tablet Take 1,000 mcg by mouth daily.      Marland Kitchen esomeprazole (NEXIUM) 40 MG capsule Take 40 mg by mouth daily before breakfast.      . finasteride (PROSCAR) 5 MG tablet  Take 5 mg by mouth daily before breakfast.       . folic acid (FOLVITE) 1 MG tablet Take 1 mg by mouth daily.      Marland Kitchen gabapentin (NEURONTIN) 300 MG capsule Take 600-900 mg by mouth 2 (two) times daily. Takes 2 capsules every morning, 300mg  x3 at night      . glucosamine-chondroitin 500-400 MG tablet Take 1 tablet by mouth 2 (two) times daily.       Marland Kitchen HYDROcodone-homatropine (HYCODAN) 5-1.5 MG/5ML syrup Take 5 mLs by mouth every 6 (six) hours as needed for cough.      . lithium carbonate (LITHOBID) 300 MG CR tablet Take 300 mg by mouth at bedtime.      . Multiple Vitamins-Minerals (MULTIVITAMIN PO) Take 2 tablets by mouth daily.       . sucralfate (CARAFATE) 1 G tablet Take 1 tablet (1 g total) by mouth 4 (four)  times daily.  120 tablet  2  . testosterone cypionate (DEPOTESTOTERONE CYPIONATE) 100 MG/ML injection Inject 100 mg into the muscle every Friday. For IM use only      . vardenafil (LEVITRA) 5 MG tablet Take 5 mg by mouth daily as needed for erectile dysfunction.       Marland Kitchen emollient (BIAFINE) cream Apply 1 application topically daily. Apply to affected skin area after rad txs daily      . prochlorperazine (COMPAZINE) 10 MG tablet Take 1 tablet (10 mg total) by mouth every 6 (six) hours as needed.  30 tablet  0  . propranolol (INDERAL) 10 MG tablet Take 10-20 mg by mouth daily as needed (Takes rarely, only under extremely stressful circumstances.).        No current facility-administered medications for this encounter.    Physical Findings: The patient is in no acute distress. Patient is alert and oriented. No coughing during visit.  weight is 183 lb 12.8 oz (83.371 kg). His oral temperature is 98.5 F (36.9 C). His blood pressure is 157/88 and his pulse is 114. His respiration is 20 and oxygen saturation is 96%. .    Oral cavity/pharynx - relatively moist, no lesions or erythema. Lung CTAB, Heart tachycardic, regular rhythm.  Normoactive bowel sounds, nontender, non distended abdomen. Radiation hyperpigmentation of chest noted. Hyperpigmentation with dry scaly skin noted upper back. Reports that he continues to use biafine bid.    Lab Findings: Lab Results  Component Value Date   WBC 6.6 06/27/2013   HGB 14.7 06/27/2013   HCT 42.0 06/27/2013   MCV 92.1 06/27/2013   PLT 157 06/27/2013    Radiographic Findings: No results found.  Impression/Plan:   I reviewed the patient's plan. His lung doses were not high risk for pneumonitis: V20 < 20%, V5 ~ 40%.  Considering this data, and that it is only 1-2 weeks from completing RT, and he did not receive concurrent chemotherapy, I highly doubt this is radiation pneumonitis (which tends to be ~8 weeks post RT).  Also, his symptoms are not classic for  pneumonitis  - he is not particularly short of breath and his cough is not dry.  He has other symptoms (ie malaise, body aches, blood in urine) that suggest possibly infectious etiology refractory to current ABX. He may be dehydrated with poor PO intake and weight loss.  I will send him to the ED now. I called the triage nurse to give her a heads up.   Of note, my nurse appreciated Papules noted in right ear canal.  I  did not check this area myself.  Patient agreeable to plan.   I spent 20 minutes face to face with the patient and more than 50% of that time was spent in counseling and/or coordination of care. _____________________________________   Lonie Peak, MD

## 2013-09-07 NOTE — Progress Notes (Addendum)
Patient reports flu like symptoms x 1 week. Reports his PCP has been treating him for the flu. Patient is diaphoretic but, without fever. Papules noted in right ear canal. Hyperpigmentation of chest noted. Hyperpigmentation with dry scaly skin noted upper back. Reports that he continues to use biafine bid. Reports he has no appetite and has eaten very little all week. Reports a mild frontal headache x 1 week. Reports taking tylenol for headache but, denies relief. Reports that he ran a fever of 101 on Saturday and 99 on Sunday. Reports dizziness upon standing. Reports that he feels like he is freezing all the time. Reports he coughed up blood yesterday and noted blood in his urine yesterday. Reports he has lost 5 lbs over the last week. No ulcerations or pustules noted oral mucosa. Reports cough with green sputum. Reports fatigue. Lungs clear to auscultation.

## 2013-09-09 ENCOUNTER — Encounter: Payer: Self-pay | Admitting: Internal Medicine

## 2013-09-11 ENCOUNTER — Telehealth: Payer: Self-pay | Admitting: *Deleted

## 2013-09-11 NOTE — Telephone Encounter (Signed)
Mailed authorization for use/disclosure of protected health information to patient's home today

## 2013-09-12 ENCOUNTER — Telehealth: Payer: Self-pay | Admitting: *Deleted

## 2013-09-12 ENCOUNTER — Encounter: Payer: Self-pay | Admitting: Cardiothoracic Surgery

## 2013-09-12 NOTE — Telephone Encounter (Signed)
Received email messages from pt re: pt went to ER on 09/07/13 and had CT scan, cxr, along with lab draws.  Pt had questions about upcoming CT and labs scheduled for 10/03/13 as to  whether it was needed.  Printed radiology reports along with lab results , and gave to Dr. Arbutus Ped to review.  Per Dr. Arbutus Ped, no need to repeat CT scan, but pt does need labs prior to office visit on 10/04/13.  Called pt at home and informed pt of md's instructions.  Gave pt new time for lab appt on 10/04/13; also send a reply via email to pt.  Pt voiced understanding. Called [facility] radiology to cancel pt's CT scan for 10/03/13.

## 2013-09-20 NOTE — Progress Notes (Signed)
  Radiation Oncology         [phone]) [phone] ________________________________  Name: Alvin Hardy MRN: [id number]  Date: 07/23/2013  DOB: 07/25/1935  Simulation Verification Note   NARRATIVE: The patient was brought to the treatment unit and placed in the planned treatment position. The clinical setup was verified. Then port films were obtained and uploaded to the radiation oncology medical record software.  The treatment beams were carefully compared against the planned radiation fields. The position, location, and shape of the radiation fields was reviewed. The targeted volume of tissue appears to be appropriately covered by the radiation beams. Based on my personal review, I approved the simulation verification. The patient's treatment will proceed as planned.  ________________________________   Radene Gunning, MD, PhD

## 2013-09-20 NOTE — Progress Notes (Signed)
  Radiation Oncology         [phone]) [phone] ________________________________  Name: Alvin Hardy MRN: [id number]  Date: 08/27/2013  DOB: [birth year]  End of Treatment Note  Diagnosis:   A lung cancer     Indication for treatment:  Curative       Radiation treatment dates:   07/24/2013 through 08/27/2013  Site/dose:   The patient was treated to the operative region centrally within the chest to a dose of 50 gray in 25 fractions at 2 gray per fraction. This was completed using a 5 field 3-D conformal technique.  Narrative: The patient tolerated radiation treatment relatively well.   The patient experienced some fatigue and some esophagitis during treatment.  Plan: The patient has completed radiation treatment. The patient will return to radiation oncology clinic for routine followup in one month. I advised the patient to call or return sooner if they have any questions or concerns related to their recovery or treatment. ________________________________  Radene Gunning, M.D., Ph.D.     and

## 2013-09-21 ENCOUNTER — Telehealth: Payer: Self-pay | Admitting: Radiation Oncology

## 2013-09-21 NOTE — Telephone Encounter (Signed)
Faxed [facility] 09/07/13 report to Dr. Waynard Edwards, mailed to patient.  OK per [facility].

## 2013-10-02 ENCOUNTER — Telehealth: Payer: Self-pay | Admitting: Medical Oncology

## 2013-10-03 ENCOUNTER — Ambulatory Visit (INDEPENDENT_AMBULATORY_CARE_PROVIDER_SITE_OTHER): Payer: Medicare Other | Admitting: Psychiatry

## 2013-10-03 ENCOUNTER — Other Ambulatory Visit: Payer: Medicare Other | Admitting: Lab

## 2013-10-03 ENCOUNTER — Ambulatory Visit (HOSPITAL_COMMUNITY): Payer: Medicare Other

## 2013-10-03 DIAGNOSIS — Z638 Other specified problems related to primary support group: Secondary | ICD-10-CM

## 2013-10-03 DIAGNOSIS — F0630 Mood disorder due to known physiological condition, unspecified: Secondary | ICD-10-CM

## 2013-10-03 NOTE — Telephone Encounter (Signed)
Feels better after a very bad November in ED being treated for ?pneumonia with several antibiotics.. Still has persistent cough. He will see Dr Arbutus Ped tomorrow as scheduled.

## 2013-10-04 ENCOUNTER — Telehealth: Payer: Self-pay | Admitting: Internal Medicine

## 2013-10-04 ENCOUNTER — Encounter: Payer: Self-pay | Admitting: Internal Medicine

## 2013-10-04 ENCOUNTER — Other Ambulatory Visit (HOSPITAL_BASED_OUTPATIENT_CLINIC_OR_DEPARTMENT_OTHER): Payer: Medicare Other | Admitting: Lab

## 2013-10-04 ENCOUNTER — Ambulatory Visit (HOSPITAL_BASED_OUTPATIENT_CLINIC_OR_DEPARTMENT_OTHER): Payer: Medicare Other | Admitting: Internal Medicine

## 2013-10-04 DIAGNOSIS — C3410 Malignant neoplasm of upper lobe, unspecified bronchus or lung: Secondary | ICD-10-CM

## 2013-10-04 LAB — COMPREHENSIVE METABOLIC PANEL (CC13)
AST: 21 U/L (ref 5–34)
Alkaline Phosphatase: 72 U/L (ref 40–150)
Anion Gap: 11 mEq/L (ref 3–11)
BUN: 13.1 mg/dL (ref 7.0–26.0)
Creatinine: 1.1 mg/dL (ref 0.7–1.3)
Glucose: 102 mg/dl (ref 70–140)
Potassium: 4.7 mEq/L (ref 3.5–5.1)
Total Bilirubin: 1.01 mg/dL (ref 0.20–1.20)

## 2013-10-04 LAB — CBC WITH DIFFERENTIAL/PLATELET
Basophils Absolute: 0.1 10*3/uL (ref 0.0–0.1)
EOS%: 3.3 % (ref 0.0–7.0)
HGB: 15.7 g/dL (ref 13.0–17.1)
LYMPH%: 24.9 % (ref 14.0–49.0)
MCH: 30.7 pg (ref 27.2–33.4)
MCHC: 33.5 g/dL (ref 32.0–36.0)
MCV: 91.6 fL (ref 79.3–98.0)
MONO%: 11.2 % (ref 0.0–14.0)
NEUT#: 4 10*3/uL (ref 1.5–6.5)
NEUT%: 59.8 % (ref 39.0–75.0)
Platelets: 213 10*3/uL (ref 140–400)
RDW: 12.8 % (ref 11.0–14.6)
lymph#: 1.7 10*3/uL (ref 0.9–3.3)

## 2013-10-04 NOTE — Progress Notes (Signed)
[facility] Telephone:([phone]) [phone]   [phone]  OFFICE PROGRESS NOTE  [name], MD [address] [place] [address]  DIAGNOSIS AND STAGE: Stage III A (T2 A., N2, M0) non-small cell lung cancer, adenocarcinoma with positive EGFR mutation in exon 19 and negative ALK gene translocation diagnosed in April 2014   PRIOR THERAPY:  1) Status post right upper lobectomy with lymph node dissection under the care of Dr. Tyrone Sage on 02/21/2013. 2) Systemic adjuvant chemotherapy with carboplatin for AUC of 5 and Alimta 500 mg/M2 every 3 weeks. Status post 4 cycles. Last cycle was given on 05/29/2013. (Carboplatin was used in place of cisplatin secondary to concern about intolerability and significant adverse effects of cisplatin).  3) Adjuvant radiotherapy to the mediastinum under the care of Dr. Mitzi Hansen completed 08/27/2013.   CURRENT THERAPY: Observation  CHEMOTHERAPY INTENT: adjuvant/curative  CURRENT # OF CHEMOTHERAPY CYCLES: 0 CURRENT ANTIEMETICS: Zofran, Decadron and Compazine  CURRENT SMOKING STATUS: currently a nonsmoker  ORAL CHEMOTHERAPY AND CONSENT: None  CURRENT BISPHOSPHONATES USE: None  PAIN MANAGEMENT: no pain  NARCOTICS INDUCED CONSTIPATION: N/A  LIVING WILL AND CODE STATUS: Full Code.   INTERVAL HISTORY: CORDEL DREWES 78 y.o. male returns to the clinic today for followup visit. He completed a course of adjuvant radiotherapy to the chest on 08/27/2013.  He was seen at the emergency Department in early November 2014 complaining of increasing dyspnea, intermittent fever, dry cough and fatigue. He had Ct angiogram of the chest performed at that time and it showed no evidence for pulmonary embolism but there was diffuse areas of pneumonitis within the superior segment right lower lobe and to a lesser extent at the posterior aspect of the right upper lobe differential considerations are postsurgical changes versus an infectious or inflammatory  pneumonitis. Pulmonary hemorrhage considering the patient's history is a diagnostic consideration. Asymmetric edema is of much lower consideration considering the findings in the remaining portions of the lung. He was treated with a course of antibiotics by his primary care physician Dr. Waynard Edwards.  The patient is feeling fine today with no specific complaints. The patient denied having any significant fever or chills. He has no nausea or vomiting. He denied having any significant chest pain, shortness of breath, cough or hemoptysis.   MEDICAL HISTORY: Past Medical History  Diagnosis Date  . BPH (benign prostatic hyperplasia)   . Hypogonadism male   . Bipolar 1 disorder   . ED (erectile dysfunction)   . Hyperlipidemia   . Neuromuscular disorder     peripheral neuropathy  . Cataract 2010    Bilateral  . Laryngopharyngeal reflux   . Gilbert's syndrome   . Deviated septum     TO THE LEFT  . Morton's neuroma     LEFT FOOT  . Lung mass 02/07/13    RIGHT UPPER LOBE  . Shortness of breath     voice breaks, ? related to reflux, although told by Dr. Christella Hartigan, S.- early 70 yr. old  . GERD (gastroesophageal reflux disease)   . Cancer     lung  . Arthritis     psoriatic arthritis, ? , treated /w mmethotrexate   . Finger injury     mallet finger-- 02/13/2013, cast in place, followed by dr. Merlyn Lot  . Allergy   . Depression     ALLERGIES:  is allergic to codeine; decadron; other; and zoloft.  MEDICATIONS:  Current Outpatient Prescriptions  Medication Sig Dispense Refill  . acetaminophen (  TYLENOL) 325 MG tablet Take 650 mg by mouth every 6 (six) hours as needed (For fever or headache).       Marland Kitchen albuterol (PROVENTIL HFA;VENTOLIN HFA) 108 (90 BASE) MCG/ACT inhaler Inhale 2 puffs into the lungs every 6 (six) hours as needed for wheezing or shortness of breath.       Marland Kitchen aspirin EC 81 MG tablet Take 81 mg by mouth every morning.       . calcipotriene (DOVONOX) 0.005 % cream Apply 1 application topically  daily as needed (for hands).       . Calcium Carbonate-Vitamin D (CALTRATE 600+D) 600-400 MG-UNIT per tablet Take 1 tablet by mouth every morning.      . Cholecalciferol (VITAMIN D-3) 1000 UNITS CAPS Take 1,000 Units by mouth every morning.       . clobetasol cream (TEMOVATE) 0.05 % Apply 1 application topically daily.       . clonazePAM (KLONOPIN) 0.5 MG tablet Take 0.5 mg by mouth at bedtime.      . cyanocobalamin 1000 MCG tablet Take 1,000 mcg by mouth every morning.       Marland Kitchen esomeprazole (NEXIUM) 40 MG capsule Take 40 mg by mouth daily before breakfast.      . finasteride (PROSCAR) 5 MG tablet Take 5 mg by mouth daily before breakfast.       . gabapentin (NEURONTIN) 300 MG capsule Take 600-900 mg by mouth 2 (two) times daily. He takes two capsules every morning and three capsules at bedtime.      Marland Kitchen HYDROcodone-homatropine (HYCODAN) 5-1.5 MG/5ML syrup Take 5 mLs by mouth every 6 (six) hours as needed for cough.      . lithium carbonate (LITHOBID) 300 MG CR tablet Take 300 mg by mouth at bedtime.      . Multiple Vitamin (MULTIVITAMIN WITH MINERALS) TABS tablet Take 1 tablet by mouth every morning.      Marland Kitchen OVER THE COUNTER MEDICATION Take 1 tablet by mouth 2 (two) times daily. Glucosamine Chondroitin 1500mg /1200mg       . propranolol (INDERAL) 10 MG tablet Take 10-20 mg by mouth daily as needed (For extremely stressful circumstances.).       Marland Kitchen sucralfate (CARAFATE) 1 G tablet Take 1 g by mouth 4 (four) times daily as needed (For esophageal burning after radiation treatments.).      Marland Kitchen testosterone cypionate (DEPOTESTOTERONE CYPIONATE) 100 MG/ML injection Inject 100 mg into the muscle every Friday.       . vardenafil (LEVITRA) 5 MG tablet Take 5 mg by mouth daily as needed for erectile dysfunction.        No current facility-administered medications for this visit.    SURGICAL HISTORY:  Past Surgical History  Procedure Laterality Date  . Prostate surgery  1996    TURP  . Knee arthroscopy Right  01/2007  . Pilonidal cyst excision  1960's  . Eye surgery      cataracts removed fr. both eyes, IOL in place   . Video assisted thoracoscopy (vats)/wedge resection Right 02/21/2013    Procedure: Right VIDEO ASSISTED THORACOSCOPY ,Thoracotomy with right upper lobectomy, node sampling ;  Surgeon: Delight Ovens, MD;  Location: [facility] OR;  Service: Thoracic;  Laterality: Right;  . Video bronchoscopy N/A 02/21/2013    Procedure: VIDEO BRONCHOSCOPY;  Surgeon: Delight Ovens, MD;  Location: [facility] OR;  Service: Thoracic;  Laterality: N/A;  . Cystoscopy N/A 02/21/2013    Procedure: CYSTOSCOPY FLEXIBLE with insertion of foley catheter;  Surgeon: Lowella Petties  Mena Goes, MD;  Location: [facility] OR;  Service: Urology;  Laterality: N/A;    REVIEW OF SYSTEMS:  Constitutional: negative Eyes: negative Ears, nose, mouth, throat, and face: negative Respiratory: negative Cardiovascular: negative Gastrointestinal: negative Genitourinary:negative Integument/breast: negative Hematologic/lymphatic: negative Musculoskeletal:negative Neurological: negative Behavioral/Psych: negative Endocrine: negative Allergic/Immunologic: negative   PHYSICAL EXAMINATION: General appearance: alert, cooperative and no distress Head: Normocephalic, without obvious abnormality, atraumatic Neck: no adenopathy Lymph nodes: Cervical, supraclavicular, and axillary nodes normal. Resp: clear to auscultation bilaterally Cardio: regular rate and rhythm, S1, S2 normal, no murmur, click, rub or gallop GI: soft, non-tender; bowel sounds normal; no masses,  no organomegaly Extremities: extremities normal, atraumatic, no cyanosis or edema Neurologic: Alert and oriented X 3, normal strength and tone. Normal symmetric reflexes. Normal coordination and gait  ECOG PERFORMANCE STATUS: 0 - Asymptomatic  Blood pressure 131/88, pulse 103, temperature 98.3 F (36.8 C), temperature source Oral, resp. rate 18, height 5\' 10"  (1.778 m), weight 180 lb 6.4  oz (81.829 kg), SpO2 96.00%.  LABORATORY DATA: Lab Results  Component Value Date   WBC 6.4 09/07/2013   HGB 15.4 09/07/2013   HCT 44.1 09/07/2013   MCV 91.5 09/07/2013   PLT 215 09/07/2013      Chemistry      Component Value Date/Time   NA 135 09/07/2013 1145   NA 142 06/27/2013 1108   K 5.0 09/07/2013 1145   K 4.5 06/27/2013 1108   CL 100 09/07/2013 1145   CL 107 04/24/2013 1110   CO2 23 09/07/2013 1145   CO2 23 06/27/2013 1108   BUN 15 09/07/2013 1145   BUN 12.4 06/27/2013 1108   CREATININE 0.80 09/07/2013 1145   CREATININE 0.9 06/27/2013 1108      Component Value Date/Time   CALCIUM 9.7 09/07/2013 1145   CALCIUM 9.1 06/27/2013 1108   ALKPHOS 63 09/07/2013 1145   ALKPHOS 59 06/27/2013 1108   AST 37 09/07/2013 1145   AST 32 06/27/2013 1108   ALT 28 09/07/2013 1145   ALT 47 06/27/2013 1108   BILITOT 0.9 09/07/2013 1145   BILITOT 1.28* 06/27/2013 1108       RADIOGRAPHIC STUDIES: Dg Chest 2 View  09/07/2013   CLINICAL DATA:  Chest pain, previous right upper lobectomy for lung cancer  EXAM: CHEST  2 VIEW  COMPARISON:  03/15/2013, 06/27/2013  FINDINGS: Chronic postop changes in the right hemi thorax with volume loss. Patient is status post right upper lobectomy. Normal heart size and vascularity. No definite superimposed pneumonia, collapse or consolidation. No effusion or pneumothorax. Stable rightward tracheal deviation. Degenerative changes of the spine.  IMPRESSION: Stable postoperative changes in the right hemi thorax. No superimposed acute process   Electronically Signed   By: Ruel Favors M.D.   On: 09/07/2013 13:00   Ct Angio Chest Pe W/cm &/or Wo Cm  09/07/2013   CLINICAL DATA:  Status post lung surgery, cough shortness of breath, hemoptysis  EXAM: CT ANGIOGRAPHY CHEST WITH CONTRAST  TECHNIQUE: Multidetector CT imaging of the chest was performed using the standard protocol during bolus administration of intravenous contrast. Multiplanar CT image reconstructions including MIPs were obtained  to evaluate the vascular anatomy.  CONTRAST:  OMNIPAQUE IOHEXOL 350 MG/ML SOLN  COMPARISON:  06/27/2013  FINDINGS: The thoracic inlet is unremarkable. The mediastinum demonstrates linearly oriented increased soft tissue density within the anterior superior right hilar region extending posteriorly. Linear areas of high density are appreciated within these findings. Considering the patient's history these findings abdominal wall appearance of postsurgical  changes. It appears stable when compared to the previous study. Residual or recurrent disease cannot be excluded. The There is no evidence of filling defects within the main, lobar, or segmental pulmonary arteries.  The lung parenchyma demonstrates diffuse airspace disease in the posterior aspect of the right upper lobe, superior segment right lower lobe. Mild increased density projects in the the base of the left lower lobe.  Visualized upper abdominal viscera grossly unremarkable. Multilevel degenerative changes are appreciated within the thoracic spine.  Review of the MIP images confirms the above findings.  IMPRESSION: 1. No CT evidence of pulmonary arterial embolic disease  2. Diffuse areas of pneumonitis within the superior segment right lower lobe and to a lesser extent at the posterior aspect of the right upper lobe differential considerations are postsurgical changes versus an infectious or inflammatory pneumonitis. Pulmonary hemorrhage considering the patient's history is a diagnostic consideration. Asymmetric edema is of much lower consideration considering the findings in the remaining portions of the lung.   Electronically Signed   By: Salome Holmes M.D.   On: 09/07/2013 14:26   ASSESSMENT AND PLAN: This is a very pleasant 78 years old white male with history of stage IIIa non-small cell lung cancer, adenocarcinoma with positive EGFR mutation status post right upper lobectomy with lymph node dissection followed by 4 cycles of adjuvant  chemotherapy. His also status post adjuvant radiotherapy to the mediastinum. The last CT scan of the chest in November of 2014 showed no evidence for disease recurrence. There was right lung diffuse airspace disease questionable for interstitial pneumonitis likely secondary to radiotherapy or infectious in nature. The patient is doing fine and he has no evidence for disease recurrence on the recent scan. I discussed the scan results with the patient and recommended for him to continue on observation with repeat CT scan of the chest in 3 months. The patient had several questions today and I answered them completely to his satisfaction. The patient was advised to call immediately if he has any concerning symptoms in the interval.  The patient voices understanding of current disease status and treatment options and is in agreement with the current care plan.  All questions were answered. The patient knows to call the clinic with any problems, questions or concerns. We can certainly see the patient much sooner if necessary.  I spent 15 minutes counseling the patient face to face. The total time spent in the appointment was 25 minutes.

## 2013-10-04 NOTE — Telephone Encounter (Signed)
Gave pt appt for lab and MD on March 2015 °

## 2013-10-05 NOTE — Patient Instructions (Signed)
CURRENT THERAPY: Observation  CHEMOTHERAPY INTENT: adjuvant/curative  CURRENT # OF CHEMOTHERAPY CYCLES: 0 CURRENT ANTIEMETICS: Zofran, Decadron and Compazine  CURRENT SMOKING STATUS: currently a nonsmoker  ORAL CHEMOTHERAPY AND CONSENT: None  CURRENT BISPHOSPHONATES USE: None  PAIN MANAGEMENT: no pain  NARCOTICS INDUCED CONSTIPATION: N/A  LIVING WILL AND CODE STATUS: Full Code.

## 2013-10-09 ENCOUNTER — Encounter: Payer: Self-pay | Admitting: Radiation Oncology

## 2013-10-09 ENCOUNTER — Encounter: Payer: Self-pay | Admitting: Cardiothoracic Surgery

## 2013-10-10 ENCOUNTER — Encounter: Payer: Self-pay | Admitting: Radiation Oncology

## 2013-10-11 ENCOUNTER — Ambulatory Visit (INDEPENDENT_AMBULATORY_CARE_PROVIDER_SITE_OTHER): Payer: Medicare Other | Admitting: Cardiothoracic Surgery

## 2013-10-11 ENCOUNTER — Encounter: Payer: Self-pay | Admitting: Radiation Oncology

## 2013-10-11 ENCOUNTER — Encounter: Payer: Self-pay | Admitting: Cardiothoracic Surgery

## 2013-10-11 ENCOUNTER — Ambulatory Visit
Admission: RE | Admit: 2013-10-11 | Discharge: 2013-10-11 | Disposition: A | Discharge status: Home / Self Care | Payer: Medicare Other | Source: Ambulatory Visit | Attending: Radiation Oncology | Admitting: Radiation Oncology

## 2013-10-11 DIAGNOSIS — C3410 Malignant neoplasm of upper lobe, unspecified bronchus or lung: Secondary | ICD-10-CM

## 2013-10-11 DIAGNOSIS — Z09 Encounter for follow-up examination after completed treatment for conditions other than malignant neoplasm: Secondary | ICD-10-CM

## 2013-10-11 HISTORY — DX: Personal history of irradiation: Z92.3

## 2013-10-11 NOTE — Progress Notes (Signed)
[address]       [place] [address]             [phone]        JUANDIEGO KOLENOVIC [facility] Medical Record #[id number] Date of Birth: 05-Oct-1935  [name], MD [name], MD  Chief Complaint:   PostOp Follow Up Visit  02/21/2013   PREOPERATIVE DIAGNOSIS: Right upper lobe lung mass suspicious for  malignancy.  POSTOPERATIVE DIAGNOSIS: Right upper lobe lung mass suspicious for  malignancy. 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  PROCEDURE PERFORMED: Bronchoscopy, right upper lobe. Bronchoscopy  right video-assisted thoracoscopy, mini thoracotomy and right upper lobe  resection with lymph node dissection and placement of On-Q device.  SURGEON: Sheliah Plane, MD   Stage III A (T2 A., N2, M0) non-small cell lung cancer, adenocarcinoma with positive EGFR mutation in exon 19 and negative ALK gene translocation diagnosed in April 2014   Treatment :  1) Status post right upper lobectomy with lymph node dissection   02/21/2013.  2) Systemic adjuvant chemotherapy with carboplatin for AUC of 5 and Alimta 500 mg/M2 every 3 weeks. Status post 4 cycles. Last cycle was given on 05/29/2013. (Carboplatin was used in place of cisplatin secondary to concern about intolerability and significant adverse effects of cisplatin). Dr Arbutus Ped 3) Adjuvant radiotherapy to the mediastinum under the care of Dr. Mitzi Hansen completed 08/27/2013.      History of Present Illness:     Patient returns now 8 months postoperatively. He notes that the month of November was bad, early in the month he noted increasing dyspnea, intermittent fever, dry cough and fatigue. A Ct angiogram of the chest performed at that time and it showed no  pulmonary embolism  there was diffuse areas of pneumonitis within the superior segment right lower lobe and to a lesser extent at the posterior aspect of the right upper lobe.  He was treated with a  course of antibiotics by his primary care physician Dr. Waynard Edwards. He notes that since being on doxycycline he is noted much improvement in his symptoms. At this point he is without significant cough or dyspnea or other symptoms of infection.   History  Smoking status  . Former Smoker  . Types: Cigarettes  . Quit date: 11/01/1978  Smokeless tobacco  . Not on file       Allergies  Allergen Reactions  . Codeine Anaphylaxis  . Decadron [Dexamethasone]     Pt says it is contraindication due to the lithium he is taking  . Other     Pt. Remarks that all pain med. Make him nauseated   . Zoloft [Sertraline Hcl] Other (See Comments)    REACTION:  unknown    Current Outpatient Prescriptions  Medication Sig Dispense Refill  . acetaminophen (TYLENOL) 325 MG tablet Take 650 mg by mouth every 6 (six) hours as needed (For fever or headache).       Marland Kitchen albuterol (PROVENTIL HFA;VENTOLIN HFA) 108 (90 BASE) MCG/ACT inhaler Inhale 2 puffs into the lungs every 6 (six) hours as needed for wheezing or shortness of breath.       Marland Kitchen aspirin EC 81 MG tablet Take 81 mg by mouth every morning.       . calcipotriene (DOVONOX) 0.005 % cream Apply 1 application topically daily as needed (for  hands).       . Calcium Carbonate-Vitamin D (CALTRATE 600+D) 600-400 MG-UNIT per tablet Take 1 tablet by mouth every morning.      . Cholecalciferol (VITAMIN D-3) 1000 UNITS CAPS Take 1,000 Units by mouth every morning.       . clobetasol cream (TEMOVATE) 0.05 % Apply 1 application topically daily.       . clonazePAM (KLONOPIN) 0.5 MG tablet Take 0.5 mg by mouth at bedtime.      . cyanocobalamin 1000 MCG tablet Take 1,000 mcg by mouth every morning.       Marland Kitchen esomeprazole (NEXIUM) 40 MG capsule Take 40 mg by mouth daily before breakfast.      . finasteride (PROSCAR) 5 MG tablet Take 5 mg by mouth daily before breakfast.       . gabapentin (NEURONTIN) 300 MG capsule Take 600-900 mg by mouth 2 (two) times daily. He takes two  capsules every morning and three capsules at bedtime.      Marland Kitchen HYDROcodone-homatropine (HYCODAN) 5-1.5 MG/5ML syrup Take 5 mLs by mouth every 6 (six) hours as needed for cough.      . lithium carbonate (LITHOBID) 300 MG CR tablet Take 300 mg by mouth at bedtime.      . Multiple Vitamin (MULTIVITAMIN WITH MINERALS) TABS tablet Take 1 tablet by mouth every morning.      Marland Kitchen OVER THE COUNTER MEDICATION Take 1 tablet by mouth 2 (two) times daily. Glucosamine Chondroitin 1500mg /1200mg       . propranolol (INDERAL) 10 MG tablet Take 10-20 mg by mouth daily as needed (For extremely stressful circumstances.).       Marland Kitchen testosterone cypionate (DEPOTESTOTERONE CYPIONATE) 100 MG/ML injection Inject 100 mg into the muscle every Friday.       . vardenafil (LEVITRA) 5 MG tablet Take 5 mg by mouth daily as needed for erectile dysfunction.        No current facility-administered medications for this visit.       Physical Exam: There were no vitals taken for this visit.  General appearance: alert, cooperative and no distress Neurologic: intact Heart: regular rate and rhythm, S1, S2 normal, no murmur, click, rub or gallop and normal apical impulse Lungs: clear to auscultation bilaterally and normal percussion bilaterally Abdomen: soft, non-tender; bowel sounds normal; no masses,  no organomegaly Extremities: extremities normal, atraumatic, no cyanosis or edema and Homans sign is negative, no sign of DVT Wound: The right chest wall incisions are all well-healed   Diagnostic Studies & Laboratory data:         Recent Radiology Findings:  CLINICAL DATA: Status post lung surgery, cough shortness of breath,  hemoptysis  EXAM:  CT ANGIOGRAPHY CHEST WITH CONTRAST  TECHNIQUE:  Multidetector CT imaging of the chest was performed using the  standard protocol during bolus administration of intravenous  contrast. Multiplanar CT image reconstructions including MIPs were  obtained to evaluate the vascular anatomy.    CONTRAST: OMNIPAQUE IOHEXOL 350 MG/ML SOLN  COMPARISON: 06/27/2013  FINDINGS:  The thoracic inlet is unremarkable. The mediastinum demonstrates  linearly oriented increased soft tissue density within the anterior  superior right hilar region extending posteriorly. Linear areas of  high density are appreciated within these findings. Considering the  patient's history these findings abdominal wall appearance of  postsurgical changes. It appears stable when compared to the  previous study. Residual or recurrent disease cannot be excluded.  The There is no evidence of filling defects within the main, lobar,  or  segmental pulmonary arteries.  The lung parenchyma demonstrates diffuse airspace disease in the  posterior aspect of the right upper lobe, superior segment right  lower lobe. Mild increased density projects in the the base of the  left lower lobe.  Visualized upper abdominal viscera grossly unremarkable. Multilevel  degenerative changes are appreciated within the thoracic spine.  Review of the MIP images confirms the above findings.  IMPRESSION:  1. No CT evidence of pulmonary arterial embolic disease  2. Diffuse areas of pneumonitis within the superior segment right  lower lobe and to a lesser extent at the posterior aspect of the  right upper lobe differential considerations are postsurgical  changes versus an infectious or inflammatory pneumonitis. Pulmonary  hemorrhage considering the patient's history is a diagnostic  consideration. Asymmetric edema is of much lower consideration  considering the findings in the remaining portions of the lung.  Electronically Signed  By: Salome Holmes M.D.  On: 09/07/2013 14:26   Ct Chest W Contrast  06/27/2013   *RADIOLOGY REPORT*  Clinical Data: Staging lung cancer  CT CHEST WITH CONTRAST  Technique:  Multidetector CT imaging of the chest was performed following the standard protocol during bolus administration of intravenous  contrast.  Contrast: 80mL OMNIPAQUE IOHEXOL 300 MG/ML  SOLN  Comparison: 02/14/2013  Findings: No pleural effusion identified.  The patient is status post right upper lobectomy. There is a new sub solid density within the right lung base measuring 1.1 x 1.8 cm, image 41/series 6. This is new from the previous exam.  Within the left lower lobe there is a 5 mm nodule, image 46/series 5.  Heart size appears normal.  There is no mediastinal or hilar adenopathy identified.  There is no enlarged axillary or supraclavicular adenopathy.  Limited imaging through the upper abdomen shows bilateral renal cysts.  No acute findings identified.  No mass or adenopathy.  Review of the visualized bony structures is significant for mild scoliosis and multilevel spondylosis.  IMPRESSION:  1.  No acute cardiopulmonary abnormalities. 2.  Postoperative changes from right upper lobectomy. 3.  New sub solid density within the right lower lobe is nonspecific and may be treatment related.  Recommend follow-up imaging in 3 months to confirm persistence.   Original Report Authenticated By: Signa Kell, M.D.    Recent Labs: Lab Results  Component Value Date   WBC 6.7 10/04/2013   HGB 15.7 10/04/2013   HCT 46.7 10/04/2013   PLT 213 10/04/2013   GLUCOSE 102 10/04/2013   ALT 21 10/04/2013   AST 21 10/04/2013   NA 141 10/04/2013   K 4.7 10/04/2013   CL 100 09/07/2013   CREATININE 1.1 10/04/2013   BUN 13.1 10/04/2013   CO2 24 10/04/2013   INR 0.99 02/20/2013   PATH: stage IIIA,  EGFR: Exon 19 deletion  Mutation detected Diagnosis 1. Lung, resection (segmental or lobe), Right upper lobe - ADENOCARCINOMA, 3.4 CM. - MARGINS NOT INVOLVED. - VISCERAL PLEURA FREE OF TUMOR. - ONE BENIGN LYMPH NODE. 2. Lymph node, biopsy, Right upper 4 R - METASTATIC ADENOCARCINOMA. 3. Lymph node, biopsy, Right 2 R - ANTHRACOTIC LYMPH NODE. NO TUMOR IDENTIFIED. Microscopic Comment 1. LUNG Specimen, including laterality: Right lung lobe. Procedure:  Lobectomy. Specimen integrity (intact/disrupted): Intact. Tumor site: Right upper lobe. Tumor focality: Unifocal Maximum tumor size (cm): 3.4 cm. Histologic type: Adenocarcinoma, bronchioalveolar cell type. Grade: I. Margins: Free of tumor. Visceral pleura invasion: No. Tumor extension: Within lobe. Treatment effect (if treated with neoadjuvant therapy): No. Lymph -Vascular invasion:  Present. Lymph nodes: Number examined - 3 ; Number N1 nodes positive - 0 ; Number N2 nodes positive - 1 TNM code: pT2a , pN2, pMX Ancillary studies: EGFR and ALK pending. Non-neoplastic lung: Unremarkable     Assessment / Plan:    Stable following resection of right upper lobe, lung cancer which was found to be EGFR positive adenocarcinoma stage IIIa. Patient has tolerated chemotherapy well, the infiltrative process in right lung could be related to radiation but timing in position make it more likely infections in origin. With the patient improving symptoms, I will plan to see him back in March with a followup CT scan part he ordered by Dr. Arbutus Ped for early March, the patient's aware should he have any recurrent symptoms to seek attention immediately and a followup CT scan can be performed earlier.      Tabria Steines B 10/11/2013 10:17 AM

## 2013-10-11 NOTE — Progress Notes (Signed)
Follow up s/p rad tx lung , txs 07/24/13-08/27/13 Seen by Dr. Basilio Cairo 09/07/13,  Sent to ED for 6 hours ct scan done patient her to discuss this, coughing dry cough, c/o when taking deep breath pressure happens in his chest,  Hs albuterol inhaler given , appetite back to normal, had the flu also,  Weak fatigued 10:12 AM

## 2013-10-12 NOTE — Progress Notes (Signed)
Radiation Oncology         [phone] ________________________________  Name: Alvin Hardy MRN: [id number]  Date: 10/11/2013  DOB: December 07, 1934  Follow-Up Visit Note  CC: [name], MD  Delight Ovens, MD  Referring physician:  [name], MD  Diagnosis:   Non-small cell lung cancer, stage IIIa  Interval Since Last Radiation:  The patient completed stereotactic body radiation treatment on 08/27/2013   Narrative:  The patient returns today for  follow-up.  The patient states that he did not feel well through much of November. He was seen shortly after treatment by Dr. Basilio Cairo and was sent to the emergency room where he underwent a CT angiogram of the chest. The patient's report did note pneumonitis within the chest and the patient had a number of questions about this. We discussed this in detail, both the radiographic findings as well as the clinical findings suggestive of pneumonitis. We also discussed the typical timeframe.            Overall the patient has been doing better. No significant complaints of esophagitis at this time.    he states that he had the flu back in November. He has been struggling with a cough but this has improved some over the last several days. Still present however.               ALLERGIES:  is allergic to codeine; decadron; other; and zoloft.  Meds: Current Outpatient Prescriptions  Medication Sig Dispense Refill  . aspirin EC 81 MG tablet Take 81 mg by mouth every morning.       . calcipotriene (DOVONOX) 0.005 % cream Apply 1 application topically daily as needed (for hands).       . Calcium Carbonate-Vitamin D (CALTRATE 600+D) 600-400 MG-UNIT per tablet Take 1 tablet by mouth every morning.      . Cholecalciferol (VITAMIN D-3) 1000 UNITS CAPS Take 1,000 Units by mouth every morning.       . clobetasol cream (TEMOVATE) 0.05 % Apply 1 application topically daily.       . clonazePAM (KLONOPIN) 0.5 MG tablet Take 0.5 mg by mouth at bedtime.        . cyanocobalamin 1000 MCG tablet Take 1,000 mcg by mouth every morning.       Marland Kitchen esomeprazole (NEXIUM) 40 MG capsule Take 40 mg by mouth daily before breakfast.      . finasteride (PROSCAR) 5 MG tablet Take 5 mg by mouth daily before breakfast.       . gabapentin (NEURONTIN) 300 MG capsule Take 600-900 mg by mouth 2 (two) times daily. He takes two capsules every morning and three capsules at bedtime.      Marland Kitchen lithium carbonate (LITHOBID) 300 MG CR tablet Take 300 mg by mouth at bedtime.      . Multiple Vitamin (MULTIVITAMIN WITH MINERALS) TABS tablet Take 1 tablet by mouth every morning.      Marland Kitchen OVER THE COUNTER MEDICATION Take 1 tablet by mouth 2 (two) times daily. Glucosamine Chondroitin 1500mg /1200mg       . propranolol (INDERAL) 10 MG tablet Take 10-20 mg by mouth daily as needed (For extremely stressful circumstances.).       Marland Kitchen testosterone cypionate (DEPOTESTOTERONE CYPIONATE) 100 MG/ML injection Inject 100 mg into the muscle every Friday.       . vardenafil (LEVITRA) 5 MG tablet Take 5 mg by mouth daily as needed for erectile dysfunction.       Marland Kitchen acetaminophen (TYLENOL)  325 MG tablet Take 650 mg by mouth every 6 (six) hours as needed (For fever or headache).       Marland Kitchen albuterol (PROVENTIL HFA;VENTOLIN HFA) 108 (90 BASE) MCG/ACT inhaler Inhale 2 puffs into the lungs every 6 (six) hours as needed for wheezing or shortness of breath.       No current facility-administered medications for this encounter.    Physical Findings: The patient is in no acute distress. Patient is alert and oriented.  weight is 183 lb 4.8 oz (83.144 kg). His oral temperature is 98.4 F (36.9 C). His blood pressure is 132/72 and his pulse is 110. His respiration is 22 and oxygen saturation is 97%. .   General: Well-developed, in no acute distress HEENT: Normocephalic, atraumatic Cardiovascular: Regular rate and rhythm Respiratory: Clear to auscultation bilaterally GI: Soft, nontender, normal bowel sounds Extremities:  No edema present   Lab Findings: Lab Results  Component Value Date   WBC 6.7 10/04/2013   HGB 15.7 10/04/2013   HCT 46.7 10/04/2013   MCV 91.6 10/04/2013   PLT 213 10/04/2013     Radiographic Findings: No results found.  Impression:    The patient clinically is recovering from his illness from several weeks ago to one month ago. I discussed with them that the clinical suspicion for radiation pneumonitis, symptomatic, is still very low I believe given his improvement recently and his overall picture. He is aware to contact our off this if he has further difficulties. He is proceeding with ongoing followup with Dr. Shirline Frees in medical oncology with repeat imaging in several months.  Plan:  We will have him return to our clinic in 6 months for ongoing followup.   Radene Gunning, M.D., Ph.D.

## 2013-10-17 ENCOUNTER — Telehealth: Payer: Self-pay | Admitting: Internal Medicine

## 2013-10-17 NOTE — Telephone Encounter (Signed)
returned pt call and s/w pt to r/s March appt to later time...done pt ok and aware

## 2013-11-20 ENCOUNTER — Ambulatory Visit (INDEPENDENT_AMBULATORY_CARE_PROVIDER_SITE_OTHER): Payer: Medicare Other | Admitting: Psychiatry

## 2013-11-20 DIAGNOSIS — Z638 Other specified problems related to primary support group: Secondary | ICD-10-CM

## 2013-11-20 DIAGNOSIS — F0630 Mood disorder due to known physiological condition, unspecified: Secondary | ICD-10-CM

## 2013-12-27 ENCOUNTER — Telehealth: Payer: Self-pay | Admitting: *Deleted

## 2013-12-27 NOTE — Telephone Encounter (Signed)
Pt called left me voice message regarding questions about appt.  I called back and spoke with pt.  I clarified appt's.  He verbalized understanding

## 2013-12-31 ENCOUNTER — Encounter (HOSPITAL_COMMUNITY): Payer: Self-pay

## 2014-01-01 ENCOUNTER — Other Ambulatory Visit (HOSPITAL_BASED_OUTPATIENT_CLINIC_OR_DEPARTMENT_OTHER): Payer: Federal, State, Local not specified - PPO

## 2014-01-01 ENCOUNTER — Other Ambulatory Visit: Payer: Self-pay | Admitting: Internal Medicine

## 2014-01-01 ENCOUNTER — Encounter (HOSPITAL_COMMUNITY): Payer: Self-pay

## 2014-01-01 ENCOUNTER — Ambulatory Visit (HOSPITAL_COMMUNITY)
Admission: RE | Admit: 2014-01-01 | Discharge: 2014-01-01 | Disposition: A | Discharge status: Home / Self Care | Payer: Federal, State, Local not specified - PPO | Source: Ambulatory Visit | Attending: Internal Medicine | Admitting: Internal Medicine

## 2014-01-01 DIAGNOSIS — J90 Pleural effusion, not elsewhere classified: Secondary | ICD-10-CM | POA: Insufficient documentation

## 2014-01-01 DIAGNOSIS — C3490 Malignant neoplasm of unspecified part of unspecified bronchus or lung: Secondary | ICD-10-CM | POA: Insufficient documentation

## 2014-01-01 DIAGNOSIS — C3410 Malignant neoplasm of upper lobe, unspecified bronchus or lung: Secondary | ICD-10-CM

## 2014-01-01 LAB — CBC WITH DIFFERENTIAL/PLATELET
BASO%: 0.6 % (ref 0.0–2.0)
Basophils Absolute: 0 10*3/uL (ref 0.0–0.1)
EOS%: 2.6 % (ref 0.0–7.0)
Eosinophils Absolute: 0.2 10*3/uL (ref 0.0–0.5)
HCT: 47.4 % (ref 38.4–49.9)
HGB: 15.7 g/dL (ref 13.0–17.1)
LYMPH%: 22.8 % (ref 14.0–49.0)
MCH: 28.5 pg (ref 27.2–33.4)
MCHC: 33.1 g/dL (ref 32.0–36.0)
MCV: 86 fL (ref 79.3–98.0)
MONO#: 0.7 10*3/uL (ref 0.1–0.9)
MONO%: 10.8 % (ref 0.0–14.0)
NEUT#: 4.3 10*3/uL (ref 1.5–6.5)
NEUT%: 63.2 % (ref 39.0–75.0)
Platelets: 197 10*3/uL (ref 140–400)
RBC: 5.51 10*6/uL (ref 4.20–5.82)
RDW: 13.1 % (ref 11.0–14.6)
WBC: 6.8 10*3/uL (ref 4.0–10.3)
lymph#: 1.6 10*3/uL (ref 0.9–3.3)

## 2014-01-01 LAB — COMPREHENSIVE METABOLIC PANEL (CC13)
ALT: 21 U/L (ref 0–55)
AST: 19 U/L (ref 5–34)
Albumin: 4.3 g/dL (ref 3.5–5.0)
Alkaline Phosphatase: 65 U/L (ref 40–150)
Anion Gap: 8 mEq/L (ref 3–11)
BUN: 13.9 mg/dL (ref 7.0–26.0)
CALCIUM: 9.9 mg/dL (ref 8.4–10.4)
CHLORIDE: 106 meq/L (ref 98–109)
CO2: 27 mEq/L (ref 22–29)
CREATININE: 1 mg/dL (ref 0.7–1.3)
Glucose: 93 mg/dl (ref 70–140)
POTASSIUM: 5.1 meq/L (ref 3.5–5.1)
SODIUM: 141 meq/L (ref 136–145)
TOTAL PROTEIN: 7.1 g/dL (ref 6.4–8.3)
Total Bilirubin: 1.37 mg/dL — ABNORMAL HIGH (ref 0.20–1.20)

## 2014-01-01 MED ORDER — IOHEXOL 300 MG/ML  SOLN
80.0000 mL | Freq: Once | INTRAMUSCULAR | Status: AC | PRN
  Administered 2014-01-01: 80 mL via INTRAVENOUS

## 2014-01-02 ENCOUNTER — Telehealth: Payer: Self-pay | Admitting: *Deleted

## 2014-01-02 ENCOUNTER — Ambulatory Visit: Payer: Federal, State, Local not specified - PPO | Admitting: Internal Medicine

## 2014-01-02 ENCOUNTER — Encounter: Payer: Self-pay | Admitting: Internal Medicine

## 2014-01-02 ENCOUNTER — Ambulatory Visit (HOSPITAL_BASED_OUTPATIENT_CLINIC_OR_DEPARTMENT_OTHER): Payer: Medicare Other | Admitting: Internal Medicine

## 2014-01-02 VITALS — BP 130/78 | HR 98 | Temp 97.8°F | Resp 18 | Ht 70.5 in | Wt 184.0 lb

## 2014-01-02 DIAGNOSIS — Z85118 Personal history of other malignant neoplasm of bronchus and lung: Secondary | ICD-10-CM

## 2014-01-02 DIAGNOSIS — R6889 Other general symptoms and signs: Secondary | ICD-10-CM

## 2014-01-02 DIAGNOSIS — C3410 Malignant neoplasm of upper lobe, unspecified bronchus or lung: Secondary | ICD-10-CM

## 2014-01-02 NOTE — Telephone Encounter (Signed)
appts made printed. Pt is aware that cs will call w/ appt for CT Chest...td

## 2014-01-02 NOTE — Progress Notes (Signed)
[place] [phone]   [phone]  OFFICE PROGRESS NOTE  Jerlyn Ly, MD [place] [address]  DIAGNOSIS AND STAGE: Stage III A (T2 A., N2, M0) non-small cell lung cancer, adenocarcinoma with positive EGFR mutation in exon 19 and negative ALK gene translocation diagnosed in April 2014   PRIOR THERAPY:  1) Status post right upper lobectomy with lymph node dissection under the care of Dr. Servando Snare on 02/21/2013. 2) Systemic adjuvant chemotherapy with carboplatin for AUC of 5 and Alimta 500 mg/M2 every 3 weeks. Status post 4 cycles. Last cycle was given on 05/29/2013. (Carboplatin was used in place of cisplatin secondary to concern about intolerability and significant adverse effects of cisplatin).  3) Adjuvant radiotherapy to the mediastinum under the care of Dr. Lisbeth Renshaw completed 08/27/2013.   CURRENT THERAPY: Observation  CHEMOTHERAPY INTENT: adjuvant/curative  CURRENT # OF CHEMOTHERAPY CYCLES: 0 CURRENT ANTIEMETICS: Zofran, Decadron and Compazine  CURRENT SMOKING STATUS: currently a nonsmoker  ORAL CHEMOTHERAPY AND CONSENT: None  CURRENT BISPHOSPHONATES USE: None  PAIN MANAGEMENT: no pain  NARCOTICS INDUCED CONSTIPATION: N/A  LIVING WILL AND CODE STATUS: Full Code.   INTERVAL HISTORY: Alvin Hardy 79 y.o. male returns to the clinic today for followup visit. He has occasional shortness of breath as well as ticklish sensation in his throat. He is referred by Dr. Joylene Draft to cardiology for evaluation of his persistent dyspnea and chest pain. The patient is feeling fine today with no specific complaints. The patient denied having any significant fever or chills. He has no nausea or vomiting. He denied having any significant chest pain, shortness of breath, cough or hemoptysis. He had repeat CT scan of the chest performed recently and he is here for evaluation and discussion of his scan results.  MEDICAL HISTORY: Past Medical  History  Diagnosis Date  . BPH (benign prostatic hyperplasia)   . Hypogonadism male   . Bipolar 1 disorder   . ED (erectile dysfunction)   . Hyperlipidemia   . Neuromuscular disorder     peripheral neuropathy  . Cataract 2010    Bilateral  . Laryngopharyngeal reflux   . Gilbert's syndrome   . Deviated septum     TO THE LEFT  . Morton's neuroma     LEFT FOOT  . Lung mass 02/07/13    RIGHT UPPER LOBE  . Shortness of breath     voice breaks, ? related to reflux, although told by Dr. Ardis Hughs, S.- early 18 yr. old  . GERD (gastroesophageal reflux disease)   . Arthritis     psoriatic arthritis, ? , treated /w mmethotrexate   . Finger injury     mallet finger-- 02/13/2013, cast in place, followed by dr. Fredna Dow  . Allergy   . Depression   . History of radiation therapy 07/24/13-08/27/13    50Gy/25 fx chest  . lung ca dx'd 12/2012    rul    ALLERGIES:  is allergic to codeine; decadron; other; and zoloft.  MEDICATIONS:  Current Outpatient Prescriptions  Medication Sig Dispense Refill  . acetaminophen (TYLENOL) 325 MG tablet Take 650 mg by mouth every 6 (six) hours as needed (For fever or headache).       Marland Kitchen aspirin EC 81 MG tablet Take 81 mg by mouth every morning.       . calcipotriene (DOVONOX) 0.005 % cream Apply 1 application topically daily as needed (for hands).       . Calcium Carbonate-Vitamin D (  CALTRATE 600+D) 600-400 MG-UNIT per tablet Take 1 tablet by mouth every morning.      . Cholecalciferol (VITAMIN D-3) 1000 UNITS CAPS Take 1,000 Units by mouth every morning.       . clobetasol cream (TEMOVATE) 2.92 % Apply 1 application topically daily.       . clonazePAM (KLONOPIN) 0.5 MG tablet Take 0.5 mg by mouth at bedtime.      . cyanocobalamin 1000 MCG tablet Take 1,000 mcg by mouth every morning.       Marland Kitchen esomeprazole (NEXIUM) 40 MG capsule Take 40 mg by mouth daily before breakfast.      . finasteride (PROSCAR) 5 MG tablet Take 5 mg by mouth daily before breakfast.       .  gabapentin (NEURONTIN) 300 MG capsule Take 600-900 mg by mouth 2 (two) times daily. He takes two capsules every morning and three capsules at bedtime.      Marland Kitchen lithium carbonate (LITHOBID) 300 MG CR tablet Take 300 mg by mouth at bedtime.      . Multiple Vitamin (MULTIVITAMIN WITH MINERALS) TABS tablet Take 1 tablet by mouth every morning.      Marland Kitchen OVER THE COUNTER MEDICATION Take 1 tablet by mouth 2 (two) times daily. Glucosamine Chondroitin 1552m/1200mg      . propranolol (INDERAL) 10 MG tablet Take 10-20 mg by mouth daily as needed (For extremely stressful circumstances.).       .Marland Kitchentestosterone cypionate (DEPOTESTOTERONE CYPIONATE) 100 MG/ML injection Inject 100 mg into the muscle every Friday.       . vardenafil (LEVITRA) 5 MG tablet Take 5 mg by mouth daily as needed for erectile dysfunction.       .Marland Kitchenalbuterol (PROVENTIL HFA;VENTOLIN HFA) 108 (90 BASE) MCG/ACT inhaler Inhale 2 puffs into the lungs every 6 (six) hours as needed for wheezing or shortness of breath.       No current facility-administered medications for this visit.    SURGICAL HISTORY:  Past Surgical History  Procedure Laterality Date  . Prostate surgery  1996    TURP  . Knee arthroscopy Right 01/2007  . Pilonidal cyst excision  1960's  . Eye surgery      cataracts removed fr. both eyes, IOL in place   . Video assisted thoracoscopy (vats)/wedge resection Right 02/21/2013    Procedure: Right VIDEO ASSISTED THORACOSCOPY ,Thoracotomy with right upper lobectomy, node sampling ;  Surgeon: EGrace Isaac MD;  [facility]  Service: Thoracic;  Laterality: Right;  . Video bronchoscopy N/A 02/21/2013    Procedure: VIDEO BRONCHOSCOPY;  Surgeon: EGrace Isaac MD;  Location: [facility]  Service: Thoracic;  Laterality: N/A;  . Cystoscopy N/A 02/21/2013    Procedure: CYSTOSCOPY FLEXIBLE with insertion of foley catheter;  Surgeon: MFredricka Bonine MD;  Location[facility]  Service: Urology;  Laterality: N/A;    REVIEW OF  SYSTEMS:  Constitutional: negative Eyes: negative Ears, nose, mouth, throat, and face: negative Respiratory: negative Cardiovascular: negative Gastrointestinal: negative Genitourinary:negative Integument/breast: negative Hematologic/lymphatic: negative Musculoskeletal:negative Neurological: negative Behavioral/Psych: negative Endocrine: negative Allergic/Immunologic: negative   PHYSICAL EXAMINATION: General appearance: alert, cooperative and no distress Head: Normocephalic, without obvious abnormality, atraumatic Neck: no adenopathy Lymph nodes: Cervical, supraclavicular, and axillary nodes normal. Resp: clear to auscultation bilaterally Cardio: regular rate and rhythm, S1, S2 normal, no murmur, click, rub or gallop GI: soft, non-tender; bowel sounds normal; no masses,  no organomegaly Extremities: extremities normal, atraumatic, no cyanosis or edema Neurologic: Alert and oriented X 3,  normal strength and tone. Normal symmetric reflexes. Normal coordination and gait  ECOG PERFORMANCE STATUS: 0 - Asymptomatic  Blood pressure 130/78, pulse 98, temperature 97.8 F (36.6 C), temperature source Oral, resp. rate 18, height 5' 10.5" (1.791 m), weight 184 lb (83.462 kg), SpO2 98.00%.  LABORATORY DATA: Lab Results  Component Value Date   WBC 6.8 01/01/2014   HGB 15.7 01/01/2014   HCT 47.4 01/01/2014   MCV 86.0 01/01/2014   PLT 197 01/01/2014      Chemistry      Component Value Date/Time   NA 141 01/01/2014 1118   NA 135 09/07/2013 1145   K 5.1 01/01/2014 1118   K 5.0 09/07/2013 1145   CL 100 09/07/2013 1145   CL 107 04/24/2013 1110   CO2 27 01/01/2014 1118   CO2 23 09/07/2013 1145   BUN 13.9 01/01/2014 1118   BUN 15 09/07/2013 1145   CREATININE 1.0 01/01/2014 1118   CREATININE 0.80 09/07/2013 1145      Component Value Date/Time   CALCIUM 9.9 01/01/2014 1118   CALCIUM 9.7 09/07/2013 1145   ALKPHOS 65 01/01/2014 1118   ALKPHOS 63 09/07/2013 1145   AST 19 01/01/2014 1118   AST 37 09/07/2013 1145   ALT  21 01/01/2014 1118   ALT 28 09/07/2013 1145   BILITOT 1.37* 01/01/2014 1118   BILITOT 0.9 09/07/2013 1145       RADIOGRAPHIC STUDIES: Ct Chest W Contrast  01/01/2014   CLINICAL DATA:  Lung cancer restaging  EXAM: CT CHEST WITH CONTRAST  TECHNIQUE: Multidetector CT imaging of the chest was performed during intravenous contrast administration.  CONTRAST:  80 cc of Omni  COMPARISON:  09/07/2013  FINDINGS: The heart size appears normal. No pericardial effusion. There is no mediastinal or hilar adenopathy. No axillary or supraclavicular adenopathy noted.  Small right pleural effusion is identified and is new from previous exam. Postoperative change and volume loss is identified involving the right hemi thorax. There are progressive radiation changes noted within the right lung. No specific features are identified to suggest local tumor recurrence.  No axillary or supraclavicular adenopathy. Related through the upper abdomen is on unremarkable. There are no acute findings identified. The adrenal glands both appear normal. Stable cyst within the upper pole of the left kidney measuring 2.4 cm, image 64/series 2.  Review of the visualized osseous structures is significant for mild scoliosis and multilevel spondylosis involving the thoracic spine.  IMPRESSION: 1. Progressive changes of external beam radiation are identified within the right lung. 2. No evidence for residual or recurrence of tumor.   Electronically Signed   By: Kerby Moors M.D.   On: 01/01/2014 14:04   ASSESSMENT AND PLAN: This is a very pleasant 79 years old white male with history of stage IIIa non-small cell lung cancer, adenocarcinoma with positive EGFR mutation status post right upper lobectomy with lymph node dissection followed by 4 cycles of adjuvant chemotherapy. His also status post adjuvant radiotherapy to the mediastinum. His recent scan showed no evidence for disease recurrence but progressive changes of external beam radiation. I  discussed the scan results with the patient and recommended for him to continue on observation with repeat CT scan of the chest in 3 months. For the ticklish sensation in his throat advice the patient to see his ENT physician for evaluation. He was seen in the past by Dr. Alfonzo Beers with [facility]. The patient was advised to call immediately if he has any concerning symptoms in the  interval.  The patient voices understanding of current disease status and treatment options and is in agreement with the current care plan.  All questions were answered. The patient knows to call the clinic with any problems, questions or concerns. We can certainly see the patient much sooner if necessary.  Disclaimer: This note was dictated with voice recognition software. Similar sounding words can inadvertently be transcribed and may not be corrected upon review.

## 2014-01-03 ENCOUNTER — Encounter: Payer: Self-pay | Admitting: Cardiothoracic Surgery

## 2014-01-03 ENCOUNTER — Ambulatory Visit (INDEPENDENT_AMBULATORY_CARE_PROVIDER_SITE_OTHER): Payer: Medicare Other | Admitting: Cardiothoracic Surgery

## 2014-01-03 VITALS — BP 138/78 | HR 94 | Resp 16 | Ht 70.0 in | Wt 180.0 lb

## 2014-01-03 DIAGNOSIS — Z09 Encounter for follow-up examination after completed treatment for conditions other than malignant neoplasm: Secondary | ICD-10-CM

## 2014-01-03 DIAGNOSIS — C3410 Malignant neoplasm of upper lobe, unspecified bronchus or lung: Secondary | ICD-10-CM

## 2014-01-03 NOTE — Progress Notes (Signed)
[address]       [place], [address]             [phone]                 [facility] [place] Medical Record #[id number] Date of Birth: 1935/08/07  Jerlyn Ly, MD Jerlyn Ly, MD  Chief Complaint:   PostOp Follow Up Visit  02/21/2013   PREOPERATIVE DIAGNOSIS: Right upper lobe lung mass suspicious for  malignancy.  POSTOPERATIVE DIAGNOSIS: Right upper lobe lung mass suspicious for  malignancy. 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  PROCEDURE PERFORMED: Bronchoscopy, right upper lobe. Bronchoscopy  right video-assisted thoracoscopy, mini thoracotomy and right upper lobe  resection with lymph node dissection and placement of On-Q device.  SURGEON: Lanelle Bal, MD   Stage III A (T2 A., N2, M0) non-small cell lung cancer, adenocarcinoma with positive EGFR mutation in exon 19 and negative ALK gene translocation diagnosed in April 2014   Treatment :  1) Status post right upper lobectomy with lymph node dissection   02/21/2013.  2) Systemic adjuvant chemotherapy with carboplatin for AUC of 5 and Alimta 500 mg/M2 every 3 weeks. Status post 4 cycles. Last cycle was given on 05/29/2013. (Carboplatin was used in place of cisplatin secondary to concern about intolerability and significant adverse effects of cisplatin). Dr Julien Nordmann 3) Adjuvant radiotherapy to the mediastinum under the care of Dr. Lisbeth Renshaw completed 08/27/2013.      History of Present Illness:     Patient returns now 10 months postoperatively. The patient notes significant improvement in his overall symptoms since November. He is returning to near-normal activities. Does have some mild shortness of breath with exertion. Denies any hemoptysis. Does occasionally have expiratory wheeze and spontaneously clears. Recent CT scan was performed and reviewed with the patient   History  Smoking status  . Former Smoker  . Types: Cigarettes  . Quit date:  11/01/1978  Smokeless tobacco  . Not on file       Allergies  Allergen Reactions  . Codeine Anaphylaxis  . Decadron [Dexamethasone]     Pt says it is contraindication due to the lithium he is taking  . Other     Pt. Remarks that all pain med. Make him nauseated   . Zoloft [Sertraline Hcl] Other (See Comments)    REACTION:  unknown    Current Outpatient Prescriptions  Medication Sig Dispense Refill  . acetaminophen (TYLENOL) 325 MG tablet Take 650 mg by mouth every 6 (six) hours as needed (For fever or headache).       Marland Kitchen aspirin EC 81 MG tablet Take 81 mg by mouth every morning.       . calcipotriene (DOVONOX) 0.005 % cream Apply 1 application topically daily as needed (for hands).       . Calcium Carbonate-Vitamin D (CALTRATE 600+D) 600-400 MG-UNIT per tablet Take 1 tablet by mouth every morning.      . Cholecalciferol (VITAMIN D-3) 1000 UNITS CAPS Take 1,000 Units by mouth every morning.       . clobetasol cream (TEMOVATE) 3.00 % Apply 1 application topically daily.       . clonazePAM (KLONOPIN) 0.5 MG tablet Take 0.5 mg by mouth at bedtime.      . cyanocobalamin 1000 MCG tablet Take 1,000 mcg by mouth every morning.       Marland Kitchen esomeprazole (NEXIUM) 40 MG capsule Take  40 mg by mouth daily before breakfast.      . finasteride (PROSCAR) 5 MG tablet Take 5 mg by mouth daily before breakfast.       . gabapentin (NEURONTIN) 300 MG capsule Take 600-900 mg by mouth 2 (two) times daily. He takes two capsules every morning and three capsules at bedtime.      Marland Kitchen lithium carbonate (LITHOBID) 300 MG CR tablet Take 300 mg by mouth at bedtime.      . Multiple Vitamin (MULTIVITAMIN WITH MINERALS) TABS tablet Take 1 tablet by mouth every morning.      Marland Kitchen OVER THE COUNTER MEDICATION Take 1 tablet by mouth 2 (two) times daily. Glucosamine Chondroitin 1584m/1200mg      . propranolol (INDERAL) 10 MG tablet Take 10-20 mg by mouth daily as needed (For extremely stressful circumstances.).       .Marland Kitchen testosterone cypionate (DEPOTESTOTERONE CYPIONATE) 100 MG/ML injection Inject 100 mg into the muscle every Friday.       . vardenafil (LEVITRA) 5 MG tablet Take 5 mg by mouth daily as needed for erectile dysfunction.        No current facility-administered medications for this visit.       Physical Exam: BP 138/78  Pulse 94  Resp 16  Ht '5\' 10"'  (1.778 m)  Wt 180 lb (81.647 kg)  BMI 25.83 kg/m2  SpO2 98%  Wt Readings from Last 3 Encounters:  01/03/14 180 lb (81.647 kg)  01/02/14 184 lb (83.462 kg)  10/11/13 183 lb (83.008 kg)   General appearance: alert, cooperative and no distress Neurologic: intact Heart: regular rate and rhythm, S1, S2 normal, no murmur, click, rub or gallop and normal apical impulse Lungs: clear to auscultation bilaterally and normal percussion bilaterally Abdomen: soft, non-tender; bowel sounds normal; no masses,  no organomegaly Extremities: extremities normal, atraumatic, no cyanosis or edema and Homans sign is negative, no sign of DVT Wound: The right chest wall incisions are all well-healed Patient has no cervical or supraclavicular adenopathy, no carotid bruits no stridor on exam  Diagnostic Studies & Laboratory data:         Recent Radiology Findings: Ct Chest W Contrast  01/01/2014   CLINICAL DATA:  Lung cancer restaging  EXAM: CT CHEST WITH CONTRAST  TECHNIQUE: Multidetector CT imaging of the chest was performed during intravenous contrast administration.  CONTRAST:  80 cc of Omni  COMPARISON:  09/07/2013  FINDINGS: The heart size appears normal. No pericardial effusion. There is no mediastinal or hilar adenopathy. No axillary or supraclavicular adenopathy noted.  Small right pleural effusion is identified and is new from previous exam. Postoperative change and volume loss is identified involving the right hemi thorax. There are progressive radiation changes noted within the right lung. No specific features are identified to suggest local tumor  recurrence.  No axillary or supraclavicular adenopathy. Related through the upper abdomen is on unremarkable. There are no acute findings identified. The adrenal glands both appear normal. Stable cyst within the upper pole of the left kidney measuring 2.4 cm, image 64/series 2.  Review of the visualized osseous structures is significant for mild scoliosis and multilevel spondylosis involving the thoracic spine.  IMPRESSION: 1. Progressive changes of external beam radiation are identified within the right lung. 2. No evidence for residual or recurrence of tumor.   Electronically Signed   By: TKerby MoorsM.D.   On: 01/01/2014 14:04   CLINICAL DATA: Status post lung surgery, cough shortness of breath,  hemoptysis  EXAM:  CT ANGIOGRAPHY CHEST WITH CONTRAST  TECHNIQUE:  Multidetector CT imaging of the chest was performed using the  standard protocol during bolus administration of intravenous  contrast. Multiplanar CT image reconstructions including MIPs were  obtained to evaluate the vascular anatomy.  CONTRAST: 150m OMNIPAQUE IOHEXOL 350 MG/ML SOLN  COMPARISON: 06/27/2013  FINDINGS:  The thoracic inlet is unremarkable. The mediastinum demonstrates  linearly oriented increased soft tissue density within the anterior  superior right hilar region extending posteriorly. Linear areas of  high density are appreciated within these findings. Considering the  patient's history these findings abdominal wall appearance of  postsurgical changes. It appears stable when compared to the  previous study. Residual or recurrent disease cannot be excluded.  The There is no evidence of filling defects within the main, lobar,  or segmental pulmonary arteries.  The lung parenchyma demonstrates diffuse airspace disease in the  posterior aspect of the right upper lobe, superior segment right  lower lobe. Mild increased density projects in the the base of the  left lower lobe.  Visualized upper abdominal viscera  grossly unremarkable. Multilevel  degenerative changes are appreciated within the thoracic spine.  Review of the MIP images confirms the above findings.  IMPRESSION:  1. No CT evidence of pulmonary arterial embolic disease  2. Diffuse areas of pneumonitis within the superior segment right  lower lobe and to a lesser extent at the posterior aspect of the  right upper lobe differential considerations are postsurgical  changes versus an infectious or inflammatory pneumonitis. Pulmonary  hemorrhage considering the patient's history is a diagnostic  consideration. Asymmetric edema is of much lower consideration  considering the findings in the remaining portions of the lung.  Electronically Signed  By: HMargaree MackintoshM.D.  On: 09/07/2013 14:26   Ct Chest W Contrast  06/27/2013   *RADIOLOGY REPORT*  Clinical Data: Staging lung cancer  CT CHEST WITH CONTRAST  Technique:  Multidetector CT imaging of the chest was performed following the standard protocol during bolus administration of intravenous contrast.  Contrast: 89mOMNIPAQUE IOHEXOL 300 MG/ML  SOLN  Comparison: 02/14/2013  Findings: No pleural effusion identified.  The patient is status post right upper lobectomy. There is a new sub solid density within the right lung base measuring 1.1 x 1.8 cm, image 41/series 6. This is new from the previous exam.  Within the left lower lobe there is a 5 mm nodule, image 46/series 5.  Heart size appears normal.  There is no mediastinal or hilar adenopathy identified.  There is no enlarged axillary or supraclavicular adenopathy.  Limited imaging through the upper abdomen shows bilateral renal cysts.  No acute findings identified.  No mass or adenopathy.  Review of the visualized bony structures is significant for mild scoliosis and multilevel spondylosis.  IMPRESSION:  1.  No acute cardiopulmonary abnormalities. 2.  Postoperative changes from right upper lobectomy. 3.  New sub solid density within the right lower  lobe is nonspecific and may be treatment related.  Recommend follow-up imaging in 3 months to confirm persistence.   Original Report Authenticated By: TaKerby MoorsM.D.    Recent Labs: Lab Results  Component Value Date   WBC 6.8 01/01/2014   HGB 15.7 01/01/2014   HCT 47.4 01/01/2014   PLT 197 01/01/2014   GLUCOSE 93 01/01/2014   ALT 21 01/01/2014   AST 19 01/01/2014   NA 141 01/01/2014   K 5.1 01/01/2014   CL 100 09/07/2013   CREATININE 1.0 01/01/2014   BUN 13.9 01/01/2014  CO2 27 01/01/2014   INR 0.99 02/20/2013   PATH: stage IIIA,  EGFR: Exon 19 deletion  Mutation detected Diagnosis 1. Lung, resection (segmental or lobe), Right upper lobe - ADENOCARCINOMA, 3.4 CM. - MARGINS NOT INVOLVED. - VISCERAL PLEURA FREE OF TUMOR. - ONE BENIGN LYMPH NODE. 2. Lymph node, biopsy, Right upper 4 R - METASTATIC ADENOCARCINOMA. 3. Lymph node, biopsy, Right 2 R - ANTHRACOTIC LYMPH NODE. NO TUMOR IDENTIFIED. Microscopic Comment 1. LUNG Specimen, including laterality: Right lung lobe. Procedure: Lobectomy. Specimen integrity (intact/disrupted): Intact. Tumor site: Right upper lobe. Tumor focality: Unifocal Maximum tumor size (cm): 3.4 cm. Histologic type: Adenocarcinoma, bronchioalveolar cell type. Grade: I. Margins: Free of tumor. Visceral pleura invasion: No. Tumor extension: Within lobe. Treatment effect (if treated with neoadjuvant therapy): No. Lymph -Vascular invasion: Present. Lymph nodes: Number examined - 3 ; Number N1 nodes positive - 0 ; Number N2 nodes positive - 1 TNM code: pT2a , pN2, pMX Ancillary studies: EGFR and ALK pending. Non-neoplastic lung: Unremarkable     Assessment / Plan:    Stable following resection of right upper lobe, lung cancer which was found to be EGFR positive adenocarcinoma stage IIIa. With postoperative radiation changes present on recent CT scan and no evidence of recurrent disease Patient has a CT scan artery scheduled for June 2015, I'll plan to see him soon  after that      [place] B 01/03/2014 1:06 PM

## 2014-01-10 ENCOUNTER — Telehealth: Payer: Self-pay | Admitting: *Deleted

## 2014-01-10 NOTE — Telephone Encounter (Signed)
Pt called requesting records to be sent to Dr. Redmond Baseman office.  He will be seen by him next week.  I faxed records and notified pt.

## 2014-01-15 ENCOUNTER — Encounter: Payer: Self-pay | Admitting: Internal Medicine

## 2014-01-16 ENCOUNTER — Other Ambulatory Visit: Payer: Self-pay | Admitting: Internal Medicine

## 2014-01-16 ENCOUNTER — Ambulatory Visit (INDEPENDENT_AMBULATORY_CARE_PROVIDER_SITE_OTHER): Payer: Medicare Other | Admitting: Psychiatry

## 2014-01-16 DIAGNOSIS — C3410 Malignant neoplasm of upper lobe, unspecified bronchus or lung: Secondary | ICD-10-CM

## 2014-01-16 DIAGNOSIS — Z638 Other specified problems related to primary support group: Secondary | ICD-10-CM

## 2014-01-16 DIAGNOSIS — F0630 Mood disorder due to known physiological condition, unspecified: Secondary | ICD-10-CM

## 2014-01-16 MED ORDER — METHYLPREDNISOLONE (PAK) 4 MG PO TABS: ORAL_TABLET | ORAL | Status: DC

## 2014-01-18 ENCOUNTER — Encounter: Payer: Self-pay | Admitting: Cardiothoracic Surgery

## 2014-01-24 ENCOUNTER — Telehealth: Payer: Self-pay | Admitting: *Deleted

## 2014-01-24 NOTE — Telephone Encounter (Signed)
Returned called.  Pt left vm message regarding appt with Dr. Lamonte Sakai and records to be faxed.  I called back and left mv message stating Dr. Agustina Caroli practice is on [facility] and not need to fax records.  I left name and phone number as well to call with questions

## 2014-01-25 ENCOUNTER — Encounter: Payer: Self-pay | Admitting: Cardiovascular Disease

## 2014-01-25 ENCOUNTER — Ambulatory Visit (INDEPENDENT_AMBULATORY_CARE_PROVIDER_SITE_OTHER): Payer: Medicare Other | Admitting: Cardiovascular Disease

## 2014-01-25 VITALS — BP 134/84 | HR 103 | Ht 71.0 in | Wt 176.3 lb

## 2014-01-25 DIAGNOSIS — I739 Peripheral vascular disease, unspecified: Secondary | ICD-10-CM

## 2014-01-25 DIAGNOSIS — R0789 Other chest pain: Secondary | ICD-10-CM

## 2014-01-25 DIAGNOSIS — E785 Hyperlipidemia, unspecified: Secondary | ICD-10-CM | POA: Insufficient documentation

## 2014-01-25 DIAGNOSIS — R079 Chest pain, unspecified: Secondary | ICD-10-CM

## 2014-01-25 NOTE — Progress Notes (Signed)
01/25/2014 Alvin Hardy   [birth year]  [id number]  Primary Physician Jerlyn Ly, MD Primary Cardiologist: Lorretta Harp MD Renae Gloss   HPI:  Mr. Belshe is a delightful 79 year old fit-appearing married Caucasian male father of 2 children, grandfather of 2 grandchildren referred by Dr. Crist Infante for cardiovascular evaluation. 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 His cardiovascular risk factor profile is markable for hyperlipidemia intolerant to statin drugs and family history of heart disease with a father who had a myocardial infarction at age 52. He is nonhypertensive, diabetic nor has she smoked in the in the past. He has never had a heart attack or stroke. He gets occasional chest pain that occurs both with exertion as well as at rest and after eating. He has had lung cancer and has had a VATS procedure by Dr. Servando Snare of the right upper lobe along with chemotherapy and radiation therapy. He has seen a gastroenterologist and has had EGD and is going to be referred to Dr. Malvin Johns for pulmonary evaluation.   Current Outpatient Prescriptions  Medication Sig Dispense Refill  . acetaminophen (TYLENOL) 325 MG tablet Take 650 mg by mouth every 6 (six) hours as needed (For fever or headache).       Marland Kitchen aspirin EC 81 MG tablet Take 81 mg by mouth every morning.       . calcipotriene (DOVONOX) 0.005 % cream Apply 1 application topically daily as needed (for hands).       . Calcium Carbonate-Vitamin D (CALTRATE 600+D) 600-400 MG-UNIT per tablet Take 1 tablet by mouth every morning.      . Cholecalciferol (VITAMIN D-3) 1000 UNITS CAPS Take 1,000 Units by mouth every morning.       . clobetasol cream (TEMOVATE) 5.95 % Apply 1 application topically daily.       . clonazePAM (KLONOPIN) 0.5 MG tablet Take 0.5 mg by mouth at bedtime.      . cyanocobalamin 1000 MCG tablet Take 1,000 mcg by mouth every morning.       Marland Kitchen esomeprazole (NEXIUM) 40 MG capsule Take 40 mg by mouth daily  before breakfast.      . finasteride (PROSCAR) 5 MG tablet Take 5 mg by mouth daily before breakfast.       . gabapentin (NEURONTIN) 300 MG capsule Take 600-900 mg by mouth 2 (two) times daily. He takes two capsules every morning and three capsules at bedtime.      Marland Kitchen lithium carbonate (LITHOBID) 300 MG CR tablet Take 300 mg by mouth at bedtime.      . Multiple Vitamin (MULTIVITAMIN WITH MINERALS) TABS tablet Take 1 tablet by mouth every morning.      Marland Kitchen OVER THE COUNTER MEDICATION Take 1 tablet by mouth 2 (two) times daily. Glucosamine Chondroitin 1500mg /1200mg       . propranolol (INDERAL) 10 MG tablet Take 10-20 mg by mouth daily as needed (For extremely stressful circumstances.).       Marland Kitchen testosterone cypionate (DEPOTESTOTERONE CYPIONATE) 100 MG/ML injection Inject 100 mg into the muscle every Friday.       . vardenafil (LEVITRA) 5 MG tablet Take 5 mg by mouth daily as needed for erectile dysfunction.        No current facility-administered medications for this visit.    Allergies  Allergen Reactions  . Codeine Anaphylaxis  . Decadron [Dexamethasone]     Pt says it is contraindication due to the lithium he is taking  . Other  Pt. Remarks that all pain med. Make him nauseated   . Zoloft [Sertraline Hcl] Other (See Comments)    REACTION:  unknown    History   Social History  . Marital Status: Married    Spouse Name: N/A    Number of Children: N/A  . Years of Education: N/A   Occupational History  . Not on file.   Social History Main Topics  . Smoking status: Former Smoker    Types: Cigarettes    Quit date: 11/01/1978  . Smokeless tobacco: Not on file  . Alcohol Use: Yes     Comment: occas.  . Drug Use: No  . Sexual Activity: Not on file   Other Topics Concern  . Not on file   Social History Narrative  . No narrative on file     Review of Systems: General: negative for chills, fever, night sweats or weight changes.  Cardiovascular: negative for chest pain,  dyspnea on exertion, edema, orthopnea, palpitations, paroxysmal nocturnal dyspnea or shortness of breath Dermatological: negative for rash Respiratory: negative for cough or wheezing Urologic: negative for hematuria Abdominal: negative for nausea, vomiting, diarrhea, bright red blood per rectum, melena, or hematemesis Neurologic: negative for visual changes, syncope, or dizziness All other systems reviewed and are otherwise negative except as noted above.    Blood pressure 134/84, pulse 103, height 5\' 11"  (1.803 m), weight 176 lb 4.8 oz (79.969 kg).  General appearance: alert and no distress Neck: no adenopathy, no carotid bruit, no JVD, supple, symmetrical, trachea midline and thyroid not enlarged, symmetric, no tenderness/mass/nodules Lungs: clear to auscultation bilaterally Heart: regular rate and rhythm, S1, S2 normal, no murmur, click, rub or gallop Abdomen: soft, non-tender; bowel sounds normal; no masses,  no organomegaly Extremities: extremities normal, atraumatic, no cyanosis or edema and 2+ pedal pulses bilaterally  EKG sinus tachycardia 103 without ST or T wave changes  ASSESSMENT AND PLAN:   Hyperlipidemia The patient's recent lipid profile revealed a total cholesterol 248, LDL of 156 and HDL of 44. He has tried by statin drugs in the past with along with CoQ10, and they were all stopped because of intolerance. I'm going to refer him to [employer] at the [facility] heart care lipid clinic.  Chest pain Patient has history of chest pain that occurs with exertion and after eating. He has had chest radiation as well as hyperlipidemia. He wishes to embark on an exercise routine. I'm going to get an exercise Myoview stress test to risk stratify him.  Claudication The patient does relate bilateral calf discomfort with ambulation. He has 2+ pedal pulses. I am going to get lower extremity arterial Doppler studies.      Lorretta Harp MD FACP,FACC,FAHA, [facility] 01/25/2014 9:56  AM

## 2014-01-25 NOTE — Assessment & Plan Note (Signed)
The patient's recent lipid profile revealed a total cholesterol 248, LDL of 156 and HDL of 44. He has tried by statin drugs in the past with along with CoQ10, and they were all stopped because of intolerance. I'm going to refer him to [employer] at the [facility] heart care lipid clinic.

## 2014-01-25 NOTE — Assessment & Plan Note (Signed)
The patient does relate bilateral calf discomfort with ambulation. He has 2+ pedal pulses. I am going to get lower extremity arterial Doppler studies.

## 2014-01-25 NOTE — Assessment & Plan Note (Signed)
Patient has history of chest pain that occurs with exertion and after eating. He has had chest radiation as well as hyperlipidemia. He wishes to embark on an exercise routine. I'm going to get an exercise Myoview stress test to risk stratify him.

## 2014-01-25 NOTE — Patient Instructions (Signed)
  We will see you back in follow up after the tests  Dr Gwenlyn Found has ordered : 1. Exercise Myoview- this is a test that looks at the blood flow to your heart muscle.  It takes approximately 2 1/2 hours. Please follow instruction sheet, as given.  2.Lower extremity arterial doppler- During this test, ultrasound is used to evaluate arterial blood flow in the legs. Allow approximately one hour for this exam.   Dr Gwenlyn Found has referred you to the Lipid clinic at [place] with [facility]

## 2014-01-27 ENCOUNTER — Encounter: Payer: Self-pay | Admitting: Internal Medicine

## 2014-01-28 ENCOUNTER — Ambulatory Visit: Payer: Federal, State, Local not specified - PPO | Admitting: Cardiovascular Disease

## 2014-02-04 ENCOUNTER — Encounter: Payer: Self-pay | Admitting: Emergency Medicine

## 2014-02-04 ENCOUNTER — Ambulatory Visit (INDEPENDENT_AMBULATORY_CARE_PROVIDER_SITE_OTHER): Payer: Medicare Other | Admitting: Emergency Medicine

## 2014-02-04 VITALS — BP 122/76 | HR 104 | Ht 71.0 in | Wt 182.4 lb

## 2014-02-04 DIAGNOSIS — R0600 Dyspnea, unspecified: Secondary | ICD-10-CM

## 2014-02-04 DIAGNOSIS — R0989 Other specified symptoms and signs involving the circulatory and respiratory systems: Secondary | ICD-10-CM

## 2014-02-04 DIAGNOSIS — R0609 Other forms of dyspnea: Secondary | ICD-10-CM

## 2014-02-04 NOTE — Assessment & Plan Note (Signed)
The description and resolution sound like UA irritation and wheeze, probably due to allergic rhinitis. His Ct scan from 3/15 does not show recurrent pneumonitis. He never smoked cigarettes and his pre-SGY PFT did not show AFL.  - will try treating with a nasal steroid and see how he does. If he has recurrent dyspnea, new sx then will repeat his PFT.  - follow up prn.

## 2014-02-04 NOTE — Patient Instructions (Signed)
Please start nasonex 2 sprays each nostril daily If you develop recurrent breathing difficulty then we will probably repeat your pulmonary function testing.  Follow with Dr Lamonte Sakai if you have any problems.

## 2014-02-04 NOTE — Progress Notes (Signed)
Subjective:    Patient ID: Alvin Hardy, male    DOB: Aug 06, 1935, 79 y.o.   MRN: [id number]  HPI 79 yo man, hx tobacco, GERD, RUL adeno CA s/p lobectomy and XRT + chemo. The path showed EGFR mutation (alk negative).  His last CT scan was 01/01/14 as below. He was treated for a presumed radiation pneumonitis (see CT scan 11/14). Now 3 weeks ago he began to have some UA noise, anorexia, nasal and chest mucous.   PFT 02/21/13 > normal AF, evidence mild restriction on volumes, normal DLCO.    Review of Systems  Constitutional: Positive for appetite change and unexpected weight change. Negative for fever.  HENT: Positive for sore throat and trouble swallowing. Negative for congestion, dental problem, ear pain, nosebleeds, postnasal drip, rhinorrhea, sinus pressure and sneezing.   Eyes: Negative for redness and itching.  Respiratory: Positive for cough and shortness of breath. Negative for chest tightness and wheezing.   Cardiovascular: Negative for palpitations and leg swelling.  Gastrointestinal: Positive for abdominal distention. Negative for nausea and vomiting.  Genitourinary: Negative for dysuria.  Musculoskeletal: Negative for joint swelling.  Skin: Negative for rash.  Neurological: Negative for headaches.  Hematological: Does not bruise/bleed easily.  Psychiatric/Behavioral: Negative for dysphoric mood. The patient is not nervous/anxious.    Past Medical History  Diagnosis Date  . BPH (benign prostatic hyperplasia)   . Hypogonadism male   . Bipolar 1 disorder   . ED (erectile dysfunction)   . Hyperlipidemia   . Neuromuscular disorder     peripheral neuropathy  . Cataract 2010    Bilateral  . Laryngopharyngeal reflux   . Gilbert's syndrome   . Deviated septum     TO THE LEFT  . Morton's neuroma     LEFT FOOT  . Lung mass 02/07/13    RIGHT UPPER LOBE  . Shortness of breath     voice breaks, ? related to reflux, although told by Dr. Ardis Hughs, S.- early 27 yr. old  . GERD  (gastroesophageal reflux disease)   . Arthritis     psoriatic arthritis, ? , treated /w mmethotrexate   . Finger injury     mallet finger-- 02/13/2013, cast in place, followed by dr. Fredna Dow  . Allergy   . Depression   . History of radiation therapy 07/24/13-08/27/13    50Gy/25 fx chest  . lung ca dx'd 12/2012    rul  . Chest pain      Family History  Problem Relation Age of Onset  . Diabetes Mother   . Cancer Mother     abdomen  . Heart disease Father   . Diabetes Son   . Cancer Maternal Aunt     lung ca, smoker     History   Social History  . Marital Status: Married    Spouse Name: N/A    Number of Children: N/A  . Years of Education: N/A   Occupational History  . Not on file.   Social History Main Topics  . Smoking status: Former Smoker    Types: Cigarettes    Quit date: 11/01/1978  . Smokeless tobacco: Not on file  . Alcohol Use: Yes     Comment: occas.  . Drug Use: No  . Sexual Activity: Not on file   Other Topics Concern  . Not on file   Social History Narrative  . No narrative on file     Allergies  Allergen Reactions  . Codeine Anaphylaxis  . Decadron [Dexamethasone]  Pt says it is contraindication due to the lithium he is taking  . Other     Pt. Remarks that all pain med. Make him nauseated   . Zoloft [Sertraline Hcl] Other (See Comments)    REACTION:  unknown     Outpatient Prescriptions Prior to Visit  Medication Sig Dispense Refill  . aspirin EC 81 MG tablet Take 81 mg by mouth every morning.       . calcipotriene (DOVONOX) 0.005 % cream Apply 1 application topically daily as needed (for hands).       . Calcium Carbonate-Vitamin D (CALTRATE 600+D) 600-400 MG-UNIT per tablet Take 1 tablet by mouth every morning.      . Cholecalciferol (VITAMIN D-3) 1000 UNITS CAPS Take 1,000 Units by mouth every morning.       . clobetasol cream (TEMOVATE) 1.61 % Apply 1 application topically daily.       . clonazePAM (KLONOPIN) 0.5 MG tablet Take 0.5 mg  by mouth at bedtime.      . cyanocobalamin 1000 MCG tablet Take 1,000 mcg by mouth every morning.       Marland Kitchen esomeprazole (NEXIUM) 40 MG capsule Take 40 mg by mouth daily before breakfast.      . finasteride (PROSCAR) 5 MG tablet Take 5 mg by mouth daily before breakfast.       . gabapentin (NEURONTIN) 300 MG capsule Take 600-900 mg by mouth 2 (two) times daily. He takes two capsules every morning and three capsules at bedtime.      Marland Kitchen lithium carbonate (LITHOBID) 300 MG CR tablet Take 300 mg by mouth at bedtime.      . Multiple Vitamin (MULTIVITAMIN WITH MINERALS) TABS tablet Take 1 tablet by mouth every morning.      Marland Kitchen OVER THE COUNTER MEDICATION Take 1 tablet by mouth 2 (two) times daily. Glucosamine Chondroitin 1526m/1200mg      . testosterone cypionate (DEPOTESTOTERONE CYPIONATE) 100 MG/ML injection Inject 100 mg into the muscle every Friday.       . vardenafil (LEVITRA) 5 MG tablet Take 5 mg by mouth daily as needed for erectile dysfunction.       . propranolol (INDERAL) 10 MG tablet Take 10-20 mg by mouth daily as needed (For extremely stressful circumstances.).       .Marland Kitchenacetaminophen (TYLENOL) 325 MG tablet Take 650 mg by mouth every 6 (six) hours as needed (For fever or headache).        No facility-administered medications prior to visit.    01/01/14 --  COMPARISON: 09/07/2013  FINDINGS:  The heart size appears normal. No pericardial effusion. There is no  mediastinal or hilar adenopathy. No axillary or supraclavicular  adenopathy noted.  Small right pleural effusion is identified and is new from previous  exam. Postoperative change and volume loss is identified involving  the right hemi thorax. There are progressive radiation changes noted  within the right lung. No specific features are identified to  suggest local tumor recurrence.  No axillary or supraclavicular adenopathy. Related through the upper  abdomen is on unremarkable. There are no acute findings identified.  The adrenal  glands both appear normal. Stable cyst within the upper  pole of the left kidney measuring 2.4 cm, image 64/series 2.  Review of the visualized osseous structures is significant for mild  scoliosis and multilevel spondylosis involving the thoracic spine.  IMPRESSION:  1. Progressive changes of external beam radiation are identified  within the right lung.  2. No evidence for  residual or recurrence of tumor      Objective:   Physical Exam Filed Vitals:   02/04/14 1551  BP: 122/76  Pulse: 104  Height: 5' 11" (1.803 m)  Weight: 82.736 kg (182 lb 6.4 oz)  SpO2: 94%   Gen: Pleasant, well-nourished, in no distress,  normal affect  ENT: No lesions,  mouth clear,  oropharynx clear, no postnasal drip  Neck: No JVD, no TMG, no carotid bruits  Lungs: No use of accessory muscles, clear without rales or rhonchi  Cardiovascular: RRR, heart sounds normal, no murmur or gallops, no peripheral edema  Musculoskeletal: No deformities, no cyanosis or clubbing  Neuro: alert, non focal  Skin: Warm, no lesions or rashes     Assessment & Plan:  Dyspnea The description and resolution sound like UA irritation and wheeze, probably due to allergic rhinitis. His Ct scan from 3/15 does not show recurrent pneumonitis. He never smoked cigarettes and his pre-SGY PFT did not show AFL.  - will try treating with a nasal steroid and see how he does. If he has recurrent dyspnea, new sx then will repeat his PFT.  - follow up prn.

## 2014-02-06 ENCOUNTER — Telehealth (HOSPITAL_COMMUNITY): Payer: Self-pay

## 2014-02-07 ENCOUNTER — Ambulatory Visit (INDEPENDENT_AMBULATORY_CARE_PROVIDER_SITE_OTHER): Payer: Medicare Other | Admitting: Psychiatry

## 2014-02-07 DIAGNOSIS — F0630 Mood disorder due to known physiological condition, unspecified: Secondary | ICD-10-CM

## 2014-02-07 DIAGNOSIS — Z638 Other specified problems related to primary support group: Secondary | ICD-10-CM

## 2014-02-08 ENCOUNTER — Ambulatory Visit (HOSPITAL_COMMUNITY)
Admission: RE | Admit: 2014-02-08 | Discharge: 2014-02-08 | Disposition: A | Discharge status: Home / Self Care | Payer: Federal, State, Local not specified - PPO | Source: Ambulatory Visit | Attending: Internal Medicine | Admitting: Internal Medicine

## 2014-02-08 DIAGNOSIS — Z8249 Family history of ischemic heart disease and other diseases of the circulatory system: Secondary | ICD-10-CM | POA: Insufficient documentation

## 2014-02-08 DIAGNOSIS — R0989 Other specified symptoms and signs involving the circulatory and respiratory systems: Secondary | ICD-10-CM | POA: Insufficient documentation

## 2014-02-08 DIAGNOSIS — R0789 Other chest pain: Secondary | ICD-10-CM

## 2014-02-08 DIAGNOSIS — R0609 Other forms of dyspnea: Secondary | ICD-10-CM | POA: Insufficient documentation

## 2014-02-08 DIAGNOSIS — R0602 Shortness of breath: Secondary | ICD-10-CM | POA: Insufficient documentation

## 2014-02-08 DIAGNOSIS — R079 Chest pain, unspecified: Secondary | ICD-10-CM | POA: Insufficient documentation

## 2014-02-08 MED ORDER — TECHNETIUM TC 99M SESTAMIBI GENERIC - CARDIOLITE
10.2000 | Freq: Once | INTRAVENOUS | Status: AC | PRN
  Administered 2014-02-08: 10 via INTRAVENOUS

## 2014-02-08 MED ORDER — TECHNETIUM TC 99M SESTAMIBI GENERIC - CARDIOLITE
29.4000 | Freq: Once | INTRAVENOUS | Status: AC | PRN
  Administered 2014-02-08: 29 via INTRAVENOUS

## 2014-02-08 NOTE — Procedures (Addendum)
[facility] [address] [place] [address] [phone]  Cardiology Nuclear Med Study  Alvin Hardy is a 79 y.o. male     MRN : [id number]     DOB: 1935-08-31  Procedure Date: 02/08/2014  Nuclear Med Background Indication for Stress Test:  Evaluation for Ischemia History:  No prior cardiac disease reported; Pt does have hx of lung CA;No prior NUC study for comparison Cardiac Risk Factors: Claudication, Family History - CAD, History of Smoking, Lipids and Overweight  Symptoms:  Chest Pain, DOE and SOB   Nuclear Pre-Procedure Caffeine/Decaff Intake:  12:00am NPO After: 10am   IV Site: R Forearm  IV 0.9% NS with Angio Cath:  22g  Chest Size (in):  42"  IV Started by: Rolene Course, RN  Height: 5\' 11"  (1.803 m)  Cup Size: n/a  BMI:  Body mass index is 24.56 kg/(m^2). Weight:  176 lb (79.833 kg)   Tech Comments:  n/a    Nuclear Med Study 1 or 2 day study: 1 day  Stress Test Type:  Stress  Order Authorizing Provider:  Quay Burow, MD   Resting Radionuclide: Technetium 64m Sestamibi  Resting Radionuclide Dose: 10.2 mCi   Stress Radionuclide:  Technetium 69m Sestamibi  Stress Radionuclide Dose: 29.4 mCi           Stress Protocol Rest HR: 98 Stress HR: 130  Rest BP: 128/79 Stress BP:180/78  Exercise Time (min): 6:40 METS: 8.00   Predicted Max HR: 144 bpm % Max HR: 90.28 bpm Rate Pressure Product: 23400  Dose of Adenosine (mg):  n/a Dose of Lexiscan: n/a mg  Dose of Atropine (mg): n/a Dose of Dobutamine: n/a mcg/kg/min (at max HR)  Stress Test Technologist: Mellody Memos, CCT Nuclear Technologist: Imagene Riches, CNMT   Rest Procedure:  Myocardial perfusion imaging was performed at rest 45 minutes following the intravenous administration of Technetium 62m Sestamibi. Stress Procedure:  The patient performed treadmill exercise using a Bruce  Protocol for 6 minutes and 41 seconds. The patient stopped due to  shortness of breath and fatigue. Patient denied any chest pain.  There were no significant ST-T wave changes.  Technetium 70m Sestamibi was injected IV at peak exercise and myocardial perfusion imaging was performed after a brief delay.  Transient Ischemic Dilatation (Normal <1.22):  0.93 Lung/Heart Ratio (Normal <0.45):  0.26 QGS EDV:  72 ml QGS ESV:  27 ml LV Ejection Fraction: 63%  Rest ECG: NSR - Normal EKG  Stress ECG: No significant ST segment change suggestive of ischemia.  QPS Raw Data Images:  Normal; no motion artifact; normal heart/lung ratio. Stress Images:  There is decreased uptake in the inferior wall. Rest Images:  There is decreased uptake in the inferior wall. Subtraction (SDS):  There is a fixed inferior defect that is most consistent with diaphragmatic attenuation.  Impression Exercise Capacity:  Fair exercise capacity. BP Response:  Hypertensive blood pressure response. Clinical Symptoms:  There is dyspnea. ECG Impression:  No significant ST segment change suggestive of ischemia. Comparison with Prior Nuclear Study: No previous nuclear study performed  Overall Impression:  Low risk stress nuclear study with inferior bowel attenuation artifact. No ischemia.  LV Wall Motion:  NL LV Function; NL Wall Motion; EF 63%  Pixie Casino, MD, [facility] Board Certified in Nuclear Cardiology Attending Cardiologist [place]  Pixie Casino, MD  02/08/2014 5:18 PM

## 2014-02-12 ENCOUNTER — Telehealth: Payer: Self-pay | Admitting: *Deleted

## 2014-02-12 NOTE — Telephone Encounter (Signed)
Pt was calling in regards to a phone call from you. He stated that the information was not on MyChart and he wanted to discuss the results.  JB

## 2014-02-13 ENCOUNTER — Ambulatory Visit (HOSPITAL_COMMUNITY)
Admission: RE | Admit: 2014-02-13 | Discharge: 2014-02-13 | Disposition: A | Discharge status: Home / Self Care | Payer: Federal, State, Local not specified - PPO | Source: Ambulatory Visit | Attending: Cardiovascular Disease | Admitting: Cardiovascular Disease

## 2014-02-13 DIAGNOSIS — I739 Peripheral vascular disease, unspecified: Secondary | ICD-10-CM

## 2014-02-13 DIAGNOSIS — I70219 Atherosclerosis of native arteries of extremities with intermittent claudication, unspecified extremity: Secondary | ICD-10-CM | POA: Insufficient documentation

## 2014-02-13 NOTE — Progress Notes (Signed)
Arterial Duplex Lower Ext. Completed. Francene Mcerlean, BS, RDMS, RVT  

## 2014-02-13 NOTE — Telephone Encounter (Signed)
Patient states he is unable to see the entire report on the nuc study.  On his way here for another test and would like a copy and discuss results.  Told him to have him page me on arrival and will discuss the results and give him a copy.

## 2014-02-19 ENCOUNTER — Ambulatory Visit (INDEPENDENT_AMBULATORY_CARE_PROVIDER_SITE_OTHER): Payer: Medicare Other | Admitting: Pharmacist

## 2014-02-19 VITALS — Wt 182.0 lb

## 2014-02-19 DIAGNOSIS — E785 Hyperlipidemia, unspecified: Secondary | ICD-10-CM

## 2014-02-19 DIAGNOSIS — Z79899 Other long term (current) drug therapy: Secondary | ICD-10-CM

## 2014-02-19 MED ORDER — ROSUVASTATIN CALCIUM 10 MG PO TABS: 10.0000 mg | ORAL_TABLET | ORAL | Status: DC

## 2014-02-19 NOTE — Progress Notes (Signed)
Patient is a pleasant 79 y.o. WM referred to lipid clinic by Dr. Gwenlyn Found due to failing multiple statins, having elevated LDL, and having complaints of chest pains.  Patient had a nuclear stress test and and ABI performed recently, and both were normal.  His last LDL was 156 mg/dL.  Patient's father had an MI at age 36 y.o..  He was diagnosed with lung cancer last year, which was successfully treated with chemo + radiation.  He developed leg aches with Zocor, Lipitor, Crestor, and Zetia in the past he tells me.  He states he took Silo, but not sure if he ever failed it, but didn't like having to take the powder or 6 pills daily.  He is taking Vitamin D 1,000 units daily.  He lives a fairly active lifestyle.  RF:  Family h/o CAD, age - LDL goal < 130 at least, non-HDL goal < 160 at least Meds:  Not on lipid lowering therapy Intolerant:  Zocor, Lipitor, Crestor, Zetia (all caused muscle aches in legs after being on medication for a few weeks).  Took Welchol but stopped it on own.  Social history:  Retired disability judge.  Former smoker (quit 30 years ago).  Drinks alcohol occasionally. Family history:  Father had an MI at 78 y.o. ; mother and sister both with diabetes Diet:  Eats a relatively low fat diet.  Last known cholesterol labs: TC 248, LDL 156, HDL 44   Current Outpatient Prescriptions  Medication Sig Dispense Refill  . albuterol (PROAIR HFA) 108 (90 BASE) MCG/ACT inhaler Inhale 2 puffs into the lungs every 6 (six) hours as needed for wheezing or shortness of breath.      Marland Kitchen aspirin EC 81 MG tablet Take 81 mg by mouth every morning.       . calcipotriene (DOVONOX) 0.005 % cream Apply 1 application topically daily as needed (for hands).       . Calcium Carbonate-Vitamin D (CALTRATE 600+D) 600-400 MG-UNIT per tablet Take 1 tablet by mouth every morning.      . Cholecalciferol (VITAMIN D-3) 1000 UNITS CAPS Take 1,000 Units by mouth every morning.       . clobetasol cream (TEMOVATE) 5.00 %  Apply 1 application topically daily.       . clonazePAM (KLONOPIN) 0.5 MG tablet Take 0.5 mg by mouth at bedtime.      . cyanocobalamin 1000 MCG tablet Take 1,000 mcg by mouth every morning.       Marland Kitchen esomeprazole (NEXIUM) 40 MG capsule Take 40 mg by mouth daily before breakfast.      . finasteride (PROSCAR) 5 MG tablet Take 5 mg by mouth daily before breakfast.       . gabapentin (NEURONTIN) 300 MG capsule Take 600-900 mg by mouth 2 (two) times daily. He takes two capsules every morning and three capsules at bedtime.      Marland Kitchen lithium carbonate (LITHOBID) 300 MG CR tablet Take 300 mg by mouth at bedtime.      . Multiple Vitamin (MULTIVITAMIN WITH MINERALS) TABS tablet Take 1 tablet by mouth every morning.      Marland Kitchen OVER THE COUNTER MEDICATION Take 1 tablet by mouth 2 (two) times daily. Glucosamine Chondroitin 1500mg /1200mg       . propranolol (INDERAL) 10 MG tablet Take 10-20 mg by mouth daily as needed (For extremely stressful circumstances.).       Marland Kitchen testosterone cypionate (DEPOTESTOTERONE CYPIONATE) 100 MG/ML injection Inject 100 mg into the muscle every Friday.       Marland Kitchen  vardenafil (LEVITRA) 5 MG tablet Take 5 mg by mouth daily as needed for erectile dysfunction.        No current facility-administered medications for this visit.   Allergies  Allergen Reactions  . Codeine Anaphylaxis  . Decadron [Dexamethasone]     Pt says it is contraindication due to the lithium he is taking  . Other     Pt. Remarks that all pain med. Make him nauseated   . Statins     Lipitor, Crestor, Zocor all caused muscle aches  . Zetia [Ezetimibe]     Muscle aches  . Zoloft [Sertraline Hcl] Other (See Comments)    REACTION:  unknown   Family History  Problem Relation Age of Onset  . Diabetes Mother   . Cancer Mother     abdomen  . Heart disease Father   . Diabetes Son   . Cancer Maternal Aunt     lung ca, smoker

## 2014-02-19 NOTE — Patient Instructions (Signed)
1.  Start Crestor 10 mg once weekly. 2.  Call [facility] Evert if muscle aches occur.  [phone].  Can try to get you on PCSK-9 inhibitor if needed (will likely be on market in next 3-4 months) 3.  Recheck cholesterol in 4 months (06/18/14) and see [facility] Evert 2 days later to review (06/20/14 at 11:00 am)

## 2014-02-19 NOTE — Assessment & Plan Note (Addendum)
Patient and I discussed his therapeutic options, including PCSK-9 inhibitors.  He doesn't qualify for SPIRE-2 at this time, however he may be able to get on therapy when they hit the market later this year.  He is to start Crestor 10 mg once weekly at this time, and recheck lipid / liver in 4 months.  If cholesterol not well controlled, or if he can't tolerate once weekly Crestor, may consider PCSK-9 inhibitor in 4 months, if evolocumab or alirocumab are available at that time, and if indications are appropriate.  Patient agreeable to plan. Plan: 1.  Start Crestor 10 mg once weekly. 2.  Call Ysidro Evert if muscle aches occur.  [phone].  Can try to get you on PCSK-9 inhibitor if needed (will likely be on market in next 3-4 months) 3.  Recheck cholesterol in 4 months (06/18/14) and see Ysidro Evert 2 days later to review (06/20/14 at 11:00 am)

## 2014-02-21 ENCOUNTER — Ambulatory Visit (INDEPENDENT_AMBULATORY_CARE_PROVIDER_SITE_OTHER): Payer: Medicare Other | Admitting: Cardiovascular Disease

## 2014-02-21 ENCOUNTER — Encounter: Payer: Self-pay | Admitting: Cardiovascular Disease

## 2014-02-21 VITALS — BP 136/88 | HR 88 | Ht 71.0 in | Wt 181.8 lb

## 2014-02-21 DIAGNOSIS — R079 Chest pain, unspecified: Secondary | ICD-10-CM

## 2014-02-21 NOTE — Progress Notes (Signed)
Alvin Hardy returns today for followup of his diagnostic tests. A Myoview stress test is low risk with diaphragmatic attenuation. There were being addressed by Alvin Hardy at our lipid clinic. Lower extremity R. She'll Dopplers were normal. I do not think his lower extremities comfort is vascular in nature. I will see him back in one year  Alvin Hardy, M.D., [place], [facility], [place], [place] [address]. [place], [place]  [address]  [phone] 02/21/2014 11:02 AM

## 2014-02-21 NOTE — Patient Instructions (Signed)
Your physician recommends that you schedule a follow-up appointment in: 1 year  

## 2014-04-03 ENCOUNTER — Ambulatory Visit (HOSPITAL_COMMUNITY)
Admission: RE | Admit: 2014-04-03 | Discharge: 2014-04-03 | Disposition: A | Discharge status: Home / Self Care | Payer: Federal, State, Local not specified - PPO | Source: Ambulatory Visit | Attending: Internal Medicine | Admitting: Internal Medicine

## 2014-04-03 ENCOUNTER — Other Ambulatory Visit (HOSPITAL_BASED_OUTPATIENT_CLINIC_OR_DEPARTMENT_OTHER): Payer: Federal, State, Local not specified - PPO

## 2014-04-03 ENCOUNTER — Encounter (HOSPITAL_COMMUNITY): Payer: Self-pay

## 2014-04-03 ENCOUNTER — Other Ambulatory Visit: Payer: Federal, State, Local not specified - PPO

## 2014-04-03 DIAGNOSIS — C3410 Malignant neoplasm of upper lobe, unspecified bronchus or lung: Secondary | ICD-10-CM

## 2014-04-03 DIAGNOSIS — Z923 Personal history of irradiation: Secondary | ICD-10-CM | POA: Insufficient documentation

## 2014-04-03 LAB — CBC WITH DIFFERENTIAL/PLATELET
BASO%: 1.1 % (ref 0.0–2.0)
Basophils Absolute: 0.1 10*3/uL (ref 0.0–0.1)
EOS%: 2.7 % (ref 0.0–7.0)
Eosinophils Absolute: 0.2 10*3/uL (ref 0.0–0.5)
HCT: 47.1 % (ref 38.4–49.9)
HGB: 15.6 g/dL (ref 13.0–17.1)
LYMPH%: 19.2 % (ref 14.0–49.0)
MCH: 29.6 pg (ref 27.2–33.4)
MCHC: 33 g/dL (ref 32.0–36.0)
MCV: 89.4 fL (ref 79.3–98.0)
MONO#: 0.6 10*3/uL (ref 0.1–0.9)
MONO%: 8.4 % (ref 0.0–14.0)
NEUT#: 5.1 10*3/uL (ref 1.5–6.5)
NEUT%: 68.6 % (ref 39.0–75.0)
Platelets: 206 10*3/uL (ref 140–400)
RBC: 5.27 10*6/uL (ref 4.20–5.82)
RDW: 15.2 % — ABNORMAL HIGH (ref 11.0–14.6)
WBC: 7.4 10*3/uL (ref 4.0–10.3)
lymph#: 1.4 10*3/uL (ref 0.9–3.3)

## 2014-04-03 LAB — COMPREHENSIVE METABOLIC PANEL (CC13)
ALK PHOS: 60 U/L (ref 40–150)
ALT: 18 U/L (ref 0–55)
AST: 16 U/L (ref 5–34)
Albumin: 3.9 g/dL (ref 3.5–5.0)
Anion Gap: 15 mEq/L — ABNORMAL HIGH (ref 3–11)
BUN: 15.2 mg/dL (ref 7.0–26.0)
CO2: 20 meq/L — AB (ref 22–29)
CREATININE: 0.9 mg/dL (ref 0.7–1.3)
Calcium: 9.2 mg/dL (ref 8.4–10.4)
Chloride: 108 mEq/L (ref 98–109)
Glucose: 100 mg/dl (ref 70–140)
Potassium: 4.3 mEq/L (ref 3.5–5.1)
SODIUM: 143 meq/L (ref 136–145)
TOTAL PROTEIN: 6.8 g/dL (ref 6.4–8.3)
Total Bilirubin: 1.14 mg/dL (ref 0.20–1.20)

## 2014-04-03 MED ORDER — IOHEXOL 300 MG/ML  SOLN
80.0000 mL | Freq: Once | INTRAMUSCULAR | Status: AC | PRN
  Administered 2014-04-03: 80 mL via INTRAVENOUS

## 2014-04-10 ENCOUNTER — Telehealth: Payer: Self-pay | Admitting: *Deleted

## 2014-04-10 NOTE — Telephone Encounter (Signed)
Pt left me vm message regarding scan.  He would like to have a copy of scan before he sees Dr. Julien Nordmann.  I stated he needed to fill out paper work to obtain scan.  I left my name and phone number if need to call me.

## 2014-04-11 ENCOUNTER — Encounter: Payer: Self-pay | Admitting: Cardiothoracic Surgery

## 2014-04-11 ENCOUNTER — Ambulatory Visit (INDEPENDENT_AMBULATORY_CARE_PROVIDER_SITE_OTHER): Payer: Federal, State, Local not specified - PPO | Admitting: Cardiothoracic Surgery

## 2014-04-11 ENCOUNTER — Encounter: Payer: Self-pay | Admitting: Internal Medicine

## 2014-04-11 ENCOUNTER — Ambulatory Visit (HOSPITAL_BASED_OUTPATIENT_CLINIC_OR_DEPARTMENT_OTHER): Payer: Federal, State, Local not specified - PPO | Admitting: Internal Medicine

## 2014-04-11 ENCOUNTER — Telehealth: Payer: Self-pay | Admitting: Internal Medicine

## 2014-04-11 VITALS — BP 129/78 | HR 94 | Resp 18 | Ht 70.0 in | Wt 183.0 lb

## 2014-04-11 VITALS — BP 142/73 | HR 84 | Temp 97.9°F | Resp 18 | Ht 71.0 in | Wt 183.4 lb

## 2014-04-11 DIAGNOSIS — C3410 Malignant neoplasm of upper lobe, unspecified bronchus or lung: Secondary | ICD-10-CM

## 2014-04-11 DIAGNOSIS — Z09 Encounter for follow-up examination after completed treatment for conditions other than malignant neoplasm: Secondary | ICD-10-CM

## 2014-04-11 NOTE — Progress Notes (Signed)
CT scan routed through [facility] to Dr Crist Infante per patient request.  Ted Mcalpine

## 2014-04-11 NOTE — Telephone Encounter (Signed)
gv adn printed appt sched and avs for tp fro SEpt.Marland KitchenMarland Kitchen

## 2014-04-11 NOTE — Progress Notes (Signed)
[address]       [place] [address]             [phone]                 Alvin Hardy [facility] Medical Record #[id number] Date of Birth: Jul 23, 1935  Jerlyn Ly, MD Jerlyn Ly, MD  Chief Complaint:   PostOp Follow Up Visit  02/21/2013   PREOPERATIVE DIAGNOSIS: Right upper lobe lung mass suspicious for  malignancy.  POSTOPERATIVE DIAGNOSIS: Right upper lobe lung mass suspicious for  malignancy. 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  PROCEDURE PERFORMED: Bronchoscopy, right upper lobe. Bronchoscopy  right video-assisted thoracoscopy, mini thoracotomy and right upper lobe  resection with lymph node dissection and placement of On-Q device.  SURGEON: Lanelle Bal, MD   Stage III A (T2 A., N2, M0) non-small cell lung cancer, adenocarcinoma with positive EGFR mutation in exon 19 and negative ALK gene translocation diagnosed in April 2014   Treatment :  1) Status post right upper lobectomy with lymph node dissection   02/21/2013.  2) Systemic adjuvant chemotherapy with carboplatin for AUC of 5 and Alimta 500 mg/M2 every 3 weeks. Status post 4 cycles. Last cycle was given on 05/29/2013. (Carboplatin was used in place of cisplatin secondary to concern about intolerability and significant adverse effects of cisplatin). Dr Julien Nordmann 3) Adjuvant radiotherapy to the mediastinum under the care of Dr. Lisbeth Renshaw completed 08/27/2013.      History of Present Illness:     Patient returns now 10 months postoperatively. The patient notes significant improvement in his overall symptoms since November. He is returning to near-normal activities. Does have some mild shortness of breath with exertion. Denies any hemoptysis. Does occasionally have expiratory wheeze and spontaneously clears. Recent CT scan was performed and reviewed with the patient. Overall the patient feels better, and much improved from the fall of 2014. He still  has some fatigue, but denies shortness of breath.   History  Smoking status  . Former Smoker  . Types: Cigarettes  . Quit date: 11/01/1978  Smokeless tobacco  . Not on file       Allergies  Allergen Reactions  . Codeine Anaphylaxis  . Decadron [Dexamethasone]     Pt says it is contraindication due to the lithium he is taking  . Other     Pt. Remarks that all pain med. Make him nauseated   . Statins     Lipitor, Crestor, Zocor all caused muscle aches  . Zetia [Ezetimibe]     Muscle aches  . Zoloft [Sertraline Hcl] Other (See Comments)    REACTION:  unknown    Current Outpatient Prescriptions  Medication Sig Dispense Refill  . aspirin EC 81 MG tablet Take 81 mg by mouth every morning.       . calcipotriene (DOVONOX) 0.005 % cream Apply 1 application topically daily as needed (for hands).       . Calcium Carbonate-Vitamin D (CALTRATE 600+D) 600-400 MG-UNIT per tablet Take 1 tablet by mouth every morning.      . Cholecalciferol (VITAMIN D-3) 1000 UNITS CAPS Take 1,000 Units by mouth every morning.       . clobetasol cream (TEMOVATE) 2.02 % Apply 1 application topically daily.       . clonazePAM (KLONOPIN) 0.5 MG tablet Take 0.5 mg by mouth at bedtime.      . cyanocobalamin 1000 MCG tablet  Take 1,000 mcg by mouth every morning.       Marland Kitchen esomeprazole (NEXIUM) 40 MG capsule Take 40 mg by mouth daily before breakfast.      . finasteride (PROSCAR) 5 MG tablet Take 5 mg by mouth daily before breakfast.       . gabapentin (NEURONTIN) 300 MG capsule Take 600-900 mg by mouth 2 (two) times daily. He takes two capsules every morning and three capsules at bedtime.      Marland Kitchen lithium carbonate (LITHOBID) 300 MG CR tablet Take 300 mg by mouth at bedtime.      . Multiple Vitamin (MULTIVITAMIN WITH MINERALS) TABS tablet Take 1 tablet by mouth every morning.      Marland Kitchen OVER THE COUNTER MEDICATION Take 1 tablet by mouth 2 (two) times daily. Glucosamine Chondroitin 1511m/1200mg      . propranolol  (INDERAL) 10 MG tablet Take 10-20 mg by mouth daily as needed (For extremely stressful circumstances.).       .Marland Kitchenrosuvastatin (CRESTOR) 10 MG tablet Take 1 tablet (10 mg total) by mouth once a week.  90 tablet  3  . testosterone cypionate (DEPOTESTOTERONE CYPIONATE) 100 MG/ML injection Inject 100 mg into the muscle every Friday.       . vardenafil (LEVITRA) 5 MG tablet Take 5 mg by mouth daily as needed for erectile dysfunction.        No current facility-administered medications for this visit.       Physical Exam: BP 129/78  Pulse 94  Resp 18  Ht '5\' 10"'  (1.778 m)  Wt 183 lb (83.008 kg)  BMI 26.26 kg/m2  SpO2 98%  Wt Readings from Last 3 Encounters:  04/11/14 183 lb (83.008 kg)  04/11/14 183 lb 6.4 oz (83.19 kg)  02/21/14 181 lb 12.8 oz (82.464 kg)   General appearance: alert, cooperative and no distress Neurologic: intact Heart: regular rate and rhythm, S1, S2 normal, no murmur, click, rub or gallop and normal apical impulse Lungs: clear to auscultation bilaterally and normal percussion bilaterally Abdomen: soft, non-tender; bowel sounds normal; no masses,  no organomegaly Extremities: extremities normal, atraumatic, no cyanosis or edema and Homans sign is negative, no sign of DVT Wound: The right chest wall incisions are all well-healed Patient has no cervical or supraclavicular adenopathy, no carotid bruits no stridor on exam  Diagnostic Studies & Laboratory data:         Recent Radiology Findings: Ct Chest W Contrast  04/03/2014   CLINICAL DATA:  Lung cancer. Partial resection. Status post radiation therapy.  EXAM: CT CHEST WITH CONTRAST  TECHNIQUE: Multidetector CT imaging of the chest was performed during intravenous contrast administration.  CONTRAST:  871mOMNIPAQUE IOHEXOL 300 MG/ML  SOLN  COMPARISON:  01/01/2014.  09/07/2013.  FINDINGS: Soft tissue / Mediastinum: There is no axillary lymphadenopathy. No mediastinal or hilar lymphadenopathy. Surgical changes and post  radiation fibrosis noted in the region of the right hilum, as before. Heart size is normal. No pericardial effusion.  Lungs / Pleura: Postradiation fibrosis in the medial right lung is stable in appearance. 10 mm nodule in the posterior right lung base is more can flow within on the previous study and probably reflects atelectasis. No pulmonary nodule in the left lung. No focal airspace consolidation or pulmonary edema. Small right pleural effusion.  Bones: Bone windows reveal no worrisome lytic or sclerotic osseous lesions.  Upper Abdomen:  Left upper pole renal cyst is unchanged.  IMPRESSION: Stable appearance of postsurgical and radiation change in the  medial right upper lung. No definite disease recurrence.  10 mm nodule in the posterior right lung was not in area of atelectasis on the previous study. This probably reflects coalescence of the atelectatic changes and attention to this area on followup is recommended.   Electronically Signed   By: Misty Stanley M.D.   On: 04/03/2014 13:33  Ct Chest W Contrast  01/01/2014   CLINICAL DATA:  Lung cancer restaging  EXAM: CT CHEST WITH CONTRAST  TECHNIQUE: Multidetector CT imaging of the chest was performed during intravenous contrast administration.  CONTRAST:  80 cc of Omni  COMPARISON:  09/07/2013  FINDINGS: The heart size appears normal. No pericardial effusion. There is no mediastinal or hilar adenopathy. No axillary or supraclavicular adenopathy noted.  Small right pleural effusion is identified and is new from previous exam. Postoperative change and volume loss is identified involving the right hemi thorax. There are progressive radiation changes noted within the right lung. No specific features are identified to suggest local tumor recurrence.  No axillary or supraclavicular adenopathy. Related through the upper abdomen is on unremarkable. There are no acute findings identified. The adrenal glands both appear normal. Stable cyst within the upper pole of the  left kidney measuring 2.4 cm, image 64/series 2.  Review of the visualized osseous structures is significant for mild scoliosis and multilevel spondylosis involving the thoracic spine.  IMPRESSION: 1. Progressive changes of external beam radiation are identified within the right lung. 2. No evidence for residual or recurrence of tumor.   Electronically Signed   By: Kerby Moors M.D.   On: 01/01/2014 14:04   CLINICAL DATA: Status post lung surgery, cough shortness of breath,  hemoptysis  EXAM:  CT ANGIOGRAPHY CHEST WITH CONTRAST  TECHNIQUE:  Multidetector CT imaging of the chest was performed using the  standard protocol during bolus administration of intravenous  contrast. Multiplanar CT image reconstructions including MIPs were  obtained to evaluate the vascular anatomy.  CONTRAST: 137m OMNIPAQUE IOHEXOL 350 MG/ML SOLN  COMPARISON: 06/27/2013  FINDINGS:  The thoracic inlet is unremarkable. The mediastinum demonstrates  linearly oriented increased soft tissue density within the anterior  superior right hilar region extending posteriorly. Linear areas of  high density are appreciated within these findings. Considering the  patient's history these findings abdominal wall appearance of  postsurgical changes. It appears stable when compared to the  previous study. Residual or recurrent disease cannot be excluded.  The There is no evidence of filling defects within the main, lobar,  or segmental pulmonary arteries.  The lung parenchyma demonstrates diffuse airspace disease in the  posterior aspect of the right upper lobe, superior segment right  lower lobe. Mild increased density projects in the the base of the  left lower lobe.  Visualized upper abdominal viscera grossly unremarkable. Multilevel  degenerative changes are appreciated within the thoracic spine.  Review of the MIP images confirms the above findings.  IMPRESSION:  1. No CT evidence of pulmonary arterial embolic disease  2.  Diffuse areas of pneumonitis within the superior segment right  lower lobe and to a lesser extent at the posterior aspect of the  right upper lobe differential considerations are postsurgical  changes versus an infectious or inflammatory pneumonitis. Pulmonary  hemorrhage considering the patient's history is a diagnostic  consideration. Asymmetric edema is of much lower consideration  considering the findings in the remaining portions of the lung.  Electronically Signed  By: HMargaree MackintoshM.D.  On: 09/07/2013 14:26   Ct Chest W  Contrast  06/27/2013   *RADIOLOGY REPORT*  Clinical Data: Staging lung cancer  CT CHEST WITH CONTRAST  Technique:  Multidetector CT imaging of the chest was performed following the standard protocol during bolus administration of intravenous contrast.  Contrast: 36m OMNIPAQUE IOHEXOL 300 MG/ML  SOLN  Comparison: 02/14/2013  Findings: No pleural effusion identified.  The patient is status post right upper lobectomy. There is a new sub solid density within the right lung base measuring 1.1 x 1.8 cm, image 41/series 6. This is new from the previous exam.  Within the left lower lobe there is a 5 mm nodule, image 46/series 5.  Heart size appears normal.  There is no mediastinal or hilar adenopathy identified.  There is no enlarged axillary or supraclavicular adenopathy.  Limited imaging through the upper abdomen shows bilateral renal cysts.  No acute findings identified.  No mass or adenopathy.  Review of the visualized bony structures is significant for mild scoliosis and multilevel spondylosis.  IMPRESSION:  1.  No acute cardiopulmonary abnormalities. 2.  Postoperative changes from right upper lobectomy. 3.  New sub solid density within the right lower lobe is nonspecific and may be treatment related.  Recommend follow-up imaging in 3 months to confirm persistence.   Original Report Authenticated By: TKerby Moors M.D.    Recent Labs: Lab Results  Component Value Date   WBC  7.4 04/03/2014   HGB 15.6 04/03/2014   HCT 47.1 04/03/2014   PLT 206 04/03/2014   GLUCOSE 100 04/03/2014   ALT 18 04/03/2014   AST 16 04/03/2014   NA 143 04/03/2014   K 4.3 04/03/2014   CL 100 09/07/2013   CREATININE 0.9 04/03/2014   BUN 15.2 04/03/2014   CO2 20* 04/03/2014   INR 0.99 02/20/2013   PATH: stage IIIA,  EGFR: Exon 19 deletion  Mutation detected Diagnosis 1. Lung, resection (segmental or lobe), Right upper lobe - ADENOCARCINOMA, 3.4 CM. - MARGINS NOT INVOLVED. - VISCERAL PLEURA FREE OF TUMOR. - ONE BENIGN LYMPH NODE. 2. Lymph node, biopsy, Right upper 4 R - METASTATIC ADENOCARCINOMA. 3. Lymph node, biopsy, Right 2 R - ANTHRACOTIC LYMPH NODE. NO TUMOR IDENTIFIED. Microscopic Comment 1. LUNG Specimen, including laterality: Right lung lobe. Procedure: Lobectomy. Specimen integrity (intact/disrupted): Intact. Tumor site: Right upper lobe. Tumor focality: Unifocal Maximum tumor size (cm): 3.4 cm. Histologic type: Adenocarcinoma, bronchioalveolar cell type. Grade: I. Margins: Free of tumor. Visceral pleura invasion: No. Tumor extension: Within lobe. Treatment effect (if treated with neoadjuvant therapy): No. Lymph -Vascular invasion: Present. Lymph nodes: Number examined - 3 ; Number N1 nodes positive - 0 ; Number N2 nodes positive - 1 TNM code: pT2a , pN2, pMX Ancillary studies: EGFR and ALK pending. Non-neoplastic lung: Unremarkable     Assessment / Plan:    Stable following resection of right upper lobe, lung cancer which was found to be EGFR positive adenocarcinoma stage IIIa. With postoperative radiation changes present on recent CT scan and no evidence of recurrent disease, there is an area of atelectasis or pseudotumor along the fissure in the right upper lobe but will be followed on subsequent CT scans. Patient has a CT scan for 3 months apart he scheduled by Dr. MJulien Nordmann I'll plan on seeing the patient following the CT scan   Derricka Mertz B 04/11/2014 2:02  PM

## 2014-04-11 NOTE — Progress Notes (Signed)
[facility] [phone]   [phone]  OFFICE PROGRESS NOTE  Jerlyn Ly, MD [place] [address]  DIAGNOSIS AND STAGE: Stage III A (T2 A., N2, M0) non-small cell lung cancer, adenocarcinoma with positive EGFR mutation in exon 19 and negative ALK gene translocation diagnosed in April 2014   PRIOR THERAPY:  1) Status post right upper lobectomy with lymph node dissection under the care of Dr. Servando Snare on 02/21/2013. 2) Systemic adjuvant chemotherapy with carboplatin for AUC of 5 and Alimta 500 mg/M2 every 3 weeks. Status post 4 cycles. Last cycle was given on 05/29/2013. (Carboplatin was used in place of cisplatin secondary to concern about intolerability and significant adverse effects of cisplatin).  3) Adjuvant radiotherapy to the mediastinum under the care of Dr. Lisbeth Renshaw completed 08/27/2013.   CURRENT THERAPY: Observation  CHEMOTHERAPY INTENT: adjuvant/curative  CURRENT # OF CHEMOTHERAPY CYCLES: 0 CURRENT ANTIEMETICS: Zofran, Decadron and Compazine  CURRENT SMOKING STATUS: currently a nonsmoker  ORAL CHEMOTHERAPY AND CONSENT: None  CURRENT BISPHOSPHONATES USE: None  PAIN MANAGEMENT: no pain  NARCOTICS INDUCED CONSTIPATION: N/A  LIVING WILL AND CODE STATUS: Full Code.   INTERVAL HISTORY: Alvin Hardy 79 y.o. male returns to the clinic today for followup visit. He has been observation for the last 3 months with no significant complaints. The patient denied having any significant fever or chills. He has no nausea or vomiting. He denied having any significant chest pain, shortness of breath, cough or hemoptysis. He had repeat CT scan of the chest performed recently and he is here for evaluation and discussion of his scan results.  MEDICAL HISTORY: Past Medical History  Diagnosis Date  . BPH (benign prostatic hyperplasia)   . Hypogonadism male   . Bipolar 1 disorder   . ED (erectile dysfunction)   . Hyperlipidemia   .  Neuromuscular disorder     peripheral neuropathy  . Cataract 2010    Bilateral  . Laryngopharyngeal reflux   . Gilbert's syndrome   . Deviated septum     TO THE LEFT  . Morton's neuroma     LEFT FOOT  . Lung mass 02/07/13    RIGHT UPPER LOBE  . Shortness of breath     voice breaks, ? related to reflux, although told by Dr. Ardis Hughs, S.- early 79 yr. old  . GERD (gastroesophageal reflux disease)   . Arthritis     psoriatic arthritis, ? , treated /w mmethotrexate   . Finger injury     mallet finger-- 02/13/2013, cast in place, followed by dr. Fredna Dow  . Allergy   . Depression   . History of radiation therapy 07/24/13-08/27/13    50Gy/25 fx chest  . lung ca dx'd 12/2012    rul  . Chest pain     ALLERGIES:  is allergic to codeine; decadron; other; statins; zetia; and zoloft.  MEDICATIONS:  Current Outpatient Prescriptions  Medication Sig Dispense Refill  . aspirin EC 81 MG tablet Take 81 mg by mouth every morning.       . calcipotriene (DOVONOX) 0.005 % cream Apply 1 application topically daily as needed (for hands).       . Calcium Carbonate-Vitamin D (CALTRATE 600+D) 600-400 MG-UNIT per tablet Take 1 tablet by mouth every morning.      . Cholecalciferol (VITAMIN D-3) 1000 UNITS CAPS Take 1,000 Units by mouth every morning.       . clobetasol cream (TEMOVATE) 4.53 % Apply 1 application topically  daily.       . clonazePAM (KLONOPIN) 0.5 MG tablet Take 0.5 mg by mouth at bedtime.      . cyanocobalamin 1000 MCG tablet Take 1,000 mcg by mouth every morning.       Marland Kitchen esomeprazole (NEXIUM) 40 MG capsule Take 40 mg by mouth daily before breakfast.      . finasteride (PROSCAR) 5 MG tablet Take 5 mg by mouth daily before breakfast.       . gabapentin (NEURONTIN) 300 MG capsule Take 600-900 mg by mouth 2 (two) times daily. He takes two capsules every morning and three capsules at bedtime.      Marland Kitchen lithium carbonate (LITHOBID) 300 MG CR tablet Take 300 mg by mouth at bedtime.      . Multiple  Vitamin (MULTIVITAMIN WITH MINERALS) TABS tablet Take 1 tablet by mouth every morning.      Marland Kitchen OVER THE COUNTER MEDICATION Take 1 tablet by mouth 2 (two) times daily. Glucosamine Chondroitin 1567m/1200mg      . propranolol (INDERAL) 10 MG tablet Take 10-20 mg by mouth daily as needed (For extremely stressful circumstances.).       .Marland Kitchenrosuvastatin (CRESTOR) 10 MG tablet Take 1 tablet (10 mg total) by mouth once a week.  90 tablet  3  . testosterone cypionate (DEPOTESTOTERONE CYPIONATE) 100 MG/ML injection Inject 100 mg into the muscle every Friday.       . vardenafil (LEVITRA) 5 MG tablet Take 5 mg by mouth daily as needed for erectile dysfunction.        No current facility-administered medications for this visit.    SURGICAL HISTORY:  Past Surgical History  Procedure Laterality Date  . Prostate surgery  1996    TURP  . Knee arthroscopy Right 01/2007  . Pilonidal cyst excision  1960's  . Eye surgery      cataracts removed fr. both eyes, IOL in place   . Video assisted thoracoscopy (vats)/wedge resection Right 02/21/2013    Procedure: Right VIDEO ASSISTED THORACOSCOPY ,Thoracotomy with right upper lobectomy, node sampling ;  Surgeon: EGrace Isaac MD;  [facility]  Service: Thoracic;  Laterality: Right;  . Video bronchoscopy N/A 02/21/2013    Procedure: VIDEO BRONCHOSCOPY;  Surgeon: EGrace Isaac MD;  [facility]  Service: Thoracic;  Laterality: N/A;  . Cystoscopy N/A 02/21/2013    Procedure: CYSTOSCOPY FLEXIBLE with insertion of foley catheter;  Surgeon: MFredricka Bonine MD;  [facility]  Service: Urology;  Laterality: N/A;    REVIEW OF SYSTEMS:  Constitutional: negative Eyes: negative Ears, nose, mouth, throat, and face: negative Respiratory: negative Cardiovascular: negative Gastrointestinal: negative Genitourinary:negative Integument/breast: negative Hematologic/lymphatic: negative Musculoskeletal:negative Neurological: negative Behavioral/Psych:  negative Endocrine: negative Allergic/Immunologic: negative   PHYSICAL EXAMINATION: General appearance: alert, cooperative and no distress Head: Normocephalic, without obvious abnormality, atraumatic Neck: no adenopathy Lymph nodes: Cervical, supraclavicular, and axillary nodes normal. Resp: clear to auscultation bilaterally Cardio: regular rate and rhythm, S1, S2 normal, no murmur, click, rub or gallop GI: soft, non-tender; bowel sounds normal; no masses,  no organomegaly Extremities: extremities normal, atraumatic, no cyanosis or edema Neurologic: Alert and oriented X 3, normal strength and tone. Normal symmetric reflexes. Normal coordination and gait  ECOG PERFORMANCE STATUS: 0 - Asymptomatic  Blood pressure 142/73, pulse 84, temperature 97.9 F (36.6 C), temperature source Oral, resp. rate 18, height _0  (1.803 m), weight 183 lb 6.4 oz (83.19 kg), SpO2 98.00%.  LABORATORY DATA: Lab Results  Component Value Date  WBC 7.4 04/03/2014   HGB 15.6 04/03/2014   HCT 47.1 04/03/2014   MCV 89.4 04/03/2014   PLT 206 04/03/2014      Chemistry      Component Value Date/Time   NA 143 04/03/2014 1056   NA 135 09/07/2013 1145   K 4.3 04/03/2014 1056   K 5.0 09/07/2013 1145   CL 100 09/07/2013 1145   CL 107 04/24/2013 1110   CO2 20* 04/03/2014 1056   CO2 23 09/07/2013 1145   BUN 15.2 04/03/2014 1056   BUN 15 09/07/2013 1145   CREATININE 0.9 04/03/2014 1056   CREATININE 0.80 09/07/2013 1145      Component Value Date/Time   CALCIUM 9.2 04/03/2014 1056   CALCIUM 9.7 09/07/2013 1145   ALKPHOS 60 04/03/2014 1056   ALKPHOS 63 09/07/2013 1145   AST 16 04/03/2014 1056   AST 37 09/07/2013 1145   ALT 18 04/03/2014 1056   ALT 28 09/07/2013 1145   BILITOT 1.14 04/03/2014 1056   BILITOT 0.9 09/07/2013 1145       RADIOGRAPHIC STUDIES: Ct Chest W Contrast  04/03/2014   CLINICAL DATA:  Lung cancer. Partial resection. Status post radiation therapy.  EXAM: CT CHEST WITH CONTRAST  TECHNIQUE: Multidetector CT imaging of the  chest was performed during intravenous contrast administration.  CONTRAST:  11m OMNIPAQUE IOHEXOL 300 MG/ML  SOLN  COMPARISON:  01/01/2014.  09/07/2013.  FINDINGS: Soft tissue / Mediastinum: There is no axillary lymphadenopathy. No mediastinal or hilar lymphadenopathy. Surgical changes and post radiation fibrosis noted in the region of the right hilum, as before. Heart size is normal. No pericardial effusion.  Lungs / Pleura: Postradiation fibrosis in the medial right lung is stable in appearance. 10 mm nodule in the posterior right lung base is more can flow within on the previous study and probably reflects atelectasis. No pulmonary nodule in the left lung. No focal airspace consolidation or pulmonary edema. Small right pleural effusion.  Bones: Bone windows reveal no worrisome lytic or sclerotic osseous lesions.  Upper Abdomen:  Left upper pole renal cyst is unchanged.  IMPRESSION: Stable appearance of postsurgical and radiation change in the medial right upper lung. No definite disease recurrence.  10 mm nodule in the posterior right lung was not in area of atelectasis on the previous study. This probably reflects coalescence of the atelectatic changes and attention to this area on followup is recommended.   Electronically Signed   By: EMisty StanleyM.D.   On: 04/03/2014 13:33   ASSESSMENT AND PLAN: This is a very pleasant 79years old white male with history of stage IIIa non-small cell lung cancer, adenocarcinoma with positive EGFR mutation status post right upper lobectomy with lymph node dissection followed by 4 cycles of adjuvant chemotherapy. His also status post adjuvant radiotherapy to the mediastinum. He continues to do very well with no specific complaints today. His recent scan showed no evidence for disease recurrence but progressive changes of external beam radiation. I discussed the scan results with the patient and recommended for him to continue on observation with repeat CT scan of the  chest in 3 months. The patient was advised to call immediately if he has any concerning symptoms in the interval.  The patient voices understanding of current disease status and treatment options and is in agreement with the current care plan.  All questions were answered. The patient knows to call the clinic with any problems, questions or concerns. We can certainly see the patient much sooner  if necessary.  Disclaimer: This note was dictated with voice recognition software. Similar sounding words can inadvertently be transcribed and may not be corrected upon review.

## 2014-05-02 NOTE — Telephone Encounter (Signed)
Encounter complete. 

## 2014-05-14 ENCOUNTER — Encounter (HOSPITAL_COMMUNITY): Payer: Self-pay

## 2014-05-23 IMAGING — CR DG CHEST 2V
2 series · 2 of 2 positions shown · non-contrast
Comparison: 03/15/2013, 06/27/2013

CLINICAL DATA: Chest pain, previous right upper lobectomy for lung
cancer

EXAM:
CHEST  2 VIEW

[x chest ap]
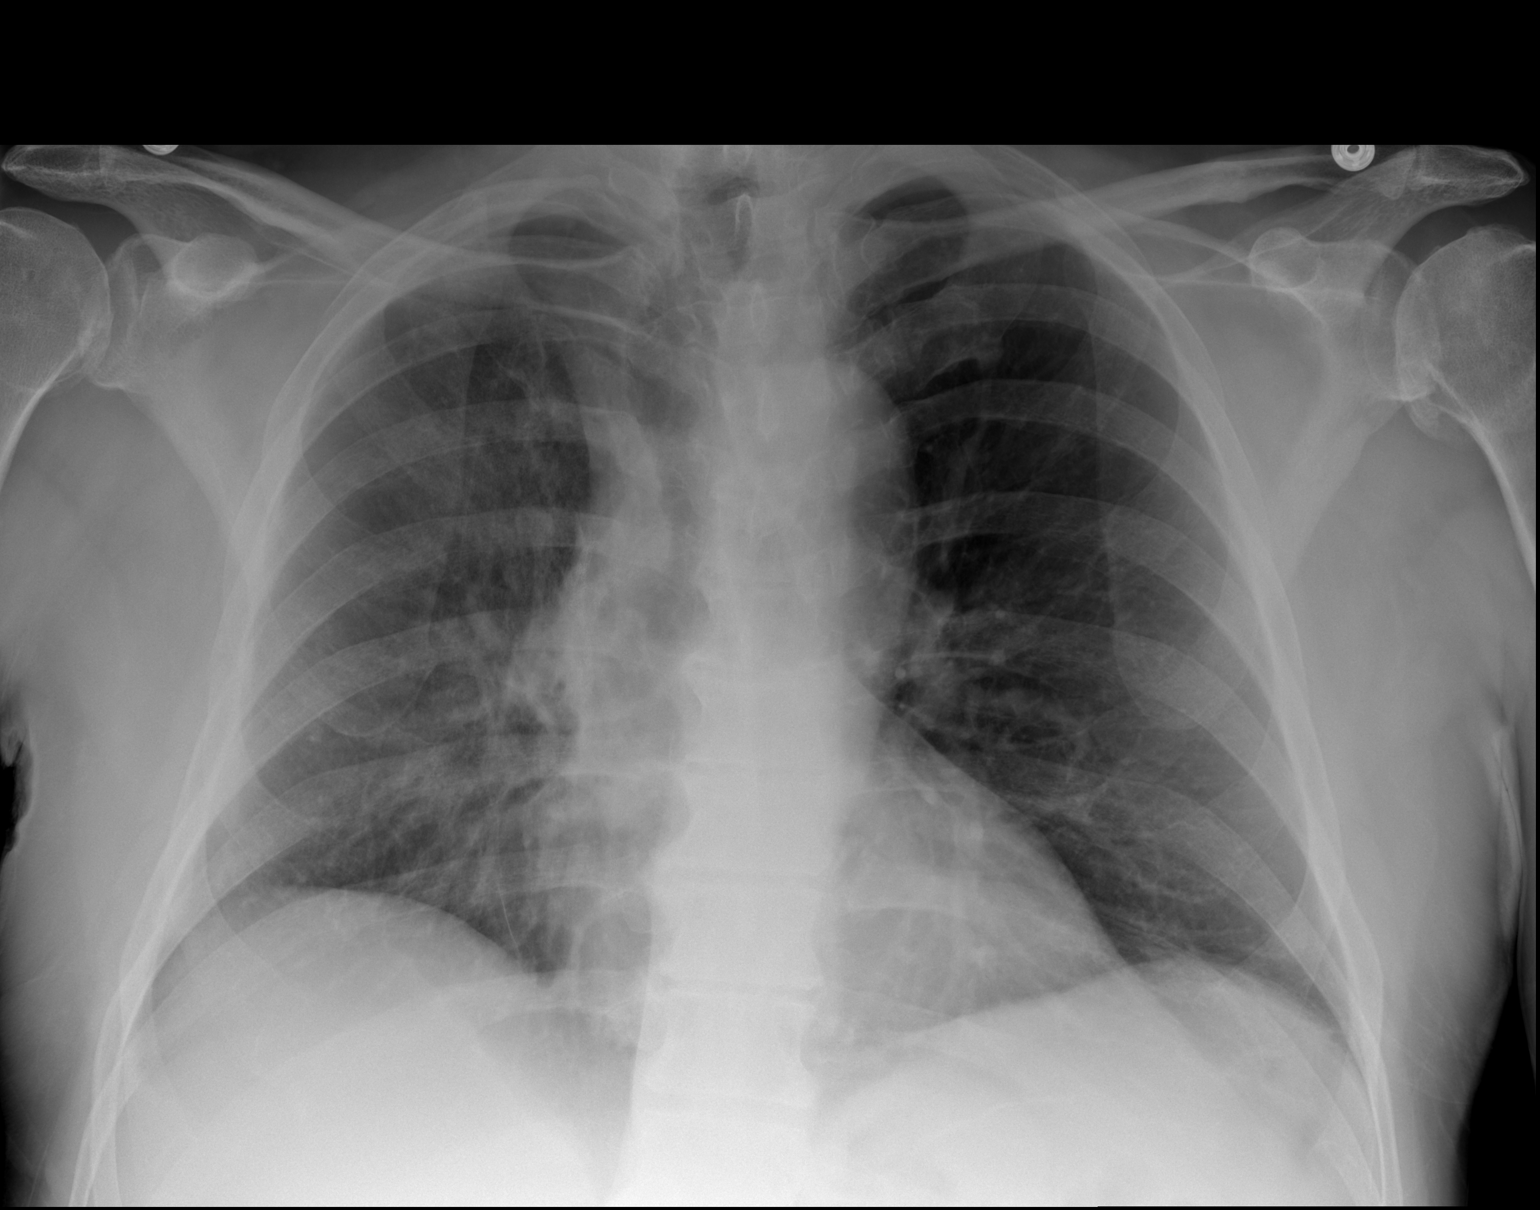

[w chest lat]
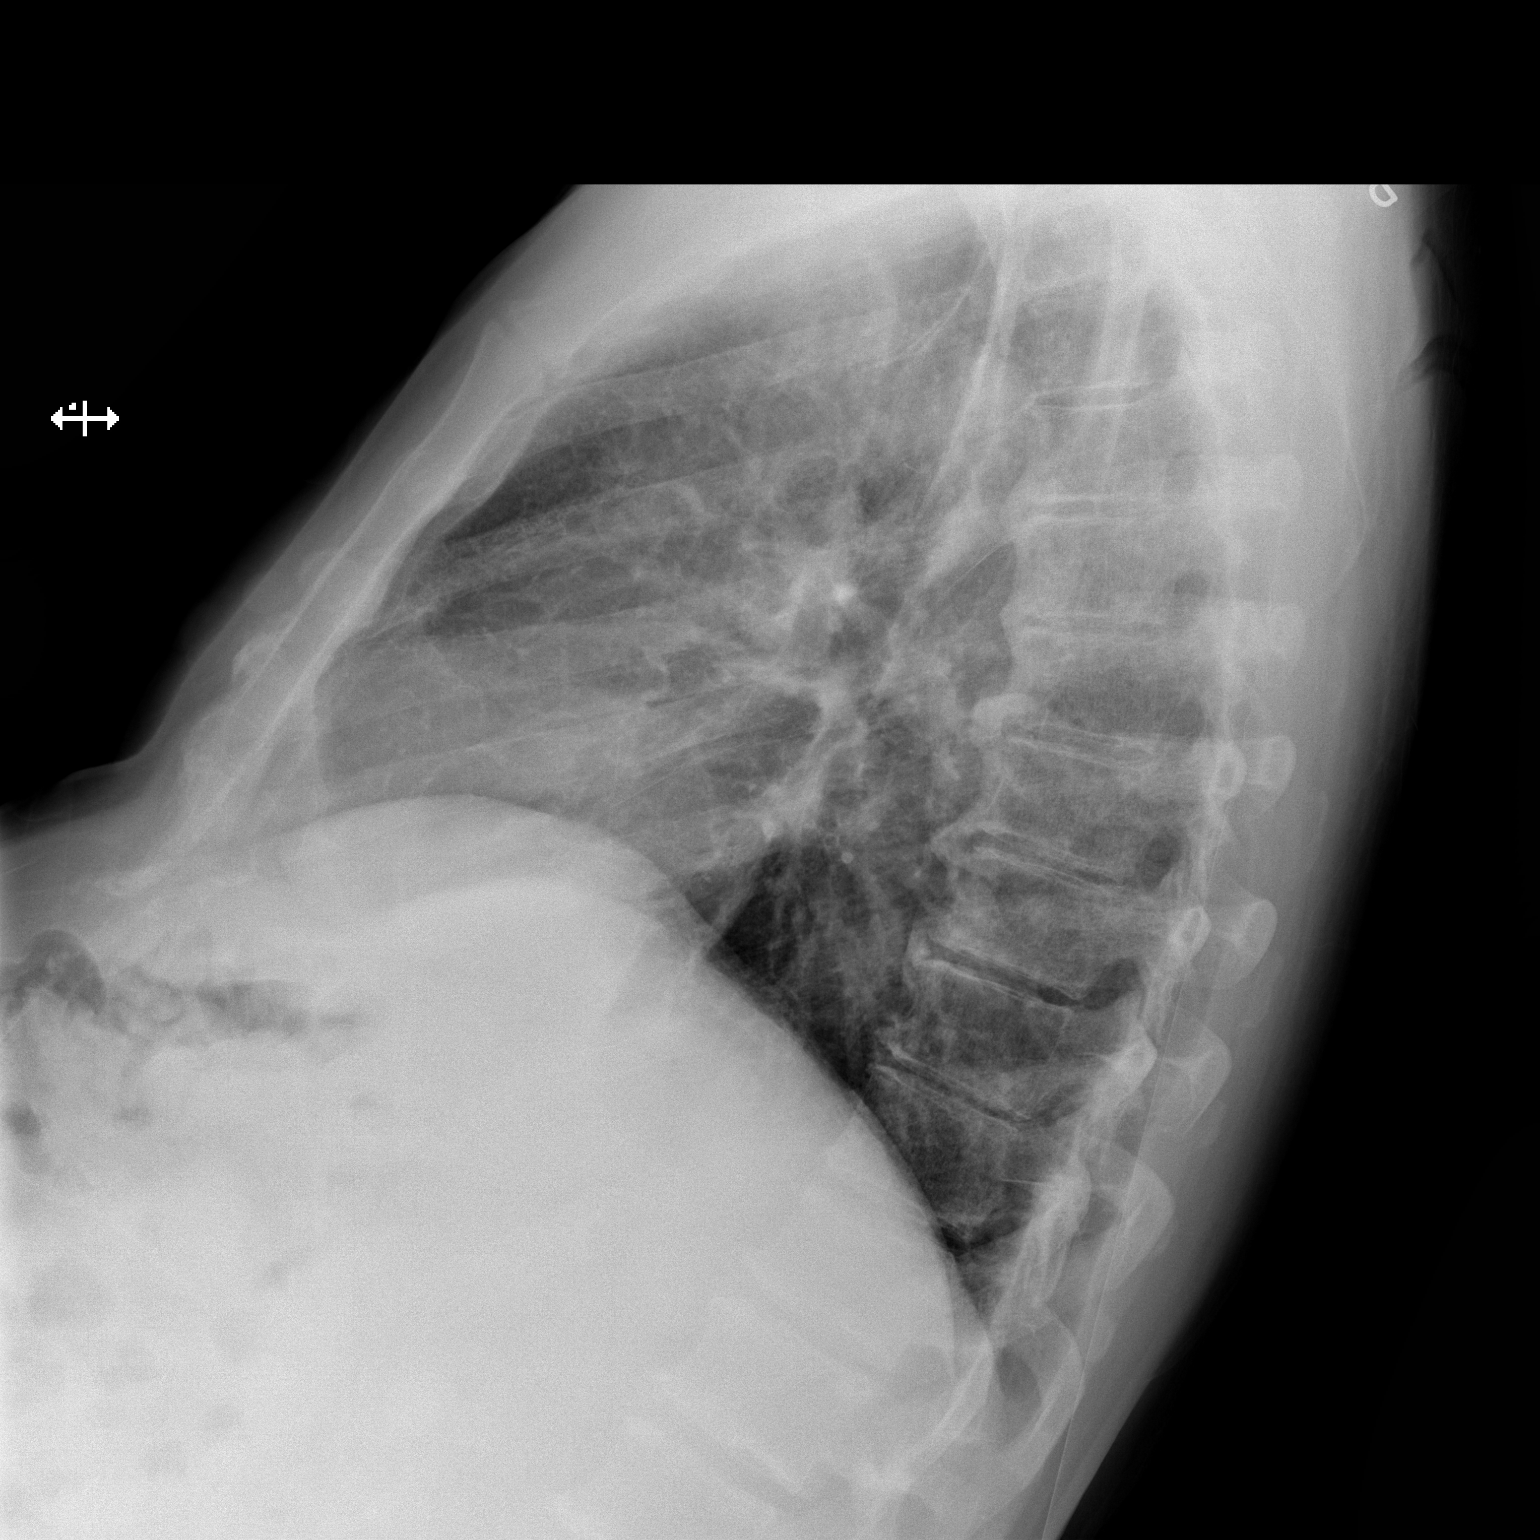

[2 of 2 positions shown; findings below may reference images not displayed]

FINDINGS: Chronic postop changes in the right hemi thorax with volume loss.
Patient is status post right upper lobectomy. Normal heart size and
vascularity. No definite superimposed pneumonia, collapse or
consolidation. No effusion or pneumothorax. Stable rightward
tracheal deviation. Degenerative changes of the spine.
IMPRESSION: Stable postoperative changes in the right hemi thorax. No
superimposed acute process

## 2014-06-18 ENCOUNTER — Other Ambulatory Visit: Payer: Federal, State, Local not specified - PPO

## 2014-06-19 ENCOUNTER — Other Ambulatory Visit (INDEPENDENT_AMBULATORY_CARE_PROVIDER_SITE_OTHER): Payer: Federal, State, Local not specified - PPO

## 2014-06-19 ENCOUNTER — Other Ambulatory Visit: Payer: Self-pay | Admitting: Dermatology

## 2014-06-19 DIAGNOSIS — E785 Hyperlipidemia, unspecified: Secondary | ICD-10-CM

## 2014-06-19 DIAGNOSIS — Z79899 Other long term (current) drug therapy: Secondary | ICD-10-CM

## 2014-06-19 LAB — LIPID PANEL
CHOL/HDL RATIO: 5
Cholesterol: 206 mg/dL — ABNORMAL HIGH (ref 0–200)
HDL: 38.5 mg/dL — AB (ref >39.00)
NONHDL: 167.5
Triglycerides: 216 mg/dL — ABNORMAL HIGH (ref 0.0–149.0)
VLDL: 43.2 mg/dL — ABNORMAL HIGH (ref 0.0–40.0)

## 2014-06-19 LAB — HEPATIC FUNCTION PANEL
ALT: 19 U/L (ref 0–53)
AST: 19 U/L (ref 0–37)
Albumin: 3.8 g/dL (ref 3.5–5.2)
Alkaline Phosphatase: 45 U/L (ref 39–117)
BILIRUBIN TOTAL: 1.2 mg/dL (ref 0.2–1.2)
Bilirubin, Direct: 0.1 mg/dL (ref 0.0–0.3)
Total Protein: 6.5 g/dL (ref 6.0–8.3)

## 2014-06-19 LAB — LDL CHOLESTEROL, DIRECT: Direct LDL: 144 mg/dL

## 2014-06-20 ENCOUNTER — Ambulatory Visit (INDEPENDENT_AMBULATORY_CARE_PROVIDER_SITE_OTHER): Payer: Federal, State, Local not specified - PPO | Admitting: Pharmacist

## 2014-06-20 DIAGNOSIS — Z79899 Other long term (current) drug therapy: Secondary | ICD-10-CM

## 2014-06-20 DIAGNOSIS — E785 Hyperlipidemia, unspecified: Secondary | ICD-10-CM

## 2014-06-20 MED ORDER — ROSUVASTATIN CALCIUM 10 MG PO TABS: 10.0000 mg | ORAL_TABLET | ORAL | Status: DC

## 2014-06-20 NOTE — Assessment & Plan Note (Signed)
Explained to patient that he was not a candidate for Praluent given he doesn't have ASCVD or FH.  Patient tolerated Crestor 10 mg qweek and willing to restart this.  Would like to get LDL to at least < 130 mg/dL which should not be a problem with once weekly Crestor since his current LDL is 144 mg/dL, and once weekly Crestor typically can achieve a 20% reduction.  Will check lipid / liver in 4 months and just call with result as unlikely to be able to use a more potent regimen in the future.  Patient agreeable, and will call if he has problems.

## 2014-06-20 NOTE — Patient Instructions (Signed)
1.  Get back on Crestor 10 mg once weekly. 2.  Recheck cholesterol 10/15/14 - fasting lab.  Will call you with result.

## 2014-06-20 NOTE — Progress Notes (Signed)
Patient is a pleasant 79 y.o. WM referred to lipid clinic by Dr. Gwenlyn Found due to failing multiple statins, having elevated LDL, and having complaints of chest pains.  Patient had a nuclear stress test and and ABI performed recently, and both were normal.  Patient started Crestor 10 mg once weekly in 01/2014 and tolerated this well.  Patient however stopped taking it in 04/2014 due to just forgetting. His LDL has improved from 156 mg/dL down to 144 mg/dL despite not taking Crestor for past 4-6 weeks.  Patient's father had an MI at age 21 y.o..  He was diagnosed with lung cancer last year, which was successfully treated with chemo + radiation.  He developed leg aches with Zocor, Lipitor, Crestor, and Zetia in the past he tells me.  He states he took Lyons, but not sure if he ever failed it, but didn't like having to take the powder or 6 pills daily.  He is taking Vitamin D 1,000 units daily.  He lives a fairly active lifestyle.  Patient asked about Praluent today.  RF:  Family h/o CAD, age - LDL goal < 130 at least, non-HDL goal < 160 at least Meds:  Not on lipid lowering therapy (stopped Crestor 10 mg qweek back in 04/2014 due to forgetting to take - tolerated it well) Intolerant:  Zocor, Lipitor, Crestor, Zetia (all caused muscle aches in legs after being on medication for a few weeks).  Took Welchol but stopped it on own.  Social history:  Retired disability judge.  Former smoker (quit 30 years ago).  Drinks alcohol occasionally. Family history:  Father had an MI at 27 y.o. ; mother and sister both with diabetes Diet:  Eats a relatively low fat diet.  Last known cholesterol labs: 06/2014:  TC 206, LDL 144, non-HDL 168, HDL 39, TG 216 (off Crestor 10 mg qweek six weeks prior to these labs) Old labs (? Date)TC 248, LDL 156, HDL 44   Current Outpatient Prescriptions  Medication Sig Dispense Refill  . aspirin EC 81 MG tablet Take 81 mg by mouth every morning.       . calcipotriene (DOVONOX) 0.005 % cream  Apply 1 application topically daily as needed (for hands).       . Calcium Carbonate-Vitamin D (CALTRATE 600+D) 600-400 MG-UNIT per tablet Take 1 tablet by mouth every morning.      . Cholecalciferol (VITAMIN D-3) 1000 UNITS CAPS Take 1,000 Units by mouth every morning.       . clobetasol cream (TEMOVATE) 2.02 % Apply 1 application topically daily.       . clonazePAM (KLONOPIN) 0.5 MG tablet Take 0.5 mg by mouth at bedtime.      . cyanocobalamin 1000 MCG tablet Take 1,000 mcg by mouth every morning.       Marland Kitchen esomeprazole (NEXIUM) 40 MG capsule Take 40 mg by mouth daily before breakfast.      . finasteride (PROSCAR) 5 MG tablet Take 5 mg by mouth daily before breakfast.       . gabapentin (NEURONTIN) 300 MG capsule Take 600-900 mg by mouth 2 (two) times daily. He takes two capsules every morning and three capsules at bedtime.      Marland Kitchen lithium carbonate (LITHOBID) 300 MG CR tablet Take 300 mg by mouth at bedtime.      . Multiple Vitamin (MULTIVITAMIN WITH MINERALS) TABS tablet Take 1 tablet by mouth every morning.      Marland Kitchen OVER THE COUNTER MEDICATION Take 1 tablet by mouth  2 (two) times daily. Glucosamine Chondroitin 1500mg /1200mg       . propranolol (INDERAL) 10 MG tablet Take 10-20 mg by mouth daily as needed (For extremely stressful circumstances.).       Marland Kitchen testosterone cypionate (DEPOTESTOTERONE CYPIONATE) 100 MG/ML injection Inject 100 mg into the muscle every Friday.       . vardenafil (LEVITRA) 5 MG tablet Take 5 mg by mouth daily as needed for erectile dysfunction.       . rosuvastatin (CRESTOR) 10 MG tablet Take 1 tablet (10 mg total) by mouth once a week.  90 tablet  3   No current facility-administered medications for this visit.   Allergies  Allergen Reactions  . Codeine Anaphylaxis  . Decadron [Dexamethasone]     Pt says it is contraindication due to the lithium he is taking  . Other     Pt. Remarks that all pain med. Make him nauseated   . Statins     Lipitor, Crestor, Zocor all  caused muscle aches  . Zetia [Ezetimibe]     Muscle aches  . Zoloft [Sertraline Hcl] Other (See Comments)    REACTION:  unknown   Family History  Problem Relation Age of Onset  . Diabetes Mother   . Cancer Mother     abdomen  . Heart disease Father   . Diabetes Son   . Cancer Maternal Aunt     lung ca, smoker

## 2014-06-27 ENCOUNTER — Telehealth: Payer: Self-pay | Admitting: Pharmacist

## 2014-06-27 ENCOUNTER — Other Ambulatory Visit: Payer: Self-pay | Admitting: Dermatology

## 2014-06-27 NOTE — Telephone Encounter (Signed)
New message     For Alvin Hardy Pt sees [facility] for lipid clinic.  He said his ins company is raising the price on crestor.  He will not buy it.  Please call in a generic to [facility].  If any problems, please call

## 2014-06-27 NOTE — Telephone Encounter (Signed)
Patient suppose to be on Crestor 10 mg once weekly.  Samples left up from #28, which will last 7 months.  Will reassess lipid panel in 4 months.  Patient notified.

## 2014-07-11 ENCOUNTER — Other Ambulatory Visit (HOSPITAL_BASED_OUTPATIENT_CLINIC_OR_DEPARTMENT_OTHER): Payer: Federal, State, Local not specified - PPO

## 2014-07-11 ENCOUNTER — Encounter (HOSPITAL_COMMUNITY): Payer: Self-pay

## 2014-07-11 ENCOUNTER — Ambulatory Visit (HOSPITAL_COMMUNITY)
Admission: RE | Admit: 2014-07-11 | Discharge: 2014-07-11 | Disposition: A | Discharge status: Home / Self Care | Payer: Federal, State, Local not specified - PPO | Source: Ambulatory Visit | Attending: Internal Medicine | Admitting: Internal Medicine

## 2014-07-11 DIAGNOSIS — R911 Solitary pulmonary nodule: Secondary | ICD-10-CM | POA: Diagnosis not present

## 2014-07-11 DIAGNOSIS — M47814 Spondylosis without myelopathy or radiculopathy, thoracic region: Secondary | ICD-10-CM | POA: Insufficient documentation

## 2014-07-11 DIAGNOSIS — Z923 Personal history of irradiation: Secondary | ICD-10-CM | POA: Diagnosis not present

## 2014-07-11 DIAGNOSIS — C3490 Malignant neoplasm of unspecified part of unspecified bronchus or lung: Secondary | ICD-10-CM | POA: Insufficient documentation

## 2014-07-11 DIAGNOSIS — C3410 Malignant neoplasm of upper lobe, unspecified bronchus or lung: Secondary | ICD-10-CM

## 2014-07-11 DIAGNOSIS — J90 Pleural effusion, not elsewhere classified: Secondary | ICD-10-CM | POA: Diagnosis not present

## 2014-07-11 LAB — COMPREHENSIVE METABOLIC PANEL (CC13)
ALBUMIN: 4 g/dL (ref 3.5–5.0)
ALK PHOS: 59 U/L (ref 40–150)
ALT: 17 U/L (ref 0–55)
AST: 17 U/L (ref 5–34)
Anion Gap: 7 mEq/L (ref 3–11)
BUN: 14.3 mg/dL (ref 7.0–26.0)
CALCIUM: 9.1 mg/dL (ref 8.4–10.4)
CHLORIDE: 108 meq/L (ref 98–109)
CO2: 25 mEq/L (ref 22–29)
Creatinine: 1 mg/dL (ref 0.7–1.3)
Glucose: 95 mg/dl (ref 70–140)
Potassium: 4.5 mEq/L (ref 3.5–5.1)
SODIUM: 141 meq/L (ref 136–145)
TOTAL PROTEIN: 6.8 g/dL (ref 6.4–8.3)
Total Bilirubin: 1.24 mg/dL — ABNORMAL HIGH (ref 0.20–1.20)

## 2014-07-11 LAB — CBC WITH DIFFERENTIAL/PLATELET
BASO%: 0.7 % (ref 0.0–2.0)
Basophils Absolute: 0.1 10*3/uL (ref 0.0–0.1)
EOS%: 3.1 % (ref 0.0–7.0)
Eosinophils Absolute: 0.2 10*3/uL (ref 0.0–0.5)
HCT: 46.5 % (ref 38.4–49.9)
HGB: 15.8 g/dL (ref 13.0–17.1)
LYMPH%: 21.8 % (ref 14.0–49.0)
MCH: 29.7 pg (ref 27.2–33.4)
MCHC: 34 g/dL (ref 32.0–36.0)
MCV: 87.4 fL (ref 79.3–98.0)
MONO#: 0.6 10*3/uL (ref 0.1–0.9)
MONO%: 7.7 % (ref 0.0–14.0)
NEUT#: 5.1 10*3/uL (ref 1.5–6.5)
NEUT%: 66.7 % (ref 39.0–75.0)
PLATELETS: 186 10*3/uL (ref 140–400)
RBC: 5.32 10*6/uL (ref 4.20–5.82)
RDW: 13.2 % (ref 11.0–14.6)
WBC: 7.7 10*3/uL (ref 4.0–10.3)
lymph#: 1.7 10*3/uL (ref 0.9–3.3)

## 2014-07-11 MED ORDER — IOHEXOL 300 MG/ML  SOLN
80.0000 mL | Freq: Once | INTRAMUSCULAR | Status: AC | PRN
  Administered 2014-07-11: 80 mL via INTRAVENOUS

## 2014-07-17 ENCOUNTER — Ambulatory Visit (HOSPITAL_BASED_OUTPATIENT_CLINIC_OR_DEPARTMENT_OTHER): Payer: Federal, State, Local not specified - PPO | Admitting: Internal Medicine

## 2014-07-17 ENCOUNTER — Encounter: Payer: Self-pay | Admitting: Internal Medicine

## 2014-07-17 ENCOUNTER — Other Ambulatory Visit: Payer: Self-pay | Admitting: Dermatology

## 2014-07-17 VITALS — BP 130/78 | HR 81 | Temp 98.3°F | Resp 18 | Ht 70.0 in | Wt 186.1 lb

## 2014-07-17 DIAGNOSIS — C3410 Malignant neoplasm of upper lobe, unspecified bronchus or lung: Secondary | ICD-10-CM

## 2014-07-17 DIAGNOSIS — C3411 Malignant neoplasm of upper lobe, right bronchus or lung: Secondary | ICD-10-CM

## 2014-07-17 NOTE — Progress Notes (Signed)
[place] [phone]   [phone]  OFFICE PROGRESS NOTE  Jerlyn Ly, MD [place] [address]  DIAGNOSIS AND STAGE: Stage III A (T2 A., N2, M0) non-small cell lung cancer, adenocarcinoma with positive EGFR mutation in exon 19 and negative ALK gene translocation diagnosed in April 2014   PRIOR THERAPY:  1) Status post right upper lobectomy with lymph node dissection under the care of Dr. Servando Snare on 02/21/2013. 2) Systemic adjuvant chemotherapy with carboplatin for AUC of 5 and Alimta 500 mg/M2 every 3 weeks. Status post 4 cycles. Last cycle was given on 05/29/2013. (Carboplatin was used in place of cisplatin secondary to concern about intolerability and significant adverse effects of cisplatin).  3) Adjuvant radiotherapy to the mediastinum under the care of Dr. Lisbeth Renshaw completed 08/27/2013.   CURRENT THERAPY: Observation  CHEMOTHERAPY INTENT: adjuvant/curative  CURRENT # OF CHEMOTHERAPY CYCLES: 0 CURRENT ANTIEMETICS: Zofran, Decadron and Compazine  CURRENT SMOKING STATUS: currently a nonsmoker  ORAL CHEMOTHERAPY AND CONSENT: None  CURRENT BISPHOSPHONATES USE: None  PAIN MANAGEMENT: no pain  NARCOTICS INDUCED CONSTIPATION: N/A  LIVING WILL AND CODE STATUS: Full Code.   INTERVAL HISTORY: Alvin Hardy 79 y.o. male returns to the clinic today for three-month followup visit followup visit. He has no complaints today. The patient denied having any significant fever or chills. He has no nausea or vomiting. He denied having any significant chest pain, shortness of breath, cough or hemoptysis. He had repeat CT scan of the chest performed recently and he is here for evaluation and discussion of his scan results.  MEDICAL HISTORY: Past Medical History  Diagnosis Date  . BPH (benign prostatic hyperplasia)   . Hypogonadism male   . Bipolar 1 disorder   . ED (erectile dysfunction)   . Hyperlipidemia   . Neuromuscular disorder      peripheral neuropathy  . Cataract 2010    Bilateral  . Laryngopharyngeal reflux   . Gilbert's syndrome   . Deviated septum     TO THE LEFT  . Morton's neuroma     LEFT FOOT  . Lung mass 02/07/13    RIGHT UPPER LOBE  . Shortness of breath     voice breaks, ? related to reflux, although told by Dr. Ardis Hughs, S.- early 66 yr. old  . GERD (gastroesophageal reflux disease)   . Arthritis     psoriatic arthritis, ? , treated /w mmethotrexate   . Finger injury     mallet finger-- 02/13/2013, cast in place, followed by dr. Fredna Dow  . Allergy   . Depression   . History of radiation therapy 07/24/13-08/27/13    50Gy/25 fx chest  . lung ca dx'd 12/2012    rul  . Chest pain     ALLERGIES:  is allergic to codeine; decadron; other; statins; zetia; and zoloft.  MEDICATIONS:  Current Outpatient Prescriptions  Medication Sig Dispense Refill  . aspirin EC 81 MG tablet Take 81 mg by mouth every morning.       . calcipotriene (DOVONOX) 0.005 % cream Apply 1 application topically daily as needed (for hands).       . Calcium Carbonate-Vitamin D (CALTRATE 600+D) 600-400 MG-UNIT per tablet Take 1 tablet by mouth every morning.      . Cholecalciferol (VITAMIN D-3) 1000 UNITS CAPS Take 1,000 Units by mouth every morning.       . clobetasol cream (TEMOVATE) 3.84 % Apply 1 application topically daily.       Marland Kitchen  clonazePAM (KLONOPIN) 0.5 MG tablet Take 0.5 mg by mouth at bedtime.      . cyanocobalamin 1000 MCG tablet Take 1,000 mcg by mouth every morning.       Marland Kitchen esomeprazole (NEXIUM) 40 MG capsule Take 40 mg by mouth daily before breakfast.      . finasteride (PROSCAR) 5 MG tablet Take 5 mg by mouth daily before breakfast.       . gabapentin (NEURONTIN) 300 MG capsule Take 600-900 mg by mouth 2 (two) times daily. He takes two capsules every morning and three capsules at bedtime.      Marland Kitchen lithium carbonate (LITHOBID) 300 MG CR tablet Take 600 mg by mouth at bedtime.       . Multiple Vitamin (MULTIVITAMIN WITH  MINERALS) TABS tablet Take 1 tablet by mouth every morning.      Marland Kitchen OVER THE COUNTER MEDICATION Take 1 tablet by mouth 2 (two) times daily. Glucosamine Chondroitin 1510m/1200mg      . propranolol (INDERAL) 10 MG tablet Take 10-20 mg by mouth daily as needed (For extremely stressful circumstances.).       .Marland Kitchenrosuvastatin (CRESTOR) 10 MG tablet Take 1 tablet (10 mg total) by mouth once a week.  4 tablet  11  . testosterone cypionate (DEPOTESTOTERONE CYPIONATE) 100 MG/ML injection Inject 100 mg into the muscle every Friday.       . vardenafil (LEVITRA) 5 MG tablet Take 5 mg by mouth daily as needed for erectile dysfunction.        No current facility-administered medications for this visit.    SURGICAL HISTORY:  Past Surgical History  Procedure Laterality Date  . Prostate surgery  1996    TURP  . Knee arthroscopy Right 01/2007  . Pilonidal cyst excision  1960's  . Eye surgery      cataracts removed fr. both eyes, IOL in place   . Video assisted thoracoscopy (vats)/wedge resection Right 02/21/2013    Procedure: Right VIDEO ASSISTED THORACOSCOPY ,Thoracotomy with right upper lobectomy, node sampling ;  Surgeon: EGrace Isaac MD;  [facility]  Service: Thoracic;  Laterality: Right;  . Video bronchoscopy N/A 02/21/2013    Procedure: VIDEO BRONCHOSCOPY;  Surgeon: EGrace Isaac MD;  [facility]  Service: Thoracic;  Laterality: N/A;  . Cystoscopy N/A 02/21/2013    Procedure: CYSTOSCOPY FLEXIBLE with insertion of foley catheter;  Surgeon: MFredricka Bonine MD;  [facility]  Service: Urology;  Laterality: N/A;    REVIEW OF SYSTEMS:  Constitutional: negative Eyes: negative Ears, nose, mouth, throat, and face: negative Respiratory: negative Cardiovascular: negative Gastrointestinal: negative Genitourinary:negative Integument/breast: negative Hematologic/lymphatic: negative Musculoskeletal:negative Neurological: negative Behavioral/Psych: negative Endocrine:  negative Allergic/Immunologic: negative   PHYSICAL EXAMINATION: General appearance: alert, cooperative and no distress Head: Normocephalic, without obvious abnormality, atraumatic Neck: no adenopathy Lymph nodes: Cervical, supraclavicular, and axillary nodes normal. Resp: clear to auscultation bilaterally Cardio: regular rate and rhythm, S1, S2 normal, no murmur, click, rub or gallop GI: soft, non-tender; bowel sounds normal; no masses,  no organomegaly Extremities: extremities normal, atraumatic, no cyanosis or edema Neurologic: Alert and oriented X 3, normal strength and tone. Normal symmetric reflexes. Normal coordination and gait  ECOG PERFORMANCE STATUS: 0 - Asymptomatic  Blood pressure 130/78, pulse 81, temperature 98.3 F (36.8 C), temperature source Oral, resp. rate 18, height '5\' 10"'  (1.778 m), weight 186 lb 1.6 oz (84.414 kg).  LABORATORY DATA: Lab Results  Component Value Date   WBC 7.7 07/11/2014   HGB 15.8  07/11/2014   HCT 46.5 07/11/2014   MCV 87.4 07/11/2014   PLT 186 07/11/2014      Chemistry      Component Value Date/Time   NA 141 07/11/2014 1119   NA 135 09/07/2013 1145   K 4.5 07/11/2014 1119   K 5.0 09/07/2013 1145   CL 100 09/07/2013 1145   CL 107 04/24/2013 1110   CO2 25 07/11/2014 1119   CO2 23 09/07/2013 1145   BUN 14.3 07/11/2014 1119   BUN 15 09/07/2013 1145   CREATININE 1.0 07/11/2014 1119   CREATININE 0.80 09/07/2013 1145      Component Value Date/Time   CALCIUM 9.1 07/11/2014 1119   CALCIUM 9.7 09/07/2013 1145   ALKPHOS 59 07/11/2014 1119   ALKPHOS 45 06/19/2014 0900   AST 17 07/11/2014 1119   AST 19 06/19/2014 0900   ALT 17 07/11/2014 1119   ALT 19 06/19/2014 0900   BILITOT 1.24* 07/11/2014 1119   BILITOT 1.2 06/19/2014 0900       RADIOGRAPHIC STUDIES: Ct Chest W Contrast  07/11/2014   CLINICAL DATA:  Restaging lung cancer  EXAM: CT CHEST WITH CONTRAST  TECHNIQUE: Multidetector CT imaging of the chest was performed during intravenous contrast  administration.  CONTRAST:  21m OMNIPAQUE IOHEXOL 300 MG/ML  SOLN  COMPARISON:  04/03/2014  FINDINGS: The heart size appears within normal limits. There is no pericardial effusion identified. The trachea appears patent. The esophagus is on unremarkable. No enlarged mediastinal or hilar lymph nodes identified. There is no axillary or supraclavicular adenopathy.  Small right pleural effusion identified. Radiation change is identified within the paramediastinal right lung. 10 mm nodule in the posterior right lung base is stable from previous exam. No new pulmonary nodules identified.  Review of the visualized osseous structures is negative for aggressive lytic or sclerotic bone lesion. There is mild multi level spondylosis within the thoracic spine.  IMPRESSION: 1. No acute findings. 2. Stable appearance of postsurgical and radiation changes in the right lung. No specific features identified to suggest disease recurrence or metastatic disease. 3. Stable 10 mm nodule in the right lung.   Electronically Signed   By: TKerby MoorsM.D.   On: 07/11/2014 15:25   ASSESSMENT AND PLAN: This is a very pleasant 79years old white male with history of stage IIIa non-small cell lung cancer, adenocarcinoma with positive EGFR mutation status post right upper lobectomy with lymph node dissection followed by 4 cycles of adjuvant chemotherapy. His also status post adjuvant radiotherapy to the mediastinum. His recent CT scan of the chest showed no evidence for disease recurrence. He continues to have stable nodule in the right lung. I discussed the scan results with the patient today and give him a copy of his report. I recommended for him to continue on observation with repeat CT scan of the chest in 6 months. The patient was advised to call immediately if he has any concerning symptoms in the interval.  The patient voices understanding of current disease status and treatment options and is in agreement with the current care  plan.  All questions were answered. The patient knows to call the clinic with any problems, questions or concerns. We can certainly see the patient much sooner if necessary.  Disclaimer: This note was dictated with voice recognition software. Similar sounding words can inadvertently be transcribed and may not be corrected upon review.

## 2014-07-18 ENCOUNTER — Telehealth: Payer: Self-pay | Admitting: Internal Medicine

## 2014-07-18 ENCOUNTER — Encounter: Payer: Self-pay | Admitting: Cardiothoracic Surgery

## 2014-07-18 ENCOUNTER — Ambulatory Visit (INDEPENDENT_AMBULATORY_CARE_PROVIDER_SITE_OTHER): Payer: Self-pay | Admitting: Cardiothoracic Surgery

## 2014-07-18 VITALS — BP 107/67 | HR 86 | Ht 70.0 in | Wt 186.0 lb

## 2014-07-18 DIAGNOSIS — C3410 Malignant neoplasm of upper lobe, unspecified bronchus or lung: Secondary | ICD-10-CM

## 2014-07-18 NOTE — Progress Notes (Signed)
[address]       [place] [address]             [phone]                 Fabyan W Tarleton [facility] Medical Record #[id number] Date of Birth: [birth year]  Jerlyn Ly, MD Jerlyn Ly, MD  Chief Complaint:   PostOp Follow Up Visit  02/21/2013   PREOPERATIVE DIAGNOSIS: Right upper lobe lung mass suspicious for  malignancy.  POSTOPERATIVE DIAGNOSIS: Right upper lobe lung mass suspicious for  malignancy. 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  PROCEDURE PERFORMED: Bronchoscopy, right upper lobe. Bronchoscopy  right video-assisted thoracoscopy, mini thoracotomy and right upper lobe  resection with lymph node dissection and placement of On-Q device.  SURGEON: Lanelle Bal, MD   Stage III A (T2 A., N2, M0) non-small cell lung cancer, adenocarcinoma with positive EGFR mutation in exon 19 and negative ALK gene translocation diagnosed in April 2014   Treatment :  1) Status post right upper lobectomy with lymph node dissection   02/21/2013.  2) Systemic adjuvant chemotherapy with carboplatin for AUC of 5 and Alimta 500 mg/M2 every 3 weeks. Status post 4 cycles. Last cycle was given on 05/29/2013. (Carboplatin was used in place of cisplatin secondary to concern about intolerability and significant adverse effects of cisplatin). Dr Julien Nordmann 3) Adjuvant radiotherapy to the mediastinum under the care of Dr. Lisbeth Renshaw completed 08/27/2013.      History of Present Illness:     Patient returns now 18 months postoperatively. The patient notes significant improvement in his overall symptoms . He is return to normal activities. Does have some mild shortness of breath with exertion. Denies any hemoptysis. Does occasionally have expiratory wheeze and spontaneously clears. Recent CT scan was performed and reviewed with the patient. Currently his greatest concern about his perioperative period was the lack of interest from his 2 sons in  his illness and recovery.  History  Smoking status  . Former Smoker  . Types: Cigarettes  . Quit date: 11/01/1978  Smokeless tobacco  . Not on file       Allergies  Allergen Reactions  . Codeine Anaphylaxis  . Decadron [Dexamethasone]     Pt says it is contraindication due to the lithium he is taking  . Other     Pt. Remarks that all pain med. Make him nauseated   . Statins     Lipitor, Crestor, Zocor all caused muscle aches  . Zetia [Ezetimibe]     Muscle aches  . Zoloft [Sertraline Hcl] Other (See Comments)    REACTION:  unknown    Current Outpatient Prescriptions  Medication Sig Dispense Refill  . aspirin EC 81 MG tablet Take 81 mg by mouth every morning.       . calcipotriene (DOVONOX) 0.005 % cream Apply 1 application topically daily as needed (for hands).       . Calcium Carbonate-Vitamin D (CALTRATE 600+D) 600-400 MG-UNIT per tablet Take 1 tablet by mouth every morning.      . Cholecalciferol (VITAMIN D-3) 1000 UNITS CAPS Take 1,000 Units by mouth every morning.       . clobetasol cream (TEMOVATE) 0.10 % Apply 1 application topically daily.       . clonazePAM (KLONOPIN) 0.5 MG tablet Take 0.5 mg by mouth at bedtime.      . cyanocobalamin 1000 MCG tablet Take 1,000 mcg  by mouth every morning.       Marland Kitchen esomeprazole (NEXIUM) 40 MG capsule Take 40 mg by mouth daily before breakfast.      . finasteride (PROSCAR) 5 MG tablet Take 5 mg by mouth daily before breakfast.       . gabapentin (NEURONTIN) 300 MG capsule Take 600-900 mg by mouth 2 (two) times daily. He takes two capsules every morning and three capsules at bedtime.      Marland Kitchen lithium carbonate (LITHOBID) 300 MG CR tablet Take 600 mg by mouth at bedtime.       . Multiple Vitamin (MULTIVITAMIN WITH MINERALS) TABS tablet Take 1 tablet by mouth every morning.      Marland Kitchen OVER THE COUNTER MEDICATION Take 1 tablet by mouth 2 (two) times daily. Glucosamine Chondroitin 1555m/1200mg      . propranolol (INDERAL) 10 MG tablet Take  10-20 mg by mouth daily as needed (For extremely stressful circumstances.).       .Marland Kitchentestosterone cypionate (DEPOTESTOTERONE CYPIONATE) 100 MG/ML injection Inject 100 mg into the muscle every Friday.       . vardenafil (LEVITRA) 5 MG tablet Take 5 mg by mouth daily as needed for erectile dysfunction.        No current facility-administered medications for this visit.       Physical Exam: BP 107/67  Pulse 86  Ht '5\' 10"'  (1.778 m)  Wt 186 lb (84.369 kg)  BMI 26.69 kg/m2  SpO2 95%  Wt Readings from Last 3 Encounters:  07/18/14 186 lb (84.369 kg)  07/17/14 186 lb 1.6 oz (84.414 kg)  04/11/14 183 lb (83.008 kg)   General appearance: alert, cooperative and no distress Neurologic: intact Heart: regular rate and rhythm, S1, S2 normal, no murmur, click, rub or gallop and normal apical impulse Lungs: clear to auscultation bilaterally and normal percussion bilaterally Abdomen: soft, non-tender; bowel sounds normal; no masses,  no organomegaly Extremities: extremities normal, atraumatic, no cyanosis or edema and Homans sign is negative, no sign of DVT Wound: The right chest wall incisions are all well-healed Patient has no cervical or supraclavicular adenopathy, no carotid bruits no stridor on exam  Diagnostic Studies & Laboratory data:         Recent Radiology Findings: Ct Chest W Contrast  07/11/2014   CLINICAL DATA:  Restaging lung cancer  EXAM: CT CHEST WITH CONTRAST  TECHNIQUE: Multidetector CT imaging of the chest was performed during intravenous contrast administration.  CONTRAST:  834mOMNIPAQUE IOHEXOL 300 MG/ML  SOLN  COMPARISON:  04/03/2014  FINDINGS: The heart size appears within normal limits. There is no pericardial effusion identified. The trachea appears patent. The esophagus is on unremarkable. No enlarged mediastinal or hilar lymph nodes identified. There is no axillary or supraclavicular adenopathy.  Small right pleural effusion identified. Radiation change is identified  within the paramediastinal right lung. 10 mm nodule in the posterior right lung base is stable from previous exam. No new pulmonary nodules identified.  Review of the visualized osseous structures is negative for aggressive lytic or sclerotic bone lesion. There is mild multi level spondylosis within the thoracic spine.  IMPRESSION: 1. No acute findings. 2. Stable appearance of postsurgical and radiation changes in the right lung. No specific features identified to suggest disease recurrence or metastatic disease. 3. Stable 10 mm nodule in the right lung.   Electronically Signed   By: TaKerby Moors.D.   On: 07/11/2014 15:25  Ct Chest W Contrast  04/03/2014   CLINICAL DATA:  Lung cancer. Partial resection. Status post radiation therapy.  EXAM: CT CHEST WITH CONTRAST  TECHNIQUE: Multidetector CT imaging of the chest was performed during intravenous contrast administration.  CONTRAST:  8m OMNIPAQUE IOHEXOL 300 MG/ML  SOLN  COMPARISON:  01/01/2014.  09/07/2013.  FINDINGS: Soft tissue / Mediastinum: There is no axillary lymphadenopathy. No mediastinal or hilar lymphadenopathy. Surgical changes and post radiation fibrosis noted in the region of the right hilum, as before. Heart size is normal. No pericardial effusion.  Lungs / Pleura: Postradiation fibrosis in the medial right lung is stable in appearance. 10 mm nodule in the posterior right lung base is more can flow within on the previous study and probably reflects atelectasis. No pulmonary nodule in the left lung. No focal airspace consolidation or pulmonary edema. Small right pleural effusion.  Bones: Bone windows reveal no worrisome lytic or sclerotic osseous lesions.  Upper Abdomen:  Left upper pole renal cyst is unchanged.  IMPRESSION: Stable appearance of postsurgical and radiation change in the medial right upper lung. No definite disease recurrence.  10 mm nodule in the posterior right lung was not in area of atelectasis on the previous study. This  probably reflects coalescence of the atelectatic changes and attention to this area on followup is recommended.   Electronically Signed   By: EMisty StanleyM.D.   On: 04/03/2014 13:33  Ct Chest W Contrast  01/01/2014   CLINICAL DATA:  Lung cancer restaging  EXAM: CT CHEST WITH CONTRAST  TECHNIQUE: Multidetector CT imaging of the chest was performed during intravenous contrast administration.  CONTRAST:  80 cc of Omni  COMPARISON:  09/07/2013  FINDINGS: The heart size appears normal. No pericardial effusion. There is no mediastinal or hilar adenopathy. No axillary or supraclavicular adenopathy noted.  Small right pleural effusion is identified and is new from previous exam. Postoperative change and volume loss is identified involving the right hemi thorax. There are progressive radiation changes noted within the right lung. No specific features are identified to suggest local tumor recurrence.  No axillary or supraclavicular adenopathy. Related through the upper abdomen is on unremarkable. There are no acute findings identified. The adrenal glands both appear normal. Stable cyst within the upper pole of the left kidney measuring 2.4 cm, image 64/series 2.  Review of the visualized osseous structures is significant for mild scoliosis and multilevel spondylosis involving the thoracic spine.  IMPRESSION: 1. Progressive changes of external beam radiation are identified within the right lung. 2. No evidence for residual or recurrence of tumor.   Electronically Signed   By: TKerby MoorsM.D.   On: 01/01/2014 14:04   CLINICAL DATA: Status post lung surgery, cough shortness of breath,  hemoptysis  EXAM:  CT ANGIOGRAPHY CHEST WITH CONTRAST  TECHNIQUE:  Multidetector CT imaging of the chest was performed using the  standard protocol during bolus administration of intravenous  contrast. Multiplanar CT image reconstructions including MIPs were  obtained to evaluate the vascular anatomy.  CONTRAST: 1047mOMNIPAQUE  IOHEXOL 350 MG/ML SOLN  COMPARISON: 06/27/2013  FINDINGS:  The thoracic inlet is unremarkable. The mediastinum demonstrates  linearly oriented increased soft tissue density within the anterior  superior right hilar region extending posteriorly. Linear areas of  high density are appreciated within these findings. Considering the  patient's history these findings abdominal wall appearance of  postsurgical changes. It appears stable when compared to the  previous study. Residual or recurrent disease cannot be excluded.  The There is no evidence of filling defects within the  main, lobar,  or segmental pulmonary arteries.  The lung parenchyma demonstrates diffuse airspace disease in the  posterior aspect of the right upper lobe, superior segment right  lower lobe. Mild increased density projects in the the base of the  left lower lobe.  Visualized upper abdominal viscera grossly unremarkable. Multilevel  degenerative changes are appreciated within the thoracic spine.  Review of the MIP images confirms the above findings.  IMPRESSION:  1. No CT evidence of pulmonary arterial embolic disease  2. Diffuse areas of pneumonitis within the superior segment right  lower lobe and to a lesser extent at the posterior aspect of the  right upper lobe differential considerations are postsurgical  changes versus an infectious or inflammatory pneumonitis. Pulmonary  hemorrhage considering the patient's history is a diagnostic  consideration. Asymmetric edema is of much lower consideration  considering the findings in the remaining portions of the lung.  Electronically Signed  By: Margaree Mackintosh M.D.  On: 09/07/2013 14:26   Ct Chest W Contrast  06/27/2013   *RADIOLOGY REPORT*  Clinical Data: Staging lung cancer  CT CHEST WITH CONTRAST  Technique:  Multidetector CT imaging of the chest was performed following the standard protocol during bolus administration of intravenous contrast.  Contrast: 57m  OMNIPAQUE IOHEXOL 300 MG/ML  SOLN  Comparison: 02/14/2013  Findings: No pleural effusion identified.  The patient is status post right upper lobectomy. There is a new sub solid density within the right lung base measuring 1.1 x 1.8 cm, image 41/series 6. This is new from the previous exam.  Within the left lower lobe there is a 5 mm nodule, image 46/series 5.  Heart size appears normal.  There is no mediastinal or hilar adenopathy identified.  There is no enlarged axillary or supraclavicular adenopathy.  Limited imaging through the upper abdomen shows bilateral renal cysts.  No acute findings identified.  No mass or adenopathy.  Review of the visualized bony structures is significant for mild scoliosis and multilevel spondylosis.  IMPRESSION:  1.  No acute cardiopulmonary abnormalities. 2.  Postoperative changes from right upper lobectomy. 3.  New sub solid density within the right lower lobe is nonspecific and may be treatment related.  Recommend follow-up imaging in 3 months to confirm persistence.   Original Report Authenticated By: TKerby Moors M.D.    Recent Labs: Lab Results  Component Value Date   WBC 7.7 07/11/2014   HGB 15.8 07/11/2014   HCT 46.5 07/11/2014   PLT 186 07/11/2014   GLUCOSE 95 07/11/2014   CHOL 206* 06/19/2014   TRIG 216.0* 06/19/2014   HDL 38.50* 06/19/2014   LDLDIRECT 144.0 06/19/2014   ALT 17 07/11/2014   AST 17 07/11/2014   NA 141 07/11/2014   K 4.5 07/11/2014   CL 100 09/07/2013   CREATININE 1.0 07/11/2014   BUN 14.3 07/11/2014   CO2 25 07/11/2014   INR 0.99 02/20/2013   PATH: stage IIIA,  EGFR: Exon 19 deletion  Mutation detected Diagnosis 1. Lung, resection (segmental or lobe), Right upper lobe - ADENOCARCINOMA, 3.4 CM. - MARGINS NOT INVOLVED. - VISCERAL PLEURA FREE OF TUMOR. - ONE BENIGN LYMPH NODE. 2. Lymph node, biopsy, Right upper 4 R - METASTATIC ADENOCARCINOMA. 3. Lymph node, biopsy, Right 2 R - ANTHRACOTIC LYMPH NODE. NO TUMOR IDENTIFIED. Microscopic  Comment 1. LUNG Specimen, including laterality: Right lung lobe. Procedure: Lobectomy. Specimen integrity (intact/disrupted): Intact. Tumor site: Right upper lobe. Tumor focality: Unifocal Maximum tumor size (cm): 3.4 cm. Histologic type: Adenocarcinoma, bronchioalveolar cell type. Grade:  I. Margins: Free of tumor. Visceral pleura invasion: No. Tumor extension: Within lobe. Treatment effect (if treated with neoadjuvant therapy): No. Lymph -Vascular invasion: Present. Lymph nodes: Number examined - 3 ; Number N1 nodes positive - 0 ; Number N2 nodes positive - 1 TNM code: pT2a , pN2, pMX Ancillary studies: EGFR and ALK pending. Non-neoplastic lung: Unremarkable     Assessment / Plan:    Stable following resection of right upper lobe, lung cancer which was found to be EGFR positive adenocarcinoma stage IIIa. With postoperative radiation changes present on recent CT scan done on September 10 no evidence of recurrent disease, there is an area of atelectasis or pseudotumor along the fissure in the right upper lobe but will be followed on subsequent CT scans.  there is a stable right lower lobe lung nodule that is being followed by CT also . Plan see the patient back in 6 months after his next CT scan    Aliani Caccavale B 07/18/2014 2:47 PM

## 2014-07-18 NOTE — Telephone Encounter (Signed)
lvm for pt regarding to March 2016....mailed pt aptp sched/avs and letter

## 2014-08-01 ENCOUNTER — Other Ambulatory Visit: Payer: Self-pay | Admitting: Dermatology

## 2014-10-15 ENCOUNTER — Other Ambulatory Visit: Payer: Federal, State, Local not specified - PPO

## 2014-10-19 ENCOUNTER — Telehealth: Payer: Self-pay | Admitting: Family

## 2014-10-19 DIAGNOSIS — R059 Cough, unspecified: Secondary | ICD-10-CM

## 2014-10-19 DIAGNOSIS — R05 Cough: Secondary | ICD-10-CM

## 2014-10-19 NOTE — Progress Notes (Signed)
We are sorry that you are not feeling well.  Here is how we plan to help!  Your cough may represent a more serious condition. You will need a face-to-face visit for complicated/severe symptoms which could represent a more serious condition.  Please follow-up with Dr. Earlie Server with any questions about your plan of care.  If you are having a true medical emergency please call 911.  If you need an urgent face to face visit, [facility] has four urgent care centers for your convenience.  . [facility] a Provider at this Location  [address] [place], [place] [address] . 8 am to 8 pm Monday-Friday . 9 am to 7 pm Saturday-Sunday  . [facility] [place] a Provider at this Location  [place], [place], [place] [address] . 8 am to 8 pm Monday-Friday . 9 am to 6 pm Saturday . 11 am to 6 pm Sunday   . [facility] [place] Get Driving Directions  [address] Suite [place], [place] [address] . 8 am to 8 pm Monday-Friday . 9 am to 4 pm Saturday-Sunday   . [facility] (a walk in primary care provider)  [facility] a Provider at this Location  [place], [place] [address] . 8 am to 8:30 pm Monday-Thursday . 8 am to 6 pm Friday . 8 am to 4 pm Saturday-Sunday  Your e-visit answers were reviewed by a board certified advanced clinical practitioner to complete your personal care plan.  Depending on the condition, your plan could have included both over the counter or prescription medications.  You will get an e-mail in the next two days asking about your experience.  I hope that your e-visit has been valuable and will speed your recovery . Thank you for choosing an e-visit.

## 2014-11-03 ENCOUNTER — Encounter: Payer: Self-pay | Admitting: Cardiovascular Disease

## 2014-11-13 NOTE — Telephone Encounter (Signed)
Follow Up  Pt would like to speak w/ Rn about some Chest tightness. Pt was given next avail appt for Dr. Gwenlyn Found 2/16. Please call back and discuss.

## 2014-12-17 ENCOUNTER — Ambulatory Visit: Payer: Federal, State, Local not specified - PPO | Admitting: Cardiovascular Disease

## 2015-01-14 ENCOUNTER — Ambulatory Visit (HOSPITAL_COMMUNITY)
Admission: RE | Admit: 2015-01-14 | Discharge: 2015-01-14 | Disposition: A | Discharge status: Home / Self Care | Payer: Medicare Other | Source: Ambulatory Visit | Attending: Internal Medicine | Admitting: Internal Medicine

## 2015-01-14 ENCOUNTER — Encounter (HOSPITAL_COMMUNITY): Payer: Self-pay

## 2015-01-14 ENCOUNTER — Other Ambulatory Visit (HOSPITAL_BASED_OUTPATIENT_CLINIC_OR_DEPARTMENT_OTHER): Payer: Federal, State, Local not specified - PPO

## 2015-01-14 DIAGNOSIS — C3411 Malignant neoplasm of upper lobe, right bronchus or lung: Secondary | ICD-10-CM | POA: Diagnosis present

## 2015-01-14 DIAGNOSIS — Z923 Personal history of irradiation: Secondary | ICD-10-CM | POA: Insufficient documentation

## 2015-01-14 DIAGNOSIS — Z9221 Personal history of antineoplastic chemotherapy: Secondary | ICD-10-CM | POA: Diagnosis not present

## 2015-01-14 LAB — COMPREHENSIVE METABOLIC PANEL (CC13)
ALK PHOS: 50 U/L (ref 40–150)
ALT: 18 U/L (ref 0–55)
ANION GAP: 9 meq/L (ref 3–11)
AST: 17 U/L (ref 5–34)
Albumin: 4 g/dL (ref 3.5–5.0)
BILIRUBIN TOTAL: 1.18 mg/dL (ref 0.20–1.20)
BUN: 14.3 mg/dL (ref 7.0–26.0)
CO2: 24 mEq/L (ref 22–29)
Calcium: 9.1 mg/dL (ref 8.4–10.4)
Chloride: 108 mEq/L (ref 98–109)
Creatinine: 1 mg/dL (ref 0.7–1.3)
EGFR: 76 mL/min/{1.73_m2} — AB (ref >90)
GLUCOSE: 97 mg/dL (ref 70–140)
Potassium: 4.4 mEq/L (ref 3.5–5.1)
SODIUM: 141 meq/L (ref 136–145)
Total Protein: 6.5 g/dL (ref 6.4–8.3)

## 2015-01-14 LAB — CBC WITH DIFFERENTIAL/PLATELET
BASO%: 0.8 % (ref 0.0–2.0)
BASOS ABS: 0.1 10*3/uL (ref 0.0–0.1)
EOS%: 2.6 % (ref 0.0–7.0)
Eosinophils Absolute: 0.2 10*3/uL (ref 0.0–0.5)
HEMATOCRIT: 47.1 % (ref 38.4–49.9)
HEMOGLOBIN: 15.9 g/dL (ref 13.0–17.1)
LYMPH#: 1.8 10*3/uL (ref 0.9–3.3)
LYMPH%: 23.6 % (ref 14.0–49.0)
MCH: 29.7 pg (ref 27.2–33.4)
MCHC: 33.8 g/dL (ref 32.0–36.0)
MCV: 88 fL (ref 79.3–98.0)
MONO#: 0.6 10*3/uL (ref 0.1–0.9)
MONO%: 8.3 % (ref 0.0–14.0)
NEUT#: 5 10*3/uL (ref 1.5–6.5)
NEUT%: 64.7 % (ref 39.0–75.0)
Platelets: 180 10*3/uL (ref 140–400)
RBC: 5.35 10*6/uL (ref 4.20–5.82)
RDW: 13.5 % (ref 11.0–14.6)
WBC: 7.7 10*3/uL (ref 4.0–10.3)

## 2015-01-14 MED ORDER — IOHEXOL 300 MG/ML  SOLN
80.0000 mL | Freq: Once | INTRAMUSCULAR | Status: AC | PRN
  Administered 2015-01-14: 80 mL via INTRAVENOUS

## 2015-01-21 ENCOUNTER — Ambulatory Visit (HOSPITAL_BASED_OUTPATIENT_CLINIC_OR_DEPARTMENT_OTHER): Payer: Federal, State, Local not specified - PPO | Admitting: Internal Medicine

## 2015-01-21 ENCOUNTER — Telehealth: Payer: Self-pay | Admitting: Internal Medicine

## 2015-01-21 ENCOUNTER — Encounter: Payer: Self-pay | Admitting: Internal Medicine

## 2015-01-21 VITALS — BP 128/83 | HR 88 | Temp 98.6°F | Resp 18 | Ht 70.0 in | Wt 188.7 lb

## 2015-01-21 DIAGNOSIS — C3411 Malignant neoplasm of upper lobe, right bronchus or lung: Secondary | ICD-10-CM

## 2015-01-21 NOTE — Telephone Encounter (Signed)
Gave avs & calendar for September °

## 2015-01-21 NOTE — Progress Notes (Signed)
Alvin [phone]   [phone]  OFFICE PROGRESS NOTE  Alvin Ly, MD [place] [address]  DIAGNOSIS AND STAGE: Stage III A (T2 A., N2, M0) non-small cell lung cancer, adenocarcinoma with positive EGFR mutation in exon 19 and negative ALK gene translocation diagnosed in April 2014   PRIOR THERAPY:  1) Status post right upper lobectomy with lymph node dissection under the care of Dr. Servando Hardy on 02/21/2013. 2) Systemic adjuvant chemotherapy with carboplatin for AUC of 5 and Alimta 500 mg/M2 every 3 weeks. Status post 4 cycles. Last cycle was given on 05/29/2013. (Carboplatin was used in place of cisplatin secondary to concern about intolerability and significant adverse effects of cisplatin).  3) Adjuvant radiotherapy to the mediastinum under the care of Dr. Lisbeth Hardy completed 08/27/2013.   CURRENT THERAPY: Observation  CHEMOTHERAPY INTENT: adjuvant/curative  CURRENT # OF CHEMOTHERAPY CYCLES: 0 CURRENT ANTIEMETICS: Zofran, Decadron and Compazine  CURRENT SMOKING STATUS: currently a nonsmoker  ORAL CHEMOTHERAPY AND CONSENT: None  CURRENT BISPHOSPHONATES USE: None  PAIN MANAGEMENT: no pain  NARCOTICS INDUCED CONSTIPATION: N/A  LIVING WILL AND CODE STATUS: Full Code.   INTERVAL HISTORY: Alvin Hardy 80 y.o. male returns to the clinic today for 6 months followup visit followup visit. Alvin Hardy has no complaints today. He denied having any significant fever or chills. He has no nausea or vomiting. He denied having any significant chest pain, shortness of breath, cough or hemoptysis. He had repeat CT scan of the chest performed recently and he is here for evaluation and discussion of his scan results.  MEDICAL HISTORY: Past Medical History  Diagnosis Date  . BPH (benign prostatic hyperplasia)   . Hypogonadism male   . Bipolar 1 disorder   . ED (erectile dysfunction)   . Hyperlipidemia   . Neuromuscular disorder    peripheral neuropathy  . Cataract 2010    Bilateral  . Laryngopharyngeal reflux   . Gilbert's syndrome   . Deviated septum     TO THE LEFT  . Morton's neuroma     LEFT FOOT  . Lung mass 02/07/13    RIGHT UPPER LOBE  . Shortness of breath     voice breaks, ? related to reflux, although told by Alvin Hardy, S.- early 89 yr. old  . GERD (gastroesophageal reflux disease)   . Arthritis     psoriatic arthritis, ? , treated /w mmethotrexate   . Finger injury     mallet finger-- 02/13/2013, cast in place, followed by dr. Fredna Hardy  . Allergy   . Depression   . History of radiation therapy 07/24/13-08/27/13    50Gy/25 fx chest  . lung ca dx'd 12/2012    rul  . Chest pain     ALLERGIES:  is allergic to codeine; decadron; other; statins; zetia; and zoloft.  MEDICATIONS:  Current Outpatient Prescriptions  Medication Sig Dispense Refill  . aspirin EC 81 MG tablet Take 81 mg by mouth every morning.     . calcipotriene (DOVONOX) 0.005 % cream Apply 1 application topically daily as needed (for hands).     . Calcium Carbonate-Vitamin D (CALTRATE 600+D) 600-400 MG-UNIT per tablet Take 1 tablet by mouth every morning.    . Cholecalciferol (VITAMIN D-3) 1000 UNITS CAPS Take 1,000 Units by mouth every morning.     . clobetasol cream (TEMOVATE) 6.62 % Apply 1 application topically daily.     . clonazePAM (KLONOPIN) 0.5 MG tablet Take 0.5 mg  by mouth at bedtime.    . cyanocobalamin 1000 MCG tablet Take 1,000 mcg by mouth every morning.     Marland Kitchen ELIDEL 1 % cream Apply as directed  3  . esomeprazole (NEXIUM) 40 MG capsule Take 40 mg by mouth daily before breakfast.    . finasteride (PROSCAR) 5 MG tablet Take 5 mg by mouth daily before breakfast.     . gabapentin (NEURONTIN) 300 MG capsule Take 600-900 mg by mouth 2 (two) times daily. He takes two capsules every morning and three capsules at bedtime.    Marland Kitchen lithium carbonate (LITHOBID) 300 MG CR tablet Take 600 mg by mouth at bedtime.     . Multiple Vitamin  (MULTIVITAMIN WITH MINERALS) TABS tablet Take 1 tablet by mouth every morning.    Marland Kitchen OVER THE COUNTER MEDICATION Take 1 tablet by mouth 2 (two) times daily. Glucosamine Chondroitin 1554m/1200mg    . propranolol (INDERAL) 10 MG tablet Take 10-20 mg by mouth daily as needed (For extremely stressful circumstances.).     .Marland Kitchentamsulosin (FLOMAX) 0.4 MG CAPS capsule Take 0.4 mg by mouth daily.  3  . testosterone cypionate (DEPOTESTOTERONE CYPIONATE) 100 MG/ML injection Inject 100 mg into the muscle every Friday.     . testosterone cypionate (DEPOTESTOTERONE CYPIONATE) 200 MG/ML injection Inject 0.5 mLs into the muscle once a week.  1  . vardenafil (LEVITRA) 5 MG tablet Take 5 mg by mouth daily as needed for erectile dysfunction.      No current facility-administered medications for this visit.    SURGICAL HISTORY:  Past Surgical History  Procedure Laterality Date  . Prostate surgery  1996    TURP  . Knee arthroscopy Right 01/2007  . Pilonidal cyst excision  1960's  . Eye surgery      cataracts removed fr. both eyes, IOL in place   . Video assisted thoracoscopy (vats)/wedge resection Right 02/21/2013    Procedure: Right VIDEO ASSISTED THORACOSCOPY ,Thoracotomy with right upper lobectomy, node sampling ;  Surgeon: Alvin Isaac MD;  [facility]  Service: Thoracic;  Laterality: Right;  . Video bronchoscopy N/A 02/21/2013    Procedure: VIDEO BRONCHOSCOPY;  Surgeon: Alvin Isaac MD;  [facility] [facility]  Service: Thoracic;  Laterality: N/A;  . Cystoscopy N/A 02/21/2013    Procedure: CYSTOSCOPY FLEXIBLE with insertion of foley catheter;  Surgeon: MFredricka Bonine MD;  [facility]  Service: Urology;  Laterality: N/A;    REVIEW OF SYSTEMS:  Constitutional: negative Eyes: negative Ears, nose, mouth, throat, and face: negative Respiratory: negative Cardiovascular: negative Gastrointestinal: negative Genitourinary:negative Integument/breast: negative Hematologic/lymphatic:  negative Musculoskeletal:negative Neurological: negative Behavioral/Psych: negative Endocrine: negative Allergic/Immunologic: negative   PHYSICAL EXAMINATION: General appearance: alert, cooperative and no distress Head: Normocephalic, without obvious abnormality, atraumatic Neck: no adenopathy Lymph nodes: Cervical, supraclavicular, and axillary nodes normal. Resp: clear to auscultation bilaterally Cardio: regular rate and rhythm, S1, S2 normal, no murmur, click, rub or gallop GI: soft, non-tender; bowel sounds normal; no masses,  no organomegaly Extremities: extremities normal, atraumatic, no cyanosis or edema Neurologic: Alert and oriented X 3, normal strength and tone. Normal symmetric reflexes. Normal coordination and gait  ECOG PERFORMANCE STATUS: 0 - Asymptomatic  Blood pressure 128/83, pulse 88, temperature 98.6 F (37 C), temperature source Oral, resp. rate 18, height '5\' 10"'  (1.778 m), weight 188 lb 11.2 oz (85.594 kg), SpO2 99 %.  LABORATORY DATA: Lab Results  Component Value Date   WBC 7.7 01/14/2015   HGB 15.9 01/14/2015  HCT 47.1 01/14/2015   MCV 88.0 01/14/2015   PLT 180 01/14/2015      Chemistry      Component Value Date/Time   NA 141 01/14/2015 1143   NA 135 09/07/2013 1145   K 4.4 01/14/2015 1143   K 5.0 09/07/2013 1145   CL 100 09/07/2013 1145   CL 107 04/24/2013 1110   CO2 24 01/14/2015 1143   CO2 23 09/07/2013 1145   BUN 14.3 01/14/2015 1143   BUN 15 09/07/2013 1145   CREATININE 1.0 01/14/2015 1143   CREATININE 0.80 09/07/2013 1145      Component Value Date/Time   CALCIUM 9.1 01/14/2015 1143   CALCIUM 9.7 09/07/2013 1145   ALKPHOS 50 01/14/2015 1143   ALKPHOS 45 06/19/2014 0900   AST 17 01/14/2015 1143   AST 19 06/19/2014 0900   ALT 18 01/14/2015 1143   ALT 19 06/19/2014 0900   BILITOT 1.18 01/14/2015 1143   BILITOT 1.2 06/19/2014 0900       RADIOGRAPHIC STUDIES: Ct Chest W Contrast  01/14/2015   CLINICAL DATA:  80 year old male  with history of right upper lobe lung cancer diagnosed in March 2014 status right upper lobectomy and status post chemotherapy and radiation therapy, completed in October 2014.  EXAM: CT CHEST WITH CONTRAST  TECHNIQUE: Multidetector CT imaging of the chest was performed during intravenous contrast administration.  CONTRAST:  48m OMNIPAQUE IOHEXOL 300 MG/ML  SOLN  COMPARISON:  Chest CT 07/11/2014.  FINDINGS: Mediastinum/Lymph Nodes: Heart size is normal. There is no significant pericardial fluid, thickening or pericardial calcification. No pathologically enlarged mediastinal or hilar lymph nodes. Esophagus is unremarkable in appearance. No axillary lymphadenopathy.  Lungs/Pleura: Status post right upper lobectomy. Chronic postradiation changes are noted in the perihilar aspect of the remaining right middle and lower lobes, similar to prior examinations. There continues to be an ovoid shaped 10 x 5 mm nodule in the posterior aspect of the right lower lobe, which is associated with the inferior pulmonary ligament, and is similar in appearance to prior examinations dating back to 04/03/2014, favored to be a reactive subpleural lymph node. No other suspicious appearing pulmonary nodules or masses are otherwise noted. No acute consolidative airspace disease. No pleural effusions.  Upper Abdomen: 2.1 cm exophytic intermediate attenuation (22 HU) lesion in the upper pole of the left kidney is similar in size to numerous prior examinations dating back to 01/25/2013, presumably a proteinaceous cyst.  Musculoskeletal/Soft Tissues: There are no aggressive appearing lytic or blastic lesions noted in the visualized portions of the skeleton.  IMPRESSION: 1. Today's examination demonstrates stable post treatment related changes, as above, without definite findings to suggest recurrent disease in the thorax. 2. Specifically, and 10 x 5 mm ovoid shaped subpleural nodule in the posterior aspect of the right lower lobe is unchanged,  favored to represent a subpleural lymph node.   Electronically Signed   By: DVinnie LangtonM.D.   On: 01/14/2015 16:00   ASSESSMENT AND PLAN: This is a very pleasant 80years old white male with history of stage IIIa non-small cell lung cancer, adenocarcinoma with positive EGFR mutation status post right upper lobectomy with lymph node dissection followed by 4 cycles of adjuvant chemotherapy. His also status post adjuvant radiotherapy to the mediastinum. The patient has been on observation since October 2014 with no significant evidence for disease recurrence. The suspicious subpleural nodule in the posterior aspect of the right lower lobe is unchanged and favored to be a subpleural lymph node. I  discussed the scan results with the patient today and give him a copy of his report. I recommended for him to continue on observation with repeat CT scan of the chest in 6 months. The patient was advised to call immediately if he has any concerning symptoms in the interval.  The patient voices understanding of current disease status and treatment options and is in agreement with the current care plan.  All questions were answered. The patient knows to call the clinic with any problems, questions or concerns. We can certainly see the patient much sooner if necessary.  Disclaimer: This note was dictated with voice recognition software. Similar sounding words can inadvertently be transcribed and may not be corrected upon review.

## 2015-01-23 ENCOUNTER — Ambulatory Visit (INDEPENDENT_AMBULATORY_CARE_PROVIDER_SITE_OTHER): Payer: Federal, State, Local not specified - PPO | Admitting: Cardiothoracic Surgery

## 2015-01-23 ENCOUNTER — Encounter: Payer: Self-pay | Admitting: Cardiothoracic Surgery

## 2015-01-23 VITALS — BP 132/80 | HR 90 | Resp 20 | Ht 70.0 in | Wt 188.0 lb

## 2015-01-23 DIAGNOSIS — Z85118 Personal history of other malignant neoplasm of bronchus and lung: Secondary | ICD-10-CM | POA: Diagnosis not present

## 2015-01-23 DIAGNOSIS — R911 Solitary pulmonary nodule: Secondary | ICD-10-CM | POA: Diagnosis not present

## 2015-01-23 NOTE — Progress Notes (Signed)
[address]       [place] [address]             [phone]                 Humza W Whitener [place] Medical Record #[id number] Date of Birth: [birth year]  Curt Bears, MD Jerlyn Ly, MD  Chief Complaint:   PostOp Follow Up Visit  02/21/2013   PREOPERATIVE DIAGNOSIS: Right upper lobe lung mass suspicious for  malignancy.  POSTOPERATIVE DIAGNOSIS: Right upper lobe lung mass suspicious for  malignancy. 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  PROCEDURE PERFORMED: Bronchoscopy, right upper lobe. Bronchoscopy  right video-assisted thoracoscopy, mini thoracotomy and right upper lobe  resection with lymph node dissection and placement of On-Q device.  SURGEON: Lanelle Bal, MD   Stage III A (T2 A., N2, M0) non-small cell lung cancer, adenocarcinoma with positive EGFR mutation in exon 19 and negative ALK gene translocation diagnosed in April 2014   Treatment :  1) Status post right upper lobectomy with lymph node dissection   02/21/2013.  2) Systemic adjuvant chemotherapy with carboplatin for AUC of 5 and Alimta 500 mg/M2 every 3 weeks. Status post 4 cycles. Last cycle was given on 05/29/2013. (Carboplatin was used in place of cisplatin secondary to concern about intolerability and significant adverse effects of cisplatin). Dr Julien Nordmann 3) Adjuvant radiotherapy to the mediastinum under the care of Dr. Lisbeth Renshaw completed 08/27/2013.      History of Present Illness:     Patient returns now 18 months postoperatively. The patient notes significant improvement in his overall symptoms . He is return to normal activities. Does have some mild shortness of breath with exertion. Denies any hemoptysis. Does occasionally have expiratory wheeze and spontaneously clears. Recent CT scan was performed and reviewed with the patient. Currently his greatest concern about his perioperative period was the lack of interest from his 2 sons in  his illness and recovery.  History  Smoking status  . Former Smoker  . Types: Cigarettes  . Quit date: 11/01/1978  Smokeless tobacco  . Not on file       Allergies  Allergen Reactions  . Codeine Anaphylaxis  . Decadron [Dexamethasone]     Pt says it is contraindication due to the lithium he is taking  . Other     Pt. Remarks that all pain med. Make him nauseated   . Statins     Lipitor, Crestor, Zocor all caused muscle aches  . Zetia [Ezetimibe]     Muscle aches  . Zoloft [Sertraline Hcl] Other (See Comments)    REACTION:  unknown    Current Outpatient Prescriptions  Medication Sig Dispense Refill  . aspirin EC 81 MG tablet Take 81 mg by mouth every morning.     . calcipotriene (DOVONOX) 0.005 % cream Apply 1 application topically daily as needed (for hands).     . Calcium Carbonate-Vitamin D (CALTRATE 600+D) 600-400 MG-UNIT per tablet Take 1 tablet by mouth every morning.    . Cholecalciferol (VITAMIN D-3) 1000 UNITS CAPS Take 1,000 Units by mouth every morning.     . clobetasol cream (TEMOVATE) 3.21 % Apply 1 application topically daily.     . clonazePAM (KLONOPIN) 0.5 MG tablet Take 0.5 mg by mouth at bedtime.    . cyanocobalamin 1000 MCG tablet Take 1,000 mcg by mouth every morning.     Marland Kitchen esomeprazole (NEXIUM) 40 MG  capsule Take 40 mg by mouth daily before breakfast.    . Evolocumab (REPATHA SURECLICK [place]) Inject 220 mg into the skin every 21 ( twenty-one) days. Injects every 2 weeks/ study program with [employer]    . finasteride (PROSCAR) 5 MG tablet Take 5 mg by mouth daily before breakfast.     . gabapentin (NEURONTIN) 300 MG capsule Take 600-900 mg by mouth 2 (two) times daily. He takes two capsules every morning and three capsules at bedtime.    Marland Kitchen lithium carbonate (LITHOBID) 300 MG CR tablet Take 600 mg by mouth at bedtime.     . Multiple Vitamin (MULTIVITAMIN WITH MINERALS) TABS tablet Take 1 tablet by mouth every morning.    Marland Kitchen OVER THE COUNTER MEDICATION Take 1  tablet by mouth 2 (two) times daily. Glucosamine Chondroitin 159m/1200mg    . propranolol (INDERAL) 10 MG tablet Take 10-20 mg by mouth daily as needed (For extremely stressful circumstances.).     .Marland Kitchentamsulosin (FLOMAX) 0.4 MG CAPS capsule Take 0.4 mg by mouth daily.  3  . testosterone cypionate (DEPOTESTOTERONE CYPIONATE) 200 MG/ML injection Inject 0.5 mLs into the muscle once a week.  1  . vardenafil (LEVITRA) 5 MG tablet Take 5 mg by mouth daily as needed for erectile dysfunction.      No current facility-administered medications for this visit.       Physical Exam: BP 132/80 mmHg  Pulse 90  Resp 20  Ht _0  (1.778 m)  Wt 188 lb (85.276 kg)  BMI 26.98 kg/m2  SpO2 97%  Wt Readings from Last 3 Encounters:  01/23/15 188 lb (85.276 kg)  01/21/15 188 lb 11.2 oz (85.594 kg)  07/18/14 186 lb (84.369 kg)   General appearance: alert, cooperative and no distress Neurologic: intact Heart: regular rate and rhythm, S1, S2 normal, no murmur, click, rub or gallop and normal apical impulse Lungs: clear to auscultation bilaterally and normal percussion bilaterally Abdomen: soft, non-tender; bowel sounds normal; no masses,  no organomegaly Extremities: extremities normal, atraumatic, no cyanosis or edema and Homans sign is negative, no sign of DVT Wound: The right chest wall incisions are all well-healed Patient has no cervical or supraclavicular adenopathy, no carotid bruits no stridor on exam  Diagnostic Studies & Laboratory data:         Recent Radiology Findings: Ct Chest W Contrast  01/14/2015   CLINICAL DATA:  80year old male with history of right upper lobe lung cancer diagnosed in March 2014 status right upper lobectomy and status post chemotherapy and radiation therapy, completed in October 2014.  EXAM: CT CHEST WITH CONTRAST  TECHNIQUE: Multidetector CT imaging of the chest was performed during intravenous contrast administration.  CONTRAST:  861mOMNIPAQUE IOHEXOL 300 MG/ML   SOLN  COMPARISON:  Chest CT 07/11/2014.  FINDINGS: Mediastinum/Lymph Nodes: Heart size is normal. There is no significant pericardial fluid, thickening or pericardial calcification. No pathologically enlarged mediastinal or hilar lymph nodes. Esophagus is unremarkable in appearance. No axillary lymphadenopathy.  Lungs/Pleura: Status post right upper lobectomy. Chronic postradiation changes are noted in the perihilar aspect of the remaining right middle and lower lobes, similar to prior examinations. There continues to be an ovoid shaped 10 x 5 mm nodule in the posterior aspect of the right lower lobe, which is associated with the inferior pulmonary ligament, and is similar in appearance to prior examinations dating back to 04/03/2014, favored to be a reactive subpleural lymph node. No other suspicious appearing pulmonary nodules or masses are otherwise noted. No  acute consolidative airspace disease. No pleural effusions.  Upper Abdomen: 2.1 cm exophytic intermediate attenuation (22 HU) lesion in the upper pole of the left kidney is similar in size to numerous prior examinations dating back to 01/25/2013, presumably a proteinaceous cyst.  Musculoskeletal/Soft Tissues: There are no aggressive appearing lytic or blastic lesions noted in the visualized portions of the skeleton.  IMPRESSION: 1. Today's examination demonstrates stable post treatment related changes, as above, without definite findings to suggest recurrent disease in the thorax. 2. Specifically, and 10 x 5 mm ovoid shaped subpleural nodule in the posterior aspect of the right lower lobe is unchanged, favored to represent a subpleural lymph node.   Electronically Signed   By: Vinnie Langton M.D.   On: 01/14/2015 16:00   I have independently reviewed the above radiology studies  and reviewed the findings with the patient.    Ct Chest W Contrast  07/11/2014   CLINICAL DATA:  Restaging lung cancer  EXAM: CT CHEST WITH CONTRAST  TECHNIQUE:  Multidetector CT imaging of the chest was performed during intravenous contrast administration.  CONTRAST:  38m OMNIPAQUE IOHEXOL 300 MG/ML  SOLN  COMPARISON:  04/03/2014  FINDINGS: The heart size appears within normal limits. There is no pericardial effusion identified. The trachea appears patent. The esophagus is on unremarkable. No enlarged mediastinal or hilar lymph nodes identified. There is no axillary or supraclavicular adenopathy.  Small right pleural effusion identified. Radiation change is identified within the paramediastinal right lung. 10 mm nodule in the posterior right lung base is stable from previous exam. No new pulmonary nodules identified.  Review of the visualized osseous structures is negative for aggressive lytic or sclerotic bone lesion. There is mild multi level spondylosis within the thoracic spine.  IMPRESSION: 1. No acute findings. 2. Stable appearance of postsurgical and radiation changes in the right lung. No specific features identified to suggest disease recurrence or metastatic disease. 3. Stable 10 mm nodule in the right lung.   Electronically Signed   By: TKerby MoorsM.D.   On: 07/11/2014 15:25  Ct Chest W Contrast  04/03/2014   CLINICAL DATA:  Lung cancer. Partial resection. Status post radiation therapy.  EXAM: CT CHEST WITH CONTRAST  TECHNIQUE: Multidetector CT imaging of the chest was performed during intravenous contrast administration.  CONTRAST:  868mOMNIPAQUE IOHEXOL 300 MG/ML  SOLN  COMPARISON:  01/01/2014.  09/07/2013.  FINDINGS: Soft tissue / Mediastinum: There is no axillary lymphadenopathy. No mediastinal or hilar lymphadenopathy. Surgical changes and post radiation fibrosis noted in the region of the right hilum, as before. Heart size is normal. No pericardial effusion.  Lungs / Pleura: Postradiation fibrosis in the medial right lung is stable in appearance. 10 mm nodule in the posterior right lung base is more can flow within on the previous study and probably  reflects atelectasis. No pulmonary nodule in the left lung. No focal airspace consolidation or pulmonary edema. Small right pleural effusion.  Bones: Bone windows reveal no worrisome lytic or sclerotic osseous lesions.  Upper Abdomen:  Left upper pole renal cyst is unchanged.  IMPRESSION: Stable appearance of postsurgical and radiation change in the medial right upper lung. No definite disease recurrence.  10 mm nodule in the posterior right lung was not in area of atelectasis on the previous study. This probably reflects coalescence of the atelectatic changes and attention to this area on followup is recommended.   Electronically Signed   By: ErMisty Stanley.D.   On: 04/03/2014 13:33  Ct  Chest W Contrast  01/01/2014   CLINICAL DATA:  Lung cancer restaging  EXAM: CT CHEST WITH CONTRAST  TECHNIQUE: Multidetector CT imaging of the chest was performed during intravenous contrast administration.  CONTRAST:  80 cc of Omni  COMPARISON:  09/07/2013  FINDINGS: The heart size appears normal. No pericardial effusion. There is no mediastinal or hilar adenopathy. No axillary or supraclavicular adenopathy noted.  Small right pleural effusion is identified and is new from previous exam. Postoperative change and volume loss is identified involving the right hemi thorax. There are progressive radiation changes noted within the right lung. No specific features are identified to suggest local tumor recurrence.  No axillary or supraclavicular adenopathy. Related through the upper abdomen is on unremarkable. There are no acute findings identified. The adrenal glands both appear normal. Stable cyst within the upper pole of the left kidney measuring 2.4 cm, image 64/series 2.  Review of the visualized osseous structures is significant for mild scoliosis and multilevel spondylosis involving the thoracic spine.  IMPRESSION: 1. Progressive changes of external beam radiation are identified within the right lung. 2. No evidence for residual  or recurrence of tumor.   Electronically Signed   By: Kerby Moors M.D.   On: 01/01/2014 14:04   CLINICAL DATA: Status post lung surgery, cough shortness of breath,  hemoptysis  EXAM:  CT ANGIOGRAPHY CHEST WITH CONTRAST  TECHNIQUE:  Multidetector CT imaging of the chest was performed using the  standard protocol during bolus administration of intravenous  contrast. Multiplanar CT image reconstructions including MIPs were  obtained to evaluate the vascular anatomy.  CONTRAST: 12m OMNIPAQUE IOHEXOL 350 MG/ML SOLN  COMPARISON: 06/27/2013  FINDINGS:  The thoracic inlet is unremarkable. The mediastinum demonstrates  linearly oriented increased soft tissue density within the anterior  superior right hilar region extending posteriorly. Linear areas of  high density are appreciated within these findings. Considering the  patient's history these findings abdominal wall appearance of  postsurgical changes. It appears stable when compared to the  previous study. Residual or recurrent disease cannot be excluded.  The There is no evidence of filling defects within the main, lobar,  or segmental pulmonary arteries.  The lung parenchyma demonstrates diffuse airspace disease in the  posterior aspect of the right upper lobe, superior segment right  lower lobe. Mild increased density projects in the the base of the  left lower lobe.  Visualized upper abdominal viscera grossly unremarkable. Multilevel  degenerative changes are appreciated within the thoracic spine.  Review of the MIP images confirms the above findings.  IMPRESSION:  1. No CT evidence of pulmonary arterial embolic disease  2. Diffuse areas of pneumonitis within the superior segment right  lower lobe and to a lesser extent at the posterior aspect of the  right upper lobe differential considerations are postsurgical  changes versus an infectious or inflammatory pneumonitis. Pulmonary  hemorrhage considering the patient's history is  a diagnostic  consideration. Asymmetric edema is of much lower consideration  considering the findings in the remaining portions of the lung.  Electronically Signed  By: HMargaree MackintoshM.D.  On: 09/07/2013 14:26   Ct Chest W Contrast  06/27/2013   *RADIOLOGY REPORT*  Clinical Data: Staging lung cancer  CT CHEST WITH CONTRAST  Technique:  Multidetector CT imaging of the chest was performed following the standard protocol during bolus administration of intravenous contrast.  Contrast: 89mOMNIPAQUE IOHEXOL 300 MG/ML  SOLN  Comparison: 02/14/2013  Findings: No pleural effusion identified.  The patient is status  post right upper lobectomy. There is a new sub solid density within the right lung base measuring 1.1 x 1.8 cm, image 41/series 6. This is new from the previous exam.  Within the left lower lobe there is a 5 mm nodule, image 46/series 5.  Heart size appears normal.  There is no mediastinal or hilar adenopathy identified.  There is no enlarged axillary or supraclavicular adenopathy.  Limited imaging through the upper abdomen shows bilateral renal cysts.  No acute findings identified.  No mass or adenopathy.  Review of the visualized bony structures is significant for mild scoliosis and multilevel spondylosis.  IMPRESSION:  1.  No acute cardiopulmonary abnormalities. 2.  Postoperative changes from right upper lobectomy. 3.  New sub solid density within the right lower lobe is nonspecific and may be treatment related.  Recommend follow-up imaging in 3 months to confirm persistence.   Original Report Authenticated By: Kerby Moors, M.D.    Recent Labs: Lab Results  Component Value Date   WBC 7.7 01/14/2015   HGB 15.9 01/14/2015   HCT 47.1 01/14/2015   PLT 180 01/14/2015   GLUCOSE 97 01/14/2015   CHOL 206* 06/19/2014   TRIG 216.0* 06/19/2014   HDL 38.50* 06/19/2014   LDLDIRECT 144.0 06/19/2014   ALT 18 01/14/2015   AST 17 01/14/2015   NA 141 01/14/2015   K 4.4 01/14/2015   CL 100 09/07/2013    CREATININE 1.0 01/14/2015   BUN 14.3 01/14/2015   CO2 24 01/14/2015   INR 0.99 02/20/2013   PATH: stage IIIA,  EGFR: Exon 19 deletion  Mutation detected Diagnosis 1. Lung, resection (segmental or lobe), Right upper lobe - ADENOCARCINOMA, 3.4 CM. - MARGINS NOT INVOLVED. - VISCERAL PLEURA FREE OF TUMOR. - ONE BENIGN LYMPH NODE. 2. Lymph node, biopsy, Right upper 4 R - METASTATIC ADENOCARCINOMA. 3. Lymph node, biopsy, Right 2 R - ANTHRACOTIC LYMPH NODE. NO TUMOR IDENTIFIED. Microscopic Comment 1. LUNG Specimen, including laterality: Right lung lobe. Procedure: Lobectomy. Specimen integrity (intact/disrupted): Intact. Tumor site: Right upper lobe. Tumor focality: Unifocal Maximum tumor size (cm): 3.4 cm. Histologic type: Adenocarcinoma, bronchioalveolar cell type. Grade: I. Margins: Free of tumor. Visceral pleura invasion: No. Tumor extension: Within lobe. Treatment effect (if treated with neoadjuvant therapy): No. Lymph -Vascular invasion: Present. Lymph nodes: Number examined - 3 ; Number N1 nodes positive - 0 ; Number N2 nodes positive - 1 TNM code: pT2a , pN2, pMX Ancillary studies: EGFR and ALK pending. Non-neoplastic lung: Unremarkable     Assessment / Plan:    Stable following resection of right upper lobe, lung cancer which was found to be EGFR positive adenocarcinoma stage IIIa.Plan see the patient back in 6 months after his next CT scan    Grace Isaac 01/23/2015 12:31 PM

## 2015-02-05 ENCOUNTER — Telehealth: Payer: Self-pay | Admitting: Cardiovascular Disease

## 2015-02-05 ENCOUNTER — Telehealth: Payer: Self-pay | Admitting: *Deleted

## 2015-02-05 NOTE — Telephone Encounter (Signed)
Patient return call and patient states he would prefer to see Dr Gwenlyn Found only. PATIENT states he was suppose to have his annual appointment this month. Patient states he would like to cancel appointment for 02/06/15. RN apologize to patient for not being able to have a schedule appointment this month - but can be placed on a waitlist  Patient states he is flexible- prefer late morning or early afternoon.

## 2015-02-05 NOTE — Telephone Encounter (Signed)
Spoke to patient 's wife . Patient is not at home. He is at a dermatology office.wife states patient has been having chest tightness and S.OB She could not tell for how long. Wife states patient had lung ca 2 years - negative now. Wife state had an some episode earlier- no NTG USED. Would like an appointment sooner. appointment schedule for extender tomorrow 02/06/15 at 11:30 am [address] with Cecilie Kicks np.  wife verbalized understanding and would like it to left on answering machine- done.

## 2015-02-05 NOTE — Telephone Encounter (Signed)
New Message  Pt wanted to speak w/ Rn about recent chest tightness and slight SoB. Pt wanted to speak w/ Rn about what to do going forward. Pt has appt w/ Gwenlyn Found for 5/25. Please call back and discuss.

## 2015-02-06 ENCOUNTER — Ambulatory Visit: Payer: Federal, State, Local not specified - PPO | Admitting: Cardiology

## 2015-02-12 ENCOUNTER — Ambulatory Visit (INDEPENDENT_AMBULATORY_CARE_PROVIDER_SITE_OTHER): Payer: Federal, State, Local not specified - PPO | Admitting: Cardiovascular Disease

## 2015-02-12 ENCOUNTER — Encounter: Payer: Self-pay | Admitting: Cardiovascular Disease

## 2015-02-12 DIAGNOSIS — R079 Chest pain, unspecified: Secondary | ICD-10-CM

## 2015-02-12 NOTE — Patient Instructions (Signed)
  We will see you back in follow up after the tests.   Dr Gwenlyn Found has ordered: 1.  Echocardiogram. Echocardiography is a painless test that uses sound waves to create images of your heart. It provides your doctor with information about the size and shape of your heart and how well your heart's chambers and valves are working. This procedure takes approximately one hour. There are no restrictions for this procedure.   2. Exercise Myoview- this is a test that looks at the blood flow to your heart muscle.  It takes approximately 2 1/2 hours. Please follow instruction sheet, as given.

## 2015-02-12 NOTE — Assessment & Plan Note (Signed)
Recent increase in frequency and severity of exertional chest pain and dyspnea. I'm going to obtain a 2-D echocardiogram and an exercise Myoview stress test to rule out an ischemic etiology.

## 2015-02-12 NOTE — Progress Notes (Signed)
02/12/2015 Alvin Hardy   [birth year]  [id number]  Primary Physician Alvin Ly, MD Primary Cardiologist: Alvin Harp MD Alvin Hardy   HPI:  Mr. Alvin Hardy is a delightful 80 year old fit-appearing married Caucasian male father of 2 children, grandfather of 2 grandchildren referred by Dr. Crist Hardy for cardiovascular evaluation. I last saw him in the one year ago. 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 His cardiovascular risk factor profile is markable for hyperlipidemia intolerant to statin drugs and family history of heart disease with a father who had a myocardial infarction at age 22. He is nonhypertensive, diabetic nor has she smoked in the in the past. He has never had a heart attack or stroke. He was getting occasional chest pain that occurred both with exertion as well as at rest and after eating. He has had lung cancer and has had a VATS procedure by Dr. Servando Hardy of the right upper lobe along with chemotherapy and radiation therapy. He has seen a gastroenterologist and has had EGD and is going to be referred to Dr. Malvin Hardy for pulmonary evaluation. A Myoview stress test was performed that was entirely normal as were her lower extremity Doppler studies. Because of the statin intolerance he was recently begun on Repatha by Dr. Joylene Hardy the marked reduction in his cholesterol levels. Over the last 6 weeks or so he's had progressive increase in frequency and severity of exertional chest pressure as well as dyspnea on exertion.   Current Outpatient Prescriptions  Medication Sig Dispense Refill  . aspirin EC 81 MG tablet Take 81 mg by mouth every morning.     . calcipotriene (DOVONOX) 0.005 % cream Apply 1 application topically daily as needed (for hands).     . Calcium Carbonate-Vitamin D (CALTRATE 600+D) 600-400 MG-UNIT per tablet Take 1 tablet by mouth every morning.    . Cholecalciferol (VITAMIN D-3) 1000 UNITS CAPS Take 1,000 Units by mouth every morning.     .  clobetasol cream (TEMOVATE) 8.34 % Apply 1 application topically daily.     . clonazePAM (KLONOPIN) 0.5 MG tablet Take 0.5 mg by mouth at bedtime.    . cyanocobalamin 1000 MCG tablet Take 1,000 mcg by mouth every morning.     Marland Kitchen esomeprazole (NEXIUM) 40 MG capsule Take 40 mg by mouth daily before breakfast.    . Evolocumab (REPATHA SURECLICK [place]) Inject 196 mg into the skin every 21 ( twenty-one) days. Injects every 2 weeks/ study program with [employer]    . finasteride (PROSCAR) 5 MG tablet Take 5 mg by mouth daily before breakfast.     . gabapentin (NEURONTIN) 300 MG capsule Take 600-900 mg by mouth 2 (two) times daily. He takes two capsules every morning and three capsules at bedtime.    Marland Kitchen lithium carbonate (LITHOBID) 300 MG CR tablet Take 600 mg by mouth at bedtime.     . methotrexate (RHEUMATREX) 2.5 MG tablet Take 3 tablets by mouth once a week.  0  . Multiple Vitamin (MULTIVITAMIN WITH MINERALS) TABS tablet Take 1 tablet by mouth every morning.    Marland Kitchen OVER THE COUNTER MEDICATION Take 1 tablet by mouth 2 (two) times daily. Glucosamine Chondroitin 1500mg /1200mg     . propranolol (INDERAL) 10 MG tablet Take 10-20 mg by mouth daily as needed (For extremely stressful circumstances.).     Marland Kitchen tamsulosin (FLOMAX) 0.4 MG CAPS capsule Take 0.4 mg by mouth daily.  3  . testosterone cypionate (DEPOTESTOTERONE CYPIONATE) 200 MG/ML injection Inject 0.5 mLs into  the muscle once a week.  1  . vardenafil (LEVITRA) 5 MG tablet Take 5 mg by mouth daily as needed for erectile dysfunction.      No current facility-administered medications for this visit.    Allergies  Allergen Reactions  . Codeine Anaphylaxis  . Decadron [Dexamethasone]     Pt says it is contraindication due to the lithium he is taking  . Other     Pt. Remarks that all pain med. Make him nauseated   . Statins     Lipitor, Crestor, Zocor all caused muscle aches  . Zetia [Ezetimibe]     Muscle aches  . Zoloft [Sertraline Hcl] Other (See  Comments)    REACTION:  unknown    History   Social History  . Marital Status: Married    Spouse Name: N/A  . Number of Children: N/A  . Years of Education: N/A   Occupational History  . Not on file.   Social History Main Topics  . Smoking status: Former Smoker    Types: Cigarettes    Quit date: 11/01/1978  . Smokeless tobacco: Not on file  . Alcohol Use: Yes     Comment: occas.  . Drug Use: No  . Sexual Activity: Not on file   Other Topics Concern  . Not on file   Social History Narrative     Review of Systems: General: negative for chills, fever, night sweats or weight changes.  Cardiovascular: negative for chest pain, dyspnea on exertion, edema, orthopnea, palpitations, paroxysmal nocturnal dyspnea or shortness of breath Dermatological: negative for rash Respiratory: negative for cough or wheezing Urologic: negative for hematuria Abdominal: negative for nausea, vomiting, diarrhea, bright red blood per rectum, melena, or hematemesis Neurologic: negative for visual changes, syncope, or dizziness All other systems reviewed and are otherwise negative except as noted above.    Blood pressure 136/70, pulse 104, height 5\' 11"  (1.803 m), weight 193 lb 9.6 oz (87.816 kg).  General appearance: alert and no distress Neck: no adenopathy, no carotid bruit, no JVD, supple, symmetrical, trachea midline and thyroid not enlarged, symmetric, no tenderness/mass/nodules Lungs: clear to auscultation bilaterally Heart: regular rate and rhythm, S1, S2 normal, no murmur, click, rub or gallop Extremities: extremities normal, atraumatic, no cyanosis or edema Pulses: 2+ and symmetric  EKG sinus tachycardia 104 without ST or T-wave changes. I personally reviewed this EKG  ASSESSMENT AND PLAN:   Hyperlipidemia History of hyperlipidemia intolerant to statin drugs. He was recently begun on Repatha with a dramatic decline in his cholesterol levels only after several weeks of therapy. His  total cholesterol was 125 with an LDL of 45 and HDL 45.   Chest pain Recent increase in frequency and severity of exertional chest pain and dyspnea. I'm going to obtain a 2-D echocardiogram and an exercise Myoview stress test to rule out an ischemic etiology.       Alvin Harp MD FACP,FACC,FAHA, [facility] 02/12/2015 2:54 PM

## 2015-02-12 NOTE — Assessment & Plan Note (Signed)
History of hyperlipidemia intolerant to statin drugs. He was recently begun on Repatha with a dramatic decline in his cholesterol levels only after several weeks of therapy. His total cholesterol was 125 with an LDL of 45 and HDL 45.

## 2015-02-13 ENCOUNTER — Encounter: Payer: Self-pay | Admitting: Cardiovascular Disease

## 2015-02-18 ENCOUNTER — Telehealth (HOSPITAL_COMMUNITY): Payer: Self-pay

## 2015-02-18 ENCOUNTER — Ambulatory Visit (HOSPITAL_COMMUNITY)
Admission: RE | Admit: 2015-02-18 | Discharge: 2015-02-18 | Disposition: A | Discharge status: Home / Self Care | Payer: Medicare Other | Source: Ambulatory Visit | Attending: Cardiology | Admitting: Cardiology

## 2015-02-18 DIAGNOSIS — R079 Chest pain, unspecified: Secondary | ICD-10-CM | POA: Diagnosis not present

## 2015-02-18 NOTE — Progress Notes (Signed)
2D Echocardiogram Complete.  02/18/2015   Glorious Flicker [place], [place]

## 2015-02-18 NOTE — Telephone Encounter (Signed)
Encounter complete. 

## 2015-02-20 ENCOUNTER — Ambulatory Visit (HOSPITAL_COMMUNITY)
Admission: RE | Admit: 2015-02-20 | Discharge: 2015-02-20 | Disposition: A | Discharge status: Home / Self Care | Payer: Federal, State, Local not specified - PPO | Source: Ambulatory Visit | Attending: Cardiovascular Disease | Admitting: Cardiovascular Disease

## 2015-02-20 DIAGNOSIS — Z8249 Family history of ischemic heart disease and other diseases of the circulatory system: Secondary | ICD-10-CM | POA: Insufficient documentation

## 2015-02-20 DIAGNOSIS — R0609 Other forms of dyspnea: Secondary | ICD-10-CM | POA: Diagnosis not present

## 2015-02-20 DIAGNOSIS — E663 Overweight: Secondary | ICD-10-CM | POA: Diagnosis not present

## 2015-02-20 DIAGNOSIS — Z87891 Personal history of nicotine dependence: Secondary | ICD-10-CM | POA: Diagnosis not present

## 2015-02-20 DIAGNOSIS — E785 Hyperlipidemia, unspecified: Secondary | ICD-10-CM | POA: Insufficient documentation

## 2015-02-20 DIAGNOSIS — R5383 Other fatigue: Secondary | ICD-10-CM | POA: Insufficient documentation

## 2015-02-20 DIAGNOSIS — R079 Chest pain, unspecified: Secondary | ICD-10-CM | POA: Diagnosis present

## 2015-02-20 MED ORDER — TECHNETIUM TC 99M SESTAMIBI GENERIC - CARDIOLITE
31.2000 | Freq: Once | INTRAVENOUS | Status: AC | PRN
  Administered 2015-02-20: 31.2 via INTRAVENOUS

## 2015-02-20 MED ORDER — TECHNETIUM TC 99M SESTAMIBI GENERIC - CARDIOLITE
10.5000 | Freq: Once | INTRAVENOUS | Status: AC | PRN
  Administered 2015-02-20: 11 via INTRAVENOUS

## 2015-02-20 NOTE — Procedures (Addendum)
[facility] [address] [place] [address] [phone]  Cardiology Nuclear Med Study  Alvin Hardy is a 80 y.o. male     MRN : [id number]     DOB: [birth year]  Procedure Date: 02/20/2015  Nuclear Med Background Indication for Stress Test:  Evaluation for Ischemia History:  No prior cardiac or respiratory history reported; Last NUC MPI on 02/08/2014 Cardiac Risk Factors: Family History - CAD, History of Smoking, Lipids and Overweight  Symptoms:  Chest Pain, DOE and Fatigue   Nuclear Pre-Procedure Caffeine/Decaff Intake:  1:30am NPO After: 9:30am   IV Site: R Forearm  IV 0.9% NS with Angio Cath:  22g  Chest Size (in):  44"  IV Started by: Larene Beach, RN  Height: '5\' 11"'$  (1.803 m)  Cup Size: n/a  BMI:  Body mass index is 26.93 kg/(m^2). Weight:  193 lb (87.544 kg)   Tech Comments:  n/a    Nuclear Med Study 1 or 2 day study: 1 day  Stress Test Type:  Stress  Order Authorizing Provider:  Quay Burow, MD   Resting Radionuclide: Technetium 40mSestamibi  Resting Radionuclide Dose: 10.5 mCi   Stress Radionuclide:  Technetium 989mestamibi  Stress Radionuclide Dose: 31.2 mCi           Stress Protocol Rest HR: 95 Stress HR: 125  Rest BP: 133/96 Stress BP: 181/84  Exercise Time (min): 7. METS: 7.0   Predicted Max HR: 143 bpm % Max HR: 87.41 bpm Rate Pressure Product: 22625  Dose of Adenosine (mg):  n/a Dose of Lexiscan: n/a mg  Dose of Atropine (mg): n/a Dose of Dobutamine: n/a mcg/kg/min (at max HR)  Stress Test Technologist: TeLeane ParaCCT Nuclear Technologist:Elizabeth Young,CNMT   Rest Procedure:  Myocardial perfusion imaging was performed at rest 45 minutes following the intravenous administration of Technetium 9987mstamibi. Stress Procedure:  The patient performed treadmill exercise using a Bruce  Protocol for 7 minutes. The patient stopped due to severe SOB and denied any chest pain.  There  were no significant ST-T wave changes.  Technetium 1m4mtamibi was injected IV at peak exercise and myocardial perfusion imaging was performed after a brief delay.  Transient Ischemic Dilatation (Normal <1.22):  1.00  QGS EDV:  76 ml QGS ESV:  29 ml LV Ejection Fraction: 62%     Rest ECG: NSR - Normal EKG  Stress ECG: No significant change from baseline ECG  QPS Raw Data Images:  Normal; no motion artifact; normal heart/lung ratio. Stress Images:  Small focal mild apical defect Rest Images:  Very mild small region of apical thinning. Subtraction (SDS):  No significant reversibility is appreciated.  Impression Exercise Capacity:  Good exercise capacity. BP Response:  Normal blood pressure response. Clinical Symptoms:  Shortness of breath ECG Impression:  No significant ST segment change suggestive of ischemia. Comparison with Prior Nuclear Study: No significant change from previous study  Overall Impression:  Low risk stress nuclear study demonstrating a small mild focal apical defect (extent 4%) without significant ischemia which may be contributed by attenuation.   LV Wall Motion:  NL LV Function, EF 62%; NL Wall Motion   KELLY,THOMAS A, MD  02/20/2015 5:57 PM

## 2015-02-21 ENCOUNTER — Telehealth (HOSPITAL_COMMUNITY): Payer: Self-pay

## 2015-02-27 NOTE — Telephone Encounter (Signed)
Encounter complete. 

## 2015-02-28 ENCOUNTER — Telehealth: Payer: Self-pay | Admitting: Cardiovascular Disease

## 2015-02-28 NOTE — Telephone Encounter (Signed)
Spoke with pt, aware of myoview results. He is having a pins and needles type sensation on the left side of his face. It has been going on for awhile and it seems to be getting more pronounced and frequent. He would like to know what dr berry thinks of this and what it maybe. Will forward for dr berry's review

## 2015-02-28 NOTE — Telephone Encounter (Signed)
Pt called and says he is surprised that he have not received his results from his test he had last week.

## 2015-02-28 NOTE — Telephone Encounter (Signed)
Will have to be seen back in the office to discuss this

## 2015-03-03 NOTE — Telephone Encounter (Signed)
I spoke with patient and offered him an appt for 5/17, but he refused that appt saying that he was going out of town.  I will keep him on my cancellation list for an appt before 6/22. Patient verbalized understanding.

## 2015-03-18 ENCOUNTER — Ambulatory Visit: Payer: Federal, State, Local not specified - PPO | Admitting: Cardiovascular Disease

## 2015-03-26 ENCOUNTER — Ambulatory Visit: Payer: Federal, State, Local not specified - PPO | Admitting: Cardiovascular Disease

## 2015-03-27 ENCOUNTER — Ambulatory Visit (INDEPENDENT_AMBULATORY_CARE_PROVIDER_SITE_OTHER): Payer: Federal, State, Local not specified - PPO | Admitting: Cardiovascular Disease

## 2015-03-27 ENCOUNTER — Encounter: Payer: Self-pay | Admitting: Cardiovascular Disease

## 2015-03-27 VITALS — BP 108/62 | HR 100 | Ht 71.0 in | Wt 190.6 lb

## 2015-03-27 DIAGNOSIS — Z136 Encounter for screening for cardiovascular disorders: Secondary | ICD-10-CM | POA: Diagnosis not present

## 2015-03-27 DIAGNOSIS — E785 Hyperlipidemia, unspecified: Secondary | ICD-10-CM

## 2015-03-27 DIAGNOSIS — Z8249 Family history of ischemic heart disease and other diseases of the circulatory system: Secondary | ICD-10-CM

## 2015-03-27 DIAGNOSIS — R079 Chest pain, unspecified: Secondary | ICD-10-CM

## 2015-03-27 DIAGNOSIS — IMO0001 Reserved for inherently not codable concepts without codable children: Secondary | ICD-10-CM

## 2015-03-27 NOTE — Progress Notes (Signed)
Alvin Hardy returns here for follow-up of his 2-D echo and Myoview stress test.his Myoview showed diaphragmatic attenuation and apical thinning and his echo was normal. He continues to have exertional substernal chest pressure several times a week along with dyspnea on exertion. Given his premature family history of heart disease with the right and a myocardial infarction at age 78 and hyperlipidemia I suggested that we proceed with operation clinic catheterization however the patient prefers a less invasive approach initially. I'm going to order a cardiac CTA to rule out obstructive disease.   Lorretta Harp, M.D., [place], [facility], [place] [address] [place], [place]  [address]  [phone] 03/27/2015 4:10 PM

## 2015-03-27 NOTE — Assessment & Plan Note (Signed)
Alvin Hardy returns here for follow-up of his 2-D echo and Myoview stress test.his Myoview showed diaphragmatic attenuation and apical thinning and his echo was normal. He continues to have exertional substernal chest pressure several times a week along with dyspnea on exertion. Given his premature family history of heart disease with the right and a myocardial infarction at age 78 and hyperlipidemia I suggested that we proceed with operation clinic catheterization however the patient prefers a less invasive approach initially. I'm going to order a cardiac CTA to rule out obstructive disease.

## 2015-03-27 NOTE — Patient Instructions (Signed)
  We will see you back in follow up after the test.   Dr Gwenlyn Found has ordered:  Cardiac CTA with calcium score

## 2015-03-27 NOTE — Telephone Encounter (Signed)
appt made for 5/26

## 2015-03-28 ENCOUNTER — Encounter: Payer: Self-pay | Admitting: Cardiovascular Disease

## 2015-04-10 ENCOUNTER — Encounter (HOSPITAL_COMMUNITY): Payer: Self-pay

## 2015-04-10 ENCOUNTER — Ambulatory Visit (HOSPITAL_COMMUNITY)
Admission: RE | Admit: 2015-04-10 | Discharge: 2015-04-10 | Disposition: A | Discharge status: Home / Self Care | Payer: Federal, State, Local not specified - PPO | Source: Ambulatory Visit | Attending: Cardiovascular Disease | Admitting: Cardiovascular Disease

## 2015-04-10 DIAGNOSIS — I2510 Atherosclerotic heart disease of native coronary artery without angina pectoris: Secondary | ICD-10-CM | POA: Insufficient documentation

## 2015-04-10 DIAGNOSIS — K449 Diaphragmatic hernia without obstruction or gangrene: Secondary | ICD-10-CM | POA: Diagnosis not present

## 2015-04-10 DIAGNOSIS — R079 Chest pain, unspecified: Secondary | ICD-10-CM | POA: Insufficient documentation

## 2015-04-10 DIAGNOSIS — Z8249 Family history of ischemic heart disease and other diseases of the circulatory system: Secondary | ICD-10-CM | POA: Insufficient documentation

## 2015-04-10 DIAGNOSIS — E785 Hyperlipidemia, unspecified: Secondary | ICD-10-CM | POA: Insufficient documentation

## 2015-04-10 DIAGNOSIS — IMO0001 Reserved for inherently not codable concepts without codable children: Secondary | ICD-10-CM

## 2015-04-10 MED ORDER — NITROGLYCERIN 0.4 MG SL SUBL
SUBLINGUAL_TABLET | SUBLINGUAL | Status: AC
  Administered 2015-04-10: 0.8 mg via SUBLINGUAL
  Filled 2015-04-10: qty 1

## 2015-04-10 MED ORDER — METOPROLOL TARTRATE 1 MG/ML IV SOLN
2.5000 mg | Freq: Once | INTRAVENOUS | Status: AC
  Administered 2015-04-10: 2.5 mg via INTRAVENOUS
  Filled 2015-04-10: qty 5

## 2015-04-10 MED ORDER — NITROGLYCERIN 0.4 MG SL SUBL
0.8000 mg | SUBLINGUAL_TABLET | Freq: Once | SUBLINGUAL | Status: AC
  Administered 2015-04-10: 0.8 mg via SUBLINGUAL
  Filled 2015-04-10: qty 25

## 2015-04-10 MED ORDER — METOPROLOL TARTRATE 1 MG/ML IV SOLN
INTRAVENOUS | Status: AC
  Administered 2015-04-10: 5 mg via INTRAVENOUS
  Filled 2015-04-10: qty 15

## 2015-04-10 MED ORDER — NITROGLYCERIN 0.4 MG SL SUBL
SUBLINGUAL_TABLET | SUBLINGUAL | Status: AC
  Filled 2015-04-10: qty 1

## 2015-04-10 MED ORDER — METOPROLOL TARTRATE 1 MG/ML IV SOLN
5.0000 mg | Freq: Once | INTRAVENOUS | Status: AC
  Administered 2015-04-10: 5 mg via INTRAVENOUS
  Filled 2015-04-10: qty 5

## 2015-04-10 MED ORDER — IOHEXOL 350 MG/ML SOLN
80.0000 mL | Freq: Once | INTRAVENOUS | Status: AC | PRN
  Administered 2015-04-10: 80 mL via INTRAVENOUS

## 2015-04-11 ENCOUNTER — Telehealth: Payer: Self-pay | Admitting: Cardiovascular Disease

## 2015-04-11 ENCOUNTER — Encounter: Payer: Self-pay | Admitting: *Deleted

## 2015-04-11 NOTE — Telephone Encounter (Signed)
I spoke with patient and gave results.  Copy mailed to patient and released to [facility]

## 2015-04-11 NOTE — Telephone Encounter (Signed)
Pt says he would like the results of his test yesterday. Also would like a copy mailed to him.If not there,please call-[phone].

## 2015-04-23 ENCOUNTER — Ambulatory Visit: Payer: Federal, State, Local not specified - PPO | Admitting: Cardiovascular Disease

## 2015-06-30 ENCOUNTER — Telehealth: Payer: Self-pay | Admitting: Cardiovascular Disease

## 2015-06-30 NOTE — Telephone Encounter (Signed)
Alvin Hardy, are you familiar with the forms that they are speaking of?  I do not have the forms.

## 2015-06-30 NOTE — Telephone Encounter (Signed)
Calling about a fax in which he had fax about a prescription Repatha it was a 5 page fax that he sent over for Dr. Gwenlyn Found ,two weeks ago  Please call .Marland KitchenMarland Kitchen

## 2015-07-01 NOTE — Telephone Encounter (Signed)
Spoke with patient, his denial appears to be due to lack of statin use evidence, as well as ezetimibe.  Also he was on Repatha start program so LDL was < 100.  Advised him to review with Dr. Joylene Draft regarding repeat labs and pulling med history.  Also suggested he contact [facility] to get 10 year history.  If denial comes down to needing cardiologist prescribing we can step in.

## 2015-07-22 ENCOUNTER — Ambulatory Visit (HOSPITAL_COMMUNITY)
Admission: RE | Admit: 2015-07-22 | Discharge: 2015-07-22 | Disposition: A | Discharge status: Home / Self Care | Payer: Federal, State, Local not specified - PPO | Source: Ambulatory Visit | Attending: Internal Medicine | Admitting: Internal Medicine

## 2015-07-22 ENCOUNTER — Other Ambulatory Visit (HOSPITAL_BASED_OUTPATIENT_CLINIC_OR_DEPARTMENT_OTHER): Payer: Federal, State, Local not specified - PPO

## 2015-07-22 ENCOUNTER — Encounter (HOSPITAL_COMMUNITY): Payer: Self-pay

## 2015-07-22 DIAGNOSIS — Z9221 Personal history of antineoplastic chemotherapy: Secondary | ICD-10-CM | POA: Diagnosis not present

## 2015-07-22 DIAGNOSIS — N289 Disorder of kidney and ureter, unspecified: Secondary | ICD-10-CM | POA: Diagnosis not present

## 2015-07-22 DIAGNOSIS — Z923 Personal history of irradiation: Secondary | ICD-10-CM | POA: Insufficient documentation

## 2015-07-22 DIAGNOSIS — Z902 Acquired absence of lung [part of]: Secondary | ICD-10-CM | POA: Insufficient documentation

## 2015-07-22 DIAGNOSIS — C3411 Malignant neoplasm of upper lobe, right bronchus or lung: Secondary | ICD-10-CM | POA: Diagnosis not present

## 2015-07-22 LAB — COMPREHENSIVE METABOLIC PANEL (CC13)
ALT: 25 U/L (ref 0–55)
AST: 20 U/L (ref 5–34)
Albumin: 4.3 g/dL (ref 3.5–5.0)
Alkaline Phosphatase: 50 U/L (ref 40–150)
Anion Gap: 7 mEq/L (ref 3–11)
BUN: 14.9 mg/dL (ref 7.0–26.0)
CHLORIDE: 106 meq/L (ref 98–109)
CO2: 29 meq/L (ref 22–29)
CREATININE: 1.1 mg/dL (ref 0.7–1.3)
Calcium: 9.2 mg/dL (ref 8.4–10.4)
EGFR: 65 mL/min/{1.73_m2} — ABNORMAL LOW (ref >90)
Glucose: 96 mg/dl (ref 70–140)
Potassium: 4.3 mEq/L (ref 3.5–5.1)
SODIUM: 142 meq/L (ref 136–145)
Total Bilirubin: 1.44 mg/dL — ABNORMAL HIGH (ref 0.20–1.20)
Total Protein: 6.8 g/dL (ref 6.4–8.3)

## 2015-07-22 MED ORDER — IOHEXOL 300 MG/ML  SOLN
75.0000 mL | Freq: Once | INTRAMUSCULAR | Status: AC | PRN
  Administered 2015-07-22: 75 mL via INTRAVENOUS

## 2015-07-24 ENCOUNTER — Ambulatory Visit: Payer: Federal, State, Local not specified - PPO | Admitting: Cardiothoracic Surgery

## 2015-07-24 ENCOUNTER — Ambulatory Visit (INDEPENDENT_AMBULATORY_CARE_PROVIDER_SITE_OTHER): Payer: Federal, State, Local not specified - PPO | Admitting: Cardiothoracic Surgery

## 2015-07-24 ENCOUNTER — Encounter: Payer: Self-pay | Admitting: Cardiothoracic Surgery

## 2015-07-24 VITALS — BP 133/85 | HR 94 | Resp 20 | Ht 71.0 in | Wt 190.0 lb

## 2015-07-24 DIAGNOSIS — Z85118 Personal history of other malignant neoplasm of bronchus and lung: Secondary | ICD-10-CM

## 2015-07-24 NOTE — Progress Notes (Signed)
[address]       [place] [address]             [phone]                 Alvin [facility] Medical Record #[id number] Date of Birth: [birth year]  Alvin Bears, MD Jerlyn Ly, MD  Chief Complaint:   PostOp Follow Up Visit  02/21/2013   PREOPERATIVE DIAGNOSIS: Right upper lobe lung mass suspicious for  malignancy.  POSTOPERATIVE DIAGNOSIS: Right upper lobe lung mass suspicious for  malignancy. 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  PROCEDURE PERFORMED: Bronchoscopy, right upper lobe. Bronchoscopy  right video-assisted thoracoscopy, mini thoracotomy and right upper lobe  resection with lymph node dissection and placement of On-Q device.  SURGEON: Lanelle Bal, MD   Stage III A (T2 A., N2, M0) non-small cell lung cancer, adenocarcinoma with positive EGFR mutation in exon 19 and negative ALK gene translocation diagnosed in April 2014   Treatment :  1) Status post right upper lobectomy with lymph node dissection   02/21/2013.  2) Systemic adjuvant chemotherapy with carboplatin for AUC of 5 and Alimta 500 mg/M2 every 3 weeks. Status post 4 cycles. Last cycle was given on 05/29/2013. (Carboplatin was used in place of cisplatin secondary to concern about intolerability and significant adverse effects of cisplatin). Dr Julien Nordmann 3) Adjuvant radiotherapy to the mediastinum under the care of Dr. Lisbeth Renshaw completed 08/27/2013.      History of Present Illness:     Patient returns now 30  months postoperatively. He is return to normal activities without limitations.    History  Smoking status  . Former Smoker  . Types: Cigarettes  . Quit date: 11/01/1978  Smokeless tobacco  . Not on file       Allergies  Allergen Reactions  . Codeine Anaphylaxis  . Decadron [Dexamethasone]     Pt says it is contraindication due to the lithium he is taking  . Other     Pt. Remarks that all pain med. Make him  nauseated   . Statins     Lipitor, Crestor, Zocor all caused muscle aches  . Zetia [Ezetimibe]     Muscle aches  . Zoloft [Sertraline Hcl] Other (See Comments)    REACTION:  unknown    Current Outpatient Prescriptions  Medication Sig Dispense Refill  . aspirin EC 81 MG tablet Take 81 mg by mouth every morning.     . Calcium Carbonate-Vitamin D (CALTRATE 600+D) 600-400 MG-UNIT per tablet Take 1 tablet by mouth every morning.    . Cholecalciferol (VITAMIN D-3) 1000 UNITS CAPS Take 1,000 Units by mouth every morning.     . clobetasol cream (TEMOVATE) 5.88 % Apply 1 application topically daily.     . clonazePAM (KLONOPIN) 0.5 MG tablet Take 0.5 mg by mouth at bedtime.    . cyanocobalamin 1000 MCG tablet Take 1,000 mcg by mouth every morning.     Marland Kitchen esomeprazole (NEXIUM) 40 MG capsule Take 40 mg by mouth daily before breakfast.    . finasteride (PROSCAR) 5 MG tablet Take 5 mg by mouth daily before breakfast.     . gabapentin (NEURONTIN) 300 MG capsule Take 600-900 mg by mouth 2 (two) times daily. He takes two capsules every morning and three capsules at bedtime.    Marland Kitchen lithium carbonate (LITHOBID) 300 MG CR tablet Take 600 mg by mouth at bedtime.     Marland Kitchen  Multiple Vitamin (MULTIVITAMIN WITH MINERALS) TABS tablet Take 1 tablet by mouth every morning.    Marland Kitchen OVER THE COUNTER MEDICATION Take 1 tablet by mouth 2 (two) times daily. Glucosamine Chondroitin 1563m/1200mg    . pimecrolimus (ELIDEL) 1 % cream Apply 1 application topically 2 (two) times daily.    . propranolol (INDERAL) 10 MG tablet Take 10-20 mg by mouth daily as needed (For extremely stressful circumstances.).     .Marland Kitchentamsulosin (FLOMAX) 0.4 MG CAPS capsule Take 0.4 mg by mouth daily.  3  . testosterone cypionate (DEPOTESTOTERONE CYPIONATE) 200 MG/ML injection Inject 0.5 mLs into the muscle once a week.  1  . vardenafil (LEVITRA) 5 MG tablet Take 5 mg by mouth daily as needed for erectile dysfunction.      No current facility-administered  medications for this visit.       Physical Exam: BP 133/85 mmHg  Pulse 94  Resp 20  Ht '5\' 11"'  (1.803 m)  Wt 190 lb (86.183 kg)  BMI 26.51 kg/m2  SpO2 98%  Wt Readings from Last 3 Encounters:  07/24/15 190 lb (86.183 kg)  03/27/15 190 lb 9.6 oz (86.456 kg)  02/20/15 193 lb (87.544 kg)   General appearance: alert, cooperative and no distress Neurologic: intact Heart: regular rate and rhythm, S1, S2 normal, no murmur, click, rub or gallop and normal apical impulse Lungs: clear to auscultation bilaterally and normal percussion bilaterally Abdomen: soft, non-tender; bowel sounds normal; no masses,  no organomegaly Extremities: extremities normal, atraumatic, no cyanosis or edema and Homans sign is negative, no sign of DVT Wound: The right chest wall incisions are all well-healed Patient has no cervical or supraclavicular adenopathy, no carotid bruits no stridor on exam  Diagnostic Studies & Laboratory data:         Recent Radiology Findings:  Ct Chest W Contrast  07/22/2015   CLINICAL DATA:  Subsequent evaluation of a 80year old male with history of lung cancer diagnosed in March 2014 status post prior lung surgery as well as chemotherapy and radiation therapy now complete.  EXAM: CT CHEST WITH CONTRAST  TECHNIQUE: Multidetector CT imaging of the chest was performed during intravenous contrast administration.  CONTRAST:  71mOMNIPAQUE IOHEXOL 300 MG/ML  SOLN  COMPARISON:  Chest CT 01/14/2015.  FINDINGS: Mediastinum/Lymph Nodes: Heart size is normal. There is no significant pericardial fluid, thickening or pericardial calcification. No pathologically enlarged mediastinal or hilar lymph nodes. Esophagus is unremarkable in appearance. No axillary lymphadenopathy.  Lungs/Pleura: Status post right upper lobectomy. Extensive architectural distortion noted in the paramediastinal aspect of the remaining portions of the right middle and lower lobes, with a few patchy areas of surrounding  ground-glass attenuation, similar to prior studies, most compatible with chronic postradiation changes. There is again a 10 x 5 mm area of nodular thickening of an accessory fissure in the right lower lobe posteriorly (image 37 of series 5), which is stable in appearance compared to prior study 07/11/2014, favored to represent an area of chronic scarring. No other suspicious appearing pulmonary nodules or masses are identified on today's examination. No acute consolidative airspace disease. No pleural effusions.  Upper Abdomen: Exophytic incompletely visualized intermediate attenuation (29 HU) lesion in the upper pole of the left kidney is incompletely characterize, but is similar to prior studies, favored to represent a proteinaceous cyst.  Musculoskeletal/Soft Tissues: There are no aggressive appearing lytic or blastic lesions noted in the visualized portions of the skeleton.  IMPRESSION: 1. Stable examination demonstrating postoperative and postradiation changes in the  right hemithorax, as above. No definite findings to suggest residual/ recurrent disease or metastatic disease in the thorax. 2. Additional incidental findings, as above.   Electronically Signed   By: Vinnie Langton M.D.   On: 07/22/2015 15:44   Ct Chest W Contrast  01/14/2015   CLINICAL DATA:  80 year old male with history of right upper lobe lung cancer diagnosed in March 2014 status right upper lobectomy and status post chemotherapy and radiation therapy, completed in October 2014.  EXAM: CT CHEST WITH CONTRAST  TECHNIQUE: Multidetector CT imaging of the chest was performed during intravenous contrast administration.  CONTRAST:  27m OMNIPAQUE IOHEXOL 300 MG/ML  SOLN  COMPARISON:  Chest CT 07/11/2014.  FINDINGS: Mediastinum/Lymph Nodes: Heart size is normal. There is no significant pericardial fluid, thickening or pericardial calcification. No pathologically enlarged mediastinal or hilar lymph nodes. Esophagus is unremarkable in appearance.  No axillary lymphadenopathy.  Lungs/Pleura: Status post right upper lobectomy. Chronic postradiation changes are noted in the perihilar aspect of the remaining right middle and lower lobes, similar to prior examinations. There continues to be an ovoid shaped 10 x 5 mm nodule in the posterior aspect of the right lower lobe, which is associated with the inferior pulmonary ligament, and is similar in appearance to prior examinations dating back to 04/03/2014, favored to be a reactive subpleural lymph node. No other suspicious appearing pulmonary nodules or masses are otherwise noted. No acute consolidative airspace disease. No pleural effusions.  Upper Abdomen: 2.1 cm exophytic intermediate attenuation (22 HU) lesion in the upper pole of the left kidney is similar in size to numerous prior examinations dating back to 01/25/2013, presumably a proteinaceous cyst.  Musculoskeletal/Soft Tissues: There are no aggressive appearing lytic or blastic lesions noted in the visualized portions of the skeleton.  IMPRESSION: 1. Today's examination demonstrates stable post treatment related changes, as above, without definite findings to suggest recurrent disease in the thorax. 2. Specifically, and 10 x 5 mm ovoid shaped subpleural nodule in the posterior aspect of the right lower lobe is unchanged, favored to represent a subpleural lymph node.   Electronically Signed   By: DVinnie LangtonM.D.   On: 01/14/2015 16:00   I have independently reviewed the above radiology studies  and reviewed the findings with the patient.    Ct Chest W Contrast  07/11/2014   CLINICAL DATA:  Restaging lung cancer  EXAM: CT CHEST WITH CONTRAST  TECHNIQUE: Multidetector CT imaging of the chest was performed during intravenous contrast administration.  CONTRAST:  840mOMNIPAQUE IOHEXOL 300 MG/ML  SOLN  COMPARISON:  04/03/2014  FINDINGS: The heart size appears within normal limits. There is no pericardial effusion identified. The trachea appears  patent. The esophagus is on unremarkable. No enlarged mediastinal or hilar lymph nodes identified. There is no axillary or supraclavicular adenopathy.  Small right pleural effusion identified. Radiation change is identified within the paramediastinal right lung. 10 mm nodule in the posterior right lung base is stable from previous exam. No new pulmonary nodules identified.  Review of the visualized osseous structures is negative for aggressive lytic or sclerotic bone lesion. There is mild multi level spondylosis within the thoracic spine.  IMPRESSION: 1. No acute findings. 2. Stable appearance of postsurgical and radiation changes in the right lung. No specific features identified to suggest disease recurrence or metastatic disease. 3. Stable 10 mm nodule in the right lung.   Electronically Signed   By: TaKerby Moors.D.   On: 07/11/2014 15:25  Ct Chest W Contrast  04/03/2014   CLINICAL DATA:  Lung cancer. Partial resection. Status post radiation therapy.  EXAM: CT CHEST WITH CONTRAST  TECHNIQUE: Multidetector CT imaging of the chest was performed during intravenous contrast administration.  CONTRAST:  99m OMNIPAQUE IOHEXOL 300 MG/ML  SOLN  COMPARISON:  01/01/2014.  09/07/2013.  FINDINGS: Soft tissue / Mediastinum: There is no axillary lymphadenopathy. No mediastinal or hilar lymphadenopathy. Surgical changes and post radiation fibrosis noted in the region of the right hilum, as before. Heart size is normal. No pericardial effusion.  Lungs / Pleura: Postradiation fibrosis in the medial right lung is stable in appearance. 10 mm nodule in the posterior right lung base is more can flow within on the previous study and probably reflects atelectasis. No pulmonary nodule in the left lung. No focal airspace consolidation or pulmonary edema. Small right pleural effusion.  Bones: Bone windows reveal no worrisome lytic or sclerotic osseous lesions.  Upper Abdomen:  Left upper pole renal cyst is unchanged.  IMPRESSION:  Stable appearance of postsurgical and radiation change in the medial right upper lung. No definite disease recurrence.  10 mm nodule in the posterior right lung was not in area of atelectasis on the previous study. This probably reflects coalescence of the atelectatic changes and attention to this area on followup is recommended.   Electronically Signed   By: EMisty StanleyM.D.   On: 04/03/2014 13:33  Ct Chest W Contrast  01/01/2014   CLINICAL DATA:  Lung cancer restaging  EXAM: CT CHEST WITH CONTRAST  TECHNIQUE: Multidetector CT imaging of the chest was performed during intravenous contrast administration.  CONTRAST:  80 cc of Omni  COMPARISON:  09/07/2013  FINDINGS: The heart size appears normal. No pericardial effusion. There is no mediastinal or hilar adenopathy. No axillary or supraclavicular adenopathy noted.  Small right pleural effusion is identified and is new from previous exam. Postoperative change and volume loss is identified involving the right hemi thorax. There are progressive radiation changes noted within the right lung. No specific features are identified to suggest local tumor recurrence.  No axillary or supraclavicular adenopathy. Related through the upper abdomen is on unremarkable. There are no acute findings identified. The adrenal glands both appear normal. Stable cyst within the upper pole of the left kidney measuring 2.4 cm, image 64/series 2.  Review of the visualized osseous structures is significant for mild scoliosis and multilevel spondylosis involving the thoracic spine.  IMPRESSION: 1. Progressive changes of external beam radiation are identified within the right lung. 2. No evidence for residual or recurrence of tumor.   Electronically Signed   By: TKerby MoorsM.D.   On: 01/01/2014 14:04   CLINICAL DATA: Status post lung surgery, cough shortness of breath,  hemoptysis  EXAM:  CT ANGIOGRAPHY CHEST WITH CONTRAST  TECHNIQUE:  Multidetector CT imaging of the chest was  performed using the  standard protocol during bolus administration of intravenous  contrast. Multiplanar CT image reconstructions including MIPs were  obtained to evaluate the vascular anatomy.  CONTRAST: 1036mOMNIPAQUE IOHEXOL 350 MG/ML SOLN  COMPARISON: 06/27/2013  FINDINGS:  The thoracic inlet is unremarkable. The mediastinum demonstrates  linearly oriented increased soft tissue density within the anterior  superior right hilar region extending posteriorly. Linear areas of  high density are appreciated within these findings. Considering the  patient's history these findings abdominal wall appearance of  postsurgical changes. It appears stable when compared to the  previous study. Residual or recurrent disease cannot be excluded.  The There is no  evidence of filling defects within the main, lobar,  or segmental pulmonary arteries.  The lung parenchyma demonstrates diffuse airspace disease in the  posterior aspect of the right upper lobe, superior segment right  lower lobe. Mild increased density projects in the the base of the  left lower lobe.  Visualized upper abdominal viscera grossly unremarkable. Multilevel  degenerative changes are appreciated within the thoracic spine.  Review of the MIP images confirms the above findings.  IMPRESSION:  1. No CT evidence of pulmonary arterial embolic disease  2. Diffuse areas of pneumonitis within the superior segment right  lower lobe and to a lesser extent at the posterior aspect of the  right upper lobe differential considerations are postsurgical  changes versus an infectious or inflammatory pneumonitis. Pulmonary  hemorrhage considering the patient's history is a diagnostic  consideration. Asymmetric edema is of much lower consideration  considering the findings in the remaining portions of the lung.  Electronically Signed  By: Margaree Mackintosh M.D.  On: 09/07/2013 14:26   Ct Chest W Contrast  06/27/2013   *RADIOLOGY REPORT*  Clinical  Data: Staging lung cancer  CT CHEST WITH CONTRAST  Technique:  Multidetector CT imaging of the chest was performed following the standard protocol during bolus administration of intravenous contrast.  Contrast: 41m OMNIPAQUE IOHEXOL 300 MG/ML  SOLN  Comparison: 02/14/2013  Findings: No pleural effusion identified.  The patient is status post right upper lobectomy. There is a new sub solid density within the right lung base measuring 1.1 x 1.8 cm, image 41/series 6. This is new from the previous exam.  Within the left lower lobe there is a 5 mm nodule, image 46/series 5.  Heart size appears normal.  There is no mediastinal or hilar adenopathy identified.  There is no enlarged axillary or supraclavicular adenopathy.  Limited imaging through the upper abdomen shows bilateral renal cysts.  No acute findings identified.  No mass or adenopathy.  Review of the visualized bony structures is significant for mild scoliosis and multilevel spondylosis.  IMPRESSION:  1.  No acute cardiopulmonary abnormalities. 2.  Postoperative changes from right upper lobectomy. 3.  New sub solid density within the right lower lobe is nonspecific and may be treatment related.  Recommend follow-up imaging in 3 months to confirm persistence.   Original Report Authenticated By: TKerby Moors M.D.    Recent Labs: Lab Results  Component Value Date   WBC 7.7 01/14/2015   HGB 15.9 01/14/2015   HCT 47.1 01/14/2015   PLT 180 01/14/2015   GLUCOSE 96 07/22/2015   CHOL 206* 06/19/2014   TRIG 216.0* 06/19/2014   HDL 38.50* 06/19/2014   LDLDIRECT 144.0 06/19/2014   ALT 25 07/22/2015   AST 20 07/22/2015   NA 142 07/22/2015   K 4.3 07/22/2015   CL 100 09/07/2013   CREATININE 1.1 07/22/2015   BUN 14.9 07/22/2015   CO2 29 07/22/2015   INR 0.99 02/20/2013   PATH: stage IIIA,  EGFR: Exon 19 deletion  Mutation detected Diagnosis 1. Lung, resection (segmental or lobe), Right upper lobe - ADENOCARCINOMA, 3.4 CM. - MARGINS NOT  INVOLVED. - VISCERAL PLEURA FREE OF TUMOR. - ONE BENIGN LYMPH NODE. 2. Lymph node, biopsy, Right upper 4 R - METASTATIC ADENOCARCINOMA. 3. Lymph node, biopsy, Right 2 R - ANTHRACOTIC LYMPH NODE. NO TUMOR IDENTIFIED. Microscopic Comment 1. LUNG Specimen, including laterality: Right lung lobe. Procedure: Lobectomy. Specimen integrity (intact/disrupted): Intact. Tumor site: Right upper lobe. Tumor focality: Unifocal Maximum tumor size (cm): 3.4 cm. Histologic  type: Adenocarcinoma, bronchioalveolar cell type. Grade: I. Margins: Free of tumor. Visceral pleura invasion: No. Tumor extension: Within lobe. Treatment effect (if treated with neoadjuvant therapy): No. Lymph -Vascular invasion: Present. Lymph nodes: Number examined - 3 ; Number N1 nodes positive - 0 ; Number N2 nodes positive - 1 TNM code: pT2a , pN2, pMX Ancillary studies: EGFR and ALK pending. Non-neoplastic lung: Unremarkable     Assessment / Plan:    Stable following resection of right upper lobe, lung cancer which was found to be EGFR positive adenocarcinoma stage IIIa. Now 30 months postop. Plan see the patient back in 6 months after his next CT scan.   Grace Isaac 07/24/2015 4:35 PM

## 2015-07-29 ENCOUNTER — Encounter: Payer: Self-pay | Admitting: *Deleted

## 2015-07-29 ENCOUNTER — Ambulatory Visit (HOSPITAL_BASED_OUTPATIENT_CLINIC_OR_DEPARTMENT_OTHER): Payer: Federal, State, Local not specified - PPO | Admitting: Internal Medicine

## 2015-07-29 ENCOUNTER — Telehealth: Payer: Self-pay | Admitting: Internal Medicine

## 2015-07-29 ENCOUNTER — Encounter: Payer: Self-pay | Admitting: Internal Medicine

## 2015-07-29 VITALS — BP 119/72 | HR 89 | Temp 98.7°F | Resp 18 | Ht 71.0 in | Wt 188.7 lb

## 2015-07-29 DIAGNOSIS — C3411 Malignant neoplasm of upper lobe, right bronchus or lung: Secondary | ICD-10-CM

## 2015-07-29 NOTE — Progress Notes (Signed)
[place] [phone]   [phone]  OFFICE PROGRESS NOTE  Jerlyn Ly, MD [place] [address]  DIAGNOSIS AND STAGE: Stage III A (T2 A., N2, M0) non-small cell lung cancer, adenocarcinoma with positive EGFR mutation in exon 19 and negative ALK gene translocation diagnosed in April 2014   PRIOR THERAPY:  1) Status post right upper lobectomy with lymph node dissection under the care of Dr. Servando Snare on 02/21/2013. 2) Systemic adjuvant chemotherapy with carboplatin for AUC of 5 and Alimta 500 mg/M2 every 3 weeks. Status post 4 cycles. Last cycle was given on 05/29/2013. (Carboplatin was used in place of cisplatin secondary to concern about intolerability and significant adverse effects of cisplatin).  3) Adjuvant radiotherapy to the mediastinum under the care of Dr. Lisbeth Renshaw completed 08/27/2013.   CURRENT THERAPY: Observation  CHEMOTHERAPY INTENT: adjuvant/curative  CURRENT # OF CHEMOTHERAPY CYCLES: 0 CURRENT ANTIEMETICS: Zofran, Decadron and Compazine  CURRENT SMOKING STATUS: currently a nonsmoker  ORAL CHEMOTHERAPY AND CONSENT: None  CURRENT BISPHOSPHONATES USE: None  PAIN MANAGEMENT: no pain  NARCOTICS INDUCED CONSTIPATION: N/A  LIVING WILL AND CODE STATUS: Full Code.   INTERVAL HISTORY: WISDOM RICKEY 80 y.o. male returns to the clinic today for 6 months followup visit followup visit. Mr. Brummitt has no complaints today.  He was treated recently with methotrexate for rheumatoid arthritis but this was discontinued 2 weeks ago secondary to entrance.  His  Serum bilirubin is elevated on the recent blood work likely due to his previous treatment with methotrexate. He denied having any significant fever or chills. He has no nausea or vomiting. He denied having any significant chest pain, shortness of breath, cough or hemoptysis. He had repeat CT scan of the chest performed recently and he is here for evaluation and discussion of his  scan results.  MEDICAL HISTORY: Past Medical History  Diagnosis Date  . BPH (benign prostatic hyperplasia)   . Hypogonadism male   . Bipolar 1 disorder   . ED (erectile dysfunction)   . Hyperlipidemia   . Neuromuscular disorder     peripheral neuropathy  . Cataract 2010    Bilateral  . Laryngopharyngeal reflux   . Gilbert's syndrome   . Deviated septum     TO THE LEFT  . Morton's neuroma     LEFT FOOT  . Lung mass 02/07/13    RIGHT UPPER LOBE  . Shortness of breath     voice breaks, ? related to reflux, although told by Dr. Ardis Hughs, S.- early 54 yr. old  . GERD (gastroesophageal reflux disease)   . Arthritis     psoriatic arthritis, ? , treated /w mmethotrexate   . Finger injury     mallet finger-- 02/13/2013, cast in place, followed by dr. Fredna Dow  . Allergy   . Depression   . History of radiation therapy 07/24/13-08/27/13    50Gy/25 fx chest  . lung ca dx'd 12/2012    rul  . Chest pain     ALLERGIES:  is allergic to codeine; decadron; other; statins; zetia; and zoloft.  MEDICATIONS:  Current Outpatient Prescriptions  Medication Sig Dispense Refill  . aspirin EC 81 MG tablet Take 81 mg by mouth every morning.     . Calcium Carbonate-Vitamin D (CALTRATE 600+D) 600-400 MG-UNIT per tablet Take 1 tablet by mouth every morning.    . Cholecalciferol (VITAMIN D-3) 1000 UNITS CAPS Take 1,000 Units by mouth every morning.     Marland Kitchen  clobetasol cream (TEMOVATE) 5.28 % Apply 1 application topically daily.     . clonazePAM (KLONOPIN) 0.5 MG tablet Take 0.5 mg by mouth at bedtime.    . cyanocobalamin 1000 MCG tablet Take 1,000 mcg by mouth every morning.     Marland Kitchen esomeprazole (NEXIUM) 40 MG capsule Take 40 mg by mouth daily before breakfast.    . finasteride (PROSCAR) 5 MG tablet Take 5 mg by mouth daily before breakfast.     . gabapentin (NEURONTIN) 300 MG capsule Take 600-900 mg by mouth 2 (two) times daily. He takes two capsules every morning and three capsules at bedtime.    Marland Kitchen lithium  carbonate (LITHOBID) 300 MG CR tablet Take 600 mg by mouth at bedtime.     . Multiple Vitamin (MULTIVITAMIN WITH MINERALS) TABS tablet Take 1 tablet by mouth every morning.    Marland Kitchen OVER THE COUNTER MEDICATION Take 1 tablet by mouth 2 (two) times daily. Glucosamine Chondroitin 1541m/1200mg    . pimecrolimus (ELIDEL) 1 % cream Apply 1 application topically 2 (two) times daily.    . Probiotic Product (PROBIOTIC DAILY PO) Take 1 each by mouth daily.    . propranolol (INDERAL) 10 MG tablet Take 10-20 mg by mouth daily as needed (For extremely stressful circumstances.).     .Marland Kitchentamsulosin (FLOMAX) 0.4 MG CAPS capsule Take 0.4 mg by mouth daily.  3  . testosterone cypionate (DEPOTESTOTERONE CYPIONATE) 200 MG/ML injection Inject 0.5 mLs into the muscle once a week.  1  . vardenafil (LEVITRA) 5 MG tablet Take 5 mg by mouth daily as needed for erectile dysfunction.      No current facility-administered medications for this visit.    SURGICAL HISTORY:  Past Surgical History  Procedure Laterality Date  . Prostate surgery  1996    TURP  . Knee arthroscopy Right 01/2007  . Pilonidal cyst excision  1960's  . Eye surgery      cataracts removed fr. both eyes, IOL in place   . Video assisted thoracoscopy (vats)/wedge resection Right 02/21/2013    Procedure: Right VIDEO ASSISTED THORACOSCOPY ,Thoracotomy with right upper lobectomy, node sampling ;  Surgeon: EGrace Isaac MD;  [facility]  Service: Thoracic;  Laterality: Right;  . Video bronchoscopy N/A 02/21/2013    Procedure: VIDEO BRONCHOSCOPY;  Surgeon: EGrace Isaac MD;  [facility]  Service: Thoracic;  Laterality: N/A;  . Cystoscopy N/A 02/21/2013    Procedure: CYSTOSCOPY FLEXIBLE with insertion of foley catheter;  Surgeon: MFredricka Bonine MD;  [facility]  Service: Urology;  Laterality: N/A;    REVIEW OF SYSTEMS:  Constitutional: negative Eyes: negative Ears, nose, mouth, throat, and face: negative Respiratory:  negative Cardiovascular: negative Gastrointestinal: negative Genitourinary:negative Integument/breast: negative Hematologic/lymphatic: negative Musculoskeletal:negative Neurological: negative Behavioral/Psych: negative Endocrine: negative Allergic/Immunologic: negative   PHYSICAL EXAMINATION: General appearance: alert, cooperative and no distress Head: Normocephalic, without obvious abnormality, atraumatic Neck: no adenopathy Lymph nodes: Cervical, supraclavicular, and axillary nodes normal. Resp: clear to auscultation bilaterally Cardio: regular rate and rhythm, S1, S2 normal, no murmur, click, rub or gallop GI: soft, non-tender; bowel sounds normal; no masses,  no organomegaly Extremities: extremities normal, atraumatic, no cyanosis or edema Neurologic: Alert and oriented X 3, normal strength and tone. Normal symmetric reflexes. Normal coordination and gait  ECOG PERFORMANCE STATUS: 0 - Asymptomatic  Blood pressure 119/72, pulse 89, temperature 98.7 F (37.1 C), temperature source Oral, resp. rate 18, height '5\' 11"'  (1.803 m), weight 188 lb 11.2 oz (85.594 kg),  SpO2 97 %.  LABORATORY DATA: Lab Results  Component Value Date   WBC 7.7 01/14/2015   HGB 15.9 01/14/2015   HCT 47.1 01/14/2015   MCV 88.0 01/14/2015   PLT 180 01/14/2015      Chemistry      Component Value Date/Time   NA 142 07/22/2015 1009   NA 135 09/07/2013 1145   K 4.3 07/22/2015 1009   K 5.0 09/07/2013 1145   CL 100 09/07/2013 1145   CL 107 04/24/2013 1110   CO2 29 07/22/2015 1009   CO2 23 09/07/2013 1145   BUN 14.9 07/22/2015 1009   BUN 15 09/07/2013 1145   CREATININE 1.1 07/22/2015 1009   CREATININE 0.80 09/07/2013 1145      Component Value Date/Time   CALCIUM 9.2 07/22/2015 1009   CALCIUM 9.7 09/07/2013 1145   ALKPHOS 50 07/22/2015 1009   ALKPHOS 45 06/19/2014 0900   AST 20 07/22/2015 1009   AST 19 06/19/2014 0900   ALT 25 07/22/2015 1009   ALT 19 06/19/2014 0900   BILITOT 1.44*  07/22/2015 1009   BILITOT 1.2 06/19/2014 0900       RADIOGRAPHIC STUDIES: Ct Chest W Contrast  07/22/2015   CLINICAL DATA:  Subsequent evaluation of a 80 year old male with history of lung cancer diagnosed in March 2014 status post prior lung surgery as well as chemotherapy and radiation therapy now complete.  EXAM: CT CHEST WITH CONTRAST  TECHNIQUE: Multidetector CT imaging of the chest was performed during intravenous contrast administration.  CONTRAST:  26m OMNIPAQUE IOHEXOL 300 MG/ML  SOLN  COMPARISON:  Chest CT 01/14/2015.  FINDINGS: Mediastinum/Lymph Nodes: Heart size is normal. There is no significant pericardial fluid, thickening or pericardial calcification. No pathologically enlarged mediastinal or hilar lymph nodes. Esophagus is unremarkable in appearance. No axillary lymphadenopathy.  Lungs/Pleura: Status post right upper lobectomy. Extensive architectural distortion noted in the paramediastinal aspect of the remaining portions of the right middle and lower lobes, with a few patchy areas of surrounding ground-glass attenuation, similar to prior studies, most compatible with chronic postradiation changes. There is again a 10 x 5 mm area of nodular thickening of an accessory fissure in the right lower lobe posteriorly (image 37 of series 5), which is stable in appearance compared to prior study 07/11/2014, favored to represent an area of chronic scarring. No other suspicious appearing pulmonary nodules or masses are identified on today's examination. No acute consolidative airspace disease. No pleural effusions.  Upper Abdomen: Exophytic incompletely visualized intermediate attenuation (29 HU) lesion in the upper pole of the left kidney is incompletely characterize, but is similar to prior studies, favored to represent a proteinaceous cyst.  Musculoskeletal/Soft Tissues: There are no aggressive appearing lytic or blastic lesions noted in the visualized portions of the skeleton.  IMPRESSION: 1.  Stable examination demonstrating postoperative and postradiation changes in the right hemithorax, as above. No definite findings to suggest residual/ recurrent disease or metastatic disease in the thorax. 2. Additional incidental findings, as above.   Electronically Signed   By: DVinnie LangtonM.D.   On: 07/22/2015 15:44   ASSESSMENT AND PLAN: This is a very pleasant 80years old white male with history of stage IIIa non-small cell lung cancer, adenocarcinoma with positive EGFR mutation status post right upper lobectomy with lymph node dissection followed by 4 cycles of adjuvant chemotherapy. His also status post adjuvant radiotherapy to the mediastinum. The patient has been on observation since October 2014 with no significant evidence for disease recurrence. The recent  CT scan of the chest showed no concerning findings for disease recurrence. I discussed the scan results with the patient today and give him a copy of his report. I recommended for him to continue on observation with repeat CT scan of the chest in 6 months. For the elevated serum bilirubin, this is most likely secondary to previous treatment with methotrexate. The patient will continue routine follow-up visit and monitoring by his primary care physician and dermatologist. The patient was advised to call immediately if he has any concerning symptoms in the interval.  The patient voices understanding of current disease status and treatment options and is in agreement with the current care plan.  All questions were answered. The patient knows to call the clinic with any problems, questions or concerns. We can certainly see the patient much sooner if necessary.  Disclaimer: This note was dictated with voice recognition software. Similar sounding words can inadvertently be transcribed and may not be corrected upon review.

## 2015-07-29 NOTE — Telephone Encounter (Signed)
Gave and printed appt sched and avs for pt for March 2017 °

## 2015-07-29 NOTE — Progress Notes (Signed)
Oncology Nurse Navigator Documentation  Oncology Nurse Navigator Flowsheets 07/29/2015  Navigator Encounter Type Other/Spoke with patient today at [facility].  He is doing well. Follow up scan without disease.  Patient is very please with results.  Questions and concerns addressed.   Patient Visit Type Follow-up;Medonc  Treatment Phase Other  Barriers/Navigation Needs No barriers at this time  Time Spent with Patient 15

## 2015-08-07 ENCOUNTER — Telehealth: Payer: Self-pay

## 2015-08-07 NOTE — Telephone Encounter (Signed)
Pt dropped off appeal paperwork he sent in for Repatha medication.  Pt to be reviewed with Dr. Gwenlyn Found and Erasmo Downer, PhamD, will be scanned into chart for future reference.

## 2015-09-29 IMAGING — CT CT CHEST W/ CM
2 of 3 series · 15 of 36 positions shown, 18 images · IV contrast (OMNIPAQUE)
Comparison: Chest CT 07/11/2014.

CLINICAL DATA: 79-year-old male with history of right upper lobe
lung cancer diagnosed in December 2012 status right upper lobectomy and
status post chemotherapy and radiation therapy, completed in August 2013.

EXAM:
CT CHEST WITH CONTRAST
TECHNIQUE: Multidetector CT imaging of the chest was performed during
intravenous contrast administration.
CONTRAST:  80mL OMNIPAQUE IOHEXOL 300 MG/ML  SOLN

[Series 2: chest with st · axial · 0.80mm/px · z∈[-303,-8]mm · 12 of 71 slices shown, 15 images]
[im 6/71  mediastinal]
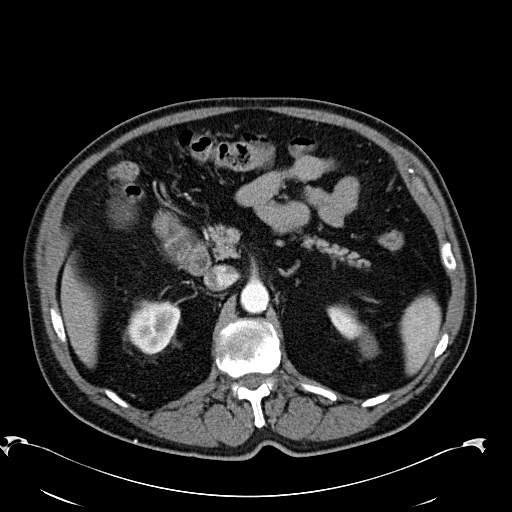
[im 6/71  lung]
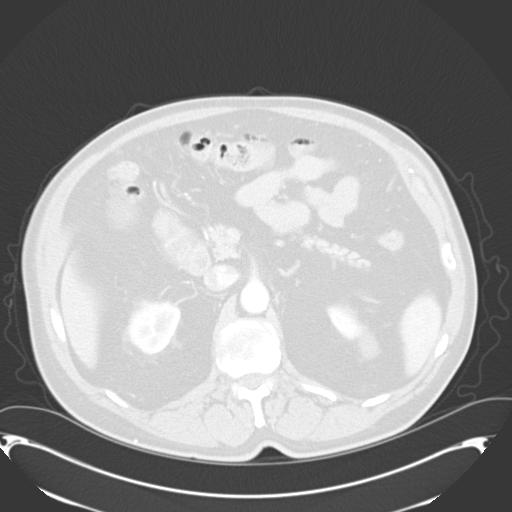
[im 11/71  lung]
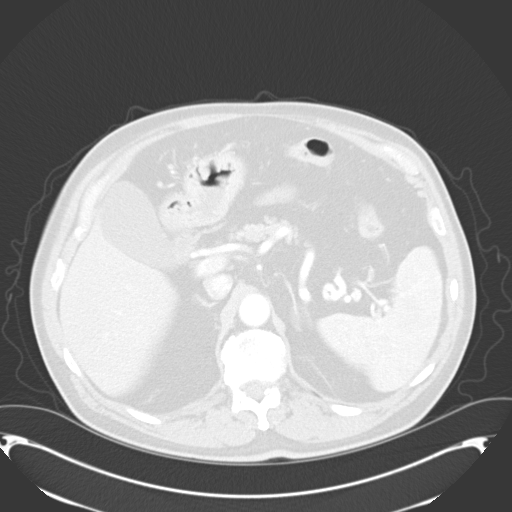
[im 16/71  lung]
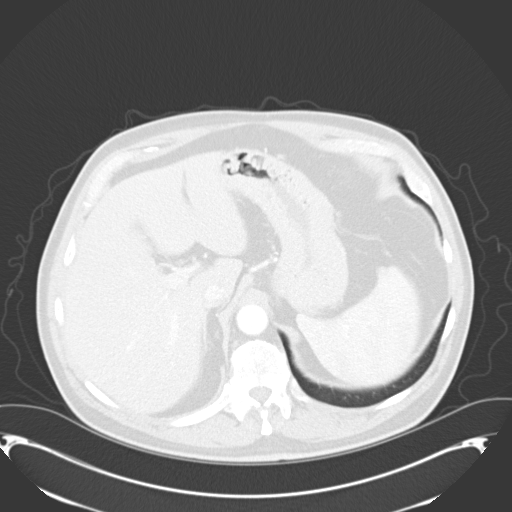
[im 21/71  lung]
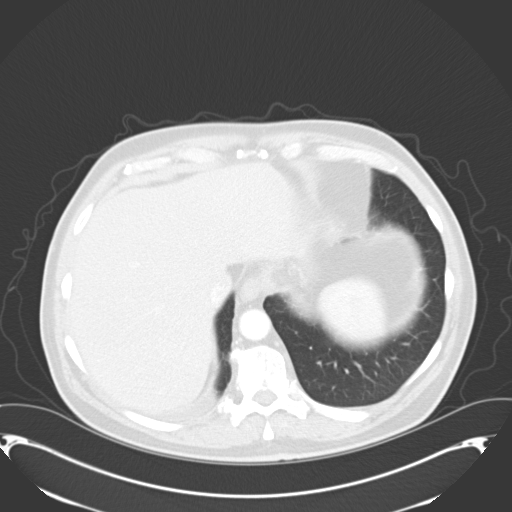
[im 26/71  mediastinal]
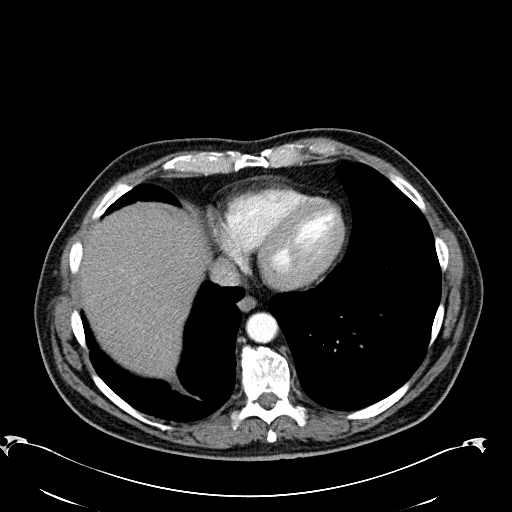
[im 26/71  lung]
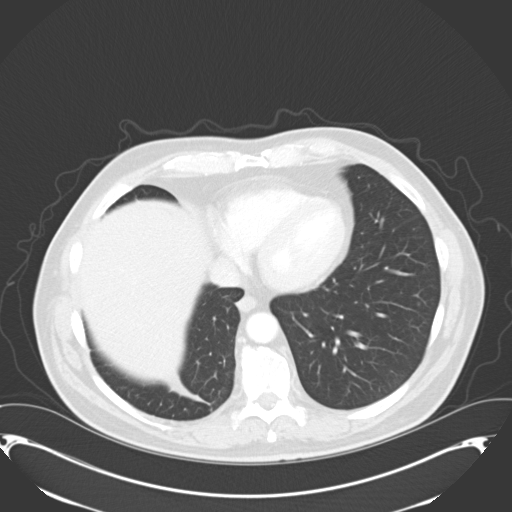
[im 32/71  lung]
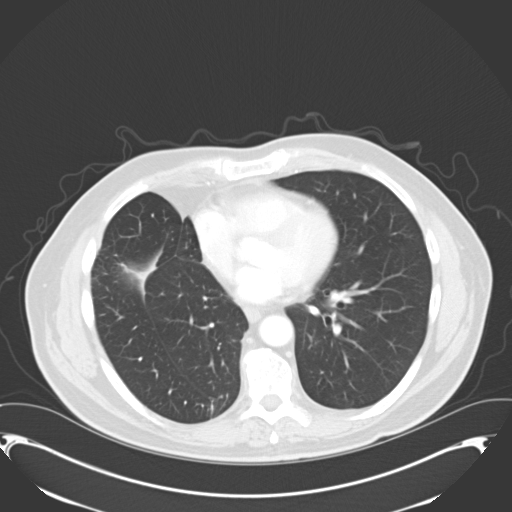
[im 39/71  lung]
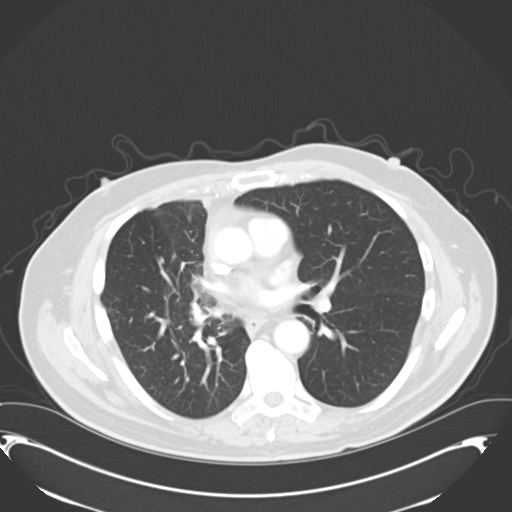
[im 45/71  lung]
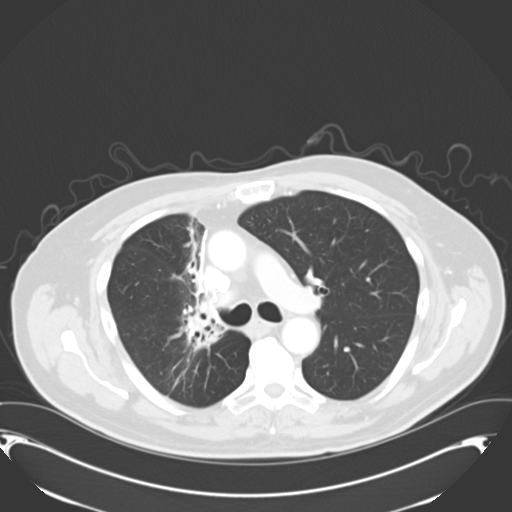
[im 50/71  mediastinal]
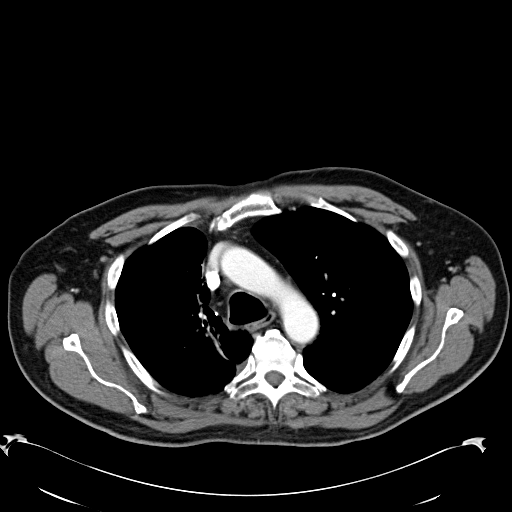
[im 50/71  lung]
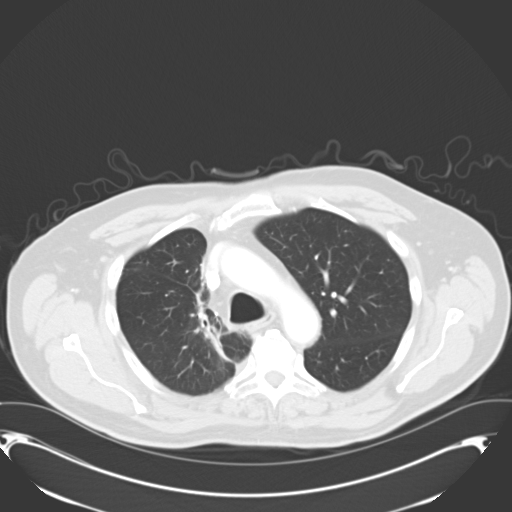
[im 55/71  lung]
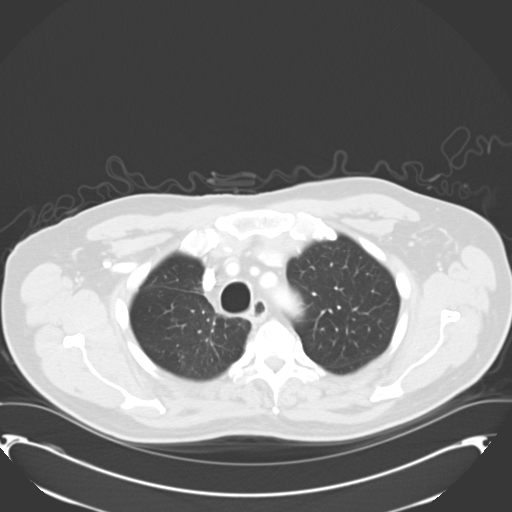
[im 60/71  lung]
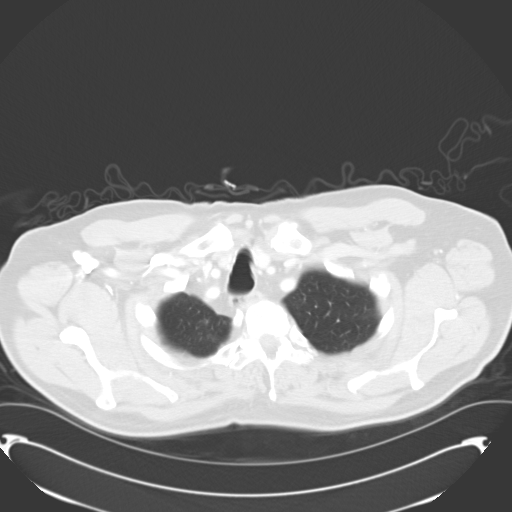
[im 65/71  lung]
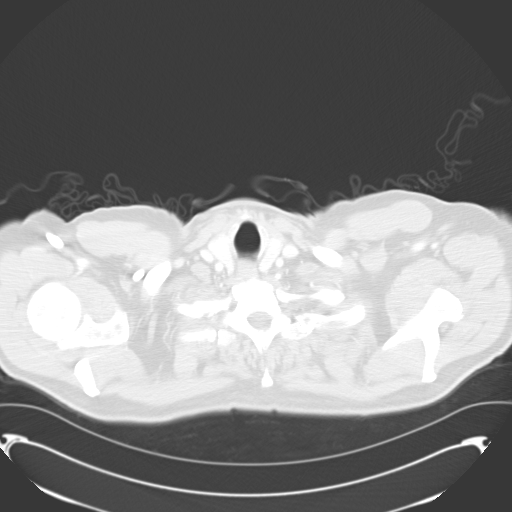

[Series 602: <mpr thick range> · coronal · 0.80mm/px · 3 of 96 slices shown]
[im 20/96  lung]
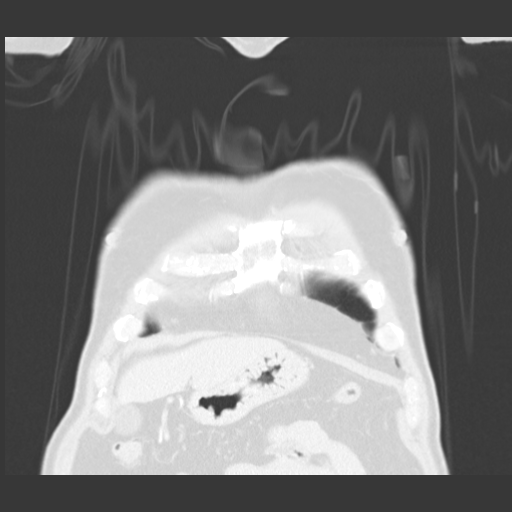
[im 39/96  lung]
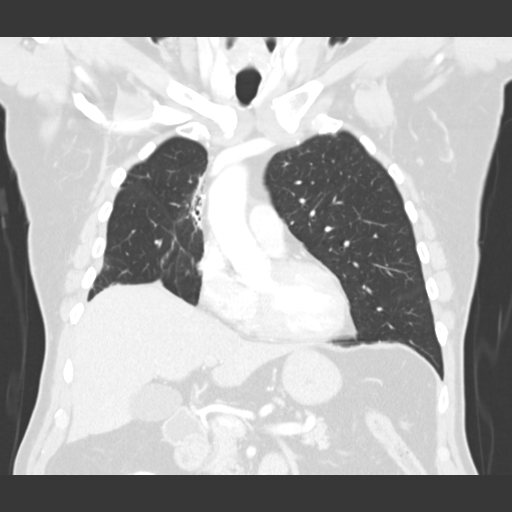
[im 58/96  lung]
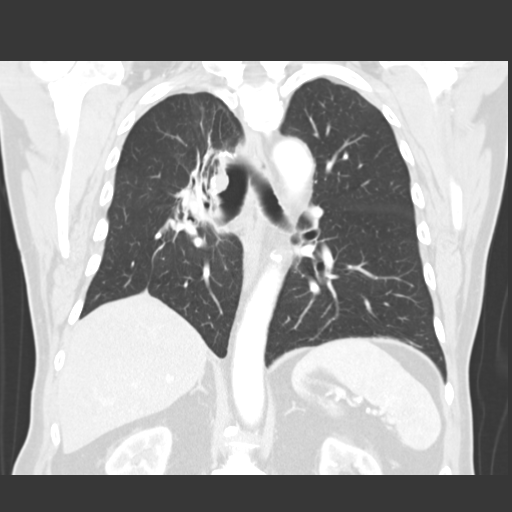

[15 of 36 positions shown; findings below may reference images not displayed]

FINDINGS: Mediastinum/Lymph Nodes: Heart size is normal. There is no
significant pericardial fluid, thickening or pericardial
calcification. No pathologically enlarged mediastinal or hilar lymph
nodes. Esophagus is unremarkable in appearance. No axillary
lymphadenopathy.

Lungs/Pleura: Status post right upper lobectomy. Chronic
postradiation changes are noted in the perihilar aspect of the
remaining right middle and lower lobes, similar to prior
examinations. There continues to be an ovoid shaped 10 x 5 mm nodule
in the posterior aspect of the right lower lobe, which is associated
with the inferior pulmonary ligament, and is similar in appearance
to prior examinations dating back to 04/03/2014, favored to be a
reactive subpleural lymph node. No other suspicious appearing
pulmonary nodules or masses are otherwise noted. No acute
consolidative airspace disease. No pleural effusions.

Upper Abdomen: 2.1 cm exophytic intermediate attenuation (22 HU)
lesion in the upper pole of the left kidney is similar in size to
numerous prior examinations dating back to 01/25/2013, presumably a
proteinaceous cyst.

Musculoskeletal/Soft Tissues: There are no aggressive appearing
lytic or blastic lesions noted in the visualized portions of the
skeleton.
IMPRESSION: 1. Today's examination demonstrates stable post treatment related
changes, as above, without definite findings to suggest recurrent
disease in the thorax.
2. Specifically, and 10 x 5 mm ovoid shaped subpleural nodule in the
posterior aspect of the right lower lobe is unchanged, favored to
represent a subpleural lymph node.

## 2015-12-04 ENCOUNTER — Encounter: Payer: Self-pay | Admitting: Internal Medicine

## 2015-12-04 ENCOUNTER — Other Ambulatory Visit: Payer: Self-pay | Admitting: Internal Medicine

## 2015-12-04 ENCOUNTER — Ambulatory Visit
Admission: RE | Admit: 2015-12-04 | Discharge: 2015-12-04 | Disposition: A | Discharge status: Home / Self Care | Payer: Federal, State, Local not specified - PPO | Source: Ambulatory Visit | Attending: Internal Medicine | Admitting: Internal Medicine

## 2015-12-04 DIAGNOSIS — M545 Low back pain: Secondary | ICD-10-CM

## 2016-01-02 ENCOUNTER — Telehealth: Payer: Self-pay | Admitting: *Deleted

## 2016-01-02 NOTE — Telephone Encounter (Signed)
Oncology Nurse Navigator Documentation  Oncology Nurse Navigator Flowsheets 01/02/2016  Navigator Encounter Type Telephone/Mr. Bistline called and would like his CT scan scheduled.  I called back and update I have notified scheduling to call with appt.   Telephone Outgoing Call  Barriers/Navigation Needs Coordination of Care  Acuity Level 1  Time Spent with Patient 30

## 2016-01-06 ENCOUNTER — Encounter: Payer: Self-pay | Admitting: Internal Medicine

## 2016-01-07 ENCOUNTER — Telehealth: Payer: Self-pay | Admitting: *Deleted

## 2016-01-07 NOTE — Telephone Encounter (Signed)
Oncology Nurse Navigator Documentation  Oncology Nurse Navigator Flowsheets 01/07/2016  Navigator Encounter Type Telephone  Telephone Outgoing Call/patient called and left me a vm message.  He was concerned that his CT Chest scan was not scheduled.  I called central scheduling.  I was told his CT order "fell off Q".  I received a time and date.  I called patient and spoke with wife.  I gave her appt time and place  Barriers/Navigation Needs Coordination of Care  Interventions Coordination of Care  Coordination of Care Appts  Acuity Level 2  Time Spent with Patient 30

## 2016-01-20 ENCOUNTER — Other Ambulatory Visit (HOSPITAL_BASED_OUTPATIENT_CLINIC_OR_DEPARTMENT_OTHER): Payer: Federal, State, Local not specified - PPO

## 2016-01-20 DIAGNOSIS — C3411 Malignant neoplasm of upper lobe, right bronchus or lung: Secondary | ICD-10-CM | POA: Diagnosis not present

## 2016-01-20 LAB — COMPREHENSIVE METABOLIC PANEL
ALT: 18 U/L (ref 0–55)
ANION GAP: 7 meq/L (ref 3–11)
AST: 14 U/L (ref 5–34)
Albumin: 3.8 g/dL (ref 3.5–5.0)
Alkaline Phosphatase: 51 U/L (ref 40–150)
BUN: 13.8 mg/dL (ref 7.0–26.0)
CHLORIDE: 107 meq/L (ref 98–109)
CO2: 26 meq/L (ref 22–29)
Calcium: 9 mg/dL (ref 8.4–10.4)
Creatinine: 1 mg/dL (ref 0.7–1.3)
EGFR: 76 mL/min/{1.73_m2} — AB (ref >90)
Glucose: 91 mg/dl (ref 70–140)
POTASSIUM: 4.2 meq/L (ref 3.5–5.1)
Sodium: 141 mEq/L (ref 136–145)
Total Bilirubin: 1.12 mg/dL (ref 0.20–1.20)
Total Protein: 6.8 g/dL (ref 6.4–8.3)

## 2016-01-20 LAB — CBC WITH DIFFERENTIAL/PLATELET
BASO%: 0.8 % (ref 0.0–2.0)
BASOS ABS: 0.1 10*3/uL (ref 0.0–0.1)
EOS ABS: 0.3 10*3/uL (ref 0.0–0.5)
EOS%: 3.6 % (ref 0.0–7.0)
HCT: 47.2 % (ref 38.4–49.9)
HEMOGLOBIN: 15.5 g/dL (ref 13.0–17.1)
LYMPH%: 22.2 % (ref 14.0–49.0)
MCH: 28.3 pg (ref 27.2–33.4)
MCHC: 32.9 g/dL (ref 32.0–36.0)
MCV: 86 fL (ref 79.3–98.0)
MONO#: 0.6 10*3/uL (ref 0.1–0.9)
MONO%: 7.1 % (ref 0.0–14.0)
NEUT%: 66.3 % (ref 39.0–75.0)
NEUTROS ABS: 5.4 10*3/uL (ref 1.5–6.5)
PLATELETS: 191 10*3/uL (ref 140–400)
RBC: 5.49 10*6/uL (ref 4.20–5.82)
RDW: 15.1 % — AB (ref 11.0–14.6)
WBC: 8.1 10*3/uL (ref 4.0–10.3)
lymph#: 1.8 10*3/uL (ref 0.9–3.3)

## 2016-01-22 ENCOUNTER — Encounter: Payer: Federal, State, Local not specified - PPO | Admitting: Cardiothoracic Surgery

## 2016-01-26 ENCOUNTER — Encounter (HOSPITAL_COMMUNITY): Payer: Self-pay

## 2016-01-26 ENCOUNTER — Ambulatory Visit (HOSPITAL_COMMUNITY)
Admission: RE | Admit: 2016-01-26 | Discharge: 2016-01-26 | Disposition: A | Discharge status: Home / Self Care | Payer: Federal, State, Local not specified - PPO | Source: Ambulatory Visit | Attending: Internal Medicine | Admitting: Internal Medicine

## 2016-01-26 DIAGNOSIS — N289 Disorder of kidney and ureter, unspecified: Secondary | ICD-10-CM | POA: Diagnosis not present

## 2016-01-26 DIAGNOSIS — Z923 Personal history of irradiation: Secondary | ICD-10-CM | POA: Insufficient documentation

## 2016-01-26 DIAGNOSIS — Z902 Acquired absence of lung [part of]: Secondary | ICD-10-CM | POA: Insufficient documentation

## 2016-01-26 DIAGNOSIS — C3411 Malignant neoplasm of upper lobe, right bronchus or lung: Secondary | ICD-10-CM | POA: Insufficient documentation

## 2016-01-26 MED ORDER — IOPAMIDOL (ISOVUE-300) INJECTION 61%
75.0000 mL | Freq: Once | INTRAVENOUS | Status: AC | PRN
  Administered 2016-01-26: 75 mL via INTRAVENOUS

## 2016-01-27 ENCOUNTER — Ambulatory Visit (HOSPITAL_BASED_OUTPATIENT_CLINIC_OR_DEPARTMENT_OTHER): Payer: Medicare Other | Admitting: Internal Medicine

## 2016-01-27 ENCOUNTER — Telehealth: Payer: Self-pay | Admitting: Internal Medicine

## 2016-01-27 ENCOUNTER — Encounter: Payer: Self-pay | Admitting: Internal Medicine

## 2016-01-27 VITALS — BP 127/70 | HR 96 | Temp 98.3°F | Resp 18 | Ht 71.0 in | Wt 186.7 lb

## 2016-01-27 DIAGNOSIS — N281 Cyst of kidney, acquired: Secondary | ICD-10-CM | POA: Diagnosis not present

## 2016-01-27 DIAGNOSIS — C3411 Malignant neoplasm of upper lobe, right bronchus or lung: Secondary | ICD-10-CM

## 2016-01-27 DIAGNOSIS — Z85118 Personal history of other malignant neoplasm of bronchus and lung: Secondary | ICD-10-CM

## 2016-01-27 NOTE — Telephone Encounter (Signed)
Gave and printed appt sched and avs for pt for Sept °

## 2016-01-27 NOTE — Progress Notes (Signed)
[facility] [phone]   [phone]  OFFICE PROGRESS NOTE  Alvin Ly, MD [place] [address]  DIAGNOSIS AND STAGE: Stage III A (T2 A., N2, M0) non-small cell lung cancer, adenocarcinoma with positive EGFR mutation in exon 19 and negative ALK gene translocation diagnosed in April 2014   PRIOR THERAPY:  1) Status post right upper lobectomy with lymph node dissection under the care of Dr. Servando Snare on 02/21/2013. 2) Systemic adjuvant chemotherapy with carboplatin for AUC of 5 and Alimta 500 mg/M2 every 3 weeks. Status post 4 cycles. Last cycle was given on 05/29/2013. (Carboplatin was used in place of cisplatin secondary to concern about intolerability and significant adverse effects of cisplatin).  3) Adjuvant radiotherapy to the mediastinum under the care of Dr. Lisbeth Renshaw completed 08/27/2013.   CURRENT THERAPY: Observation  CHEMOTHERAPY INTENT: adjuvant/curative  CURRENT # OF CHEMOTHERAPY CYCLES: 0 CURRENT ANTIEMETICS: Zofran, Decadron and Compazine  CURRENT SMOKING STATUS: currently a nonsmoker  ORAL CHEMOTHERAPY AND CONSENT: None  CURRENT BISPHOSPHONATES USE: None  PAIN MANAGEMENT: no pain  NARCOTICS INDUCED CONSTIPATION: N/A  LIVING WILL AND CODE STATUS: Full Code.   INTERVAL HISTORY: Alvin Hardy 81 y.o. male returns to the clinic today for 6 months followup visit followup visit. Alvin Hardy has no complaints today. He had the flu 2 weeks ago and was treated with Tamiflu. He is feeling much better today. He denied having any significant fever or chills. He has no nausea or vomiting. He denied having any significant chest pain, shortness of breath, cough or hemoptysis. He had repeat CT scan of the chest performed recently and he is here for evaluation and discussion of his scan results.  MEDICAL HISTORY: Past Medical History  Diagnosis Date  . BPH (benign prostatic hyperplasia)   . Hypogonadism male   . Bipolar 1  disorder ([place])   . ED (erectile dysfunction)   . Hyperlipidemia   . Neuromuscular disorder ([place])     peripheral neuropathy  . Cataract 2010    Bilateral  . Laryngopharyngeal reflux   . Gilbert's syndrome   . Deviated septum     TO THE LEFT  . Morton's neuroma     LEFT FOOT  . Lung mass 02/07/13    RIGHT UPPER LOBE  . Shortness of breath     voice breaks, ? related to reflux, although told by Dr. Ardis Hughs, S.- early 35 yr. old  . GERD (gastroesophageal reflux disease)   . Arthritis     psoriatic arthritis, ? , treated /w mmethotrexate   . Finger injury     mallet finger-- 02/13/2013, cast in place, followed by dr. Fredna Dow  . Allergy   . Depression   . History of radiation therapy 07/24/13-08/27/13    50Gy/25 fx chest  . lung ca dx'd 12/2012    rul  . Chest pain     ALLERGIES:  is allergic to codeine; decadron; other; statins; zetia; and zoloft.  MEDICATIONS:  Current Outpatient Prescriptions  Medication Sig Dispense Refill  . aspirin EC 81 MG tablet Take 81 mg by mouth every morning.     . Calcium Carbonate-Vitamin D (CALTRATE 600+D) 600-400 MG-UNIT per tablet Take 1 tablet by mouth every morning.    . Cholecalciferol (VITAMIN D-3) 1000 UNITS CAPS Take 1,000 Units by mouth every morning.     . clobetasol cream (TEMOVATE) 4.65 % Apply 1 application topically daily.     . clonazePAM (KLONOPIN) 0.5 MG tablet  Take 0.5 mg by mouth at bedtime.    . cyanocobalamin 1000 MCG tablet Take 1,000 mcg by mouth every morning.     Marland Kitchen esomeprazole (NEXIUM) 40 MG capsule Take 40 mg by mouth daily before breakfast.    . finasteride (PROSCAR) 5 MG tablet Take 5 mg by mouth daily before breakfast.     . gabapentin (NEURONTIN) 300 MG capsule Take 600-900 mg by mouth 2 (two) times daily. He takes two capsules every morning and three capsules at bedtime.    Marland Kitchen lithium carbonate (LITHOBID) 300 MG CR tablet Take 600 mg by mouth at bedtime.     . Multiple Vitamin (MULTIVITAMIN WITH MINERALS) TABS tablet Take  1 tablet by mouth every morning.    Marland Kitchen OVER THE COUNTER MEDICATION Take 1 tablet by mouth 2 (two) times daily. Glucosamine Chondroitin '1500mg'$ /'1200mg'$     . pimecrolimus (ELIDEL) 1 % cream Apply 1 application topically 2 (two) times daily.    . Probiotic Product (PROBIOTIC DAILY PO) Take 1 each by mouth daily.    . propranolol (INDERAL) 10 MG tablet Take 10-20 mg by mouth daily as needed (For extremely stressful circumstances.).     Marland Kitchen tamsulosin (FLOMAX) 0.4 MG CAPS capsule Take 0.4 mg by mouth daily.  3  . testosterone cypionate (DEPOTESTOTERONE CYPIONATE) 200 MG/ML injection Inject 0.5 mLs into the muscle once a week.  1  . vardenafil (LEVITRA) 5 MG tablet Take 5 mg by mouth daily as needed for erectile dysfunction.      No current facility-administered medications for this visit.    SURGICAL HISTORY:  Past Surgical History  Procedure Laterality Date  . Prostate surgery  1996    TURP  . Knee arthroscopy Right 01/2007  . Pilonidal cyst excision  1960's  . Eye surgery      cataracts removed fr. both eyes, IOL in place   . Video assisted thoracoscopy (vats)/wedge resection Right 02/21/2013    Procedure: Right VIDEO ASSISTED THORACOSCOPY ,Thoracotomy with right upper lobectomy, node sampling ;  Surgeon: Grace Isaac, MD;  Location: [facility];  Service: Thoracic;  Laterality: Right;  . Video bronchoscopy N/A 02/21/2013    Procedure: VIDEO BRONCHOSCOPY;  Surgeon: Grace Isaac, MD;  Location: [facility];  Service: Thoracic;  Laterality: N/A;  . Cystoscopy N/A 02/21/2013    Procedure: CYSTOSCOPY FLEXIBLE with insertion of foley catheter;  Surgeon: Fredricka Bonine, MD;  Location: [facility];  Service: Urology;  Laterality: N/A;    REVIEW OF SYSTEMS:  A comprehensive review of systems was negative.   PHYSICAL EXAMINATION: General appearance: alert, cooperative and no distress Head: Normocephalic, without obvious abnormality, atraumatic Neck: no adenopathy Lymph nodes: Cervical, supraclavicular,  and axillary nodes normal. Resp: clear to auscultation bilaterally Cardio: regular rate and rhythm, S1, S2 normal, no murmur, click, rub or gallop GI: soft, non-tender; bowel sounds normal; no masses,  no organomegaly Extremities: extremities normal, atraumatic, no cyanosis or edema Neurologic: Alert and oriented X 3, normal strength and tone. Normal symmetric reflexes. Normal coordination and gait  ECOG PERFORMANCE STATUS: 0 - Asymptomatic  Blood pressure 127/70, pulse 96, temperature 98.3 F (36.8 C), temperature source Oral, resp. rate 18, height '5\' 11"'$  (1.803 m), weight 186 lb 11.2 oz (84.687 kg), SpO2 97 %.  LABORATORY DATA: Lab Results  Component Value Date   WBC 8.1 01/20/2016   HGB 15.5 01/20/2016   HCT 47.2 01/20/2016   MCV 86.0 01/20/2016   PLT 191 01/20/2016      Chemistry  Component Value Date/Time   NA 141 01/20/2016 0945   NA 135 09/07/2013 1145   K 4.2 01/20/2016 0945   K 5.0 09/07/2013 1145   CL 100 09/07/2013 1145   CL 107 04/24/2013 1110   CO2 26 01/20/2016 0945   CO2 23 09/07/2013 1145   BUN 13.8 01/20/2016 0945   BUN 15 09/07/2013 1145   CREATININE 1.0 01/20/2016 0945   CREATININE 0.80 09/07/2013 1145      Component Value Date/Time   CALCIUM 9.0 01/20/2016 0945   CALCIUM 9.7 09/07/2013 1145   ALKPHOS 51 01/20/2016 0945   ALKPHOS 45 06/19/2014 0900   AST 14 01/20/2016 0945   AST 19 06/19/2014 0900   ALT 18 01/20/2016 0945   ALT 19 06/19/2014 0900   BILITOT 1.12 01/20/2016 0945   BILITOT 1.2 06/19/2014 0900       RADIOGRAPHIC STUDIES: Ct Chest W Contrast  01/26/2016  CLINICAL DATA:  Right upper lobe lung cancer diagnosed 3/14. Partial right lung resection. Chemotherapy and radiation therapy complete. No current complaints. Restaging. EXAM: CT CHEST WITH CONTRAST TECHNIQUE: Multidetector CT imaging of the chest was performed during intravenous contrast administration. CONTRAST:  107m ISOVUE-300 IOPAMIDOL (ISOVUE-300) INJECTION 61%  COMPARISON:  07/22/2015 FINDINGS: Mediastinum/Nodes: No supraclavicular adenopathy. Aortic atherosclerosis. Normal heart size, without pericardial effusion. No mediastinal or hilar adenopathy. A tiny hiatal hernia. Lungs/Pleura: Trace right-sided pleural fluid and thickening are similar. Status post right upper lobectomy. Similar scarring along an accessory fissure at the right lung base. Example at 1.0 cm maximally on image 39/series 5. Clear left lung. Similar configuration of areas of architectural distortion and patchy ground-glass within the remaining medial right lung. Upper abdomen: Normal imaged portions of the liver, spleen, gallbladder, adrenal glands. Pancreatic atrophy. Normal image right kidney. Left renal 1.9 cm lesion which measures greater than fluid density at 36 HU. Similar in size to on the prior exam and fluid density back on 06/27/2013. Musculoskeletal: Moderate thoracic spondylosis. IMPRESSION: 1. Status post right upper lobectomy. Similar radiation changes within the right hemi thorax, without locally recurrent or metastatic disease. 2. A left renal lesion which is greater than fluid density today, but was consistent with a simple cyst on 06/27/2013. This therefore is consistent with a complex cyst. Electronically Signed   By: KAbigail MiyamotoM.D.   On: 01/26/2016 08:59   ASSESSMENT AND PLAN: This is a very pleasant 81years old white male with history of stage IIIa non-small cell lung cancer, adenocarcinoma with positive EGFR mutation status post right upper lobectomy with lymph node dissection followed by 4 cycles of adjuvant chemotherapy. His also status post adjuvant radiotherapy to the mediastinum. The patient has been on observation since October 2014 with no significant evidence for disease recurrence. The recent CT scan of the chest showed no concerning findings for disease recurrence. The scan also showed a simple cyst in the left kidney. I discussed the scan results with the patient  today and give him a copy of his report. I recommended for him to continue on observation with repeat CT scan of the chest in 6 months. The patient was advised to call immediately if he has any concerning symptoms in the interval.  The patient voices understanding of current disease status and treatment options and is in agreement with the current care plan.  All questions were answered. The patient knows to call the clinic with any problems, questions or concerns. We can certainly see the patient much sooner if necessary.  Disclaimer: This note  was dictated with voice recognition software. Similar sounding words can inadvertently be transcribed and may not be corrected upon review.

## 2016-01-29 ENCOUNTER — Encounter: Payer: Federal, State, Local not specified - PPO | Admitting: Cardiothoracic Surgery

## 2016-02-05 ENCOUNTER — Encounter: Payer: Federal, State, Local not specified - PPO | Admitting: Cardiothoracic Surgery

## 2016-02-06 ENCOUNTER — Encounter: Payer: Self-pay | Admitting: Cardiothoracic Surgery

## 2016-02-06 ENCOUNTER — Ambulatory Visit (INDEPENDENT_AMBULATORY_CARE_PROVIDER_SITE_OTHER): Payer: Federal, State, Local not specified - PPO | Admitting: Cardiothoracic Surgery

## 2016-02-06 VITALS — BP 127/73 | HR 90 | Resp 20 | Ht 71.0 in | Wt 186.0 lb

## 2016-02-06 DIAGNOSIS — R911 Solitary pulmonary nodule: Secondary | ICD-10-CM

## 2016-02-06 DIAGNOSIS — Z85118 Personal history of other malignant neoplasm of bronchus and lung: Secondary | ICD-10-CM

## 2016-02-06 NOTE — Progress Notes (Signed)
[address]       [place] [address]             [phone]                 [facility] Medical Record #[id number] Date of Birth: 06-02-1935  Alvin Bears, MD Jerlyn Ly, MD  Chief Complaint:   PostOp Follow Up Visit  02/21/2013   PREOPERATIVE DIAGNOSIS: Right upper lobe lung mass suspicious for  malignancy.  POSTOPERATIVE DIAGNOSIS: Right upper lobe lung mass suspicious for  malignancy. 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  PROCEDURE PERFORMED: Bronchoscopy, right upper lobe. Bronchoscopy  right video-assisted thoracoscopy, mini thoracotomy and right upper lobe  resection with lymph node dissection and placement of On-Q device.  SURGEON: Lanelle Bal, MD   Stage III A (T2 A., N2, M0) non-small cell lung cancer, adenocarcinoma with positive EGFR mutation in exon 19 and negative ALK gene translocation diagnosed in April 2014   Treatment :  1) Status post right upper lobectomy with lymph node dissection   02/21/2013.  2) Systemic adjuvant chemotherapy with carboplatin for AUC of 5 and Alimta 500 mg/M2 every 3 weeks. Status post 4 cycles. Last cycle was given on 05/29/2013. (Carboplatin was used in place of cisplatin secondary to concern about intolerability and significant adverse effects of cisplatin). Dr Julien Nordmann 3) Adjuvant radiotherapy to the mediastinum under the care of Dr. Lisbeth Renshaw completed 08/27/2013.      History of Present Illness:     Patient returns now 36 months postoperatively. He is return to normal activities without limitations. No hemoptysis.    History  Smoking status  . Former Smoker  . Types: Cigarettes  . Quit date: 11/01/1978  Smokeless tobacco  . Not on file       Allergies  Allergen Reactions  . Codeine Anaphylaxis  . Decadron [Dexamethasone]     Pt says it is contraindication due to the lithium he is taking  . Other     Pt. Remarks that all pain med.  Make him nauseated   . Statins     Lipitor, Crestor, Zocor all caused muscle aches  . Zetia [Ezetimibe]     Muscle aches  . Zoloft [Sertraline Hcl] Other (See Comments)    REACTION:  unknown    Current Outpatient Prescriptions  Medication Sig Dispense Refill  . aspirin EC 81 MG tablet Take 81 mg by mouth every morning.     . Calcium Carbonate-Vitamin D (CALTRATE 600+D) 600-400 MG-UNIT per tablet Take 1 tablet by mouth every morning.    . Cholecalciferol (VITAMIN D-3) 1000 UNITS CAPS Take 1,000 Units by mouth every morning.     . clobetasol cream (TEMOVATE) 4.82 % Apply 1 application topically daily. Reported on 01/27/2016    . clonazePAM (KLONOPIN) 0.5 MG tablet Take 0.5 mg by mouth at bedtime.    . cyanocobalamin 1000 MCG tablet Take 1,000 mcg by mouth every morning.     Marland Kitchen esomeprazole (NEXIUM) 40 MG capsule Take 40 mg by mouth daily before breakfast.    . finasteride (PROSCAR) 5 MG tablet Take 5 mg by mouth daily before breakfast.     . gabapentin (NEURONTIN) 300 MG capsule Take 600-900 mg by mouth 2 (two) times daily. He takes two capsules every morning and three capsules at bedtime.    Marland Kitchen lithium carbonate (LITHOBID) 300 MG CR tablet Take 600 mg by mouth at  bedtime.     . Multiple Vitamin (MULTIVITAMIN WITH MINERALS) TABS tablet Take 1 tablet by mouth every morning.    Marland Kitchen OVER THE COUNTER MEDICATION Take 1 tablet by mouth 2 (two) times daily. Glucosamine Chondroitin 1535m/1200mg    . Probiotic Product (PROBIOTIC DAILY PO) Take 1 each by mouth daily.    . propranolol (INDERAL) 10 MG tablet Take 10-20 mg by mouth daily as needed (For extremely stressful circumstances.).     .Marland Kitchentamsulosin (FLOMAX) 0.4 MG CAPS capsule Take 0.4 mg by mouth daily.  3  . testosterone cypionate (DEPOTESTOTERONE CYPIONATE) 200 MG/ML injection Inject 0.5 mLs into the muscle once a week.  1  . vardenafil (LEVITRA) 5 MG tablet Take 5 mg by mouth daily as needed for erectile dysfunction.      No current  facility-administered medications for this visit.       Physical Exam: BP 127/73 mmHg  Pulse 90  Resp 20  Ht '5\' 11"'  (1.803 m)  Wt 186 lb (84.369 kg)  BMI 25.95 kg/m2  SpO2 98%  Wt Readings from Last 3 Encounters:  02/06/16 186 lb (84.369 kg)  01/27/16 186 lb 11.2 oz (84.687 kg)  07/29/15 188 lb 11.2 oz (85.594 kg)   General appearance: alert, cooperative and no distress Neurologic: intact Heart: regular rate and rhythm, S1, S2 normal, no murmur, click, rub or gallop and normal apical impulse no carotid bruits  Lungs: clear to auscultation bilaterally and normal percussion bilaterally no stridor on exam Abdomen: soft, non-tender; bowel sounds normal; no masses,  no organomegaly Extremities: extremities normal, atraumatic, no cyanosis or edema and Homans sign is negative, no sign of DVT Wound: The right chest wall incisions are all well-healed Lymph nodes :Patient has no cervical or supraclavicular adenopathy,Diagnostic Studies & Laboratory data:         Recent Radiology Findings: Ct Chest W Contrast  01/26/2016  CLINICAL DATA:  Right upper lobe lung cancer diagnosed 3/14. Partial right lung resection. Chemotherapy and radiation therapy complete. No current complaints. Restaging. EXAM: CT CHEST WITH CONTRAST TECHNIQUE: Multidetector CT imaging of the chest was performed during intravenous contrast administration. CONTRAST:  741mISOVUE-300 IOPAMIDOL (ISOVUE-300) INJECTION 61% COMPARISON:  07/22/2015 FINDINGS: Mediastinum/Nodes: No supraclavicular adenopathy. Aortic atherosclerosis. Normal heart size, without pericardial effusion. No mediastinal or hilar adenopathy. A tiny hiatal hernia. Lungs/Pleura: Trace right-sided pleural fluid and thickening are similar. Status post right upper lobectomy. Similar scarring along an accessory fissure at the right lung base. Example at 1.0 cm maximally on image 39/series 5. Clear left lung. Similar configuration of areas of architectural distortion and  patchy ground-glass within the remaining medial right lung. Upper abdomen: Normal imaged portions of the liver, spleen, gallbladder, adrenal glands. Pancreatic atrophy. Normal image right kidney. Left renal 1.9 cm lesion which measures greater than fluid density at 36 HU. Similar in size to on the prior exam and fluid density back on 06/27/2013. Musculoskeletal: Moderate thoracic spondylosis. IMPRESSION: 1. Status post right upper lobectomy. Similar radiation changes within the right hemi thorax, without locally recurrent or metastatic disease. 2. A left renal lesion which is greater than fluid density today, but was consistent with a simple cyst on 06/27/2013. This therefore is consistent with a complex cyst. Electronically Signed   By: KyAbigail Miyamoto.D.   On: 01/26/2016 08:59   I have independently reviewed the above radiology studies  and reviewed the findings with the patient.  Ct Chest W Contrast  07/22/2015   CLINICAL DATA:  Subsequent evaluation of  a 81 year old male with history of lung cancer diagnosed in March 2014 status post prior lung surgery as well as chemotherapy and radiation therapy now complete.  EXAM: CT CHEST WITH CONTRAST  TECHNIQUE: Multidetector CT imaging of the chest was performed during intravenous contrast administration.  CONTRAST:  68m OMNIPAQUE IOHEXOL 300 MG/ML  SOLN  COMPARISON:  Chest CT 01/14/2015.  FINDINGS: Mediastinum/Lymph Nodes: Heart size is normal. There is no significant pericardial fluid, thickening or pericardial calcification. No pathologically enlarged mediastinal or hilar lymph nodes. Esophagus is unremarkable in appearance. No axillary lymphadenopathy.  Lungs/Pleura: Status post right upper lobectomy. Extensive architectural distortion noted in the paramediastinal aspect of the remaining portions of the right middle and lower lobes, with a few patchy areas of surrounding ground-glass attenuation, similar to prior studies, most compatible with chronic  postradiation changes. There is again a 10 x 5 mm area of nodular thickening of an accessory fissure in the right lower lobe posteriorly (image 37 of series 5), which is stable in appearance compared to prior study 07/11/2014, favored to represent an area of chronic scarring. No other suspicious appearing pulmonary nodules or masses are identified on today's examination. No acute consolidative airspace disease. No pleural effusions.  Upper Abdomen: Exophytic incompletely visualized intermediate attenuation (29 HU) lesion in the upper pole of the left kidney is incompletely characterize, but is similar to prior studies, favored to represent a proteinaceous cyst.  Musculoskeletal/Soft Tissues: There are no aggressive appearing lytic or blastic lesions noted in the visualized portions of the skeleton.  IMPRESSION: 1. Stable examination demonstrating postoperative and postradiation changes in the right hemithorax, as above. No definite findings to suggest residual/ recurrent disease or metastatic disease in the thorax. 2. Additional incidental findings, as above.   Electronically Signed   By: DVinnie LangtonM.D.   On: 07/22/2015 15:44   Ct Chest W Contrast  01/14/2015   CLINICAL DATA:  81year old male with history of right upper lobe lung cancer diagnosed in March 2014 status right upper lobectomy and status post chemotherapy and radiation therapy, completed in October 2014.  EXAM: CT CHEST WITH CONTRAST  TECHNIQUE: Multidetector CT imaging of the chest was performed during intravenous contrast administration.  CONTRAST:  883mOMNIPAQUE IOHEXOL 300 MG/ML  SOLN  COMPARISON:  Chest CT 07/11/2014.  FINDINGS: Mediastinum/Lymph Nodes: Heart size is normal. There is no significant pericardial fluid, thickening or pericardial calcification. No pathologically enlarged mediastinal or hilar lymph nodes. Esophagus is unremarkable in appearance. No axillary lymphadenopathy.  Lungs/Pleura: Status post right upper lobectomy.  Chronic postradiation changes are noted in the perihilar aspect of the remaining right middle and lower lobes, similar to prior examinations. There continues to be an ovoid shaped 10 x 5 mm nodule in the posterior aspect of the right lower lobe, which is associated with the inferior pulmonary ligament, and is similar in appearance to prior examinations dating back to 04/03/2014, favored to be a reactive subpleural lymph node. No other suspicious appearing pulmonary nodules or masses are otherwise noted. No acute consolidative airspace disease. No pleural effusions.  Upper Abdomen: 2.1 cm exophytic intermediate attenuation (22 HU) lesion in the upper pole of the left kidney is similar in size to numerous prior examinations dating back to 01/25/2013, presumably a proteinaceous cyst.  Musculoskeletal/Soft Tissues: There are no aggressive appearing lytic or blastic lesions noted in the visualized portions of the skeleton.  IMPRESSION: 1. Today's examination demonstrates stable post treatment related changes, as above, without definite findings to suggest recurrent disease in the  thorax. 2. Specifically, and 10 x 5 mm ovoid shaped subpleural nodule in the posterior aspect of the right lower lobe is unchanged, favored to represent a subpleural lymph node.   Electronically Signed   By: Vinnie Langton M.D.   On: 01/14/2015 16:00     Ct Chest W Contrast  07/11/2014   CLINICAL DATA:  Restaging lung cancer  EXAM: CT CHEST WITH CONTRAST  TECHNIQUE: Multidetector CT imaging of the chest was performed during intravenous contrast administration.  CONTRAST:  61m OMNIPAQUE IOHEXOL 300 MG/ML  SOLN  COMPARISON:  04/03/2014  FINDINGS: The heart size appears within normal limits. There is no pericardial effusion identified. The trachea appears patent. The esophagus is on unremarkable. No enlarged mediastinal or hilar lymph nodes identified. There is no axillary or supraclavicular adenopathy.  Small right pleural effusion  identified. Radiation change is identified within the paramediastinal right lung. 10 mm nodule in the posterior right lung base is stable from previous exam. No new pulmonary nodules identified.  Review of the visualized osseous structures is negative for aggressive lytic or sclerotic bone lesion. There is mild multi level spondylosis within the thoracic spine.  IMPRESSION: 1. No acute findings. 2. Stable appearance of postsurgical and radiation changes in the right lung. No specific features identified to suggest disease recurrence or metastatic disease. 3. Stable 10 mm nodule in the right lung.   Electronically Signed   By: TKerby MoorsM.D.   On: 07/11/2014 15:25  Ct Chest W Contrast  04/03/2014   CLINICAL DATA:  Lung cancer. Partial resection. Status post radiation therapy.  EXAM: CT CHEST WITH CONTRAST  TECHNIQUE: Multidetector CT imaging of the chest was performed during intravenous contrast administration.  CONTRAST:  819mOMNIPAQUE IOHEXOL 300 MG/ML  SOLN  COMPARISON:  01/01/2014.  09/07/2013.  FINDINGS: Soft tissue / Mediastinum: There is no axillary lymphadenopathy. No mediastinal or hilar lymphadenopathy. Surgical changes and post radiation fibrosis noted in the region of the right hilum, as before. Heart size is normal. No pericardial effusion.  Lungs / Pleura: Postradiation fibrosis in the medial right lung is stable in appearance. 10 mm nodule in the posterior right lung base is more can flow within on the previous study and probably reflects atelectasis. No pulmonary nodule in the left lung. No focal airspace consolidation or pulmonary edema. Small right pleural effusion.  Bones: Bone windows reveal no worrisome lytic or sclerotic osseous lesions.  Upper Abdomen:  Left upper pole renal cyst is unchanged.  IMPRESSION: Stable appearance of postsurgical and radiation change in the medial right upper lung. No definite disease recurrence.  10 mm nodule in the posterior right lung was not in area of  atelectasis on the previous study. This probably reflects coalescence of the atelectatic changes and attention to this area on followup is recommended.   Electronically Signed   By: ErMisty Stanley.D.   On: 04/03/2014 13:33  Ct Chest W Contrast  01/01/2014   CLINICAL DATA:  Lung cancer restaging  EXAM: CT CHEST WITH CONTRAST  TECHNIQUE: Multidetector CT imaging of the chest was performed during intravenous contrast administration.  CONTRAST:  80 cc of Omni  COMPARISON:  09/07/2013  FINDINGS: The heart size appears normal. No pericardial effusion. There is no mediastinal or hilar adenopathy. No axillary or supraclavicular adenopathy noted.  Small right pleural effusion is identified and is new from previous exam. Postoperative change and volume loss is identified involving the right hemi thorax. There are progressive radiation changes noted within the right  lung. No specific features are identified to suggest local tumor recurrence.  No axillary or supraclavicular adenopathy. Related through the upper abdomen is on unremarkable. There are no acute findings identified. The adrenal glands both appear normal. Stable cyst within the upper pole of the left kidney measuring 2.4 cm, image 64/series 2.  Review of the visualized osseous structures is significant for mild scoliosis and multilevel spondylosis involving the thoracic spine.  IMPRESSION: 1. Progressive changes of external beam radiation are identified within the right lung. 2. No evidence for residual or recurrence of tumor.   Electronically Signed   By: Kerby Moors M.D.   On: 01/01/2014 14:04   CLINICAL DATA: Status post lung surgery, cough shortness of breath,  hemoptysis  EXAM:  CT ANGIOGRAPHY CHEST WITH CONTRAST  TECHNIQUE:  Multidetector CT imaging of the chest was performed using the  standard protocol during bolus administration of intravenous  contrast. Multiplanar CT image reconstructions including MIPs were  obtained to evaluate the  vascular anatomy.  CONTRAST: 136m OMNIPAQUE IOHEXOL 350 MG/ML SOLN  COMPARISON: 06/27/2013  FINDINGS:  The thoracic inlet is unremarkable. The mediastinum demonstrates  linearly oriented increased soft tissue density within the anterior  superior right hilar region extending posteriorly. Linear areas of  high density are appreciated within these findings. Considering the  patient's history these findings abdominal wall appearance of  postsurgical changes. It appears stable when compared to the  previous study. Residual or recurrent disease cannot be excluded.  The There is no evidence of filling defects within the main, lobar,  or segmental pulmonary arteries.  The lung parenchyma demonstrates diffuse airspace disease in the  posterior aspect of the right upper lobe, superior segment right  lower lobe. Mild increased density projects in the the base of the  left lower lobe.  Visualized upper abdominal viscera grossly unremarkable. Multilevel  degenerative changes are appreciated within the thoracic spine.  Review of the MIP images confirms the above findings.  IMPRESSION:  1. No CT evidence of pulmonary arterial embolic disease  2. Diffuse areas of pneumonitis within the superior segment right  lower lobe and to a lesser extent at the posterior aspect of the  right upper lobe differential considerations are postsurgical  changes versus an infectious or inflammatory pneumonitis. Pulmonary  hemorrhage considering the patient's history is a diagnostic  consideration. Asymmetric edema is of much lower consideration  considering the findings in the remaining portions of the lung.  Electronically Signed  By: HMargaree MackintoshM.D.  On: 09/07/2013 14:26   Ct Chest W Contrast  06/27/2013   *RADIOLOGY REPORT*  Clinical Data: Staging lung cancer  CT CHEST WITH CONTRAST  Technique:  Multidetector CT imaging of the chest was performed following the standard protocol during bolus administration of  intravenous contrast.  Contrast: 840mOMNIPAQUE IOHEXOL 300 MG/ML  SOLN  Comparison: 02/14/2013  Findings: No pleural effusion identified.  The patient is status post right upper lobectomy. There is a new sub solid density within the right lung base measuring 1.1 x 1.8 cm, image 41/series 6. This is new from the previous exam.  Within the left lower lobe there is a 5 mm nodule, image 46/series 5.  Heart size appears normal.  There is no mediastinal or hilar adenopathy identified.  There is no enlarged axillary or supraclavicular adenopathy.  Limited imaging through the upper abdomen shows bilateral renal cysts.  No acute findings identified.  No mass or adenopathy.  Review of the visualized bony structures is significant for mild  scoliosis and multilevel spondylosis.  IMPRESSION:  1.  No acute cardiopulmonary abnormalities. 2.  Postoperative changes from right upper lobectomy. 3.  New sub solid density within the right lower lobe is nonspecific and may be treatment related.  Recommend follow-up imaging in 3 months to confirm persistence.   Original Report Authenticated By: Kerby Moors, M.D.    Recent Labs: Lab Results  Component Value Date   WBC 8.1 01/20/2016   HGB 15.5 01/20/2016   HCT 47.2 01/20/2016   PLT 191 01/20/2016   GLUCOSE 91 01/20/2016   CHOL 206* 06/19/2014   TRIG 216.0* 06/19/2014   HDL 38.50* 06/19/2014   LDLDIRECT 144.0 06/19/2014   ALT 18 01/20/2016   AST 14 01/20/2016   NA 141 01/20/2016   K 4.2 01/20/2016   CL 100 09/07/2013   CREATININE 1.0 01/20/2016   BUN 13.8 01/20/2016   CO2 26 01/20/2016   INR 0.99 02/20/2013   PATH: stage IIIA,  EGFR: Exon 19 deletion  Mutation detected Diagnosis 1. Lung, resection (segmental or lobe), Right upper lobe - ADENOCARCINOMA, 3.4 CM. - MARGINS NOT INVOLVED. - VISCERAL PLEURA FREE OF TUMOR. - ONE BENIGN LYMPH NODE. 2. Lymph node, biopsy, Right upper 4 R - METASTATIC ADENOCARCINOMA. 3. Lymph node, biopsy, Right 2 R - ANTHRACOTIC  LYMPH NODE. NO TUMOR IDENTIFIED. Microscopic Comment 1. LUNG Specimen, including laterality: Right lung lobe. Procedure: Lobectomy. Specimen integrity (intact/disrupted): Intact. Tumor site: Right upper lobe. Tumor focality: Unifocal Maximum tumor size (cm): 3.4 cm. Histologic type: Adenocarcinoma, bronchioalveolar cell type. Grade: I. Margins: Free of tumor. Visceral pleura invasion: No. Tumor extension: Within lobe. Treatment effect (if treated with neoadjuvant therapy): No. Lymph -Vascular invasion: Present. Lymph nodes: Number examined - 3 ; Number N1 nodes positive - 0 ; Number N2 nodes positive - 1 TNM code: pT2a , pN2, pMX Ancillary studies: EGFR and ALK pending. Non-neoplastic lung: Unremarkable     Assessment / Plan:    Stable following resection of right upper lobe, lung cancer which was found to be EGFR positive adenocarcinoma stage IIIa. Now 33month postop. Plan see the patient back in 6 months after his next CT scan.   EGrace Isaac4/05/2016 6:13 PM

## 2016-04-20 ENCOUNTER — Emergency Department (HOSPITAL_COMMUNITY)
Admission: EM | Admit: 2016-04-20 | Discharge: 2016-04-20 | Disposition: A | Discharge status: Home / Self Care | Payer: Federal, State, Local not specified - PPO | Attending: Emergency Medicine | Admitting: Emergency Medicine

## 2016-04-20 ENCOUNTER — Encounter (HOSPITAL_COMMUNITY): Payer: Self-pay | Admitting: Emergency Medicine

## 2016-04-20 DIAGNOSIS — Y929 Unspecified place or not applicable: Secondary | ICD-10-CM | POA: Insufficient documentation

## 2016-04-20 DIAGNOSIS — Z87891 Personal history of nicotine dependence: Secondary | ICD-10-CM | POA: Insufficient documentation

## 2016-04-20 DIAGNOSIS — S61211A Laceration without foreign body of left index finger without damage to nail, initial encounter: Secondary | ICD-10-CM | POA: Insufficient documentation

## 2016-04-20 DIAGNOSIS — Y939 Activity, unspecified: Secondary | ICD-10-CM | POA: Insufficient documentation

## 2016-04-20 DIAGNOSIS — M1990 Unspecified osteoarthritis, unspecified site: Secondary | ICD-10-CM | POA: Diagnosis not present

## 2016-04-20 DIAGNOSIS — W268XXA Contact with other sharp object(s), not elsewhere classified, initial encounter: Secondary | ICD-10-CM | POA: Insufficient documentation

## 2016-04-20 DIAGNOSIS — S61219A Laceration without foreign body of unspecified finger without damage to nail, initial encounter: Secondary | ICD-10-CM

## 2016-04-20 DIAGNOSIS — E785 Hyperlipidemia, unspecified: Secondary | ICD-10-CM | POA: Insufficient documentation

## 2016-04-20 DIAGNOSIS — Y999 Unspecified external cause status: Secondary | ICD-10-CM | POA: Diagnosis not present

## 2016-04-20 DIAGNOSIS — F319 Bipolar disorder, unspecified: Secondary | ICD-10-CM | POA: Insufficient documentation

## 2016-04-20 DIAGNOSIS — Z7982 Long term (current) use of aspirin: Secondary | ICD-10-CM | POA: Insufficient documentation

## 2016-04-20 MED ORDER — TETANUS-DIPHTH-ACELL PERTUSSIS 5-2.5-18.5 LF-MCG/0.5 IM SUSP
0.5000 mL | Freq: Once | INTRAMUSCULAR | Status: AC
  Administered 2016-04-20: 0.5 mL via INTRAMUSCULAR
  Filled 2016-04-20: qty 0.5

## 2016-04-20 NOTE — ED Provider Notes (Signed)
CSN: [id number]     Arrival date & time 04/20/16  2111 History  By signing my name below, I, Meriel Flavors, attest that this documentation has been prepared under the direction and in the presence of Samantha Dowless PA-C.  Electronically Signed: Meriel Flavors, ED Scribe. 03/28/2016. 4:01 PM.   Chief Complaint  Patient presents with  . Finger Injury   The history is provided by the patient. No language interpreter was used.   HPI Comments: Alvin Hardy is a 81 y.o. male who presents to the Emergency Department with a left index finger injury that occurred 4 hours ago. Pt reports that he had a band aid around the tip of his left index finger to cover up an area of dry skin and cut himself with scissors while attempting to cut the band aid off. Pt states bleeding was controlled with application of multiple band aids PTA. Pt states he takes '81mg'$  ASA qid. Pt is unsure if his Tetanus is up to date.    Past Medical History  Diagnosis Date  . BPH (benign prostatic hyperplasia)   . Hypogonadism male   . Bipolar 1 disorder ([place])   . ED (erectile dysfunction)   . Hyperlipidemia   . Neuromuscular disorder ([place])     peripheral neuropathy  . Cataract 2010    Bilateral  . Laryngopharyngeal reflux   . Gilbert's syndrome   . Deviated septum     TO THE LEFT  . Morton's neuroma     LEFT FOOT  . Lung mass 02/07/13    RIGHT UPPER LOBE  . Shortness of breath     voice breaks, ? related to reflux, although told by Dr. Ardis Hughs, S.- early 50 yr. old  . GERD (gastroesophageal reflux disease)   . Arthritis     psoriatic arthritis, ? , treated /w mmethotrexate   . Finger injury     mallet finger-- 02/13/2013, cast in place, followed by dr. Fredna Dow  . Allergy   . Depression   . History of radiation therapy 07/24/13-08/27/13    50Gy/25 fx chest  . lung ca dx'd 12/2012    rul  . Chest pain    Past Surgical History  Procedure Laterality Date  . Prostate surgery  1996    TURP  . Knee arthroscopy Right 01/2007   . Pilonidal cyst excision  1960's  . Eye surgery      cataracts removed fr. both eyes, IOL in place   . Video assisted thoracoscopy (vats)/wedge resection Right 02/21/2013    Procedure: Right VIDEO ASSISTED THORACOSCOPY ,Thoracotomy with right upper lobectomy, node sampling ;  Surgeon: Grace Isaac, MD;  Location: [facility];  Service: Thoracic;  Laterality: Right;  . Video bronchoscopy N/A 02/21/2013    Procedure: VIDEO BRONCHOSCOPY;  Surgeon: Grace Isaac, MD;  Location: [facility];  Service: Thoracic;  Laterality: N/A;  . Cystoscopy N/A 02/21/2013    Procedure: CYSTOSCOPY FLEXIBLE with insertion of foley catheter;  Surgeon: Fredricka Bonine, MD;  Location: [facility];  Service: Urology;  Laterality: N/A;   Family History  Problem Relation Age of Onset  . Diabetes Mother   . Cancer Mother     abdomen  . Heart disease Father   . Diabetes Son   . Cancer Maternal Aunt     lung ca, smoker   Social History  Substance Use Topics  . Smoking status: Former Smoker    Types: Cigarettes    Quit date: 11/01/1978  . Smokeless tobacco:  None  . Alcohol Use: Yes     Comment: occas.    Review of Systems  Musculoskeletal: Negative for arthralgias.  Skin: Positive for wound (left index finger).  All other systems reviewed and are negative.   Allergies  Codeine; Decadron; Other; Statins; Zetia; and Zoloft  Home Medications   Prior to Admission medications   Medication Sig Start Date End Date Taking? Authorizing Provider  aspirin EC 81 MG tablet Take 81 mg by mouth every morning.     Historical Provider, MD  Calcium Carbonate-Vitamin D (CALTRATE 600+D) 600-400 MG-UNIT per tablet Take 1 tablet by mouth every morning.    Historical Provider, MD  Cholecalciferol (VITAMIN D-3) 1000 UNITS CAPS Take 1,000 Units by mouth every morning.     Historical Provider, MD  clobetasol cream (TEMOVATE) 0.24 % Apply 1 application topically daily. Reported on 01/27/2016    Historical Provider, MD  clonazePAM  (KLONOPIN) 0.5 MG tablet Take 0.5 mg by mouth at bedtime.    Historical Provider, MD  cyanocobalamin 1000 MCG tablet Take 1,000 mcg by mouth every morning.     Historical Provider, MD  esomeprazole (NEXIUM) 40 MG capsule Take 40 mg by mouth daily before breakfast.    Historical Provider, MD  finasteride (PROSCAR) 5 MG tablet Take 5 mg by mouth daily before breakfast.     Historical Provider, MD  gabapentin (NEURONTIN) 300 MG capsule Take 600-900 mg by mouth 2 (two) times daily. He takes two capsules every morning and three capsules at bedtime.    Historical Provider, MD  lithium carbonate (LITHOBID) 300 MG CR tablet Take 600 mg by mouth at bedtime.     Historical Provider, MD  Multiple Vitamin (MULTIVITAMIN WITH MINERALS) TABS tablet Take 1 tablet by mouth every morning.    Historical Provider, MD  OVER THE COUNTER MEDICATION Take 1 tablet by mouth 2 (two) times daily. Glucosamine Chondroitin '1500mg'$ /'1200mg'$     Historical Provider, MD  Probiotic Product (PROBIOTIC DAILY PO) Take 1 each by mouth daily.    Historical Provider, MD  propranolol (INDERAL) 10 MG tablet Take 10-20 mg by mouth daily as needed (For extremely stressful circumstances.).     Historical Provider, MD  tamsulosin (FLOMAX) 0.4 MG CAPS capsule Take 0.4 mg by mouth daily. 12/19/14   Historical Provider, MD  testosterone cypionate (DEPOTESTOTERONE CYPIONATE) 200 MG/ML injection Inject 0.5 mLs into the muscle once a week. 11/05/14   Historical Provider, MD  vardenafil (LEVITRA) 5 MG tablet Take 5 mg by mouth daily as needed for erectile dysfunction.     Historical Provider, MD   BP 118/71 mmHg  Pulse 88  Temp(Src) 98.2 F (36.8 C) (Oral)  Resp 16  Ht '5\' 11"'$  (1.803 m)  Wt 184 lb (83.462 kg)  BMI 25.67 kg/m2  SpO2 94% Physical Exam  Constitutional: He is oriented to person, place, and time. He appears well-developed and well-nourished. No distress.  HENT:  Head: Normocephalic and atraumatic.  Eyes: Conjunctivae are normal. Right eye  exhibits no discharge. Left eye exhibits no discharge. No scleral icterus.  Cardiovascular: Normal rate.   Pulmonary/Chest: Effort normal.  Musculoskeletal:  No evidence of tendon injury. No decrease ROM of finger. Intact distal pulses.   Neurological: He is alert and oriented to person, place, and time. Coordination normal.  Skin: Skin is warm and dry. No rash noted. He is not diaphoretic. No erythema. No pallor.  1cm laceration to finger pad of left index finger. No foreign bodies seen or palpated.   Psychiatric:  He has a normal mood and affect. His behavior is normal.  Nursing note and vitals reviewed.   ED Course  Procedures   LACERATION REPAIR Performed by: Carlos Levering Authorized by: Carlos Levering Consent: Verbal consent obtained. Risks and benefits: risks, benefits and alternatives were discussed Consent given by: patient Patient identity confirmed: provided demographic data Prepped and Draped in normal sterile fashion Wound explored  Laceration Location: finger pad of left index finger  Laceration Length: 1cm  No Foreign Bodies seen or palpated  Anesthesia: local infiltration  Local anesthetic: none   Irrigation method: syringe Amount of cleaning: standard  Skin closure: approximated  Technique: dermabond  Patient tolerance: Patient tolerated the procedure well with no immediate complications.   DIAGNOSTIC STUDIES: Oxygen Saturation is 94% on RA, adequate by my interpretation.  COORDINATION OF CARE: 10:58 PM-Will use Dermabond. Discussed treatment plan with pt at bedside and pt agreed to plan.   Labs Review Labs Reviewed - No data to display  Imaging Review No results found. I have personally reviewed and evaluated these images and lab results as part of my medical decision-making.   EKG Interpretation None      MDM   Final diagnoses:  Finger laceration, initial encounter    Tdap booster given.Pressure irrigation  performed. Laceration occurred < 8 hours prior to repair which was well tolerated. Pt has no co morbidities to effect normal wound healing. Wound repaired with dermabond. Bleeding has subsided. Pt to f-u for wound check with PCP. Pt is hemodynamically stable w no complaints prior to dc.    I personally performed the services described in this documentation, which was scribed in my presence. The recorded information has been reviewed and is accurate.     Dondra Spry [place], PA-C 04/21/16 0128  Merrily Pew, MD 04/21/16 1159

## 2016-04-20 NOTE — ED Notes (Signed)
Bleeding is controlled at this time with multiple band aids placed by the patient.

## 2016-04-20 NOTE — Discharge Instructions (Signed)
Laceration Care, Adult  A laceration is a cut that goes through all layers of the skin. The cut also goes into the tissue that is right under the skin. Some cuts heal on their own. Others need to be closed with stitches (sutures), staples, skin adhesive strips, or wound glue. Taking care of your cut lowers your risk of infection and helps your cut to heal better.  HOW TO TAKE CARE OF YOUR CUT  For stitches or staples:  · Keep the wound clean and dry.  · If you were given a bandage (dressing), you should change it at least one time per day or as told by your doctor. You should also change it if it gets wet or dirty.  · Keep the wound completely dry for the first 24 hours or as told by your doctor. After that time, you may take a shower or a bath. However, make sure that the wound is not soaked in water until after the stitches or staples have been removed.  · Clean the wound one time each day or as told by your doctor:    Wash the wound with soap and water.    Rinse the wound with water until all of the soap comes off.    Pat the wound dry with a clean towel. Do not rub the wound.  · After you clean the wound, put a thin layer of antibiotic ointment on it as told by your doctor. This ointment:    Helps to prevent infection.    Keeps the bandage from sticking to the wound.  · Have your stitches or staples removed as told by your doctor.  If your doctor used skin adhesive strips:   · Keep the wound clean and dry.  · If you were given a bandage, you should change it at least one time per day or as told by your doctor. You should also change it if it gets dirty or wet.  · Do not get the skin adhesive strips wet. You can take a shower or a bath, but be careful to keep the wound dry.  · If the wound gets wet, pat it dry with a clean towel. Do not rub the wound.  · Skin adhesive strips fall off on their own. You can trim the strips as the wound heals. Do not remove any strips that are still stuck to the wound. They will  fall off after a while.  If your doctor used wound glue:  · Try to keep your wound dry, but you may briefly wet it in the shower or bath. Do not soak the wound in water, such as by swimming.  · After you take a shower or a bath, gently pat the wound dry with a clean towel. Do not rub the wound.  · Do not do any activities that will make you really sweaty until the skin glue has fallen off on its own.  · Do not apply liquid, cream, or ointment medicine to your wound while the skin glue is still on.  · If you were given a bandage, you should change it at least one time per day or as told by your doctor. You should also change it if it gets dirty or wet.  · If a bandage is placed over the wound, do not let the tape for the bandage touch the skin glue.  · Do not pick at the glue. The skin glue usually stays on for 5-10 days. Then, it   or when wound glue stays in place and the wound is healed. Make sure to wear a sunscreen of at least 30 SPF.  Take over-the-counter and prescription medicines only as told by your doctor.  If you were given antibiotic medicine or ointment, take or apply it as told by your doctor. Do not stop using the antibiotic even if your wound is getting better.  Do not scratch or pick at the wound.  Keep all follow-up visits as told by your doctor. This is important.  Check your wound every day for signs of infection. Watch for:  Redness, swelling, or pain.  Fluid, blood, or pus.  Raise (elevate) the injured area above the level of your heart while you are sitting or lying down, if possible. GET HELP IF:  You got a tetanus shot and you have any of these problems at the injection site:  Swelling.  Very bad pain.  Redness.  Bleeding.  You have a fever.  A wound that was  closed breaks open.  You notice a bad smell coming from your wound or your bandage.  You notice something coming out of the wound, such as wood or glass.  Medicine does not help your pain.  You have more redness, swelling, or pain at the site of your wound.  You have fluid, blood, or pus coming from your wound.  You notice a change in the color of your skin near your wound.  You need to change the bandage often because fluid, blood, or pus is coming from the wound.  You start to have a new rash.  You start to have numbness around the wound. GET HELP RIGHT AWAY IF:  You have very bad swelling around the wound.  Your pain suddenly gets worse and is very bad.  You notice painful lumps near the wound or on skin that is anywhere on your body.  You have a red streak going away from your wound.  The wound is on your hand or foot and you cannot move a finger or toe like you usually can.  The wound is on your hand or foot and you notice that your fingers or toes look pale or bluish.   This information is not intended to replace advice given to you by your health care provider. Make sure you discuss any questions you have with your health care provider.  Keep wound clean and dry. Dermabond will come off on its own. Follow up with your PCP for a wound check if your symptoms do not improve. Return to the ED if you experience severe worsening of your symptoms, fevers, chills, redness or sweling around your wound.

## 2016-04-20 NOTE — ED Notes (Signed)
Pt states today he was trying to cut a band aid off of his finger and accidentally cut his left pointer finger.  Pt is also on blood thinners.

## 2016-06-26 ENCOUNTER — Telehealth: Payer: Self-pay | Admitting: Internal Medicine

## 2016-06-26 NOTE — Telephone Encounter (Signed)
Moved 9/20 f/u to 9/19 due to working in a tx patient. Spoke with patient confirming appointments for lab as scheduled 9/13 and MM 9/19. Patient aware central radiology will call re scan in September.

## 2016-07-14 ENCOUNTER — Other Ambulatory Visit: Payer: Federal, State, Local not specified - PPO

## 2016-07-19 ENCOUNTER — Ambulatory Visit (HOSPITAL_COMMUNITY)
Admission: RE | Admit: 2016-07-19 | Discharge: 2016-07-19 | Disposition: A | Discharge status: Home / Self Care | Payer: Federal, State, Local not specified - PPO | Source: Ambulatory Visit | Attending: Internal Medicine | Admitting: Internal Medicine

## 2016-07-19 ENCOUNTER — Other Ambulatory Visit (HOSPITAL_BASED_OUTPATIENT_CLINIC_OR_DEPARTMENT_OTHER): Payer: Federal, State, Local not specified - PPO

## 2016-07-19 DIAGNOSIS — K219 Gastro-esophageal reflux disease without esophagitis: Secondary | ICD-10-CM

## 2016-07-19 DIAGNOSIS — E785 Hyperlipidemia, unspecified: Secondary | ICD-10-CM

## 2016-07-19 DIAGNOSIS — C3411 Malignant neoplasm of upper lobe, right bronchus or lung: Secondary | ICD-10-CM | POA: Diagnosis not present

## 2016-07-19 DIAGNOSIS — F319 Bipolar disorder, unspecified: Secondary | ICD-10-CM

## 2016-07-19 DIAGNOSIS — Z85118 Personal history of other malignant neoplasm of bronchus and lung: Secondary | ICD-10-CM

## 2016-07-19 DIAGNOSIS — N289 Disorder of kidney and ureter, unspecified: Secondary | ICD-10-CM | POA: Insufficient documentation

## 2016-07-19 DIAGNOSIS — Z902 Acquired absence of lung [part of]: Secondary | ICD-10-CM | POA: Diagnosis not present

## 2016-07-19 LAB — COMPREHENSIVE METABOLIC PANEL
ALBUMIN: 3.8 g/dL (ref 3.5–5.0)
ALK PHOS: 53 U/L (ref 40–150)
ALT: 21 U/L (ref 0–55)
ANION GAP: 9 meq/L (ref 3–11)
AST: 20 U/L (ref 5–34)
BILIRUBIN TOTAL: 1.26 mg/dL — AB (ref 0.20–1.20)
BUN: 13.8 mg/dL (ref 7.0–26.0)
CO2: 26 mEq/L (ref 22–29)
Calcium: 9.4 mg/dL (ref 8.4–10.4)
Chloride: 107 mEq/L (ref 98–109)
Creatinine: 1 mg/dL (ref 0.7–1.3)
EGFR: 70 mL/min/{1.73_m2} — AB (ref >90)
GLUCOSE: 93 mg/dL (ref 70–140)
POTASSIUM: 4.6 meq/L (ref 3.5–5.1)
SODIUM: 142 meq/L (ref 136–145)
TOTAL PROTEIN: 6.6 g/dL (ref 6.4–8.3)

## 2016-07-19 LAB — CBC WITH DIFFERENTIAL/PLATELET
BASO%: 1.1 % (ref 0.0–2.0)
BASOS ABS: 0.1 10*3/uL (ref 0.0–0.1)
EOS ABS: 0.2 10*3/uL (ref 0.0–0.5)
EOS%: 2.9 % (ref 0.0–7.0)
HCT: 46.9 % (ref 38.4–49.9)
HEMOGLOBIN: 15.4 g/dL (ref 13.0–17.1)
LYMPH%: 22.7 % (ref 14.0–49.0)
MCH: 28.4 pg (ref 27.2–33.4)
MCHC: 32.8 g/dL (ref 32.0–36.0)
MCV: 86.4 fL (ref 79.3–98.0)
MONO#: 0.7 10*3/uL (ref 0.1–0.9)
MONO%: 8.7 % (ref 0.0–14.0)
NEUT%: 64.6 % (ref 39.0–75.0)
NEUTROS ABS: 5.3 10*3/uL (ref 1.5–6.5)
PLATELETS: 208 10*3/uL (ref 140–400)
RBC: 5.43 10*6/uL (ref 4.20–5.82)
RDW: 15.1 % — ABNORMAL HIGH (ref 11.0–14.6)
WBC: 8.2 10*3/uL (ref 4.0–10.3)
lymph#: 1.9 10*3/uL (ref 0.9–3.3)

## 2016-07-19 MED ORDER — IOPAMIDOL (ISOVUE-300) INJECTION 61%
75.0000 mL | Freq: Once | INTRAVENOUS | Status: AC | PRN
  Administered 2016-07-19: 75 mL via INTRAVENOUS

## 2016-07-20 ENCOUNTER — Encounter: Payer: Self-pay | Admitting: *Deleted

## 2016-07-20 ENCOUNTER — Encounter: Payer: Self-pay | Admitting: Internal Medicine

## 2016-07-20 ENCOUNTER — Ambulatory Visit (HOSPITAL_BASED_OUTPATIENT_CLINIC_OR_DEPARTMENT_OTHER): Payer: Federal, State, Local not specified - PPO | Admitting: Internal Medicine

## 2016-07-20 VITALS — BP 103/86 | HR 84 | Temp 98.1°F | Resp 18 | Ht 71.0 in | Wt 191.9 lb

## 2016-07-20 DIAGNOSIS — Z85118 Personal history of other malignant neoplasm of bronchus and lung: Secondary | ICD-10-CM

## 2016-07-20 DIAGNOSIS — C3411 Malignant neoplasm of upper lobe, right bronchus or lung: Secondary | ICD-10-CM

## 2016-07-20 NOTE — Progress Notes (Signed)
Alvin Hardy:(336) [phone]   Fax:(336) [phone]  OFFICE PROGRESS NOTE  Jerlyn Ly, MD [place] [address]  DIAGNOSIS AND STAGE: Stage III A (T2 A., N2, M0) non-small cell lung cancer, adenocarcinoma with positive EGFR mutation in exon 19 and negative ALK gene translocation diagnosed in April 2014   PRIOR THERAPY:  1) Status post right upper lobectomy with lymph node dissection under the care of Dr. Servando Snare on 02/21/2013. 2) Systemic adjuvant chemotherapy with carboplatin for AUC of 5 and Alimta 500 mg/M2 every 3 weeks. Status post 4 cycles. Last cycle was given on 05/29/2013. (Carboplatin was used in place of cisplatin secondary to concern about intolerability and significant adverse effects of cisplatin).  3) Adjuvant radiotherapy to the mediastinum under the care of Dr. Lisbeth Renshaw completed 08/27/2013.   CURRENT THERAPY: Observation  CHEMOTHERAPY INTENT: adjuvant/curative  CURRENT # OF CHEMOTHERAPY CYCLES: 0 CURRENT ANTIEMETICS: Zofran, Decadron and Compazine  CURRENT SMOKING STATUS: currently a nonsmoker  ORAL CHEMOTHERAPY AND CONSENT: None  CURRENT BISPHOSPHONATES USE: None  PAIN MANAGEMENT: no pain  NARCOTICS INDUCED CONSTIPATION: N/A  LIVING WILL AND CODE STATUS: Full Code.   INTERVAL HISTORY: Alvin Hardy 81 y.o. male returns to the clinic today for 6 months followup visit followup visit. Alvin Hardy has no complaints today. He is feeling much better today. He denied having any significant fever or chills. He has no nausea or vomiting. He denied having any significant chest pain, shortness of breath, cough or hemoptysis. He was started on new injection medication for cholesterol and handling it much better. He had repeat CT scan of the chest performed recently and he is here for evaluation and discussion of his scan results. He has more difficulty scheduling his CT scan this time.  MEDICAL HISTORY: Past Medical History:    Diagnosis Date  . Allergy   . Arthritis    psoriatic arthritis, ? , treated /w mmethotrexate   . Bipolar 1 disorder ([place])   . BPH (benign prostatic hyperplasia)   . Cataract 2010   Bilateral  . Chest pain   . Depression   . Deviated septum    TO THE LEFT  . ED (erectile dysfunction)   . Finger injury    mallet finger-- 02/13/2013, cast in place, followed by dr. Fredna Dow  . GERD (gastroesophageal reflux disease)   . Gilbert's syndrome   . History of radiation therapy 07/24/13-08/27/13   50Gy/25 fx chest  . Hyperlipidemia   . Hypogonadism male   . Laryngopharyngeal reflux   . lung ca dx'd 12/2012   rul  . Lung mass 02/07/13   RIGHT UPPER LOBE  . Morton's neuroma    LEFT FOOT  . Neuromuscular disorder ([place])    peripheral neuropathy  . Shortness of breath    voice breaks, ? related to reflux, although told by Dr. Ardis Hughs, S.- early 77 yr. old    ALLERGIES:  is allergic to codeine; decadron [dexamethasone]; other; statins; zetia [ezetimibe]; and zoloft [sertraline hcl].  MEDICATIONS:  Current Outpatient Prescriptions  Medication Sig Dispense Refill  . aspirin EC 81 MG tablet Take 81 mg by mouth every morning.     . Calcium Carbonate-Vitamin D (CALTRATE 600+D) 600-400 MG-UNIT per tablet Take 1 tablet by mouth every morning.    . Cholecalciferol (VITAMIN D-3) 1000 UNITS CAPS Take 1,000 Units by mouth every morning.     . clobetasol cream (TEMOVATE) 2.91 % Apply 1 application topically daily. Reported on 01/27/2016    .  clonazePAM (KLONOPIN) 0.5 MG tablet Take 0.5 mg by mouth at bedtime.    . cyanocobalamin 1000 MCG tablet Take 1,000 mcg by mouth every morning.     Marland Kitchen esomeprazole (NEXIUM) 40 MG capsule Take 40 mg by mouth daily before breakfast.    . finasteride (PROSCAR) 5 MG tablet Take 5 mg by mouth daily before breakfast.     . gabapentin (NEURONTIN) 300 MG capsule Take 600-900 mg by mouth 2 (two) times daily. He takes two capsules every morning and three capsules at bedtime.    Marland Kitchen  lithium carbonate (LITHOBID) 300 MG CR tablet Take 600 mg by mouth at bedtime.     . Multiple Vitamin (MULTIVITAMIN WITH MINERALS) TABS tablet Take 1 tablet by mouth every morning.    Marland Kitchen OVER THE COUNTER MEDICATION Take 1 tablet by mouth 2 (two) times daily. Glucosamine Chondroitin 1561m/1200mg    . Probiotic Product (PROBIOTIC DAILY PO) Take 1 each by mouth daily.    . propranolol (INDERAL) 10 MG tablet Take 10-20 mg by mouth daily as needed (For extremely stressful circumstances.).     .Marland Kitchentamsulosin (FLOMAX) 0.4 MG CAPS capsule Take 0.4 mg by mouth daily.  3  . testosterone cypionate (DEPOTESTOTERONE CYPIONATE) 200 MG/ML injection Inject 0.5 mLs into the muscle once a week.  1  . vardenafil (LEVITRA) 5 MG tablet Take 5 mg by mouth daily as needed for erectile dysfunction.      No current facility-administered medications for this visit.     SURGICAL HISTORY:  Past Surgical History:  Procedure Laterality Date  . CYSTOSCOPY N/A 02/21/2013   Procedure: CYSTOSCOPY FLEXIBLE with insertion of foley catheter;  Surgeon: MFredricka Bonine MD;  [facility]  Service: Urology;  Laterality: N/A;  . EYE SURGERY     cataracts removed fr. both eyes, IOL in place   . KNEE ARTHROSCOPY Right 01/2007  . PILONIDAL CYST EXCISION  1960's  . [facility]  . VIDEO ASSISTED THORACOSCOPY (VATS)/WEDGE RESECTION Right 02/21/2013   Procedure: Right VIDEO ASSISTED THORACOSCOPY ,Thoracotomy with right upper lobectomy, node sampling ;  Surgeon: EGrace Isaac MD;  [facility]  Service: Thoracic;  Laterality: Right;  .Marland KitchenVIDEO BRONCHOSCOPY N/A 02/21/2013   Procedure: VIDEO BRONCHOSCOPY;  Surgeon: EGrace Isaac MD;  [facility];  Service: Thoracic;  Laterality: N/A;    REVIEW OF SYSTEMS:  A comprehensive review of systems was negative.   PHYSICAL EXAMINATION: General appearance: alert, cooperative and no distress Head: Normocephalic, without obvious abnormality,  atraumatic Neck: no adenopathy Lymph nodes: Cervical, supraclavicular, and axillary nodes normal. Resp: clear to auscultation bilaterally Cardio: regular rate and rhythm, S1, S2 normal, no murmur, click, rub or gallop GI: soft, non-tender; bowel sounds normal; no masses,  no organomegaly Extremities: extremities normal, atraumatic, no cyanosis or edema Neurologic: Alert and oriented X 3, normal strength and tone. Normal symmetric reflexes. Normal coordination and gait  ECOG PERFORMANCE STATUS: 0 - Asymptomatic  Blood pressure 103/86, pulse 84, temperature 98.1 F (36.7 C), temperature source Oral, resp. rate 18, height '5\' 11"'  (1.803 m), weight 191 lb 14.4 oz (87 kg), SpO2 99 %.  LABORATORY DATA: Lab Results  Component Value Date   WBC 8.2 07/19/2016   HGB 15.4 07/19/2016   HCT 46.9 07/19/2016   MCV 86.4 07/19/2016   PLT 208 07/19/2016      Chemistry      Component Value Date/Time   NA 142 07/19/2016 0929   K  4.6 07/19/2016 0929   CL 100 09/07/2013 1145   CL 107 04/24/2013 1110   CO2 26 07/19/2016 0929   BUN 13.8 07/19/2016 0929   CREATININE 1.0 07/19/2016 0929      Component Value Date/Time   CALCIUM 9.4 07/19/2016 0929   ALKPHOS 53 07/19/2016 0929   AST 20 07/19/2016 0929   ALT 21 07/19/2016 0929   BILITOT 1.26 (H) 07/19/2016 0929       RADIOGRAPHIC STUDIES: Ct Chest W Contrast  Result Date: 07/19/2016 CLINICAL DATA:  Malignant neoplasm of right upper lobe. Restaging. Partial right-sided resection. Chemotherapy and radiation therapy completed 08/27/2013. EXAM: CT CHEST WITH CONTRAST TECHNIQUE: Multidetector CT imaging of the chest was performed during intravenous contrast administration. CONTRAST:  12m ISOVUE-300 IOPAMIDOL (ISOVUE-300) INJECTION 61% COMPARISON:  01/26/2016 FINDINGS: Cardiovascular: Aortic and branch vessel atherosclerosis. Tortuous thoracic aorta. Normal heart size, without pericardial effusion. No central pulmonary embolism, on this non-dedicated  study. Mediastinum/Nodes: No supraclavicular adenopathy. No mediastinal or hilar adenopathy. Lungs/Pleura: Trace right-sided pleural fluid or thickening are similar. Status post right upper lobectomy. Similar configuration of right paramediastinal radiation fibrosis. Presumed scarring in the right lower lobe on image 85/ series 5 is unchanged. Mild right apical ground-glass opacity is not significantly changed on image 27/series 5 and also favored to represent scarring. Upper Abdomen: Normal imaged portions of the liver, spleen, stomach, pancreas, adrenal glands. Incompletely imaged left renal lesion measures 1.8 cm and greater than fluid density, similar to on the prior exam. Musculoskeletal: Moderate thoracic spondylosis. No focal osseous lesion. IMPRESSION: 1. Status post right upper lobectomy. No evidence of recurrent or metastatic disease. 2. Incompletely imaged left renal lesion is again favored to represent a complex cyst, when compared to prior pre and postcontrast exams. Electronically Signed   By: KAbigail MiyamotoM.D.   On: 07/19/2016 11:59   ASSESSMENT AND PLAN: This is a very pleasant 81years old white male with history of stage IIIa non-small cell lung cancer, adenocarcinoma with positive EGFR mutation status post right upper lobectomy with lymph node dissection followed by 4 cycles of adjuvant chemotherapy. His also status post adjuvant radiotherapy to the mediastinum. The patient has been on observation since October 2014 with no significant evidence for disease recurrence. The recent CT scan of the chest showed no concerning findings for disease recurrence.  I recommended for him to continue on observation with repeat CT scan of the chest in 6 months. The patient was advised to call immediately if he has any concerning symptoms in the interval.  The patient voices understanding of current disease status and treatment options and is in agreement with the current care plan.  All questions were  answered. The patient knows to call the clinic with any problems, questions or concerns. We can certainly see the patient much sooner if necessary.  Disclaimer: This note was dictated with voice recognition software. Similar sounding words can inadvertently be transcribed and may not be corrected upon review.

## 2016-07-20 NOTE — Progress Notes (Signed)
Oncology Nurse Navigator Documentation  Oncology Nurse Navigator Flowsheets 07/20/2016  Navigator Encounter Type Clinic/MDC/I spoke with patient today at [facility].  He is doing well without complaints.  He stated he got a good report on his recent scan results. No barriers identified at this time.   Patient Visit Type Follow-up;MedOnc  Treatment Phase Post-Tx Follow-up  Barriers/Navigation Needs No barriers at this time  Interventions None required  Acuity Level 1  Acuity Level 1 Minimal follow up required  Time Spent with Patient 15

## 2016-07-21 ENCOUNTER — Telehealth: Payer: Self-pay | Admitting: Medical Oncology

## 2016-07-21 ENCOUNTER — Ambulatory Visit: Payer: Federal, State, Local not specified - PPO | Admitting: Internal Medicine

## 2016-07-21 NOTE — Telephone Encounter (Signed)
Faxed ct scans to his providers

## 2016-07-22 ENCOUNTER — Encounter: Payer: Self-pay | Admitting: Internal Medicine

## 2016-07-23 ENCOUNTER — Encounter: Payer: Self-pay | Admitting: Medical Oncology

## 2016-07-28 ENCOUNTER — Encounter: Payer: Self-pay | Admitting: Medical Oncology

## 2016-07-28 ENCOUNTER — Telehealth: Payer: Self-pay | Admitting: Internal Medicine

## 2016-07-28 NOTE — Telephone Encounter (Signed)
Left message for patient re March. [facility] will call re scan. Schedule mailed.

## 2016-07-29 ENCOUNTER — Ambulatory Visit (INDEPENDENT_AMBULATORY_CARE_PROVIDER_SITE_OTHER): Payer: Federal, State, Local not specified - PPO | Admitting: Cardiothoracic Surgery

## 2016-07-29 ENCOUNTER — Encounter: Payer: Self-pay | Admitting: Cardiothoracic Surgery

## 2016-07-29 ENCOUNTER — Encounter: Payer: Federal, State, Local not specified - PPO | Admitting: Cardiothoracic Surgery

## 2016-07-29 VITALS — BP 120/80 | HR 96 | Resp 20 | Wt 189.0 lb

## 2016-07-29 DIAGNOSIS — R911 Solitary pulmonary nodule: Secondary | ICD-10-CM | POA: Diagnosis not present

## 2016-07-29 DIAGNOSIS — Z85118 Personal history of other malignant neoplasm of bronchus and lung: Secondary | ICD-10-CM

## 2016-07-29 NOTE — Progress Notes (Signed)
[address]       [place] [address]             [phone]                 Alvin Hardy [facility] Medical Record #[id number] Date of Birth: April 27, 1935  Curt Bears, MD Jerlyn Ly, MD  Chief Complaint:   PostOp Follow Up Visit  02/21/2013   PREOPERATIVE DIAGNOSIS: Right upper lobe lung mass suspicious for  malignancy.  POSTOPERATIVE DIAGNOSIS: Right upper lobe lung mass suspicious for  malignancy. 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  PROCEDURE PERFORMED: Bronchoscopy, right upper lobe. Bronchoscopy  right video-assisted thoracoscopy, mini thoracotomy and right upper lobe  resection with lymph node dissection and placement of On-Q device.  SURGEON: Lanelle Bal, MD   Stage III A (T2 A., N2, M0) non-small cell lung cancer, adenocarcinoma with positive EGFR mutation in exon 19 and negative ALK gene translocation diagnosed in April 2014   Treatment :  1) Status post right upper lobectomy with lymph node dissection   02/21/2013.  2) Systemic adjuvant chemotherapy with carboplatin for AUC of 5 and Alimta 500 mg/M2 every 3 weeks. Status post 4 cycles. Last cycle was given on 05/29/2013. (Carboplatin was used in place of cisplatin secondary to concern about intolerability and significant adverse effects of cisplatin). Dr Julien Nordmann 3) Adjuvant radiotherapy to the mediastinum under the care of Dr. Lisbeth Renshaw completed 08/27/2013.      History of Present Illness:     Patient returns now 3.5 years  postoperatively. He is return to normal activities without limitations. No hemoptysis. He notes his flu shot is up-to-date. Recent CT scan of the chest was done.    History  Smoking Status  . Former Smoker  . Types: Cigarettes  . Quit date: 11/01/1978  Smokeless Tobacco  . Not on file       Allergies  Allergen Reactions  . Codeine Anaphylaxis  . Decadron [Dexamethasone]     Pt says it is contraindication due  to the lithium he is taking  . Other     Pt. Remarks that all pain med. Make him nauseated   . Statins     Lipitor, Crestor, Zocor all caused muscle aches  . Zetia [Ezetimibe]     Muscle aches  . Zoloft [Sertraline Hcl] Other (See Comments)    REACTION:  unknown    Current Outpatient Prescriptions  Medication Sig Dispense Refill  . aspirin EC 81 MG tablet Take 81 mg by mouth every morning.     . Calcium Carbonate-Vitamin D (CALTRATE 600+D) 600-400 MG-UNIT per tablet Take 1 tablet by mouth every morning.    . Cholecalciferol (VITAMIN D-3) 1000 UNITS CAPS Take 1,000 Units by mouth every morning.     . clobetasol cream (TEMOVATE) 5.85 % Apply 1 application topically daily. Reported on 01/27/2016    . clonazePAM (KLONOPIN) 0.5 MG tablet Take 0.5 mg by mouth at bedtime.    . cyanocobalamin 1000 MCG tablet Take 1,000 mcg by mouth every morning.     Marland Kitchen esomeprazole (NEXIUM) 40 MG capsule Take 40 mg by mouth daily before breakfast.    . finasteride (PROSCAR) 5 MG tablet Take 5 mg by mouth daily before breakfast.     . gabapentin (NEURONTIN) 300 MG capsule Take 600-900 mg by mouth 2 (two) times daily. He takes two capsules every morning and three capsules at bedtime.    Marland Kitchen  lithium carbonate (LITHOBID) 300 MG CR tablet Take 600 mg by mouth at bedtime.     . Multiple Vitamin (MULTIVITAMIN WITH MINERALS) TABS tablet Take 1 tablet by mouth every morning.    Marland Kitchen OVER THE COUNTER MEDICATION Take 1 tablet by mouth 2 (two) times daily. Glucosamine Chondroitin 1533m/1200mg    . Probiotic Product (PROBIOTIC DAILY PO) Take 1 each by mouth daily.    . propranolol (INDERAL) 10 MG tablet Take 10-20 mg by mouth daily as needed (For extremely stressful circumstances.).     .Marland KitchenREPATHA SURECLICK 1737MG/ML SOAJ     . tamsulosin (FLOMAX) 0.4 MG CAPS capsule Take 0.4 mg by mouth daily.  3  . testosterone cypionate (DEPOTESTOTERONE CYPIONATE) 200 MG/ML injection Inject 0.5 mLs into the muscle once a week.  1  . vardenafil  (LEVITRA) 5 MG tablet Take 5 mg by mouth daily as needed for erectile dysfunction.      No current facility-administered medications for this visit.        Physical Exam: There were no vitals taken for this visit.  Wt Readings from Last 3 Encounters:  07/20/16 191 lb 14.4 oz (87 kg)  04/20/16 184 lb (83.5 kg)  02/06/16 186 lb (84.4 kg)   General appearance: alert, cooperative and no distress Neurologic: intact Heart: regular rate and rhythm, S1, S2 normal, no murmur, click, rub or gallop and normal apical impulse no carotid bruits  Lungs: clear to auscultation bilaterally and normal percussion bilaterally no stridor on exam Abdomen: soft, non-tender; bowel sounds normal; no masses,  no organomegaly Extremities: extremities normal, atraumatic, no cyanosis or edema and Homans sign is negative, no sign of DVT Wound: The right chest wall incisions are all well-healed Lymph nodes :Patient has no cervical or supraclavicular adenopathy,Diagnostic Studies & Laboratory data:         Recent Radiology Findings: Ct Chest W Contrast  Result Date: 07/19/2016 CLINICAL DATA:  Malignant neoplasm of right upper lobe. Restaging. Partial right-sided resection. Chemotherapy and radiation therapy completed 08/27/2013. EXAM: CT CHEST WITH CONTRAST TECHNIQUE: Multidetector CT imaging of the chest was performed during intravenous contrast administration. CONTRAST:  766mISOVUE-300 IOPAMIDOL (ISOVUE-300) INJECTION 61% COMPARISON:  01/26/2016 FINDINGS: Cardiovascular: Aortic and branch vessel atherosclerosis. Tortuous thoracic aorta. Normal heart size, without pericardial effusion. No central pulmonary embolism, on this non-dedicated study. Mediastinum/Nodes: No supraclavicular adenopathy. No mediastinal or hilar adenopathy. Lungs/Pleura: Trace right-sided pleural fluid or thickening are similar. Status post right upper lobectomy. Similar configuration of right paramediastinal radiation fibrosis. Presumed scarring  in the right lower lobe on image 85/ series 5 is unchanged. Mild right apical ground-glass opacity is not significantly changed on image 27/series 5 and also favored to represent scarring. Upper Abdomen: Normal imaged portions of the liver, spleen, stomach, pancreas, adrenal glands. Incompletely imaged left renal lesion measures 1.8 cm and greater than fluid density, similar to on the prior exam. Musculoskeletal: Moderate thoracic spondylosis. No focal osseous lesion. IMPRESSION: 1. Status post right upper lobectomy. No evidence of recurrent or metastatic disease. 2. Incompletely imaged left renal lesion is again favored to represent a complex cyst, when compared to prior pre and postcontrast exams. Electronically Signed   By: KyAbigail Miyamoto.D.   On: 07/19/2016 11:59   I have independently reviewed the above radiology studies  and reviewed the findings with the patient.  Ct Chest W Contrast  07/22/2015   CLINICAL DATA:  Subsequent evaluation of a [age]ear old male with history of lung cancer diagnosed in March 2014 status  post prior lung surgery as well as chemotherapy and radiation therapy now complete.  EXAM: CT CHEST WITH CONTRAST  TECHNIQUE: Multidetector CT imaging of the chest was performed during intravenous contrast administration.  CONTRAST:  45m OMNIPAQUE IOHEXOL 300 MG/ML  SOLN  COMPARISON:  Chest CT 01/14/2015.  FINDINGS: Mediastinum/Lymph Nodes: Heart size is normal. There is no significant pericardial fluid, thickening or pericardial calcification. No pathologically enlarged mediastinal or hilar lymph nodes. Esophagus is unremarkable in appearance. No axillary lymphadenopathy.  Lungs/Pleura: Status post right upper lobectomy. Extensive architectural distortion noted in the paramediastinal aspect of the remaining portions of the right middle and lower lobes, with a few patchy areas of surrounding ground-glass attenuation, similar to prior studies, most compatible with chronic postradiation changes.  There is again a 10 x 5 mm area of nodular thickening of an accessory fissure in the right lower lobe posteriorly (image 37 of series 5), which is stable in appearance compared to prior study 07/11/2014, favored to represent an area of chronic scarring. No other suspicious appearing pulmonary nodules or masses are identified on today's examination. No acute consolidative airspace disease. No pleural effusions.  Upper Abdomen: Exophytic incompletely visualized intermediate attenuation (29 HU) lesion in the upper pole of the left kidney is incompletely characterize, but is similar to prior studies, favored to represent a proteinaceous cyst.  Musculoskeletal/Soft Tissues: There are no aggressive appearing lytic or blastic lesions noted in the visualized portions of the skeleton.  IMPRESSION: 1. Stable examination demonstrating postoperative and postradiation changes in the right hemithorax, as above. No definite findings to suggest residual/ recurrent disease or metastatic disease in the thorax. 2. Additional incidental findings, as above.   Electronically Signed   By: DVinnie LangtonM.D.   On: 07/22/2015 15:44   Recent Labs: Lab Results  Component Value Date   WBC 8.2 07/19/2016   HGB 15.4 07/19/2016   HCT 46.9 07/19/2016   PLT 208 07/19/2016   GLUCOSE 93 07/19/2016   CHOL 206 (H) 06/19/2014   TRIG 216.0 (H) 06/19/2014   HDL 38.50 (L) 06/19/2014   LDLDIRECT 144.0 06/19/2014   ALT 21 07/19/2016   AST 20 07/19/2016   NA 142 07/19/2016   K 4.6 07/19/2016   CL 100 09/07/2013   CREATININE 1.0 07/19/2016   BUN 13.8 07/19/2016   CO2 26 07/19/2016   INR 0.99 02/20/2013   PATH: stage IIIA,  EGFR: Exon 19 deletion  Mutation detected Diagnosis 1. Lung, resection (segmental or lobe), Right upper lobe - ADENOCARCINOMA, 3.4 CM. - MARGINS NOT INVOLVED. - VISCERAL PLEURA FREE OF TUMOR. - ONE BENIGN LYMPH NODE. 2. Lymph node, biopsy, Right upper 4 R - METASTATIC ADENOCARCINOMA. 3. Lymph node, biopsy,  Right 2 R - ANTHRACOTIC LYMPH NODE. NO TUMOR IDENTIFIED. Microscopic Comment 1. LUNG Specimen, including laterality: Right lung lobe. Procedure: Lobectomy. Specimen integrity (intact/disrupted): Intact. Tumor site: Right upper lobe. Tumor focality: Unifocal Maximum tumor size (cm): 3.4 cm. Histologic type: Adenocarcinoma, bronchioalveolar cell type. Grade: I. Margins: Free of tumor. Visceral pleura invasion: No. Tumor extension: Within lobe. Treatment effect (if treated with neoadjuvant therapy): No. Lymph -Vascular invasion: Present. Lymph nodes: Number examined - 3 ; Number N1 nodes positive - 0 ; Number N2 nodes positive - 1 TNM code: pT2a , pN2, pMX Ancillary studies: EGFR and ALK pending. Non-neoplastic lung: Unremarkable     Assessment / Plan:    Stable following resection of right upper lobe, lung cancer which was found to be EGFR positive adenocarcinoma stage IIIa. Now 3.5  years months postop., No evidence of recurrent disease on CT scan. Plans follow-up in 6 months, CT of the chest ordered by oncology for 6 months.    Grace Isaac 07/29/2016 1:25 PM

## 2016-08-05 ENCOUNTER — Encounter: Payer: Federal, State, Local not specified - PPO | Admitting: Cardiothoracic Surgery

## 2016-09-29 ENCOUNTER — Encounter: Payer: Self-pay | Admitting: Podiatry

## 2016-09-29 ENCOUNTER — Ambulatory Visit (INDEPENDENT_AMBULATORY_CARE_PROVIDER_SITE_OTHER): Payer: Federal, State, Local not specified - PPO | Admitting: Podiatry

## 2016-09-29 VITALS — BP 165/97 | HR 90 | Resp 18

## 2016-09-29 DIAGNOSIS — B351 Tinea unguium: Secondary | ICD-10-CM | POA: Diagnosis not present

## 2016-09-29 DIAGNOSIS — M79676 Pain in unspecified toe(s): Secondary | ICD-10-CM

## 2016-09-29 NOTE — Patient Instructions (Signed)
Today we discussed treatment for the deformed right toenail that may be associated with fungal infection and microtrauma. They're denying any specific problems other than the appearance. Today we just debrided this toenail as well as the other remaining toenails and return as needed. Other options include permanent surgical removal, or oral medication

## 2016-09-29 NOTE — Progress Notes (Signed)
   Subjective:    Patient ID: Alvin Hardy, male    DOB: [birth year], 81 y.o.   MRN: [id number]  HPI     This patient presents today requesting a general foot examination and complaining of toenails that are uncomfortable when he wears shoes. It is most uncomfortable in the right hallux toenail. There is a history of toenail surgery with the margins removed and the left hallux toenail without recurrence. The right hallux toenail was removed for permanent correction, however, the nail has regenerated. He states  the neck right hallux nail only has occasional symptoms  difficulty trimming his toenails and general. He describes approximately 1 year history of Gregary Signs topical medication without any change in appearance of the thickened toenails  history of lung cancer without recurrence, neuropathy, bipolar disorder He denies history of diabetes   Review of Systems  All other systems reviewed and are negative.      Objective:   Physical Exam  Patient appears responsive and orientated 3  Vascular: DP and PT pulses 2/4 bilaterally Capillary reflex immediate bilaterally  Neurological: Sensation to 10 g monofilament wire intact 2/5 right 1/5 left Vibratory sensation nonreactive bilaterally Ankle reflexes reactive bilaterally  Dermatological: No open skin lesions bilaterally The right hallux toenails hypertrophic deformed and club shape Left hallux toenail is hypertrophic with narrowing of the medial lateral borders The remaining toenails have occasional texture and color changes  Musculoskeletal: Hammertoe second right Manual motor testing dorsi flexion, plantar flexion, inversion, eversion 5/5 bilaterally      Assessment & Plan:   Assessment: Satisfactory vascular status Peripheral neuropathy undetermined origin Mycotic toenails Recurrence of deformed right hallux toenail  Plan: Discuss treatment options for the toenails in general and the right hallux toenail  particular. At this time because patient is having minimal if any discomfort in the right hallux toenail I recommended periodic debridement. I informed that the nail could be removed for permanent correction, however, could not assure that it would not regenerate. Patient verbally consents to toenail debridement  The toenails 6-10 were debrided mechanically and electrically without any bleeding  Reappoint at patient's request

## 2016-10-28 ENCOUNTER — Other Ambulatory Visit: Payer: Self-pay | Admitting: Nurse Practitioner

## 2016-12-23 ENCOUNTER — Encounter: Payer: Self-pay | Admitting: Internal Medicine

## 2017-01-17 ENCOUNTER — Other Ambulatory Visit (HOSPITAL_BASED_OUTPATIENT_CLINIC_OR_DEPARTMENT_OTHER): Payer: Federal, State, Local not specified - PPO

## 2017-01-17 ENCOUNTER — Ambulatory Visit (HOSPITAL_COMMUNITY)
Admission: RE | Admit: 2017-01-17 | Discharge: 2017-01-17 | Disposition: A | Discharge status: Home / Self Care | Payer: Federal, State, Local not specified - PPO | Source: Ambulatory Visit | Attending: Internal Medicine | Admitting: Internal Medicine

## 2017-01-17 ENCOUNTER — Encounter (HOSPITAL_COMMUNITY): Payer: Self-pay

## 2017-01-17 DIAGNOSIS — C3411 Malignant neoplasm of upper lobe, right bronchus or lung: Secondary | ICD-10-CM | POA: Insufficient documentation

## 2017-01-17 DIAGNOSIS — Y842 Radiological procedure and radiotherapy as the cause of abnormal reaction of the patient, or of later complication, without mention of misadventure at the time of the procedure: Secondary | ICD-10-CM | POA: Diagnosis not present

## 2017-01-17 DIAGNOSIS — Z902 Acquired absence of lung [part of]: Secondary | ICD-10-CM | POA: Diagnosis not present

## 2017-01-17 DIAGNOSIS — Z85118 Personal history of other malignant neoplasm of bronchus and lung: Secondary | ICD-10-CM

## 2017-01-17 LAB — CBC WITH DIFFERENTIAL/PLATELET
BASO%: 0.8 % (ref 0.0–2.0)
BASOS ABS: 0.1 10*3/uL (ref 0.0–0.1)
EOS%: 2.6 % (ref 0.0–7.0)
Eosinophils Absolute: 0.2 10*3/uL (ref 0.0–0.5)
HCT: 46.5 % (ref 38.4–49.9)
HEMOGLOBIN: 15.7 g/dL (ref 13.0–17.1)
LYMPH#: 1.5 10*3/uL (ref 0.9–3.3)
LYMPH%: 20.2 % (ref 14.0–49.0)
MCH: 29.4 pg (ref 27.2–33.4)
MCHC: 33.8 g/dL (ref 32.0–36.0)
MCV: 87.1 fL (ref 79.3–98.0)
MONO#: 0.7 10*3/uL (ref 0.1–0.9)
MONO%: 10.3 % (ref 0.0–14.0)
NEUT#: 4.7 10*3/uL (ref 1.5–6.5)
NEUT%: 66.1 % (ref 39.0–75.0)
Platelets: 202 10*3/uL (ref 140–400)
RBC: 5.34 10*6/uL (ref 4.20–5.82)
RDW: 13.9 % (ref 11.0–14.6)
WBC: 7.2 10*3/uL (ref 4.0–10.3)
nRBC: 0 % (ref 0–0)

## 2017-01-17 LAB — COMPREHENSIVE METABOLIC PANEL
ALT: 18 U/L (ref 0–55)
AST: 15 U/L (ref 5–34)
Albumin: 4 g/dL (ref 3.5–5.0)
Alkaline Phosphatase: 59 U/L (ref 40–150)
Anion Gap: 8 mEq/L (ref 3–11)
BUN: 13 mg/dL (ref 7.0–26.0)
CHLORIDE: 107 meq/L (ref 98–109)
CO2: 26 mEq/L (ref 22–29)
Calcium: 9.1 mg/dL (ref 8.4–10.4)
Creatinine: 1 mg/dL (ref 0.7–1.3)
EGFR: 69 mL/min/{1.73_m2} — AB (ref >90)
GLUCOSE: 91 mg/dL (ref 70–140)
POTASSIUM: 4.6 meq/L (ref 3.5–5.1)
SODIUM: 142 meq/L (ref 136–145)
Total Bilirubin: 1.57 mg/dL — ABNORMAL HIGH (ref 0.20–1.20)
Total Protein: 6.6 g/dL (ref 6.4–8.3)

## 2017-01-17 MED ORDER — IOPAMIDOL (ISOVUE-300) INJECTION 61%
INTRAVENOUS | Status: AC
  Filled 2017-01-17: qty 75

## 2017-01-17 MED ORDER — IOPAMIDOL (ISOVUE-300) INJECTION 61%
75.0000 mL | Freq: Once | INTRAVENOUS | Status: AC | PRN
  Administered 2017-01-17: 75 mL via INTRAVENOUS

## 2017-01-24 ENCOUNTER — Telehealth: Payer: Self-pay | Admitting: Internal Medicine

## 2017-01-24 ENCOUNTER — Encounter: Payer: Self-pay | Admitting: Internal Medicine

## 2017-01-24 ENCOUNTER — Ambulatory Visit (HOSPITAL_BASED_OUTPATIENT_CLINIC_OR_DEPARTMENT_OTHER): Payer: Federal, State, Local not specified - PPO | Admitting: Internal Medicine

## 2017-01-24 VITALS — BP 130/67 | HR 93 | Temp 98.0°F | Resp 18 | Ht 71.0 in | Wt 189.4 lb

## 2017-01-24 DIAGNOSIS — Z85118 Personal history of other malignant neoplasm of bronchus and lung: Secondary | ICD-10-CM | POA: Diagnosis not present

## 2017-01-24 DIAGNOSIS — C3411 Malignant neoplasm of upper lobe, right bronchus or lung: Secondary | ICD-10-CM

## 2017-01-24 NOTE — Telephone Encounter (Signed)
Gave patient AVS and calender per 01/24/2017 los. [facility] to contact patient.

## 2017-01-24 NOTE — Progress Notes (Signed)
[place] [phone]   [phone]  OFFICE PROGRESS NOTE  Jerlyn Ly, MD [place] [address]  DIAGNOSIS AND STAGE: Stage III A (T2a, N2, M0) non-small cell lung cancer, adenocarcinoma with positive EGFR mutation in exon 19 and negative ALK gene translocation diagnosed in April 2014   PRIOR THERAPY:  1) Status post right upper lobectomy with lymph node dissection under the care of Dr. Servando Snare on 02/21/2013. 2) Systemic adjuvant chemotherapy with carboplatin for AUC of 5 and Alimta 500 mg/M2 every 3 weeks. Status post 4 cycles. Last cycle was given on 05/29/2013. (Carboplatin was used in place of cisplatin secondary to concern about intolerability and significant adverse effects of cisplatin).  3) Adjuvant radiotherapy to the mediastinum under the care of Dr. Lisbeth Renshaw completed 08/27/2013.   CURRENT THERAPY: Observation  CHEMOTHERAPY INTENT: adjuvant/curative  CURRENT # OF CHEMOTHERAPY CYCLES: 0 CURRENT ANTIEMETICS: Zofran, Decadron and Compazine  CURRENT SMOKING STATUS: currently a nonsmoker  ORAL CHEMOTHERAPY AND CONSENT: None  CURRENT BISPHOSPHONATES USE: None  PAIN MANAGEMENT: no pain  NARCOTICS INDUCED CONSTIPATION: N/A  LIVING WILL AND CODE STATUS: Full Code.   INTERVAL HISTORY: Alvin Hardy 82 y.o. male returns to the clinic today for six-month follow-up visit. The patient is doing fine today with no specific complaints. He denied having any chest pain, shortness breath, cough or hemoptysis. He has no fever or chills. He denied having any weight loss or night sweats. He has been observation for the last 4 years and doing well. He had repeat CT scan of the chest performed recently and he is here for evaluation and discussion of his scan results.  MEDICAL HISTORY: Past Medical History:  Diagnosis Date  . Allergy   . Arthritis    psoriatic arthritis, ? , treated /w mmethotrexate   . Bipolar 1 disorder ([place])   .  BPH (benign prostatic hyperplasia)   . Cataract 2010   Bilateral  . Chest pain   . Depression   . Deviated septum    TO THE LEFT  . ED (erectile dysfunction)   . Finger injury    mallet finger-- 02/13/2013, cast in place, followed by dr. Fredna Dow  . GERD (gastroesophageal reflux disease)   . Gilbert's syndrome   . History of radiation therapy 07/24/13-08/27/13   50Gy/25 fx chest  . Hyperlipidemia   . Hypogonadism male   . Laryngopharyngeal reflux   . lung ca dx'd 12/2012   rul  . Lung mass 02/07/13   RIGHT UPPER LOBE  . Morton's neuroma    LEFT FOOT  . Neuromuscular disorder ([place])    peripheral neuropathy  . Shortness of breath    voice breaks, ? related to reflux, although told by Dr. Ardis Hughs, S.- early 18 yr. old    ALLERGIES:  is allergic to codeine; decadron [dexamethasone]; other; statins; zetia [ezetimibe]; and zoloft [sertraline hcl].  MEDICATIONS:  Current Outpatient Prescriptions  Medication Sig Dispense Refill  . aspirin EC 81 MG tablet Take 81 mg by mouth every morning.     . Calcium Carbonate-Vitamin D (CALTRATE 600+D) 600-400 MG-UNIT per tablet Take 1 tablet by mouth every morning.    . Cholecalciferol (VITAMIN D-3) 1000 UNITS CAPS Take 1,000 Units by mouth every morning.     . clobetasol cream (TEMOVATE) 6.50 % Apply 1 application topically daily. Reported on 01/27/2016    . clonazePAM (KLONOPIN) 0.5 MG tablet Take 0.5 mg by mouth at bedtime.    Marland Kitchen  cyanocobalamin 1000 MCG tablet Take 1,000 mcg by mouth every morning.     Marland Kitchen esomeprazole (NEXIUM) 40 MG capsule Take 40 mg by mouth daily before breakfast.    . finasteride (PROSCAR) 5 MG tablet Take 5 mg by mouth daily before breakfast.     . gabapentin (NEURONTIN) 300 MG capsule Take 600-900 mg by mouth 2 (two) times daily. He takes two capsules every morning and three capsules at bedtime.    Marland Kitchen lithium carbonate (LITHOBID) 300 MG CR tablet Take 600 mg by mouth at bedtime.     . Multiple Vitamin (MULTIVITAMIN WITH MINERALS)  TABS tablet Take 1 tablet by mouth every morning.    Marland Kitchen OVER THE COUNTER MEDICATION Take 1 tablet by mouth 2 (two) times daily. Glucosamine Chondroitin 1524m/1200mg    . Probiotic Product (PROBIOTIC DAILY PO) Take 1 each by mouth daily.    . propranolol (INDERAL) 10 MG tablet Take 10-20 mg by mouth daily as needed (For extremely stressful circumstances.).     .Marland KitchenREPATHA SURECLICK 1132MG/ML SOAJ every 30 (thirty) days.     . tamsulosin (FLOMAX) 0.4 MG CAPS capsule Take 0.4 mg by mouth daily.  3  . testosterone cypionate (DEPOTESTOTERONE CYPIONATE) 200 MG/ML injection Inject 0.5 mLs into the muscle every 28 (twenty-eight) days.   1  . vardenafil (LEVITRA) 5 MG tablet Take 5 mg by mouth daily as needed for erectile dysfunction.      No current facility-administered medications for this visit.     SURGICAL HISTORY:  Past Surgical History:  Procedure Laterality Date  . CYSTOSCOPY N/A 02/21/2013   Procedure: CYSTOSCOPY FLEXIBLE with insertion of foley catheter;  Surgeon: MFredricka Bonine MD;  [facility]  Service: Urology;  Laterality: N/A;  . EYE SURGERY     cataracts removed fr. both eyes, IOL in place   . KNEE ARTHROSCOPY Right 01/2007  . PILONIDAL CYST EXCISION  1960's  . [place]  TURP  . VIDEO ASSISTED THORACOSCOPY (VATS)/WEDGE RESECTION Right 02/21/2013   Procedure: Right VIDEO ASSISTED THORACOSCOPY ,Thoracotomy with right upper lobectomy, node sampling ;  Surgeon: EGrace Isaac MD;  [facility]  Service: Thoracic;  Laterality: Right;  .Marland KitchenVIDEO BRONCHOSCOPY N/A 02/21/2013   Procedure: VIDEO BRONCHOSCOPY;  Surgeon: EGrace Isaac MD;  [facility];  Service: Thoracic;  Laterality: N/A;    REVIEW OF SYSTEMS:  A comprehensive review of systems was negative.   PHYSICAL EXAMINATION: General appearance: alert, cooperative and no distress Head: Normocephalic, without obvious abnormality, atraumatic Neck: no adenopathy Lymph nodes: Cervical,  supraclavicular, and axillary nodes normal. Resp: clear to auscultation bilaterally Back: symmetric, no curvature. ROM normal. No CVA tenderness. Cardio: regular rate and rhythm, S1, S2 normal, no murmur, click, rub or gallop GI: soft, non-tender; bowel sounds normal; no masses,  no organomegaly Extremities: extremities normal, atraumatic, no cyanosis or edema  ECOG PERFORMANCE STATUS: 0 - Asymptomatic  Blood pressure 130/67, pulse 93, temperature 98 F (36.7 C), temperature source Oral, resp. rate 18, height _0  (1.803 m), weight 189 lb 6.4 oz (85.9 kg), SpO2 99 %.  LABORATORY DATA: Lab Results  Component Value Date   WBC 7.2 01/17/2017   HGB 15.7 01/17/2017   HCT 46.5 01/17/2017   MCV 87.1 01/17/2017   PLT 202 01/17/2017      Chemistry      Component Value Date/Time   NA 142 01/17/2017 1125   K 4.6 01/17/2017 1125   CL 100 09/07/2013  1145   CL 107 04/24/2013 1110   CO2 26 01/17/2017 1125   BUN 13.0 01/17/2017 1125   CREATININE 1.0 01/17/2017 1125      Component Value Date/Time   CALCIUM 9.1 01/17/2017 1125   ALKPHOS 59 01/17/2017 1125   AST 15 01/17/2017 1125   ALT 18 01/17/2017 1125   BILITOT 1.57 (H) 01/17/2017 1125       RADIOGRAPHIC STUDIES: Ct Chest W Contrast  Result Date: 01/17/2017 CLINICAL DATA:  Lung cancer diagnosed 2014, status post right lung surgery, chemotherapy and XRT complete EXAM: CT CHEST WITH CONTRAST TECHNIQUE: Multidetector CT imaging of the chest was performed during intravenous contrast administration. CONTRAST:  82m ISOVUE-300 IOPAMIDOL (ISOVUE-300) INJECTION 61% COMPARISON:  07/19/2016 FINDINGS: Cardiovascular: Heart is normal in size.  No pericardial effusion. Mild atherosclerotic calcifications of the aortic arch. Mediastinum/Nodes: No suspicious mediastinal lymphadenopathy. Visualized thyroid is unremarkable. Lungs/Pleura: Status post right upper lobectomy. Radiation changes in right paramediastinal/perihilar region. Stable 2.0 cm  ground-glass nodular opacity in the right lung apex (series 5/ image 35), favored to reflect radiation changes versus scarring. Left lung is clear. No focal consolidation. Trace right pleural fluid.  No pneumothorax. Upper Abdomen: 1.8 cm hyperdense/ hemorrhagic posterior left upper pole renal cyst (series 2/ image 170). Musculoskeletal: Degenerative changes of the visualized thoracolumbar spine. IMPRESSION: Status post right upper lobectomy. Radiation changes in the right paramediastinal/perihilar region. No evidence of recurrent or metastatic disease. Electronically Signed   By: SJulian HyM.D.   On: 01/17/2017 15:31   ASSESSMENT AND PLAN: This is a very pleasant 82years old white male with history of stage IIIa non-small cell lung cancer, adenocarcinoma with positive EGFR mutation in exon 19 status post right upper lobectomy with lymph node dissection followed by 4 cycles of adjuvant systemic chemotherapy followed by adjuvant radiation to the mediastinum. He has been doing fine with no evidence for disease recurrence since his last treatment. His recent CT scan of the chest showed no concerning findings. I recommended for the patient to continue on observation with repeat CT scan of the chest as well as blood work in one year. For the elevated serum bilirubin, the patient has history of Gilbert syndrome. We will continue to monitor. He was advised to call immediately if he has any concerning findings in the interval. The patient voices understanding of current disease status and treatment options and is in agreement with the current care plan.  All questions were answered. The patient knows to call the clinic with any problems, questions or concerns. We can certainly see the patient much sooner if necessary. I spent 10 minutes counseling the patient face to face. The total time spent in the appointment was 15 minutes.  Disclaimer: This note was dictated with voice recognition software.  Similar sounding words can inadvertently be transcribed and may not be corrected upon review.

## 2017-01-27 ENCOUNTER — Ambulatory Visit (INDEPENDENT_AMBULATORY_CARE_PROVIDER_SITE_OTHER): Payer: Federal, State, Local not specified - PPO | Admitting: Cardiothoracic Surgery

## 2017-01-27 ENCOUNTER — Encounter: Payer: Self-pay | Admitting: Cardiothoracic Surgery

## 2017-01-27 VITALS — BP 144/95 | HR 94 | Resp 16 | Ht 71.0 in | Wt 189.0 lb

## 2017-01-27 DIAGNOSIS — Z902 Acquired absence of lung [part of]: Secondary | ICD-10-CM

## 2017-01-27 DIAGNOSIS — Z85118 Personal history of other malignant neoplasm of bronchus and lung: Secondary | ICD-10-CM | POA: Diagnosis not present

## 2017-01-27 NOTE — Progress Notes (Signed)
[address]       [place] [address]             [phone]                 Jmarion W Rasp [facility] Medical Record #[id number] Date of Birth: May 21, 1935  Alvin Bears, MD Jerlyn Ly, MD  Chief Complaint:   PostOp Follow Up Visit  02/21/2013   PREOPERATIVE DIAGNOSIS: Right upper lobe lung mass suspicious for  malignancy.  POSTOPERATIVE DIAGNOSIS: Right upper lobe lung mass suspicious for  malignancy. 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  PROCEDURE PERFORMED: Bronchoscopy, right upper lobe. Bronchoscopy  right video-assisted thoracoscopy, mini thoracotomy and right upper lobe  resection with lymph node dissection and placement of On-Q device.  SURGEON: Lanelle Bal, MD   Stage III A (T2 A., N2, M0) non-small cell lung cancer, adenocarcinoma with positive EGFR mutation in exon 19 and negative ALK gene translocation diagnosed in April 2014   Treatment :  1) Status post right upper lobectomy with lymph node dissection   02/21/2013.  2) Systemic adjuvant chemotherapy with carboplatin for AUC of 5 and Alimta 500 mg/M2 every 3 weeks. Status post 4 cycles. Last cycle was given on 05/29/2013. (Carboplatin was used in place of cisplatin secondary to concern about intolerability and significant adverse effects of cisplatin). Dr Julien Nordmann 3) Adjuvant radiotherapy to the mediastinum under the care of Dr. Lisbeth Renshaw completed 08/27/2013.      History of Present Illness:     Patient returns now 4 years  postoperatively. He is return to normal activities without limitations. No hemoptysis.  Recent CT scan of the chest was done. Patient plans on celebrating his [age] birthday traveling to [place] and [place].    History  Smoking Status  . Former Smoker  . Types: Cigarettes  . Quit date: 11/01/1978  Smokeless Tobacco  . Never Used       Allergies  Allergen Reactions  . Codeine Anaphylaxis  . Decadron  [Dexamethasone]     Pt says it is contraindication due to the lithium he is taking  . Other     Pt. Remarks that all pain med. Make him nauseated   . Statins     Lipitor, Crestor, Zocor all caused muscle aches  . Zetia [Ezetimibe]     Muscle aches  . Zoloft [Sertraline Hcl] Other (See Comments)    REACTION:  unknown    Current Outpatient Prescriptions  Medication Sig Dispense Refill  . aspirin EC 81 MG tablet Take 81 mg by mouth every morning.     . Calcium Carbonate-Vitamin D (CALTRATE 600+D) 600-400 MG-UNIT per tablet Take 1 tablet by mouth every morning.    . Cholecalciferol (VITAMIN D-3) 1000 UNITS CAPS Take 1,000 Units by mouth every morning.     . clobetasol cream (TEMOVATE) 7.67 % Apply 1 application topically daily. Reported on 01/27/2016    . clonazePAM (KLONOPIN) 0.5 MG tablet Take 0.5 mg by mouth at bedtime.    . cyanocobalamin 1000 MCG tablet Take 1,000 mcg by mouth every morning.     Marland Kitchen esomeprazole (NEXIUM) 40 MG capsule Take 40 mg by mouth daily before breakfast.    . finasteride (PROSCAR) 5 MG tablet Take 5 mg by mouth daily before breakfast.     . gabapentin (NEURONTIN) 300 MG capsule Take 600-900 mg by mouth 2 (two) times daily. He takes two capsules every  morning and three capsules at bedtime.    Marland Kitchen lithium carbonate (LITHOBID) 300 MG CR tablet Take 600 mg by mouth at bedtime.     . Multiple Vitamin (MULTIVITAMIN WITH MINERALS) TABS tablet Take 1 tablet by mouth every morning.    Marland Kitchen OVER THE COUNTER MEDICATION Take 1 tablet by mouth 2 (two) times daily. Glucosamine Chondroitin 1574m/1200mg    . Probiotic Product (PROBIOTIC DAILY PO) Take 1 each by mouth daily.    . propranolol (INDERAL) 10 MG tablet Take 10-20 mg by mouth daily as needed (For extremely stressful circumstances.).     .Marland KitchenREPATHA SURECLICK 1438MG/ML SOAJ every 30 (thirty) days.     . tamsulosin (FLOMAX) 0.4 MG CAPS capsule Take 0.4 mg by mouth daily.  3  . testosterone cypionate (DEPOTESTOTERONE CYPIONATE)  200 MG/ML injection Inject 0.5 mLs into the muscle every 28 (twenty-eight) days.   1  . vardenafil (LEVITRA) 5 MG tablet Take 5 mg by mouth daily as needed for erectile dysfunction.      No current facility-administered medications for this visit.        Physical Exam: BP (!) 144/95 (BP Location: Right Arm, Patient Position: Sitting, Cuff Size: Large)   Pulse 94   Resp 16   Ht '5\' 11"'  (1.803 m)   Wt 189 lb (85.7 kg)   SpO2 99% Comment: ON RA  BMI 26.36 kg/m   Wt Readings from Last 3 Encounters:  01/27/17 189 lb (85.7 kg)  01/24/17 189 lb 6.4 oz (85.9 kg)  07/29/16 189 lb (85.7 kg)   General appearance: alert, cooperative and no distress Neurologic: intact Heart: regular rate and rhythm, S1, S2 normal, no murmur, click, rub or gallop and normal apical impulse no carotid bruits  Lungs: clear to auscultation bilaterally  no stridor on exam Abdomen: soft, non-tender; bowel sounds normal; no masses,  no organomegaly Extremities: extremities normal, atraumatic, no  edema  no sign of DVT Wound: The right chest wall incisions are all well-healed Lymph nodes :Patient has no cervical or supraclavicular adenopathy  ,Diagnostic Studies & Laboratory data:         Recent Radiology Findings:  Ct Chest W Contrast  Result Date: 01/17/2017 CLINICAL DATA:  Lung cancer diagnosed 2014, status post right lung surgery, chemotherapy and XRT complete EXAM: CT CHEST WITH CONTRAST TECHNIQUE: Multidetector CT imaging of the chest was performed during intravenous contrast administration. CONTRAST:  772mISOVUE-300 IOPAMIDOL (ISOVUE-300) INJECTION 61% COMPARISON:  07/19/2016 FINDINGS: Cardiovascular: Heart is normal in size.  No pericardial effusion. Mild atherosclerotic calcifications of the aortic arch. Mediastinum/Nodes: No suspicious mediastinal lymphadenopathy. Visualized thyroid is unremarkable. Lungs/Pleura: Status post right upper lobectomy. Radiation changes in right paramediastinal/perihilar  region. Stable 2.0 cm ground-glass nodular opacity in the right lung apex (series 5/ image 35), favored to reflect radiation changes versus scarring. Left lung is clear. No focal consolidation. Trace right pleural fluid.  No pneumothorax. Upper Abdomen: 1.8 cm hyperdense/ hemorrhagic posterior left upper pole renal cyst (series 2/ image 170). Musculoskeletal: Degenerative changes of the visualized thoracolumbar spine. IMPRESSION: Status post right upper lobectomy. Radiation changes in the right paramediastinal/perihilar region. No evidence of recurrent or metastatic disease. Electronically Signed   By: SrJulian Hy.D.   On: 01/17/2017 15:31   I have independently reviewed the above radiology studies  and reviewed the findings with the patient. I agree with radiology report but am concerned the the right upper lobe ground glass nodularity is slightly more solid then 6 months ago.  Ct Chest W Contrast  Result Date: 07/19/2016 CLINICAL DATA:  Malignant neoplasm of right upper lobe. Restaging. Partial right-sided resection. Chemotherapy and radiation therapy completed 08/27/2013. EXAM: CT CHEST WITH CONTRAST TECHNIQUE: Multidetector CT imaging of the chest was performed during intravenous contrast administration. CONTRAST:  27m ISOVUE-300 IOPAMIDOL (ISOVUE-300) INJECTION 61% COMPARISON:  01/26/2016 FINDINGS: Cardiovascular: Aortic and branch vessel atherosclerosis. Tortuous thoracic aorta. Normal heart size, without pericardial effusion. No central pulmonary embolism, on this non-dedicated study. Mediastinum/Nodes: No supraclavicular adenopathy. No mediastinal or hilar adenopathy. Lungs/Pleura: Trace right-sided pleural fluid or thickening are similar. Status post right upper lobectomy. Similar configuration of right paramediastinal radiation fibrosis. Presumed scarring in the right lower lobe on image 85/ series 5 is unchanged. Mild right apical ground-glass opacity is not significantly changed on  image 27/series 5 and also favored to represent scarring. Upper Abdomen: Normal imaged portions of the liver, spleen, stomach, pancreas, adrenal glands. Incompletely imaged left renal lesion measures 1.8 cm and greater than fluid density, similar to on the prior exam. Musculoskeletal: Moderate thoracic spondylosis. No focal osseous lesion. IMPRESSION: 1. Status post right upper lobectomy. No evidence of recurrent or metastatic disease. 2. Incompletely imaged left renal lesion is again favored to represent a complex cyst, when compared to prior pre and postcontrast exams. Electronically Signed   By: KAbigail MiyamotoM.D.   On: 07/19/2016 11:59   I have independently reviewed the above radiology studies  and reviewed the findings with the patient.  Ct Chest W Contrast  07/22/2015   CLINICAL DATA:  Subsequent evaluation of a 82year old male with history of lung cancer diagnosed in March 2014 status post prior lung surgery as well as chemotherapy and radiation therapy now complete.  EXAM: CT CHEST WITH CONTRAST  TECHNIQUE: Multidetector CT imaging of the chest was performed during intravenous contrast administration.  CONTRAST:  717mOMNIPAQUE IOHEXOL 300 MG/ML  SOLN  COMPARISON:  Chest CT 01/14/2015.  FINDINGS: Mediastinum/Lymph Nodes: Heart size is normal. There is no significant pericardial fluid, thickening or pericardial calcification. No pathologically enlarged mediastinal or hilar lymph nodes. Esophagus is unremarkable in appearance. No axillary lymphadenopathy.  Lungs/Pleura: Status post right upper lobectomy. Extensive architectural distortion noted in the paramediastinal aspect of the remaining portions of the right middle and lower lobes, with a few patchy areas of surrounding ground-glass attenuation, similar to prior studies, most compatible with chronic postradiation changes. There is again a 10 x 5 mm area of nodular thickening of an accessory fissure in the right lower lobe posteriorly (image 37 of  series 5), which is stable in appearance compared to prior study 07/11/2014, favored to represent an area of chronic scarring. No other suspicious appearing pulmonary nodules or masses are identified on today's examination. No acute consolidative airspace disease. No pleural effusions.  Upper Abdomen: Exophytic incompletely visualized intermediate attenuation (29 HU) lesion in the upper pole of the left kidney is incompletely characterize, but is similar to prior studies, favored to represent a proteinaceous cyst.  Musculoskeletal/Soft Tissues: There are no aggressive appearing lytic or blastic lesions noted in the visualized portions of the skeleton.  IMPRESSION: 1. Stable examination demonstrating postoperative and postradiation changes in the right hemithorax, as above. No definite findings to suggest residual/ recurrent disease or metastatic disease in the thorax. 2. Additional incidental findings, as above.   Electronically Signed   By: DaVinnie Langton.D.   On: 07/22/2015 15:44   Recent Labs: Lab Results  Component Value Date   WBC 7.2 01/17/2017   HGB 15.7  01/17/2017   HCT 46.5 01/17/2017   PLT 202 01/17/2017   GLUCOSE 91 01/17/2017   CHOL 206 (H) 06/19/2014   TRIG 216.0 (H) 06/19/2014   HDL 38.50 (L) 06/19/2014   LDLDIRECT 144.0 06/19/2014   ALT 18 01/17/2017   AST 15 01/17/2017   NA 142 01/17/2017   K 4.6 01/17/2017   CL 100 09/07/2013   CREATININE 1.0 01/17/2017   BUN 13.0 01/17/2017   CO2 26 01/17/2017   INR 0.99 02/20/2013   PATH: stage IIIA,  EGFR: Exon 19 deletion  Mutation detected Diagnosis 1. Lung, resection (segmental or lobe), Right upper lobe - ADENOCARCINOMA, 3.4 CM. - MARGINS NOT INVOLVED. - VISCERAL PLEURA FREE OF TUMOR. - ONE BENIGN LYMPH NODE. 2. Lymph node, biopsy, Right upper 4 R - METASTATIC ADENOCARCINOMA. 3. Lymph node, biopsy, Right 2 R - ANTHRACOTIC LYMPH NODE. NO TUMOR IDENTIFIED. Microscopic Comment 1. LUNG Specimen, including laterality: Right  lung lobe. Procedure: Lobectomy. Specimen integrity (intact/disrupted): Intact. Tumor site: Right upper lobe. Tumor focality: Unifocal Maximum tumor size (cm): 3.4 cm. Histologic type: Adenocarcinoma, bronchioalveolar cell type. Grade: I. Margins: Free of tumor. Visceral pleura invasion: No. Tumor extension: Within lobe. Treatment effect (if treated with neoadjuvant therapy): No. Lymph -Vascular invasion: Present. Lymph nodes: Number examined - 3 ; Number N1 nodes positive - 0 ; Number N2 nodes positive - 1 TNM code: pT2a , pN2, pMX Ancillary studies: EGFR and ALK pending. Non-neoplastic lung: Unremarkable     Assessment / Plan:    Stable following resection of right upper lobe, lung cancer which was found to be EGFR positive adenocarcinoma stage IIIa. Now 4 years months postop., No evidence of recurrent disease on CT scan. He does have very slight change in right upper lobe nodular area Plans follow-up in 6 months,  With CT of the chest  Oncology has ordered scan for one year    Grace Isaac 01/27/2017 9:52 AM

## 2017-06-02 ENCOUNTER — Other Ambulatory Visit: Payer: Self-pay | Admitting: Internal Medicine

## 2017-06-02 DIAGNOSIS — M545 Low back pain: Secondary | ICD-10-CM

## 2017-06-12 ENCOUNTER — Ambulatory Visit
Admission: RE | Admit: 2017-06-12 | Discharge: 2017-06-12 | Disposition: A | Discharge status: Home / Self Care | Payer: Federal, State, Local not specified - PPO | Source: Ambulatory Visit | Attending: Internal Medicine | Admitting: Internal Medicine

## 2017-06-12 DIAGNOSIS — M545 Low back pain: Secondary | ICD-10-CM

## 2017-06-16 ENCOUNTER — Other Ambulatory Visit: Payer: Self-pay | Admitting: *Deleted

## 2017-06-17 ENCOUNTER — Other Ambulatory Visit: Payer: Self-pay | Admitting: *Deleted

## 2017-06-17 DIAGNOSIS — Z122 Encounter for screening for malignant neoplasm of respiratory organs: Secondary | ICD-10-CM

## 2017-06-17 DIAGNOSIS — C3411 Malignant neoplasm of upper lobe, right bronchus or lung: Secondary | ICD-10-CM

## 2017-06-28 DIAGNOSIS — M48062 Spinal stenosis, lumbar region with neurogenic claudication: Secondary | ICD-10-CM | POA: Insufficient documentation

## 2017-06-28 DIAGNOSIS — M4800 Spinal stenosis, site unspecified: Secondary | ICD-10-CM | POA: Insufficient documentation

## 2017-07-21 ENCOUNTER — Other Ambulatory Visit: Payer: Federal, State, Local not specified - PPO

## 2017-07-21 ENCOUNTER — Ambulatory Visit
Admission: RE | Admit: 2017-07-21 | Discharge: 2017-07-21 | Disposition: A | Discharge status: Home / Self Care | Payer: Federal, State, Local not specified - PPO | Source: Ambulatory Visit | Attending: Cardiothoracic Surgery | Admitting: Cardiothoracic Surgery

## 2017-07-21 ENCOUNTER — Ambulatory Visit (INDEPENDENT_AMBULATORY_CARE_PROVIDER_SITE_OTHER): Payer: Federal, State, Local not specified - PPO | Admitting: Cardiothoracic Surgery

## 2017-07-21 ENCOUNTER — Encounter: Payer: Self-pay | Admitting: Cardiothoracic Surgery

## 2017-07-21 VITALS — BP 126/78 | HR 85 | Resp 18 | Ht 71.0 in | Wt 189.0 lb

## 2017-07-21 DIAGNOSIS — R911 Solitary pulmonary nodule: Secondary | ICD-10-CM

## 2017-07-21 DIAGNOSIS — Z902 Acquired absence of lung [part of]: Secondary | ICD-10-CM

## 2017-07-21 DIAGNOSIS — Z122 Encounter for screening for malignant neoplasm of respiratory organs: Secondary | ICD-10-CM

## 2017-07-21 DIAGNOSIS — Z85118 Personal history of other malignant neoplasm of bronchus and lung: Secondary | ICD-10-CM | POA: Diagnosis not present

## 2017-07-21 DIAGNOSIS — C3411 Malignant neoplasm of upper lobe, right bronchus or lung: Secondary | ICD-10-CM

## 2017-07-21 NOTE — Progress Notes (Signed)
[address]       [place] [address]             [phone]                 Jamol W Crumbley [place] Medical Record #[id number] Date of Birth: 1935-06-01  Alvin Bears, MD Crist Infante, MD  Chief Complaint:   PostOp Follow Up Visit  02/21/2013   PREOPERATIVE DIAGNOSIS: Right upper lobe lung mass suspicious for  malignancy.  POSTOPERATIVE DIAGNOSIS: Right upper lobe lung mass suspicious for  malignancy. 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  PROCEDURE PERFORMED: Bronchoscopy, right upper lobe. Bronchoscopy  right video-assisted thoracoscopy, mini thoracotomy and right upper lobe  resection with lymph node dissection and placement of On-Q device.  SURGEON: Lanelle Bal, MD   Stage III A (T2 A., N2, M0) non-small cell lung cancer, adenocarcinoma with positive EGFR mutation in exon 19 and negative ALK gene translocation diagnosed in April 2014   Treatment :  1) Status post right upper lobectomy with lymph node dissection   02/21/2013.  2) Systemic adjuvant chemotherapy with carboplatin for AUC of 5 and Alimta 500 mg/M2 every 3 weeks. Status post 4 cycles. Last cycle was given on 05/29/2013. (Carboplatin was used in place of cisplatin secondary to concern about intolerability and significant adverse effects of cisplatin). Dr Julien Nordmann 3) Adjuvant radiotherapy to the mediastinum under the care of Dr. Lisbeth Renshaw completed 08/27/2013.      History of Present Illness:     Patient returns now for a half years  postoperatively. When seen 6 months ago groundglass opacity in the right upper lobe appears slightly more dense, I requested the patient have a repeat CT scan in 6 months later comes into the office today with that scan done.  History  Smoking Status  . Former Smoker  . Types: Cigarettes  . Quit date: 11/01/1978  Smokeless Tobacco  . Never Used       Allergies  Allergen Reactions  . Codeine Anaphylaxis  .  Decadron [Dexamethasone]     Pt says it is contraindication due to the lithium he is taking  . Other     Pt. Remarks that all pain med. Make him nauseated   . Statins     Lipitor, Crestor, Zocor all caused muscle aches  . Zetia [Ezetimibe]     Muscle aches  . Zoloft [Sertraline Hcl] Other (See Comments)    REACTION:  unknown    Current Outpatient Prescriptions  Medication Sig Dispense Refill  . aspirin EC 81 MG tablet Take 81 mg by mouth every morning.     . Calcium Carbonate-Vitamin D (CALTRATE 600+D) 600-400 MG-UNIT per tablet Take 1 tablet by mouth every morning.    . Cholecalciferol (VITAMIN D-3) 1000 UNITS CAPS Take 1,000 Units by mouth every morning.     . clobetasol cream (TEMOVATE) 3.88 % Apply 1 application topically daily. Reported on 01/27/2016    . clonazePAM (KLONOPIN) 0.5 MG tablet Take 0.5 mg by mouth at bedtime.    . cyanocobalamin 1000 MCG tablet Take 1,000 mcg by mouth every morning.     Marland Kitchen esomeprazole (NEXIUM) 40 MG capsule Take 40 mg by mouth daily before breakfast.    . finasteride (PROSCAR) 5 MG tablet Take 5 mg by mouth daily before breakfast.     . gabapentin (NEURONTIN) 300 MG capsule Take 600-900 mg by mouth 2 (two) times daily.  He takes two capsules every morning and three capsules at bedtime.    Marland Kitchen lithium carbonate (LITHOBID) 300 MG CR tablet Take 600 mg by mouth at bedtime.     . Multiple Vitamin (MULTIVITAMIN WITH MINERALS) TABS tablet Take 1 tablet by mouth every morning.    Marland Kitchen OVER THE COUNTER MEDICATION Take 1 tablet by mouth 2 (two) times daily. Glucosamine Chondroitin 1511m/1200mg    . Probiotic Product (PROBIOTIC DAILY PO) Take 1 each by mouth daily.    . propranolol (INDERAL) 10 MG tablet Take 10-20 mg by mouth daily as needed (For extremely stressful circumstances.).     .Marland KitchenREPATHA SURECLICK 1161MG/ML SOAJ Every 15 days    . tamsulosin (FLOMAX) 0.4 MG CAPS capsule Take 0.4 mg by mouth daily.  3  . testosterone cypionate (DEPOTESTOTERONE CYPIONATE) 200  MG/ML injection Inject 0.5 mLs into the muscle every 21 ( twenty-one) days. Now 3028mml  1  . vardenafil (LEVITRA) 5 MG tablet Take 5 mg by mouth daily as needed for erectile dysfunction.      No current facility-administered medications for this visit.        Physical Exam: BP 126/78 (BP Location: Left Arm, Patient Position: Sitting, Cuff Size: Large)   Pulse 85   Resp 18   Ht '5\' 11"'  (1.803 m)   Wt 189 lb (85.7 kg)   SpO2 96% Comment: RA  BMI 26.36 kg/m   Wt Readings from Last 3 Encounters:  07/21/17 189 lb (85.7 kg)  01/27/17 189 lb (85.7 kg)  01/24/17 189 lb 6.4 oz (85.9 kg)   Physical Exam  Vitals reviewed. Constitutional: He is oriented to person, place, and time. He appears well-developed and well-nourished. No distress.  HENT:  Mouth/Throat: No oropharyngeal exudate.  Eyes: No scleral icterus.  Neck: No JVD present. No tracheal deviation present. No thyromegaly present.  Cardiovascular: Exam reveals no gallop and no friction rub.   No murmur heard. Respiratory: No stridor. No respiratory distress. He has no wheezes. He has no rales. He exhibits no tenderness.  GI: He exhibits no distension and no mass. There is no tenderness.  Musculoskeletal: He exhibits no edema or deformity.  Lymphadenopathy:    He has no cervical adenopathy.  Neurological: He is alert and oriented to person, place, and time. No cranial nerve deficit.  Skin: He is not diaphoretic.  Psychiatric: He has a normal mood and affect. His behavior is normal. Judgment and thought content normal.     ,Diagnostic Studies & Laboratory data:         Recent Radiology Findings: Ct Chest Wo Contrast  Result Date: 07/21/2017 CLINICAL DATA:  History of right upper lobectomy for lung cancer. Follow-up for right apical ground-glass opacity. EXAM: CT CHEST WITHOUT CONTRAST TECHNIQUE: Multidetector CT imaging of the chest was performed following the standard protocol without IV contrast. COMPARISON:  01/17/2017  FINDINGS: Cardiovascular: The heart size is normal. No pericardial effusion. Stable trace pericardial fluid or thickening. Coronary artery calcification is evident. Atherosclerotic calcification is noted in the wall of the thoracic aorta. Mediastinum/Nodes: No mediastinal lymphadenopathy. Surgical changes again noted in the right hilum. No left hilar lymphadenopathy evident on this noncontrast exam. The esophagus has normal imaging features. There is no axillary lymphadenopathy. Lungs/Pleura: Volume loss right hemithorax compatible with reported history of right upper lobectomy. The irregular sub solid opacity in the right apex persists and is similar in appearance. The lesion measures 1.8 cm maximum diameter today compared to 2.0 cm previously. Post radiation fibrosis  identified in the parahilar right lung. No new or progressive interval findings. Upper Abdomen: Exophytic lesion in the upper pole the left kidney is incompletely visualized (image 168 series 2). This has been present since at least 07/22/2015 and is stable in size. Average attenuation of this lesion appears increased in the interval suggesting likely hemorrhage into a pre-existing upper pole cyst. Musculoskeletal: Bone windows reveal no worrisome lytic or sclerotic osseous lesions. IMPRESSION: 1. Stable CT scan of the chest. No new or progressive findings. Specifically, the right apical sub solid opacity is stable. 2. Interval increase in attenuation of an exophytic lesion upper pole left kidney, incompletely visualized. This is been present for more than 3 years and appears today likely reflects hemorrhage into a pre-existing cyst. Addition of abdominal CT to the routine follow-up chest CT may prove helpful to establish a new baseline for this lesion. 3.  Aortic Atherosclerois (ICD10-170.0) Electronically Signed   By: Misty Stanley M.D.   On: 07/21/2017 13:09   Ct Chest W Contrast  Result Date: 01/17/2017 CLINICAL DATA:  Lung cancer diagnosed  2014, status post right lung surgery, chemotherapy and XRT complete EXAM: CT CHEST WITH CONTRAST TECHNIQUE: Multidetector CT imaging of the chest was performed during intravenous contrast administration. CONTRAST:  109m ISOVUE-300 IOPAMIDOL (ISOVUE-300) INJECTION 61% COMPARISON:  07/19/2016 FINDINGS: Cardiovascular: Heart is normal in size.  No pericardial effusion. Mild atherosclerotic calcifications of the aortic arch. Mediastinum/Nodes: No suspicious mediastinal lymphadenopathy. Visualized thyroid is unremarkable. Lungs/Pleura: Status post right upper lobectomy. Radiation changes in right paramediastinal/perihilar region. Stable 2.0 cm ground-glass nodular opacity in the right lung apex (series 5/ image 35), favored to reflect radiation changes versus scarring. Left lung is clear. No focal consolidation. Trace right pleural fluid.  No pneumothorax. Upper Abdomen: 1.8 cm hyperdense/ hemorrhagic posterior left upper pole renal cyst (series 2/ image 170). Musculoskeletal: Degenerative changes of the visualized thoracolumbar spine. IMPRESSION: Status post right upper lobectomy. Radiation changes in the right paramediastinal/perihilar region. No evidence of recurrent or metastatic disease. Electronically Signed   By: SJulian HyM.D.   On: 01/17/2017 15:31   I have independently reviewed the above radiology studies  and reviewed the findings with the patient. I agree with radiology report but am concerned the the right upper lobe ground glass nodularity is slightly more solid then 6 months ago.    Ct Chest W Contrast  Result Date: 07/19/2016 CLINICAL DATA:  Malignant neoplasm of right upper lobe. Restaging. Partial right-sided resection. Chemotherapy and radiation therapy completed 08/27/2013. EXAM: CT CHEST WITH CONTRAST TECHNIQUE: Multidetector CT imaging of the chest was performed during intravenous contrast administration. CONTRAST:  774mISOVUE-300 IOPAMIDOL (ISOVUE-300) INJECTION 61% COMPARISON:   01/26/2016 FINDINGS: Cardiovascular: Aortic and branch vessel atherosclerosis. Tortuous thoracic aorta. Normal heart size, without pericardial effusion. No central pulmonary embolism, on this non-dedicated study. Mediastinum/Nodes: No supraclavicular adenopathy. No mediastinal or hilar adenopathy. Lungs/Pleura: Trace right-sided pleural fluid or thickening are similar. Status post right upper lobectomy. Similar configuration of right paramediastinal radiation fibrosis. Presumed scarring in the right lower lobe on image 85/ series 5 is unchanged. Mild right apical ground-glass opacity is not significantly changed on image 27/series 5 and also favored to represent scarring. Upper Abdomen: Normal imaged portions of the liver, spleen, stomach, pancreas, adrenal glands. Incompletely imaged left renal lesion measures 1.8 cm and greater than fluid density, similar to on the prior exam. Musculoskeletal: Moderate thoracic spondylosis. No focal osseous lesion. IMPRESSION: 1. Status post right upper lobectomy. No evidence of recurrent or metastatic  disease. 2. Incompletely imaged left renal lesion is again favored to represent a complex cyst, when compared to prior pre and postcontrast exams. Electronically Signed   By: Abigail Miyamoto M.D.   On: 07/19/2016 11:59   I have independently reviewed the above radiology studies  and reviewed the findings with the patient.  Ct Chest W Contrast  07/22/2015   CLINICAL DATA:  Subsequent evaluation of a 82 year old male with history of lung cancer diagnosed in March 2014 status post prior lung surgery as well as chemotherapy and radiation therapy now complete.  EXAM: CT CHEST WITH CONTRAST  TECHNIQUE: Multidetector CT imaging of the chest was performed during intravenous contrast administration.  CONTRAST:  30m OMNIPAQUE IOHEXOL 300 MG/ML  SOLN  COMPARISON:  Chest CT 01/14/2015.  FINDINGS: Mediastinum/Lymph Nodes: Heart size is normal. There is no significant pericardial fluid,  thickening or pericardial calcification. No pathologically enlarged mediastinal or hilar lymph nodes. Esophagus is unremarkable in appearance. No axillary lymphadenopathy.  Lungs/Pleura: Status post right upper lobectomy. Extensive architectural distortion noted in the paramediastinal aspect of the remaining portions of the right middle and lower lobes, with a few patchy areas of surrounding ground-glass attenuation, similar to prior studies, most compatible with chronic postradiation changes. There is again a 10 x 5 mm area of nodular thickening of an accessory fissure in the right lower lobe posteriorly (image 37 of series 5), which is stable in appearance compared to prior study 07/11/2014, favored to represent an area of chronic scarring. No other suspicious appearing pulmonary nodules or masses are identified on today's examination. No acute consolidative airspace disease. No pleural effusions.  Upper Abdomen: Exophytic incompletely visualized intermediate attenuation (29 HU) lesion in the upper pole of the left kidney is incompletely characterize, but is similar to prior studies, favored to represent a proteinaceous cyst.  Musculoskeletal/Soft Tissues: There are no aggressive appearing lytic or blastic lesions noted in the visualized portions of the skeleton.  IMPRESSION: 1. Stable examination demonstrating postoperative and postradiation changes in the right hemithorax, as above. No definite findings to suggest residual/ recurrent disease or metastatic disease in the thorax. 2. Additional incidental findings, as above.   Electronically Signed   By: DVinnie LangtonM.D.   On: 07/22/2015 15:44   Recent Labs: Lab Results  Component Value Date   WBC 7.2 01/17/2017   HGB 15.7 01/17/2017   HCT 46.5 01/17/2017   PLT 202 01/17/2017   GLUCOSE 91 01/17/2017   CHOL 206 (H) 06/19/2014   TRIG 216.0 (H) 06/19/2014   HDL 38.50 (L) 06/19/2014   LDLDIRECT 144.0 06/19/2014   ALT 18 01/17/2017   AST 15  01/17/2017   NA 142 01/17/2017   K 4.6 01/17/2017   CL 100 09/07/2013   CREATININE 1.0 01/17/2017   BUN 13.0 01/17/2017   CO2 26 01/17/2017   INR 0.99 02/20/2013   PATH: stage IIIA,  EGFR: Exon 19 deletion  Mutation detected Diagnosis 1. Lung, resection (segmental or lobe), Right upper lobe - ADENOCARCINOMA, 3.4 CM. - MARGINS NOT INVOLVED. - VISCERAL PLEURA FREE OF TUMOR. - ONE BENIGN LYMPH NODE. 2. Lymph node, biopsy, Right upper 4 R - METASTATIC ADENOCARCINOMA. 3. Lymph node, biopsy, Right 2 R - ANTHRACOTIC LYMPH NODE. NO TUMOR IDENTIFIED. Microscopic Comment 1. LUNG Specimen, including laterality: Right lung lobe. Procedure: Lobectomy. Specimen integrity (intact/disrupted): Intact. Tumor site: Right upper lobe. Tumor focality: Unifocal Maximum tumor size (cm): 3.4 cm. Histologic type: Adenocarcinoma, bronchioalveolar cell type. Grade: I. Margins: Free of tumor. Visceral pleura invasion:  No. Tumor extension: Within lobe. Treatment effect (if treated with neoadjuvant therapy): No. Lymph -Vascular invasion: Present. Lymph nodes: Number examined - 3 ; Number N1 nodes positive - 0 ; Number N2 nodes positive - 1 TNM code: pT2a , pN2, pMX Ancillary studies: EGFR and ALK pending. Non-neoplastic lung: Unremarkable     Assessment / Plan:     Stable following resection of right upper lobe, lung cancer which was found to be EGFR positive adenocarcinoma stage IIIa. Dx 2014  No evidence of recurrent disease on CT scan.  Stable CT scan of the chest. No new or progressive findings. Specifically, the right apical sub solid opacity is stable.  Plan to see him back in 6 months, CT scan of the chest has been ordered by Dr. Julien Nordmann . The patient will discuss With oncology adding abdominal ct to next scan to evaluate exophytic lesion left kidney   Grace Isaac 07/21/2017 2:08 PM

## 2017-10-27 ENCOUNTER — Other Ambulatory Visit: Payer: Self-pay | Admitting: Neurosurgery

## 2017-10-27 DIAGNOSIS — M5412 Radiculopathy, cervical region: Secondary | ICD-10-CM

## 2017-10-31 ENCOUNTER — Ambulatory Visit
Admission: RE | Admit: 2017-10-31 | Discharge: 2017-10-31 | Disposition: A | Discharge status: Home / Self Care | Payer: Federal, State, Local not specified - PPO | Source: Ambulatory Visit | Attending: Neurosurgery | Admitting: Neurosurgery

## 2017-10-31 DIAGNOSIS — M5412 Radiculopathy, cervical region: Secondary | ICD-10-CM

## 2017-11-11 DIAGNOSIS — G5620 Lesion of ulnar nerve, unspecified upper limb: Secondary | ICD-10-CM | POA: Insufficient documentation

## 2017-11-11 DIAGNOSIS — M5412 Radiculopathy, cervical region: Secondary | ICD-10-CM | POA: Insufficient documentation

## 2018-01-10 ENCOUNTER — Telehealth: Payer: Self-pay

## 2018-01-10 NOTE — Telephone Encounter (Signed)
Patient came in and got a copy of the calender. He did have some concerns over his MD. Appointment date being a week after labs. But decided to keep the date that was already scheduled. Per 3/12 walk-ins.

## 2018-01-16 ENCOUNTER — Encounter: Payer: Self-pay | Admitting: *Deleted

## 2018-01-19 ENCOUNTER — Ambulatory Visit (HOSPITAL_COMMUNITY)
Admission: RE | Admit: 2018-01-19 | Discharge: 2018-01-19 | Disposition: A | Discharge status: Home / Self Care | Payer: Federal, State, Local not specified - PPO | Source: Ambulatory Visit | Attending: Internal Medicine | Admitting: Internal Medicine

## 2018-01-19 ENCOUNTER — Encounter (HOSPITAL_COMMUNITY): Payer: Self-pay

## 2018-01-19 ENCOUNTER — Inpatient Hospital Stay: Payer: Federal, State, Local not specified - PPO | Attending: Internal Medicine

## 2018-01-19 DIAGNOSIS — Z85118 Personal history of other malignant neoplasm of bronchus and lung: Secondary | ICD-10-CM | POA: Diagnosis present

## 2018-01-19 DIAGNOSIS — C3411 Malignant neoplasm of upper lobe, right bronchus or lung: Secondary | ICD-10-CM

## 2018-01-19 DIAGNOSIS — N289 Disorder of kidney and ureter, unspecified: Secondary | ICD-10-CM | POA: Insufficient documentation

## 2018-01-19 DIAGNOSIS — I700 Atherosclerosis of aorta: Secondary | ICD-10-CM | POA: Diagnosis not present

## 2018-01-19 DIAGNOSIS — I2510 Atherosclerotic heart disease of native coronary artery without angina pectoris: Secondary | ICD-10-CM | POA: Diagnosis not present

## 2018-01-19 LAB — COMPREHENSIVE METABOLIC PANEL
ALK PHOS: 60 U/L (ref 40–150)
ALT: 17 U/L (ref 0–55)
AST: 17 U/L (ref 5–34)
Albumin: 4 g/dL (ref 3.5–5.0)
Anion gap: 7 (ref 3–11)
BUN: 14 mg/dL (ref 7–26)
CALCIUM: 9.3 mg/dL (ref 8.4–10.4)
CO2: 27 mmol/L (ref 22–29)
Chloride: 107 mmol/L (ref 98–109)
Creatinine, Ser: 1.07 mg/dL (ref 0.70–1.30)
GFR calc Af Amer: >60 mL/min (ref >60)
GFR calc non Af Amer: >60 mL/min (ref >60)
GLUCOSE: 96 mg/dL (ref 70–140)
Potassium: 4.4 mmol/L (ref 3.5–5.1)
SODIUM: 141 mmol/L (ref 136–145)
Total Bilirubin: 1.6 mg/dL — ABNORMAL HIGH (ref 0.2–1.2)
Total Protein: 6.7 g/dL (ref 6.4–8.3)

## 2018-01-19 LAB — CBC WITH DIFFERENTIAL/PLATELET
BASOS PCT: 1 %
Basophils Absolute: 0.1 10*3/uL (ref 0.0–0.1)
EOS ABS: 0.3 10*3/uL (ref 0.0–0.5)
EOS PCT: 4 %
HCT: 48.3 % (ref 38.4–49.9)
Hemoglobin: 16 g/dL (ref 13.0–17.1)
Lymphocytes Relative: 22 %
Lymphs Abs: 1.5 10*3/uL (ref 0.9–3.3)
MCH: 29.8 pg (ref 27.2–33.4)
MCHC: 33.1 g/dL (ref 32.0–36.0)
MCV: 90.1 fL (ref 79.3–98.0)
MONO ABS: 0.5 10*3/uL (ref 0.1–0.9)
MONOS PCT: 7 %
Neutro Abs: 4.6 10*3/uL (ref 1.5–6.5)
Neutrophils Relative %: 66 %
Platelets: 195 10*3/uL (ref 140–400)
RBC: 5.36 MIL/uL (ref 4.20–5.82)
RDW: 13.8 % (ref 11.0–14.6)
WBC: 6.9 10*3/uL (ref 4.0–10.3)

## 2018-01-19 MED ORDER — IOPAMIDOL (ISOVUE-300) INJECTION 61%
INTRAVENOUS | Status: AC
  Administered 2018-01-19: 75 mL via INTRAVENOUS
  Filled 2018-01-19: qty 75

## 2018-01-19 MED ORDER — IOPAMIDOL (ISOVUE-300) INJECTION 61%
75.0000 mL | Freq: Once | INTRAVENOUS | Status: AC | PRN
  Administered 2018-01-19: 75 mL via INTRAVENOUS

## 2018-01-26 ENCOUNTER — Telehealth: Payer: Self-pay | Admitting: Internal Medicine

## 2018-01-26 ENCOUNTER — Inpatient Hospital Stay (HOSPITAL_BASED_OUTPATIENT_CLINIC_OR_DEPARTMENT_OTHER): Payer: Federal, State, Local not specified - PPO | Admitting: Internal Medicine

## 2018-01-26 ENCOUNTER — Encounter: Payer: Self-pay | Admitting: Internal Medicine

## 2018-01-26 DIAGNOSIS — Z85118 Personal history of other malignant neoplasm of bronchus and lung: Secondary | ICD-10-CM

## 2018-01-26 DIAGNOSIS — C3490 Malignant neoplasm of unspecified part of unspecified bronchus or lung: Secondary | ICD-10-CM

## 2018-01-26 NOTE — Progress Notes (Signed)
[place] [phone]   [phone]  OFFICE PROGRESS NOTE  Crist Infante, MD [place] [address]  DIAGNOSIS AND STAGE: Stage III A (T2a, N2, M0) non-small cell lung cancer, adenocarcinoma with positive EGFR mutation in exon 19 and negative ALK gene translocation diagnosed in April 2014   PRIOR THERAPY:  1) Status post right upper lobectomy with lymph node dissection under the care of Dr. Servando Snare on 02/21/2013. 2) Systemic adjuvant chemotherapy with carboplatin for AUC of 5 and Alimta 500 mg/M2 every 3 weeks. Status post 4 cycles. Last cycle was given on 05/29/2013. (Carboplatin was used in place of cisplatin secondary to concern about intolerability and significant adverse effects of cisplatin).  3) Adjuvant radiotherapy to the mediastinum under the care of Dr. Lisbeth Renshaw completed 08/27/2013.   CURRENT THERAPY: Observation  CHEMOTHERAPY INTENT: adjuvant/curative  CURRENT # OF CHEMOTHERAPY CYCLES: 0 CURRENT ANTIEMETICS: Zofran, Decadron and Compazine  CURRENT SMOKING STATUS: currently a nonsmoker  ORAL CHEMOTHERAPY AND CONSENT: None  CURRENT BISPHOSPHONATES USE: None  PAIN MANAGEMENT: no pain  NARCOTICS INDUCED CONSTIPATION: N/A  LIVING WILL AND CODE STATUS: Full Code.   INTERVAL HISTORY: Alvin Hardy 83 y.o. male returns to the clinic today for annual follow-up visit.  The patient is feeling fine today with no specific complaints.  He had left carpal tunnel and elbow surgery recently and tolerating it well.  He denied having any chest pain, shortness of breath, cough or hemoptysis.  He denied having any fever or chills.  He has no nausea, vomiting, diarrhea or constipation.  He denied having any significant weight loss or night sweats.  He had repeat CT scan of the chest performed recently and he is here for evaluation and discussion of his scan results.  MEDICAL HISTORY: Past Medical History:  Diagnosis Date  . Allergy     . Arthritis    psoriatic arthritis, ? , treated /w mmethotrexate   . Bipolar 1 disorder ([place])   . BPH (benign prostatic hyperplasia)   . Cataract 2010   Bilateral  . Chest pain   . Depression   . Deviated septum    TO THE LEFT  . ED (erectile dysfunction)   . Finger injury    mallet finger-- 02/13/2013, cast in place, followed by dr. Fredna Dow  . GERD (gastroesophageal reflux disease)   . Gilbert's syndrome   . History of radiation therapy 07/24/13-08/27/13   50Gy/25 fx chest  . Hyperlipidemia   . Hypogonadism male   . Laryngopharyngeal reflux   . lung ca dx'd 12/2012   rul  . Lung mass 02/07/13   RIGHT UPPER LOBE  . Morton's neuroma    LEFT FOOT  . Neuromuscular disorder ([place])    peripheral neuropathy  . Shortness of breath    voice breaks, ? related to reflux, although told by Dr. Ardis Hughs, S.- early 35 yr. old    ALLERGIES:  is allergic to codeine; decadron [dexamethasone]; other; statins; zetia [ezetimibe]; and zoloft [sertraline hcl].  MEDICATIONS:  Current Outpatient Medications  Medication Sig Dispense Refill  . aspirin EC 81 MG tablet Take 81 mg by mouth every morning.     . Calcium Carbonate-Vitamin D (CALTRATE 600+D) 600-400 MG-UNIT per tablet Take 1 tablet by mouth every morning.    . Cholecalciferol (VITAMIN D-3) 1000 UNITS CAPS Take 1,000 Units by mouth every morning.     . clobetasol cream (TEMOVATE) 0.27 % Apply 1 application topically daily. Reported on 01/27/2016    .  clonazePAM (KLONOPIN) 0.5 MG tablet Take 0.5 mg by mouth at bedtime.    . cyanocobalamin 1000 MCG tablet Take 1,000 mcg by mouth every morning.     Marland Kitchen esomeprazole (NEXIUM) 40 MG capsule Take 40 mg by mouth daily before breakfast.    . finasteride (PROSCAR) 5 MG tablet Take 5 mg by mouth daily before breakfast.     . gabapentin (NEURONTIN) 300 MG capsule Take 600-900 mg by mouth 2 (two) times daily. He takes two capsules every morning and three capsules at bedtime.    Marland Kitchen lithium carbonate (LITHOBID)  300 MG CR tablet Take 600 mg by mouth at bedtime.     . Multiple Vitamin (MULTIVITAMIN WITH MINERALS) TABS tablet Take 1 tablet by mouth every morning.    Marland Kitchen OVER THE COUNTER MEDICATION Take 1 tablet by mouth 2 (two) times daily. Glucosamine Chondroitin 1542m/1200mg    . Probiotic Product (PROBIOTIC DAILY PO) Take 1 each by mouth daily.    . propranolol (INDERAL) 10 MG tablet Take 10-20 mg by mouth daily as needed (For extremely stressful circumstances.).     .Marland KitchenREPATHA SURECLICK 1800MG/ML SOAJ Every 15 days    . tamsulosin (FLOMAX) 0.4 MG CAPS capsule Take 0.4 mg by mouth daily.  3  . testosterone cypionate (DEPOTESTOTERONE CYPIONATE) 200 MG/ML injection Inject 0.5 mLs into the muscle every 21 ( twenty-one) days. Now 3017mml  1  . vardenafil (LEVITRA) 5 MG tablet Take 5 mg by mouth daily as needed for erectile dysfunction.      No current facility-administered medications for this visit.     SURGICAL HISTORY:  Past Surgical History:  Procedure Laterality Date  . CYSTOSCOPY N/A 02/21/2013   Procedure: CYSTOSCOPY FLEXIBLE with insertion of foley catheter;  Surgeon: MaFredricka BonineMD;  [facility] Service[facility] Urology;  Laterality: N/A;  . EYE SURGERY     cataracts removed fr. both eyes, IOL in place   . KNEE ARTHROSCOPY Right 01/2007  . PILONIDAL CYST EXCISION  1960's  . [facility] TURP  . VIDEO ASSISTED THORACOSCOPY (VATS)/WEDGE RESECTION Right 02/21/2013   Procedure: Right VIDEO ASSISTED THORACOSCOPY ,Thoracotomy with right upper lobectomy, node sampling ;  Surgeon: EdGrace IsaacMD;  [facility] Thoracic;  Laterality: Right;  . Marland KitchenIDEO BRONCHOSCOPY N/A 02/21/2013   Procedure: VIDEO BRONCHOSCOPY;  Surgeon: EdGrace IsaacMD;  [facility];  Service: Thoracic;  Laterality: N/A;    REVIEW OF SYSTEMS:  A comprehensive review of systems was negative.   PHYSICAL EXAMINATION: General appearance: alert, cooperative and no distress Head:  Normocephalic, without obvious abnormality, atraumatic Neck: no adenopathy Lymph nodes: Cervical, supraclavicular, and axillary nodes normal. Resp: clear to auscultation bilaterally Back: symmetric, no curvature. ROM normal. No CVA tenderness. Cardio: regular rate and rhythm, S1, S2 normal, no murmur, click, rub or gallop GI: soft, non-tender; bowel sounds normal; no masses,  no organomegaly Extremities: extremities normal, atraumatic, no cyanosis or edema  ECOG PERFORMANCE STATUS: 0 - Asymptomatic  Blood pressure 128/78, pulse 97, temperature 98.6 F (37 C), temperature source Oral, resp. rate 18, height '5\' 11"'  (1.803 m), weight 187 lb 12.8 oz (85.2 kg), SpO2 97 %.  LABORATORY DATA: Lab Results  Component Value Date   WBC 6.9 01/19/2018   HGB 16.0 01/19/2018   HCT 48.3 01/19/2018   MCV 90.1 01/19/2018   PLT 195 01/19/2018      Chemistry      Component Value Date/Time  NA 141 01/19/2018 1025   NA 142 01/17/2017 1125   K 4.4 01/19/2018 1025   K 4.6 01/17/2017 1125   CL 107 01/19/2018 1025   CL 107 04/24/2013 1110   CO2 27 01/19/2018 1025   CO2 26 01/17/2017 1125   BUN 14 01/19/2018 1025   BUN 13.0 01/17/2017 1125   CREATININE 1.07 01/19/2018 1025   CREATININE 1.0 01/17/2017 1125      Component Value Date/Time   CALCIUM 9.3 01/19/2018 1025   CALCIUM 9.1 01/17/2017 1125   ALKPHOS 60 01/19/2018 1025   ALKPHOS 59 01/17/2017 1125   AST 17 01/19/2018 1025   AST 15 01/17/2017 1125   ALT 17 01/19/2018 1025   ALT 18 01/17/2017 1125   BILITOT 1.6 (H) 01/19/2018 1025   BILITOT 1.57 (H) 01/17/2017 1125       RADIOGRAPHIC STUDIES: Ct Chest W Contrast  Result Date: 01/20/2018 CLINICAL DATA:  Stage IIIA right upper lobe lung adenocarcinoma status post right upper lobectomy 02/21/2013, adjuvant chemotherapy and radiation therapy completed 08/27/2013. Patient presents for restaging on interval observation. EXAM: CT CHEST WITH CONTRAST TECHNIQUE: Multidetector CT imaging of  the chest was performed during intravenous contrast administration. CONTRAST:  69m ISOVUE-300 IOPAMIDOL (ISOVUE-300) INJECTION 61% COMPARISON:  07/21/2017 chest CT. FINDINGS: Cardiovascular: Normal heart size. No significant pericardial fluid/thickening. Left anterior descending coronary atherosclerosis. Atherosclerotic nonaneurysmal thoracic aorta. Normal caliber pulmonary arteries. No central pulmonary emboli. Mediastinum/Nodes: No discrete thyroid nodules. Unremarkable esophagus. No pathologically enlarged axillary, mediastinal or hilar lymph nodes. Lungs/Pleura: Status post right upper lobectomy. No pneumothorax. No pleural effusion. Stable sharply marginated perihilar consolidation in the mid to upper right lung with associated mild bronchiectasis, volume loss and distortion, compatible with radiation fibrosis. Stable 1.6 cm subsolid opacity in the superior segment right lower lobe (series 5/image 33), which has a curvilinear appearance on coronal reformats, and is favored to represent post treatment or postinflammatory change. No acute consolidative airspace disease, lung masses or significant pulmonary nodules. Upper abdomen: Partially visualized exophytic 1.4 cm renal cortical lesion in the posterior upper left kidney with indeterminate density of 46 HU (series 2/image 166), unchanged in the interval, decreased in size from 2.5 cm on 02/14/2013 PET-CT. Musculoskeletal: No aggressive appearing focal osseous lesions. Marked thoracic spondylosis. IMPRESSION: 1. No evidence of local tumor recurrence in the right lung status post right upper lobectomy. Stable right perihilar radiation fibrosis. 2. No findings of metastatic disease in the chest. 3. Stable partially visualized exophytic small renal cortical lesion in the posterior upper left kidney, technically indeterminate by density on this scan, although probably benign given decreased size since the 2014 PET/CT study. 4. One vessel coronary atherosclerosis.  Aortic Atherosclerosis (ICD10-I70.0). Electronically Signed   By: JIlona SorrelM.D.   On: 01/20/2018 09:47   ASSESSMENT AND PLAN: This is a very pleasant 83years old white male with history of stage IIIa non-small cell lung cancer, adenocarcinoma with positive EGFR mutation in exon 19 status post right upper lobectomy with lymph node dissection followed by 4 cycles of adjuvant systemic chemotherapy followed by adjuvant radiation to the mediastinum. The patient has no complaints today. The recent CT scan of the chest showed no concerning findings for disease recurrence or progression. I discussed the scan results with the patient and recommended for him to continue on observation with repeat CT scan of the chest in 1 year. He was advised to call immediately if he has any concerning symptoms in the interval. The patient voices understanding of current disease  status and treatment options and is in agreement with the current care plan. All questions were answered. The patient knows to call the clinic with any problems, questions or concerns. We can certainly see the patient much sooner if necessary. I spent 10 minutes counseling the patient face to face. The total time spent in the appointment was 15 minutes.  Disclaimer: This note was dictated with voice recognition software. Similar sounding words can inadvertently be transcribed and may not be corrected upon review.

## 2018-01-26 NOTE — Telephone Encounter (Signed)
Scheduled appt per 3/28 los - sent reminder letter in the mail - lab and f/u with ct in one year.

## 2018-01-27 ENCOUNTER — Ambulatory Visit: Payer: Federal, State, Local not specified - PPO | Admitting: Cardiothoracic Surgery

## 2018-01-27 ENCOUNTER — Other Ambulatory Visit: Payer: Self-pay

## 2018-01-27 ENCOUNTER — Encounter: Payer: Self-pay | Admitting: Cardiothoracic Surgery

## 2018-01-27 VITALS — BP 123/82 | HR 95 | Resp 18 | Ht 71.0 in | Wt 189.4 lb

## 2018-01-27 DIAGNOSIS — C3411 Malignant neoplasm of upper lobe, right bronchus or lung: Secondary | ICD-10-CM | POA: Diagnosis not present

## 2018-01-27 NOTE — Progress Notes (Signed)
Alvin Hardy       [place] [address]             [phone]                 Jmari W Gaultney [facility] Medical Record #[id number] Date of Birth: [birth year]  Alvin Bears, MD Alvin Infante, MD  Chief Complaint:   PostOp Follow Up Visit  02/21/2013   PREOPERATIVE DIAGNOSIS: Right upper lobe lung mass suspicious for  malignancy.  POSTOPERATIVE DIAGNOSIS: Right upper lobe lung mass suspicious for  malignancy. 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  PROCEDURE PERFORMED: Bronchoscopy, right upper lobe. Bronchoscopy  right video-assisted thoracoscopy, mini thoracotomy and right upper lobe  resection with lymph node dissection and placement of Hardy-Q device.  SURGEON: Alvin Bal, MD   Stage III A (T2 A., N2, M0) non-small cell lung cancer, adenocarcinoma with positive EGFR mutation in exon 19 and negative ALK gene translocation diagnosed in April 2014   Treatment :  1) Status post right upper lobectomy with lymph node dissection   02/21/2013.  2) Systemic adjuvant chemotherapy with carboplatin for AUC of 5 and Alimta 500 mg/M2 every 3 weeks. Status post 4 cycles. Last cycle was given Hardy 05/29/2013. (Carboplatin was used in place of cisplatin secondary to concern about intolerability and significant adverse effects of cisplatin). Dr Julien Hardy 3) Adjuvant radiotherapy to the mediastinum under the care of Dr. Lisbeth Hardy completed 08/27/2013.      History of Present Illness:     Since last seen the patient has had no new symptoms.  He denies hemoptysis he has occasional cough.  Weight is been stable.  He remains active without being significantly limited by respiratory status.  Social History   Tobacco Use  Smoking Status Former Smoker  . Types: Cigarettes  . Last attempt to quit: 11/01/1978  . Years since quitting: 39.2  Smokeless Tobacco Never Used       Allergies  Allergen Reactions  . Codeine Anaphylaxis  .  Decadron [Dexamethasone]     Pt says it is contraindication due to the lithium he is taking  . Other     Pt. Remarks that all pain med. Make him nauseated   . Statins     Lipitor, Crestor, Zocor all caused muscle aches  . Zetia [Ezetimibe]     Muscle aches  . Zoloft [Sertraline Hcl] Other (See Comments)    REACTION:  unknown    Current Outpatient Medications  Medication Sig Dispense Refill  . aspirin EC 81 MG tablet Take 81 mg by mouth every morning.     . Calcium Carbonate-Vitamin D (CALTRATE 600+D) 600-400 MG-UNIT per tablet Take 1 tablet by mouth every morning.    . Cholecalciferol (VITAMIN D-3) 1000 UNITS CAPS Take 1,000 Units by mouth every morning.     . clobetasol cream (TEMOVATE) 4.66 % Apply 1 application topically daily. Reported Hardy 01/27/2016    . clonazePAM (KLONOPIN) 0.5 MG tablet Take 0.5 mg by mouth at bedtime.    . cyanocobalamin 1000 MCG tablet Take 1,000 mcg by mouth every morning.     Marland Kitchen esomeprazole (NEXIUM) 40 MG capsule Take 40 mg by mouth daily before breakfast.    . finasteride (PROSCAR) 5 MG tablet Take 5 mg by mouth daily before breakfast.     . gabapentin (NEURONTIN) 300 MG capsule Take 600-900 mg by mouth 2 (two) times daily. He takes two capsules  every morning and three capsules at bedtime.    Marland Kitchen lithium carbonate (LITHOBID) 300 MG CR tablet Take 600 mg by mouth at bedtime.     . Multiple Vitamin (MULTIVITAMIN WITH MINERALS) TABS tablet Take 1 tablet by mouth every morning.    Marland Kitchen OVER THE COUNTER MEDICATION Take 1 tablet by mouth 2 (two) times daily. Glucosamine Chondroitin 1532m/1200mg    . Probiotic Product (PROBIOTIC DAILY PO) Take 1 each by mouth daily.    . propranolol (INDERAL) 10 MG tablet Take 10-20 mg by mouth daily as needed (For extremely stressful circumstances.).     .Marland KitchenREPATHA SURECLICK 1462MG/ML SOAJ Every 15 days    . tamsulosin (FLOMAX) 0.4 MG CAPS capsule Take 0.4 mg by mouth daily.  3  . testosterone cypionate (DEPOTESTOTERONE CYPIONATE) 200  MG/ML injection Inject 0.5 mLs into the muscle every 21 ( twenty-one) days. Now 3063mml  1  . vardenafil (LEVITRA) 5 MG tablet Take 5 mg by mouth daily as needed for erectile dysfunction.      No current facility-administered medications for this visit.        Physical Exam: There were no vitals taken for this visit.  Wt Readings from Last 3 Encounters:  01/26/18 187 lb 12.8 oz (85.2 kg)  07/21/17 189 lb (85.7 kg)  01/27/17 189 lb (85.7 kg)   General appearance: alert and cooperative Head: Normocephalic, without obvious abnormality, atraumatic Neck: no adenopathy, no carotid bruit, no JVD, supple, symmetrical, trachea midline and thyroid not enlarged, symmetric, no tenderness/mass/nodules Lymph nodes: Cervical, supraclavicular, and axillary nodes normal. Resp: clear to auscultation bilaterally Back: symmetric, no curvature. ROM normal. No CVA tenderness. Cardio: regular rate and rhythm, S1, S2 normal, no murmur, click, rub or gallop GI: soft, non-tender; bowel sounds normal; no masses,  no organomegaly Extremities: extremities normal, atraumatic, no cyanosis or edema Neurologic: Grossly normal  ,Diagnostic Studies & Laboratory data:         Recent Radiology Findings: Ct Chest W Contrast  Result Date: 01/20/2018 CLINICAL DATA:  Stage IIIA right upper lobe lung adenocarcinoma status post right upper lobectomy 02/21/2013, adjuvant chemotherapy and radiation therapy completed 08/27/2013. Patient presents for restaging Hardy interval observation. EXAM: CT CHEST WITH CONTRAST TECHNIQUE: Multidetector CT imaging of the chest was performed during intravenous contrast administration. CONTRAST:  7511mSOVUE-300 IOPAMIDOL (ISOVUE-300) INJECTION 61% COMPARISON:  07/21/2017 chest CT. FINDINGS: Cardiovascular: Normal heart size. No significant pericardial fluid/thickening. Left anterior descending coronary atherosclerosis. Atherosclerotic nonaneurysmal thoracic aorta. Normal caliber pulmonary  arteries. No central pulmonary emboli. Mediastinum/Nodes: No discrete thyroid nodules. Unremarkable esophagus. No pathologically enlarged axillary, mediastinal or hilar lymph nodes. Lungs/Pleura: Status post right upper lobectomy. No pneumothorax. No pleural effusion. Stable sharply marginated perihilar consolidation in the mid to upper right lung with associated mild bronchiectasis, volume loss and distortion, compatible with radiation fibrosis. Stable 1.6 cm subsolid opacity in the superior segment right lower lobe (series 5/image 33), which has a curvilinear appearance Hardy coronal reformats, and is favored to represent post treatment or postinflammatory change. No acute consolidative airspace disease, lung masses or significant pulmonary nodules. Upper abdomen: Partially visualized exophytic 1.4 cm renal cortical lesion in the posterior upper left kidney with indeterminate density of 46 HU (series 2/image 166), unchanged in the interval, decreased in size from 2.5 cm Hardy 02/14/2013 PET-CT. Musculoskeletal: No aggressive appearing focal osseous lesions. Marked thoracic spondylosis. IMPRESSION: 1. No evidence of local tumor recurrence in the right lung status post right upper lobectomy. Stable right perihilar radiation fibrosis. 2. No  findings of metastatic disease in the chest. 3. Stable partially visualized exophytic small renal cortical lesion in the posterior upper left kidney, technically indeterminate by density Hardy this scan, although probably benign given decreased size since the 2014 PET/CT study. 4. One vessel coronary atherosclerosis. Aortic Atherosclerosis (ICD10-I70.0). Electronically Signed   By: Alvin Hardy M.D.   Hardy: 01/20/2018 09:47   I have independently reviewed the above radiology studies  and reviewed the findings with the patient.   Ct Chest Wo Contrast  Result Date: 07/21/2017 CLINICAL DATA:  History of right upper lobectomy for lung cancer. Follow-up for right apical ground-glass  opacity. EXAM: CT CHEST WITHOUT CONTRAST TECHNIQUE: Multidetector CT imaging of the chest was performed following the standard protocol without IV contrast. COMPARISON:  01/17/2017 FINDINGS: Cardiovascular: The heart size is normal. No pericardial effusion. Stable trace pericardial fluid or thickening. Coronary artery calcification is evident. Atherosclerotic calcification is noted in the wall of the thoracic aorta. Mediastinum/Nodes: No mediastinal lymphadenopathy. Surgical changes again noted in the right hilum. No left hilar lymphadenopathy evident Hardy this noncontrast exam. The esophagus has normal imaging features. There is no axillary lymphadenopathy. Lungs/Pleura: Volume loss right hemithorax compatible with reported history of right upper lobectomy. The irregular sub solid opacity in the right apex persists and is similar in appearance. The lesion measures 1.8 cm maximum diameter today compared to 2.0 cm previously. Post radiation fibrosis identified in the parahilar right lung. No new or progressive interval findings. Upper Abdomen: Exophytic lesion in the upper pole the left kidney is incompletely visualized (image 168 series 2). This has been present since at least 07/22/2015 and is stable in size. Average attenuation of this lesion appears increased in the interval suggesting likely hemorrhage into a pre-existing upper pole cyst. Musculoskeletal: Bone windows reveal no worrisome lytic or sclerotic osseous lesions. IMPRESSION: 1. Stable CT scan of the chest. No new or progressive findings. Specifically, the right apical sub solid opacity is stable. 2. Interval increase in attenuation of an exophytic lesion upper pole left kidney, incompletely visualized. This is been present for more than 3 years and appears today likely reflects hemorrhage into a pre-existing cyst. Addition of abdominal CT to the routine follow-up chest CT may prove helpful to establish a new baseline for this lesion. 3.  Aortic  Atherosclerois (ICD10-170.0) Electronically Signed   By: Alvin Hardy M.D.   Hardy: 07/21/2017 13:09   Ct Chest W Contrast  Result Date: 01/17/2017 CLINICAL DATA:  Lung cancer diagnosed 2014, status post right lung surgery, chemotherapy and XRT complete EXAM: CT CHEST WITH CONTRAST TECHNIQUE: Multidetector CT imaging of the chest was performed during intravenous contrast administration. CONTRAST:  64m ISOVUE-300 IOPAMIDOL (ISOVUE-300) INJECTION 61% COMPARISON:  07/19/2016 FINDINGS: Cardiovascular: Heart is normal in size.  No pericardial effusion. Mild atherosclerotic calcifications of the aortic arch. Mediastinum/Nodes: No suspicious mediastinal lymphadenopathy. Visualized thyroid is unremarkable. Lungs/Pleura: Status post right upper lobectomy. Radiation changes in right paramediastinal/perihilar region. Stable 2.0 cm ground-glass nodular opacity in the right lung apex (series 5/ image 35), favored to reflect radiation changes versus scarring. Left lung is clear. No focal consolidation. Trace right pleural fluid.  No pneumothorax. Upper Abdomen: 1.8 cm hyperdense/ hemorrhagic posterior left upper pole renal cyst (series 2/ image 170). Musculoskeletal: Degenerative changes of the visualized thoracolumbar spine. IMPRESSION: Status post right upper lobectomy. Radiation changes in the right paramediastinal/perihilar region. No evidence of recurrent or metastatic disease. Electronically Signed   By: SJulian HyM.D.   Hardy: 01/17/2017 15:31  I have independently reviewed the above radiology studies  and reviewed the findings with the patient. I agree with radiology report but am concerned the the right upper lobe ground glass nodularity is slightly more solid then 6 months ago.    Ct Chest W Contrast  Result Date: 07/19/2016 CLINICAL DATA:  Malignant neoplasm of right upper lobe. Restaging. Partial right-sided resection. Chemotherapy and radiation therapy completed 08/27/2013. EXAM: CT CHEST WITH  CONTRAST TECHNIQUE: Multidetector CT imaging of the chest was performed during intravenous contrast administration. CONTRAST:  29m ISOVUE-300 IOPAMIDOL (ISOVUE-300) INJECTION 61% COMPARISON:  01/26/2016 FINDINGS: Cardiovascular: Aortic and branch vessel atherosclerosis. Tortuous thoracic aorta. Normal heart size, without pericardial effusion. No central pulmonary embolism, Hardy this non-dedicated study. Mediastinum/Nodes: No supraclavicular adenopathy. No mediastinal or hilar adenopathy. Lungs/Pleura: Trace right-sided pleural fluid or thickening are similar. Status post right upper lobectomy. Similar configuration of right paramediastinal radiation fibrosis. Presumed scarring in the right lower lobe Hardy image 85/ series 5 is unchanged. Mild right apical ground-glass opacity is not significantly changed Hardy image 27/series 5 and also favored to represent scarring. Upper Abdomen: Normal imaged portions of the liver, spleen, stomach, pancreas, adrenal glands. Incompletely imaged left renal lesion measures 1.8 cm and greater than fluid density, similar to Hardy the prior exam. Musculoskeletal: Moderate thoracic spondylosis. No focal osseous lesion. IMPRESSION: 1. Status post right upper lobectomy. No evidence of recurrent or metastatic disease. 2. Incompletely imaged left renal lesion is again favored to represent a complex cyst, when compared to prior pre and postcontrast exams. Electronically Signed   By: KAbigail MiyamotoM.D.   Hardy: 07/19/2016 11:59   I have independently reviewed the above radiology studies  and reviewed the findings with the patient.  Ct Chest W Contrast  07/22/2015   CLINICAL DATA:  Subsequent evaluation of a 83year old male with history of lung cancer diagnosed in March 2014 status post prior lung surgery as well as chemotherapy and radiation therapy now complete.  EXAM: CT CHEST WITH CONTRAST  TECHNIQUE: Multidetector CT imaging of the chest was performed during intravenous contrast administration.   CONTRAST:  751mOMNIPAQUE IOHEXOL 300 MG/ML  SOLN  COMPARISON:  Chest CT 01/14/2015.  FINDINGS: Mediastinum/Lymph Nodes: Heart size is normal. There is no significant pericardial fluid, thickening or pericardial calcification. No pathologically enlarged mediastinal or hilar lymph nodes. Esophagus is unremarkable in appearance. No axillary lymphadenopathy.  Lungs/Pleura: Status post right upper lobectomy. Extensive architectural distortion noted in the paramediastinal aspect of the remaining portions of the right middle and lower lobes, with a few patchy areas of surrounding ground-glass attenuation, similar to prior studies, most compatible with chronic postradiation changes. There is again a 10 x 5 mm area of nodular thickening of an accessory fissure in the right lower lobe posteriorly (image 37 of series 5), which is stable in appearance compared to prior study 07/11/2014, favored to represent an area of chronic scarring. No other suspicious appearing pulmonary nodules or masses are identified Hardy today's examination. No acute consolidative airspace disease. No pleural effusions.  Upper Abdomen: Exophytic incompletely visualized intermediate attenuation (29 HU) lesion in the upper pole of the left kidney is incompletely characterize, but is similar to prior studies, favored to represent a proteinaceous cyst.  Musculoskeletal/Soft Tissues: There are no aggressive appearing lytic or blastic lesions noted in the visualized portions of the skeleton.  IMPRESSION: 1. Stable examination demonstrating postoperative and postradiation changes in the right hemithorax, as above. No definite findings to suggest residual/ recurrent disease or metastatic disease in  the thorax. 2. Additional incidental findings, as above.   Electronically Signed   By: Alvin Hardy M.D.   Hardy: 07/22/2015 15:44   Recent Labs: Lab Results  Component Value Date   WBC 6.9 01/19/2018   HGB 16.0 01/19/2018   HCT 48.3 01/19/2018   PLT 195  01/19/2018   GLUCOSE 96 01/19/2018   CHOL 206 (H) 06/19/2014   TRIG 216.0 (H) 06/19/2014   HDL 38.50 (L) 06/19/2014   LDLDIRECT 144.0 06/19/2014   ALT 17 01/19/2018   AST 17 01/19/2018   NA 141 01/19/2018   K 4.4 01/19/2018   CL 107 01/19/2018   CREATININE 1.07 01/19/2018   BUN 14 01/19/2018   CO2 27 01/19/2018   INR 0.99 02/20/2013   PATH: stage IIIA,  EGFR: Exon 19 deletion  Mutation detected Diagnosis 1. Lung, resection (segmental or lobe), Right upper lobe - ADENOCARCINOMA, 3.4 CM. - MARGINS NOT INVOLVED. - VISCERAL PLEURA FREE OF TUMOR. - ONE BENIGN LYMPH NODE. 2. Lymph node, biopsy, Right upper 4 R - METASTATIC ADENOCARCINOMA. 3. Lymph node, biopsy, Right 2 R - ANTHRACOTIC LYMPH NODE. NO TUMOR IDENTIFIED. Microscopic Comment 1. LUNG Specimen, including laterality: Right lung lobe. Procedure: Lobectomy. Specimen integrity (intact/disrupted): Intact. Tumor site: Right upper lobe. Tumor focality: Unifocal Maximum tumor size (cm): 3.4 cm. Histologic type: Adenocarcinoma, bronchioalveolar cell type. Grade: I. Margins: Free of tumor. Visceral pleura invasion: No. Tumor extension: Within lobe. Treatment effect (if treated with neoadjuvant therapy): No. Lymph -Vascular invasion: Present. Lymph nodes: Number examined - 3 ; Number N1 nodes positive - 0 ; Number N2 nodes positive - 1 TNM code: pT2a , pN2, pMX Ancillary studies: EGFR and ALK pending. Non-neoplastic lung: Unremarkable     Assessment / Plan:     Stable following resection of right upper lobe, lung cancer which was found to be EGFR positive adenocarcinoma stage IIIa. Dx 2014  No evidence of recurrent disease Hardy CT scan.  Now 5 years postop plan see the patient back in 1 year. . He has a CT of the chest already ordered by oncology for approximately 1 year from now.      Grace Isaac 01/27/2018 10:12 AM

## 2018-01-31 ENCOUNTER — Encounter: Payer: Federal, State, Local not specified - PPO | Admitting: Cardiothoracic Surgery

## 2018-02-27 DIAGNOSIS — M7061 Trochanteric bursitis, right hip: Secondary | ICD-10-CM | POA: Insufficient documentation

## 2018-07-10 DIAGNOSIS — K219 Gastro-esophageal reflux disease without esophagitis: Secondary | ICD-10-CM | POA: Insufficient documentation

## 2018-07-10 DIAGNOSIS — M79673 Pain in unspecified foot: Secondary | ICD-10-CM | POA: Insufficient documentation

## 2018-07-10 DIAGNOSIS — R0982 Postnasal drip: Secondary | ICD-10-CM | POA: Insufficient documentation

## 2018-07-10 DIAGNOSIS — J329 Chronic sinusitis, unspecified: Secondary | ICD-10-CM | POA: Insufficient documentation

## 2018-07-10 DIAGNOSIS — R197 Diarrhea, unspecified: Secondary | ICD-10-CM | POA: Insufficient documentation

## 2018-07-10 DIAGNOSIS — I700 Atherosclerosis of aorta: Secondary | ICD-10-CM | POA: Insufficient documentation

## 2018-07-10 DIAGNOSIS — N390 Urinary tract infection, site not specified: Secondary | ICD-10-CM | POA: Insufficient documentation

## 2018-07-10 DIAGNOSIS — M79646 Pain in unspecified finger(s): Secondary | ICD-10-CM | POA: Insufficient documentation

## 2018-07-10 DIAGNOSIS — R300 Dysuria: Secondary | ICD-10-CM | POA: Insufficient documentation

## 2018-07-10 DIAGNOSIS — R7301 Impaired fasting glucose: Secondary | ICD-10-CM | POA: Insufficient documentation

## 2018-07-10 DIAGNOSIS — M545 Low back pain, unspecified: Secondary | ICD-10-CM | POA: Insufficient documentation

## 2018-07-10 DIAGNOSIS — E669 Obesity, unspecified: Secondary | ICD-10-CM | POA: Insufficient documentation

## 2018-07-10 DIAGNOSIS — L408 Other psoriasis: Secondary | ICD-10-CM | POA: Insufficient documentation

## 2018-07-10 DIAGNOSIS — R490 Dysphonia: Secondary | ICD-10-CM | POA: Insufficient documentation

## 2018-07-10 DIAGNOSIS — M1990 Unspecified osteoarthritis, unspecified site: Secondary | ICD-10-CM | POA: Insufficient documentation

## 2018-07-10 DIAGNOSIS — N400 Enlarged prostate without lower urinary tract symptoms: Secondary | ICD-10-CM | POA: Insufficient documentation

## 2018-07-10 DIAGNOSIS — J029 Acute pharyngitis, unspecified: Secondary | ICD-10-CM | POA: Insufficient documentation

## 2018-07-10 DIAGNOSIS — K625 Hemorrhage of anus and rectum: Secondary | ICD-10-CM | POA: Insufficient documentation

## 2018-07-10 DIAGNOSIS — R221 Localized swelling, mass and lump, neck: Secondary | ICD-10-CM | POA: Insufficient documentation

## 2018-07-10 DIAGNOSIS — M7030 Other bursitis of elbow, unspecified elbow: Secondary | ICD-10-CM | POA: Insufficient documentation

## 2018-09-12 DIAGNOSIS — R200 Anesthesia of skin: Secondary | ICD-10-CM | POA: Insufficient documentation

## 2018-12-22 DIAGNOSIS — M19012 Primary osteoarthritis, left shoulder: Secondary | ICD-10-CM | POA: Insufficient documentation

## 2019-01-25 ENCOUNTER — Ambulatory Visit: Payer: Federal, State, Local not specified - PPO | Admitting: Cardiothoracic Surgery

## 2019-01-26 ENCOUNTER — Inpatient Hospital Stay: Payer: Medicare Other | Attending: Internal Medicine

## 2019-01-26 ENCOUNTER — Encounter (HOSPITAL_COMMUNITY): Payer: Self-pay

## 2019-01-26 ENCOUNTER — Ambulatory Visit (HOSPITAL_COMMUNITY)
Admission: RE | Admit: 2019-01-26 | Discharge: 2019-01-26 | Disposition: A | Discharge status: Home / Self Care | Payer: Federal, State, Local not specified - PPO | Source: Ambulatory Visit | Attending: Internal Medicine | Admitting: Internal Medicine

## 2019-01-26 ENCOUNTER — Other Ambulatory Visit: Payer: Self-pay

## 2019-01-26 DIAGNOSIS — C3490 Malignant neoplasm of unspecified part of unspecified bronchus or lung: Secondary | ICD-10-CM | POA: Diagnosis present

## 2019-01-26 DIAGNOSIS — Z7982 Long term (current) use of aspirin: Secondary | ICD-10-CM | POA: Diagnosis not present

## 2019-01-26 DIAGNOSIS — E041 Nontoxic single thyroid nodule: Secondary | ICD-10-CM | POA: Insufficient documentation

## 2019-01-26 DIAGNOSIS — C3411 Malignant neoplasm of upper lobe, right bronchus or lung: Secondary | ICD-10-CM | POA: Diagnosis present

## 2019-01-26 DIAGNOSIS — Z79899 Other long term (current) drug therapy: Secondary | ICD-10-CM | POA: Insufficient documentation

## 2019-01-26 DIAGNOSIS — N400 Enlarged prostate without lower urinary tract symptoms: Secondary | ICD-10-CM | POA: Insufficient documentation

## 2019-01-26 DIAGNOSIS — Z923 Personal history of irradiation: Secondary | ICD-10-CM | POA: Insufficient documentation

## 2019-01-26 DIAGNOSIS — E785 Hyperlipidemia, unspecified: Secondary | ICD-10-CM | POA: Insufficient documentation

## 2019-01-26 DIAGNOSIS — Z9221 Personal history of antineoplastic chemotherapy: Secondary | ICD-10-CM | POA: Insufficient documentation

## 2019-01-26 DIAGNOSIS — F319 Bipolar disorder, unspecified: Secondary | ICD-10-CM | POA: Diagnosis not present

## 2019-01-26 DIAGNOSIS — Z902 Acquired absence of lung [part of]: Secondary | ICD-10-CM | POA: Insufficient documentation

## 2019-01-26 LAB — CMP (CANCER CENTER ONLY)
ALBUMIN: 3.8 g/dL (ref 3.5–5.0)
ALT: 18 U/L (ref 0–44)
AST: 16 U/L (ref 15–41)
Alkaline Phosphatase: 59 U/L (ref 38–126)
Anion gap: 7 (ref 5–15)
BUN: 12 mg/dL (ref 8–23)
CHLORIDE: 107 mmol/L (ref 98–111)
CO2: 28 mmol/L (ref 22–32)
CREATININE: 0.99 mg/dL (ref 0.61–1.24)
Calcium: 8.8 mg/dL — ABNORMAL LOW (ref 8.9–10.3)
GFR, Est AFR Am: >60 mL/min (ref >60)
GLUCOSE: 98 mg/dL (ref 70–99)
Potassium: 4.4 mmol/L (ref 3.5–5.1)
SODIUM: 142 mmol/L (ref 135–145)
Total Bilirubin: 1.2 mg/dL (ref 0.3–1.2)
Total Protein: 6.6 g/dL (ref 6.5–8.1)

## 2019-01-26 LAB — CBC WITH DIFFERENTIAL (CANCER CENTER ONLY)
ABS IMMATURE GRANULOCYTES: 0.1 10*3/uL — AB (ref 0.00–0.07)
Basophils Absolute: 0.1 10*3/uL (ref 0.0–0.1)
Basophils Relative: 1 %
Eosinophils Absolute: 0.3 10*3/uL (ref 0.0–0.5)
Eosinophils Relative: 4 %
HEMATOCRIT: 48.7 % (ref 39.0–52.0)
HEMOGLOBIN: 15.3 g/dL (ref 13.0–17.0)
Immature Granulocytes: 1 %
LYMPHS ABS: 1.8 10*3/uL (ref 0.7–4.0)
LYMPHS PCT: 21 %
MCH: 27.8 pg (ref 26.0–34.0)
MCHC: 31.4 g/dL (ref 30.0–36.0)
MCV: 88.4 fL (ref 80.0–100.0)
MONO ABS: 0.7 10*3/uL (ref 0.1–1.0)
Monocytes Relative: 8 %
NEUTROS ABS: 5.5 10*3/uL (ref 1.7–7.7)
Neutrophils Relative %: 65 %
Platelet Count: 219 10*3/uL (ref 150–400)
RBC: 5.51 MIL/uL (ref 4.22–5.81)
RDW: 14.5 % (ref 11.5–15.5)
WBC: 8.5 10*3/uL (ref 4.0–10.5)
nRBC: 0 % (ref 0.0–0.2)

## 2019-01-26 MED ORDER — SODIUM CHLORIDE (PF) 0.9 % IJ SOLN
INTRAMUSCULAR | Status: AC
  Filled 2019-01-26: qty 50

## 2019-01-26 MED ORDER — IOHEXOL 300 MG/ML  SOLN
75.0000 mL | Freq: Once | INTRAMUSCULAR | Status: AC | PRN
  Administered 2019-01-26: 75 mL via INTRAVENOUS

## 2019-01-29 ENCOUNTER — Telehealth: Payer: Self-pay | Admitting: Internal Medicine

## 2019-01-29 ENCOUNTER — Inpatient Hospital Stay (HOSPITAL_BASED_OUTPATIENT_CLINIC_OR_DEPARTMENT_OTHER): Payer: Medicare Other | Admitting: Internal Medicine

## 2019-01-29 ENCOUNTER — Other Ambulatory Visit: Payer: Self-pay

## 2019-01-29 DIAGNOSIS — Z85118 Personal history of other malignant neoplasm of bronchus and lung: Secondary | ICD-10-CM | POA: Diagnosis not present

## 2019-01-29 DIAGNOSIS — C3411 Malignant neoplasm of upper lobe, right bronchus or lung: Secondary | ICD-10-CM

## 2019-01-29 DIAGNOSIS — C3490 Malignant neoplasm of unspecified part of unspecified bronchus or lung: Secondary | ICD-10-CM

## 2019-01-29 NOTE — Telephone Encounter (Signed)
No 3/30 los

## 2019-01-29 NOTE — Progress Notes (Signed)
Virtual Visit via Telephone Note  I connected with Alvin Hardy on 01/29/19 at 10:30 AM EDT by telephone and verified that I am speaking with the correct person using two identifiers.   I discussed the limitations, risks, security and privacy concerns of performing an evaluation and management service by telephone and the availability of in person appointments. I also discussed with the patient that there may be a patient responsible charge related to this service. The patient expressed understanding and agreed to proceed.  DIAGNOSIS AND STAGE: Stage III A (T2a, N2, M0) non-small cell lung cancer, adenocarcinoma with positive EGFR mutation in exon 19 and negative ALK gene translocation diagnosed in April 2014   PRIOR THERAPY:  1) Status post right upper lobectomy with lymph node dissection under the care of Dr. Servando Snare on 02/21/2013. 2) Systemic adjuvant chemotherapy with carboplatin for AUC of 5 and Alimta 500 mg/M2 every 3 weeks. Status post 4 cycles. Last cycle was given on 05/29/2013. (Carboplatin was used in place of cisplatin secondary to concern about intolerability and significant adverse effects of cisplatin).  3) Adjuvant radiotherapy to the mediastinum under the care of Dr. Lisbeth Renshaw completed 08/27/2013.   CURRENT THERAPY: Observation  History of Present Illness:  Alvin Hardy 84 y.o. male had a telephone visual visit today.  The patient is feeling fine today with no concerning complaints.  He moved to independent living facility in September 2019.  He is enjoying his new environment and housing.  He denied having any current chest pain, shortness of breath, cough or hemoptysis.  He denied having any fever or chills.  He has no nausea, vomiting, diarrhea or constipation.  He denied having any headache or visual changes.  The patient had repeat CT scan of the chest performed recently and we have the visual telephone meeting for discussion of his scan results and recommendation regarding  his condition.  MEDICAL HISTORY: Past Medical History:  Diagnosis Date  . Allergy   . Arthritis    psoriatic arthritis, ? , treated /w mmethotrexate   . Bipolar 1 disorder ([place])   . BPH (benign prostatic hyperplasia)   . Cataract 2010   Bilateral  . Chest pain   . Depression   . Deviated septum    TO THE LEFT  . ED (erectile dysfunction)   . Finger injury    mallet finger-- 02/13/2013, cast in place, followed by dr. Fredna Dow  . GERD (gastroesophageal reflux disease)   . Gilbert's syndrome   . History of radiation therapy 07/24/13-08/27/13   50Gy/25 fx chest  . Hyperlipidemia   . Hypogonadism male   . Laryngopharyngeal reflux   . lung ca dx'd 12/2012   rul  . Lung mass 02/07/13   RIGHT UPPER LOBE  . Morton's neuroma    LEFT FOOT  . Neuromuscular disorder ([place])    peripheral neuropathy  . Shortness of breath    voice breaks, ? related to reflux, although told by Dr. Ardis Hughs, S.- early 73 yr. old    ALLERGIES:  is allergic to codeine; decadron [dexamethasone]; other; statins; zetia [ezetimibe]; and zoloft [sertraline hcl].  MEDICATIONS:  Current Outpatient Medications  Medication Sig Dispense Refill  . aspirin EC 81 MG tablet Take 81 mg by mouth every morning.     . Calcium Carbonate-Vitamin D (CALTRATE 600+D) 600-400 MG-UNIT per tablet Take 1 tablet by mouth every morning.    . Cholecalciferol (VITAMIN D-3) 1000 UNITS CAPS Take 1,000 Units by mouth every morning.     Marland Kitchen  clobetasol cream (TEMOVATE) 8.02 % Apply 1 application topically daily. Reported on 01/27/2016    . clonazePAM (KLONOPIN) 0.5 MG tablet Take 0.5 mg by mouth at bedtime.    . cyanocobalamin 1000 MCG tablet Take 1,000 mcg by mouth every morning.     Marland Kitchen esomeprazole (NEXIUM) 40 MG capsule Take 40 mg by mouth daily before breakfast.    . finasteride (PROSCAR) 5 MG tablet Take 5 mg by mouth daily before breakfast.     . gabapentin (NEURONTIN) 300 MG capsule Take 600-900 mg by mouth 2 (two) times daily. He takes two  capsules every morning and three capsules at bedtime.    Marland Kitchen lithium carbonate (LITHOBID) 300 MG CR tablet Take 600 mg by mouth at bedtime.     . Multiple Vitamin (MULTIVITAMIN WITH MINERALS) TABS tablet Take 1 tablet by mouth every morning.    Marland Kitchen OVER THE COUNTER MEDICATION Take 1 tablet by mouth 2 (two) times daily. Glucosamine Chondroitin 1564m/1200mg    . Probiotic Product (PROBIOTIC DAILY PO) Take 1 each by mouth daily.    . propranolol (INDERAL) 10 MG tablet Take 10-20 mg by mouth daily as needed (For extremely stressful circumstances.).     .Marland KitchenREPATHA SURECLICK 1233MG/ML SOAJ Every 15 days    . tamsulosin (FLOMAX) 0.4 MG CAPS capsule Take 0.4 mg by mouth daily.  3  . testosterone cypionate (DEPOTESTOTERONE CYPIONATE) 200 MG/ML injection Inject 0.5 mLs into the muscle every 21 ( twenty-one) days. Now 3045mml  1  . vardenafil (LEVITRA) 5 MG tablet Take 5 mg by mouth daily as needed for erectile dysfunction.      No current facility-administered medications for this visit.     SURGICAL HISTORY:  Past Surgical History:  Procedure Laterality Date  . CYSTOSCOPY N/A 02/21/2013   Procedure: CYSTOSCOPY FLEXIBLE with insertion of foley catheter;  Surgeon: MaFredricka BonineMD;  [facility];  Laterality: N/A;  . EYE SURGERY     cataracts removed fr. both eyes, IOL in place   . KNEE ARTHROSCOPY Right 01/2007  . PILONIDAL CYST EXCISION  1960's  . [place] [facility]  . VIDEO ASSISTED THORACOSCOPY (VATS)/WEDGE RESECTION Right 02/21/2013   Procedure: Right VIDEO ASSISTED THORACOSCOPY ,Thoracotomy with right upper lobectomy, node sampling ;  Surgeon: EdGrace IsaacMD;  [facility];  Laterality: Right;  . Marland KitchenIDEO BRONCHOSCOPY N/A 02/21/2013   Procedure: VIDEO BRONCHOSCOPY;  Surgeon: EdGrace IsaacMD;  [facility];  Service: Thoracic;  Laterality: N/A;    REVIEW OF SYSTEMS:  A comprehensive review of systems was negative.    LABORATORY DATA: Lab Results  Component Value Date   WBC 8.5 01/26/2019   HGB 15.3 01/26/2019   HCT 48.7 01/26/2019   MCV 88.4 01/26/2019   PLT 219 01/26/2019      Chemistry      Component Value Date/Time   NA 142 01/26/2019 0905   NA 142 01/17/2017 1125   K 4.4 01/26/2019 0905   K 4.6 01/17/2017 1125   CL 107 01/26/2019 0905   CL 107 04/24/2013 1110   CO2 28 01/26/2019 0905   CO2 26 01/17/2017 1125   BUN 12 01/26/2019 0905   BUN 13.0 01/17/2017 1125   CREATININE 0.99 01/26/2019 0905   CREATININE 1.0 01/17/2017 1125      Component Value Date/Time   CALCIUM 8.8 (L) 01/26/2019 0905   CALCIUM 9.1 01/17/2017 1125   ALKPHOS 59  01/26/2019 0905   ALKPHOS 59 01/17/2017 1125   AST 16 01/26/2019 0905   AST 15 01/17/2017 1125   ALT 18 01/26/2019 0905   ALT 18 01/17/2017 1125   BILITOT 1.2 01/26/2019 0905   BILITOT 1.57 (H) 01/17/2017 1125       RADIOGRAPHIC STUDIES: Ct Chest W Contrast  Result Date: 01/26/2019 CLINICAL DATA:  Lung cancer. EXAM: CT CHEST WITH CONTRAST TECHNIQUE: Multidetector CT imaging of the chest was performed during intravenous contrast administration. CONTRAST:  63m OMNIPAQUE IOHEXOL 300 MG/ML  SOLN COMPARISON:  01/19/2018 FINDINGS: Cardiovascular: The heart size is normal. No substantial pericardial effusion. Atherosclerotic calcification is noted in the wall of the thoracic aorta. Mediastinum/Nodes: No mediastinal lymphadenopathy. There is no hilar lymphadenopathy. The esophagus has normal imaging features. 8 mm left thyroid nodule slightly more prominent than before. There is no axillary lymphadenopathy. Lungs/Pleura: Status post right upper lobectomy. Similar appearance radiation fibrosis in the parahilar right lung. 16 mm irregular nodule in the right lung apex previously is stable at 16 mm today. This has a flat, platelike configuration on coronal imaging (90/6). No new suspicious nodule or mass in the left lung. Tiny right pleural effusion is similar  to prior. No left pleural effusion. Upper Abdomen: 18 mm exophytic lesion upper pole left kidney is stable since prior study and comparing back to 07/19/2016. This has attenuation higher than would be expected for a simple cyst but given interval stability is likely a cyst complicated by proteinaceous debris or hemorrhage. Musculoskeletal: No worrisome lytic or sclerotic osseous abnormality. IMPRESSION: 1. Stable exam. Status post right upper lobectomy with radiation fibrosis in the parahilar right lung. No new or progressive findings to suggest recurrent or metastatic disease. 2. Stable tiny right pleural effusion. 3.  Aortic Atherosclerois (ICD10-170.0) Electronically Signed   By: EMisty StanleyM.D.   On: 01/26/2019 11:50  Assessment and Plan: This is a very pleasant 84years old white male with history of stage IIIa non-small cell lung cancer, adenocarcinoma with positive EGFR mutation in exon 19 status post right upper lobectomy with lymph node dissection followed by 4 cycles of adjuvant systemic chemotherapy followed by adjuvant radiation to the mediastinum. The patient is currently on observation and he is feeling fine with no concerning complaints. He had repeat CT scan of the chest performed recently.  I personally and independently reviewed the scans and discussed the results with the patient today. His scan showed no concerning findings for disease recurrence or progression. I recommended for the patient to continue on observation with repeat CT scan of the chest in 1 year. He was advised to call immediately if he has any concerning symptoms in the interval.  Follow Up Instructions: Follow-up visit with repeat CT scan of the chest and lab work in 1 year.   I discussed the assessment and treatment plan with the patient. The patient was provided an opportunity to ask questions and all were answered. The patient agreed with the plan and demonstrated an understanding of the instructions.   The  patient was advised to call back or seek an in-person evaluation if the symptoms worsen or if the condition fails to improve as anticipated.  I provided 15 minutes of non-face-to-face time during this encounter.   MEilleen Kempf MD

## 2019-01-30 ENCOUNTER — Telehealth: Payer: Self-pay | Admitting: Internal Medicine

## 2019-01-30 NOTE — Telephone Encounter (Signed)
Scheduled appt per 3/30 sch message - sent reminder letter in the mail with appt date and time

## 2019-02-01 ENCOUNTER — Ambulatory Visit: Payer: Federal, State, Local not specified - PPO | Admitting: Cardiothoracic Surgery

## 2019-02-01 ENCOUNTER — Telehealth: Payer: Self-pay | Admitting: Cardiothoracic Surgery

## 2019-02-01 NOTE — Telephone Encounter (Signed)
[address]       [place] [address]             [phone]                 Tevis W Mcdill [facility] Medical Record #[id number] Date of Birth: 08-28-1935  No ref. provider found Crist Infante, MD  Chief Complaint:   PostOp Follow Up Visit  02/21/2013   PREOPERATIVE DIAGNOSIS: Right upper lobe lung mass suspicious for  malignancy.  POSTOPERATIVE DIAGNOSIS: Right upper lobe lung mass suspicious for  malignancy. 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  PROCEDURE PERFORMED: Bronchoscopy, right upper lobe. Bronchoscopy  right video-assisted thoracoscopy, mini thoracotomy and right upper lobe  resection with lymph node dissection and placement of On-Q device.  SURGEON: Lanelle Bal, MD   Stage III A (T2 A., N2, M0) non-small cell lung cancer, adenocarcinoma with positive EGFR mutation in exon 19 and negative ALK gene translocation diagnosed in April 2014   Treatment :  1) Status post right upper lobectomy with lymph node dissection   02/21/2013.  2) Systemic adjuvant chemotherapy with carboplatin for AUC of 5 and Alimta 500 mg/M2 every 3 weeks. Status post 4 cycles. Last cycle was given on 05/29/2013. (Carboplatin was used in place of cisplatin secondary to concern about intolerability and significant adverse effects of cisplatin). Dr Julien Nordmann 3) Adjuvant radiotherapy to the mediastinum under the care of Dr. Lisbeth Renshaw completed 08/27/2013.      History of Present Illness:     Since last seen the patient has had no new symptoms.   He denies hemoptysis he has occasional cough.  He is now 6 years following resection of EGFR positive exon 19 adenocarcinoma of the right upper lobe having had right upper lobectomy followed by radiation and chemotherapy February 21, 2013.   Leg and back pin    He remains active without being significantly limited by respiratory status.  Social History   Tobacco Use  Smoking Status Former Smoker     Types: Cigarettes   Last attempt to quit: 11/01/1978   Years since quitting: 40.2  Smokeless Tobacco Never Used       Allergies  Allergen Reactions   Codeine Anaphylaxis   Decadron [Dexamethasone]     Pt says it is contraindication due to the lithium he is taking   Other     Pt. Remarks that all pain med. Make him nauseated    Statins     Lipitor, Crestor, Zocor all caused muscle aches   Zetia [Ezetimibe]     Muscle aches   Zoloft [Sertraline Hcl] Other (See Comments)    REACTION:  unknown    Current Outpatient Medications  Medication Sig Dispense Refill   aspirin EC 81 MG tablet Take 81 mg by mouth every morning.      Calcium Carbonate-Vitamin D (CALTRATE 600+D) 600-400 MG-UNIT per tablet Take 1 tablet by mouth every morning.     Cholecalciferol (VITAMIN D-3) 1000 UNITS CAPS Take 1,000 Units by mouth every morning.      clobetasol cream (TEMOVATE) 3.01 % Apply 1 application topically daily. Reported on 01/27/2016     clonazePAM (KLONOPIN) 0.5 MG tablet Take 0.5 mg by mouth at bedtime.     cyanocobalamin 1000 MCG tablet Take 1,000 mcg by mouth every morning.      esomeprazole (NEXIUM) 40 MG capsule Take 40 mg by mouth daily before breakfast.  finasteride (PROSCAR) 5 MG tablet Take 5 mg by mouth daily before breakfast.      gabapentin (NEURONTIN) 300 MG capsule Take 600-900 mg by mouth 2 (two) times daily. He takes two capsules every morning and three capsules at bedtime.     lithium carbonate (LITHOBID) 300 MG CR tablet Take 600 mg by mouth at bedtime.      Multiple Vitamin (MULTIVITAMIN WITH MINERALS) TABS tablet Take 1 tablet by mouth every morning.     OVER THE COUNTER MEDICATION Take 1 tablet by mouth 2 (two) times daily. Glucosamine Chondroitin 153m/1200mg     Probiotic Product (PROBIOTIC DAILY PO) Take 1 each by mouth daily.     propranolol (INDERAL) 10 MG tablet Take 10-20 mg by mouth daily as needed (For extremely stressful circumstances.).       REPATHA SURECLICK 1213MG/ML SOAJ Every 15 days     tamsulosin (FLOMAX) 0.4 MG CAPS capsule Take 0.4 mg by mouth daily.  3   testosterone cypionate (DEPOTESTOTERONE CYPIONATE) 200 MG/ML injection Inject 0.5 mLs into the muscle every 21 ( twenty-one) days. Now 3037mml  1   vardenafil (LEVITRA) 5 MG tablet Take 5 mg by mouth daily as needed for erectile dysfunction.      No current facility-administered medications for this visit.        Physical Exam: There were no vitals taken for this visit.  Wt Readings from Last 3 Encounters:  01/27/18 189 lb 6.4 oz (85.9 kg)  01/26/18 187 lb 12.8 oz (85.2 kg)  07/21/17 189 lb (85.7 kg)   ,Diagnostic Studies & Laboratory data:         Recent Radiology Findings: Ct Chest W Contrast  Result Date: 01/26/2019 CLINICAL DATA:  Lung cancer. EXAM: CT CHEST WITH CONTRAST TECHNIQUE: Multidetector CT imaging of the chest was performed during intravenous contrast administration. CONTRAST:  7528mMNIPAQUE IOHEXOL 300 MG/ML  SOLN COMPARISON:  01/19/2018 FINDINGS: Cardiovascular: The heart size is normal. No substantial pericardial effusion. Atherosclerotic calcification is noted in the wall of the thoracic aorta. Mediastinum/Nodes: No mediastinal lymphadenopathy. There is no hilar lymphadenopathy. The esophagus has normal imaging features. 8 mm left thyroid nodule slightly more prominent than before. There is no axillary lymphadenopathy. Lungs/Pleura: Status post right upper lobectomy. Similar appearance radiation fibrosis in the parahilar right lung. 16 mm irregular nodule in the right lung apex previously is stable at 16 mm today. This has a flat, platelike configuration on coronal imaging (90/6). No new suspicious nodule or mass in the left lung. Tiny right pleural effusion is similar to prior. No left pleural effusion. Upper Abdomen: 18 mm exophytic lesion upper pole left kidney is stable since prior study and comparing back to 07/19/2016. This has  attenuation higher than would be expected for a simple cyst but given interval stability is likely a cyst complicated by proteinaceous debris or hemorrhage. Musculoskeletal: No worrisome lytic or sclerotic osseous abnormality. IMPRESSION: 1. Stable exam. Status post right upper lobectomy with radiation fibrosis in the parahilar right lung. No new or progressive findings to suggest recurrent or metastatic disease. 2. Stable tiny right pleural effusion. 3.  Aortic Atherosclerois (ICD10-170.0) Electronically Signed   By: EriMisty StanleyD.   On: 01/26/2019 11:50   I have independently reviewed the above radiology studies  and reviewed the findings with the patient.   Ct Chest Wo Contrast  Result Date: 07/21/2017 CLINICAL DATA:  History of right upper lobectomy for lung cancer. Follow-up for right apical ground-glass opacity. EXAM: CT CHEST  WITHOUT CONTRAST TECHNIQUE: Multidetector CT imaging of the chest was performed following the standard protocol without IV contrast. COMPARISON:  01/17/2017 FINDINGS: Cardiovascular: The heart size is normal. No pericardial effusion. Stable trace pericardial fluid or thickening. Coronary artery calcification is evident. Atherosclerotic calcification is noted in the wall of the thoracic aorta. Mediastinum/Nodes: No mediastinal lymphadenopathy. Surgical changes again noted in the right hilum. No left hilar lymphadenopathy evident on this noncontrast exam. The esophagus has normal imaging features. There is no axillary lymphadenopathy. Lungs/Pleura: Volume loss right hemithorax compatible with reported history of right upper lobectomy. The irregular sub solid opacity in the right apex persists and is similar in appearance. The lesion measures 1.8 cm maximum diameter today compared to 2.0 cm previously. Post radiation fibrosis identified in the parahilar right lung. No new or progressive interval findings. Upper Abdomen: Exophytic lesion in the upper pole the left kidney is  incompletely visualized (image 168 series 2). This has been present since at least 07/22/2015 and is stable in size. Average attenuation of this lesion appears increased in the interval suggesting likely hemorrhage into a pre-existing upper pole cyst. Musculoskeletal: Bone windows reveal no worrisome lytic or sclerotic osseous lesions. IMPRESSION: 1. Stable CT scan of the chest. No new or progressive findings. Specifically, the right apical sub solid opacity is stable. 2. Interval increase in attenuation of an exophytic lesion upper pole left kidney, incompletely visualized. This is been present for more than 3 years and appears today likely reflects hemorrhage into a pre-existing cyst. Addition of abdominal CT to the routine follow-up chest CT may prove helpful to establish a new baseline for this lesion. 3.  Aortic Atherosclerois (ICD10-170.0) Electronically Signed   By: Misty Stanley M.D.   On: 07/21/2017 13:09   Ct Chest W Contrast  Result Date: 01/17/2017 CLINICAL DATA:  Lung cancer diagnosed 2014, status post right lung surgery, chemotherapy and XRT complete EXAM: CT CHEST WITH CONTRAST TECHNIQUE: Multidetector CT imaging of the chest was performed during intravenous contrast administration. CONTRAST:  8m ISOVUE-300 IOPAMIDOL (ISOVUE-300) INJECTION 61% COMPARISON:  07/19/2016 FINDINGS: Cardiovascular: Heart is normal in size.  No pericardial effusion. Mild atherosclerotic calcifications of the aortic arch. Mediastinum/Nodes: No suspicious mediastinal lymphadenopathy. Visualized thyroid is unremarkable. Lungs/Pleura: Status post right upper lobectomy. Radiation changes in right paramediastinal/perihilar region. Stable 2.0 cm ground-glass nodular opacity in the right lung apex (series 5/ image 35), favored to reflect radiation changes versus scarring. Left lung is clear. No focal consolidation. Trace right pleural fluid.  No pneumothorax. Upper Abdomen: 1.8 cm hyperdense/ hemorrhagic posterior left upper  pole renal cyst (series 2/ image 170). Musculoskeletal: Degenerative changes of the visualized thoracolumbar spine. IMPRESSION: Status post right upper lobectomy. Radiation changes in the right paramediastinal/perihilar region. No evidence of recurrent or metastatic disease. Electronically Signed   By: SJulian HyM.D.   On: 01/17/2017 15:31   I have independently reviewed the above radiology studies  and reviewed the findings with the patient. I agree with radiology report but am concerned the the right upper lobe ground glass nodularity is slightly more solid then 6 months ago.    Ct Chest W Contrast  Result Date: 07/19/2016 CLINICAL DATA:  Malignant neoplasm of right upper lobe. Restaging. Partial right-sided resection. Chemotherapy and radiation therapy completed 08/27/2013. EXAM: CT CHEST WITH CONTRAST TECHNIQUE: Multidetector CT imaging of the chest was performed during intravenous contrast administration. CONTRAST:  73mISOVUE-300 IOPAMIDOL (ISOVUE-300) INJECTION 61% COMPARISON:  01/26/2016 FINDINGS: Cardiovascular: Aortic and branch vessel atherosclerosis. Tortuous thoracic aorta. Normal  heart size, without pericardial effusion. No central pulmonary embolism, on this non-dedicated study. Mediastinum/Nodes: No supraclavicular adenopathy. No mediastinal or hilar adenopathy. Lungs/Pleura: Trace right-sided pleural fluid or thickening are similar. Status post right upper lobectomy. Similar configuration of right paramediastinal radiation fibrosis. Presumed scarring in the right lower lobe on image 85/ series 5 is unchanged. Mild right apical ground-glass opacity is not significantly changed on image 27/series 5 and also favored to represent scarring. Upper Abdomen: Normal imaged portions of the liver, spleen, stomach, pancreas, adrenal glands. Incompletely imaged left renal lesion measures 1.8 cm and greater than fluid density, similar to on the prior exam. Musculoskeletal: Moderate thoracic  spondylosis. No focal osseous lesion. IMPRESSION: 1. Status post right upper lobectomy. No evidence of recurrent or metastatic disease. 2. Incompletely imaged left renal lesion is again favored to represent a complex cyst, when compared to prior pre and postcontrast exams. Electronically Signed   By: Abigail Miyamoto M.D.   On: 07/19/2016 11:59   I have independently reviewed the above radiology studies  and reviewed the findings with the patient.  Ct Chest W Contrast  07/22/2015   CLINICAL DATA:  Subsequent evaluation of a 84 year old male with history of lung cancer diagnosed in March 2014 status post prior lung surgery as well as chemotherapy and radiation therapy now complete.  EXAM: CT CHEST WITH CONTRAST  TECHNIQUE: Multidetector CT imaging of the chest was performed during intravenous contrast administration.  CONTRAST:  59m OMNIPAQUE IOHEXOL 300 MG/ML  SOLN  COMPARISON:  Chest CT 01/14/2015.  FINDINGS: Mediastinum/Lymph Nodes: Heart size is normal. There is no significant pericardial fluid, thickening or pericardial calcification. No pathologically enlarged mediastinal or hilar lymph nodes. Esophagus is unremarkable in appearance. No axillary lymphadenopathy.  Lungs/Pleura: Status post right upper lobectomy. Extensive architectural distortion noted in the paramediastinal aspect of the remaining portions of the right middle and lower lobes, with a few patchy areas of surrounding ground-glass attenuation, similar to prior studies, most compatible with chronic postradiation changes. There is again a 10 x 5 mm area of nodular thickening of an accessory fissure in the right lower lobe posteriorly (image 37 of series 5), which is stable in appearance compared to prior study 07/11/2014, favored to represent an area of chronic scarring. No other suspicious appearing pulmonary nodules or masses are identified on today's examination. No acute consolidative airspace disease. No pleural effusions.  Upper Abdomen:  Exophytic incompletely visualized intermediate attenuation (29 HU) lesion in the upper pole of the left kidney is incompletely characterize, but is similar to prior studies, favored to represent a proteinaceous cyst.  Musculoskeletal/Soft Tissues: There are no aggressive appearing lytic or blastic lesions noted in the visualized portions of the skeleton.  IMPRESSION: 1. Stable examination demonstrating postoperative and postradiation changes in the right hemithorax, as above. No definite findings to suggest residual/ recurrent disease or metastatic disease in the thorax. 2. Additional incidental findings, as above.   Electronically Signed   By: DVinnie LangtonM.D.   On: 07/22/2015 15:44   Recent Labs: Lab Results  Component Value Date   WBC 8.5 01/26/2019   HGB 15.3 01/26/2019   HCT 48.7 01/26/2019   PLT 219 01/26/2019   GLUCOSE 98 01/26/2019   CHOL 206 (H) 06/19/2014   TRIG 216.0 (H) 06/19/2014   HDL 38.50 (L) 06/19/2014   LDLDIRECT 144.0 06/19/2014   ALT 18 01/26/2019   AST 16 01/26/2019   NA 142 01/26/2019   K 4.4 01/26/2019   CL 107 01/26/2019   CREATININE  0.99 01/26/2019   BUN 12 01/26/2019   CO2 28 01/26/2019   INR 0.99 02/20/2013   PATH: stage IIIA,  EGFR: Exon 19 deletion  Mutation detected Diagnosis 1. Lung, resection (segmental or lobe), Right upper lobe - ADENOCARCINOMA, 3.4 CM. - MARGINS NOT INVOLVED. - VISCERAL PLEURA FREE OF TUMOR. - ONE BENIGN LYMPH NODE. 2. Lymph node, biopsy, Right upper 4 R - METASTATIC ADENOCARCINOMA. 3. Lymph node, biopsy, Right 2 R - ANTHRACOTIC LYMPH NODE. NO TUMOR IDENTIFIED. Microscopic Comment 1. LUNG Specimen, including laterality: Right lung lobe. Procedure: Lobectomy. Specimen integrity (intact/disrupted): Intact. Tumor site: Right upper lobe. Tumor focality: Unifocal Maximum tumor size (cm): 3.4 cm. Histologic type: Adenocarcinoma, bronchioalveolar cell type. Grade: I. Margins: Free of tumor. Visceral pleura invasion:  No. Tumor extension: Within lobe. Treatment effect (if treated with neoadjuvant therapy): No. Lymph -Vascular invasion: Present. Lymph nodes: Number examined - 3 ; Number N1 nodes positive - 0 ; Number N2 nodes positive - 1 TNM code: pT2a , pN2, pMX Ancillary studies: EGFR and ALK pending. Non-neoplastic lung: Unremarkable     Assessment / Plan:     Stable following resection of right upper lobe, lung cancer which was found to be EGFR positive adenocarcinoma stage IIIa. Dx 2014  No evidence of recurrent disease on CT scan.  Now 6 years postop plan see the patient back in 1 year.  Patient has moved, discussed with him having a virtual visit in 1 year he will call at that time.     Grace Isaac 02/01/2019 2:46 PM

## 2019-05-07 ENCOUNTER — Other Ambulatory Visit: Payer: Self-pay | Admitting: Anesthesiology

## 2019-05-07 DIAGNOSIS — M5416 Radiculopathy, lumbar region: Secondary | ICD-10-CM

## 2019-05-08 ENCOUNTER — Other Ambulatory Visit: Payer: Self-pay

## 2019-05-08 ENCOUNTER — Ambulatory Visit
Admission: RE | Admit: 2019-05-08 | Discharge: 2019-05-08 | Disposition: A | Discharge status: Home / Self Care | Payer: Federal, State, Local not specified - PPO | Source: Ambulatory Visit | Attending: Anesthesiology | Admitting: Anesthesiology

## 2019-05-08 DIAGNOSIS — M5416 Radiculopathy, lumbar region: Secondary | ICD-10-CM

## 2019-05-15 ENCOUNTER — Other Ambulatory Visit: Payer: Self-pay | Admitting: Anesthesiology

## 2019-07-12 ENCOUNTER — Other Ambulatory Visit: Payer: Self-pay | Admitting: Podiatry

## 2019-07-12 ENCOUNTER — Ambulatory Visit: Payer: Medicare Other | Admitting: Podiatry

## 2019-07-12 ENCOUNTER — Ambulatory Visit (INDEPENDENT_AMBULATORY_CARE_PROVIDER_SITE_OTHER): Payer: Federal, State, Local not specified - PPO

## 2019-07-12 ENCOUNTER — Encounter: Payer: Self-pay | Admitting: Podiatry

## 2019-07-12 ENCOUNTER — Other Ambulatory Visit: Payer: Self-pay

## 2019-07-12 DIAGNOSIS — G5782 Other specified mononeuropathies of left lower limb: Secondary | ICD-10-CM

## 2019-07-12 DIAGNOSIS — Q828 Other specified congenital malformations of skin: Secondary | ICD-10-CM | POA: Diagnosis not present

## 2019-07-12 DIAGNOSIS — G5762 Lesion of plantar nerve, left lower limb: Secondary | ICD-10-CM | POA: Diagnosis not present

## 2019-07-12 DIAGNOSIS — M778 Other enthesopathies, not elsewhere classified: Secondary | ICD-10-CM

## 2019-07-14 ENCOUNTER — Encounter: Payer: Self-pay | Admitting: Podiatry

## 2019-07-14 NOTE — Progress Notes (Signed)
Presents today for forefoot third and fourth fifth toe pain.  States that the sharp intermittent pains with sensations x1 month states that there is a small callused area on the fourth and fifth toes where they rub together.  The callused area sub-fifth metatarsal phalangeal joint he tried Lotrimin between the toes nothing really seem to help.  Review of systems: Denies fever chills nausea vomiting muscle aches pains calf pain back pain chest pain shortness of breath.  I have reviewed his past medical history medications allergies surgery social history review of systems with no changes.  Objective: Vital signs are stable alert and oriented x3.  Pulses are palpable.  Neurologic sensorium is intact though he does have a palpable Mulder's click to the third interdigital space of the left foot with similar type of symptoms.  Otherwise orthopedic evaluation demonstrates all joints distal to the ankle full range of motion without crepitation.  Cutaneous evaluation demonstrates supple well-hydrated cutis reactive hyperkeratotic lesion medial aspect of the fifth digit is in association with the juxtaposition of the fourth toe.  Radiographs taken today do not demonstrate any acute findings.  Mild flexible hammertoe deformities with some osteoarthritic changes of the midfoot and toes.  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  Plan: Debridement of reactive hyperkeratotic tissue.  Also injected the third interdigital space with 10 mg Kenalog 5 mg Marcaine point maximal tenderness.

## 2019-08-09 ENCOUNTER — Encounter: Payer: Self-pay | Admitting: Podiatry

## 2019-08-09 ENCOUNTER — Ambulatory Visit (INDEPENDENT_AMBULATORY_CARE_PROVIDER_SITE_OTHER): Payer: Federal, State, Local not specified - PPO | Admitting: Podiatry

## 2019-08-09 ENCOUNTER — Other Ambulatory Visit: Payer: Self-pay

## 2019-08-09 DIAGNOSIS — Q828 Other specified congenital malformations of skin: Secondary | ICD-10-CM | POA: Diagnosis not present

## 2019-08-09 DIAGNOSIS — M7752 Other enthesopathy of left foot: Secondary | ICD-10-CM

## 2019-08-09 DIAGNOSIS — G5762 Lesion of plantar nerve, left lower limb: Secondary | ICD-10-CM

## 2019-08-09 DIAGNOSIS — G5782 Other specified mononeuropathies of left lower limb: Secondary | ICD-10-CM

## 2019-08-09 NOTE — Progress Notes (Signed)
He presents today for follow-up of neuroma third interdigital space of the left foot states that it really has not been hurting him too much.  States that the majority of the pain is beneath the fifth digit left foot.  Objective: Vital signs are stable alert and oriented x3.  Pulses are palpable.  Has a small area of bursitis beneath the fifth metatarsal of the left foot.  Reactive hyper keratoma is also present.  Assessment: Plantarflexed fifth metatarsal with bursitis and porokeratotic lesion.  Plan: We did not inject the neuroma today because he was doing much better but we did discuss the possible need of dehydrated alcohol injections.  I also injected Kenalog beneath the fifth metatarsal phalangeal joint of the left foot and debrided the reactive hyperkeratotic tissue.

## 2019-10-09 ENCOUNTER — Ambulatory Visit: Payer: Federal, State, Local not specified - PPO | Admitting: Podiatry

## 2020-01-28 ENCOUNTER — Inpatient Hospital Stay: Payer: Federal, State, Local not specified - PPO | Attending: Internal Medicine

## 2020-01-28 ENCOUNTER — Other Ambulatory Visit: Payer: Self-pay

## 2020-01-28 ENCOUNTER — Encounter (HOSPITAL_COMMUNITY): Payer: Self-pay | Admitting: Radiology

## 2020-01-28 ENCOUNTER — Ambulatory Visit (HOSPITAL_COMMUNITY)
Admission: RE | Admit: 2020-01-28 | Discharge: 2020-01-28 | Disposition: A | Discharge status: Home / Self Care | Payer: Federal, State, Local not specified - PPO | Source: Ambulatory Visit | Attending: Internal Medicine | Admitting: Internal Medicine

## 2020-01-28 DIAGNOSIS — Z85118 Personal history of other malignant neoplasm of bronchus and lung: Secondary | ICD-10-CM | POA: Diagnosis not present

## 2020-01-28 DIAGNOSIS — Z923 Personal history of irradiation: Secondary | ICD-10-CM | POA: Insufficient documentation

## 2020-01-28 DIAGNOSIS — Z9221 Personal history of antineoplastic chemotherapy: Secondary | ICD-10-CM | POA: Insufficient documentation

## 2020-01-28 DIAGNOSIS — C3490 Malignant neoplasm of unspecified part of unspecified bronchus or lung: Secondary | ICD-10-CM | POA: Diagnosis not present

## 2020-01-28 DIAGNOSIS — Z79899 Other long term (current) drug therapy: Secondary | ICD-10-CM | POA: Diagnosis not present

## 2020-01-28 LAB — CBC WITH DIFFERENTIAL (CANCER CENTER ONLY)
Abs Immature Granulocytes: 0.04 10*3/uL (ref 0.00–0.07)
Basophils Absolute: 0.1 10*3/uL (ref 0.0–0.1)
Basophils Relative: 1 %
Eosinophils Absolute: 0.3 10*3/uL (ref 0.0–0.5)
Eosinophils Relative: 3 %
HCT: 48.4 % (ref 39.0–52.0)
Hemoglobin: 15.1 g/dL (ref 13.0–17.0)
Immature Granulocytes: 0 %
Lymphocytes Relative: 19 %
Lymphs Abs: 1.8 10*3/uL (ref 0.7–4.0)
MCH: 27.5 pg (ref 26.0–34.0)
MCHC: 31.2 g/dL (ref 30.0–36.0)
MCV: 88 fL (ref 80.0–100.0)
Monocytes Absolute: 0.7 10*3/uL (ref 0.1–1.0)
Monocytes Relative: 7 %
Neutro Abs: 6.4 10*3/uL (ref 1.7–7.7)
Neutrophils Relative %: 70 %
Platelet Count: 206 10*3/uL (ref 150–400)
RBC: 5.5 MIL/uL (ref 4.22–5.81)
RDW: 14 % (ref 11.5–15.5)
WBC Count: 9.3 10*3/uL (ref 4.0–10.5)
nRBC: 0 % (ref 0.0–0.2)

## 2020-01-28 LAB — CMP (CANCER CENTER ONLY)
ALT: 11 U/L (ref 0–44)
AST: 13 U/L — ABNORMAL LOW (ref 15–41)
Albumin: 3.8 g/dL (ref 3.5–5.0)
Alkaline Phosphatase: 56 U/L (ref 38–126)
Anion gap: 7 (ref 5–15)
BUN: 15 mg/dL (ref 8–23)
CO2: 26 mmol/L (ref 22–32)
Calcium: 8.7 mg/dL — ABNORMAL LOW (ref 8.9–10.3)
Chloride: 107 mmol/L (ref 98–111)
Creatinine: 1.06 mg/dL (ref 0.61–1.24)
GFR, Est AFR Am: >60 mL/min (ref >60)
GFR, Estimated: >60 mL/min (ref >60)
Glucose, Bld: 94 mg/dL (ref 70–99)
Potassium: 4.6 mmol/L (ref 3.5–5.1)
Sodium: 140 mmol/L (ref 135–145)
Total Bilirubin: 1.5 mg/dL — ABNORMAL HIGH (ref 0.3–1.2)
Total Protein: 6.6 g/dL (ref 6.5–8.1)

## 2020-01-28 MED ORDER — SODIUM CHLORIDE (PF) 0.9 % IJ SOLN
INTRAMUSCULAR | Status: AC
  Filled 2020-01-28: qty 50

## 2020-01-28 MED ORDER — IOHEXOL 300 MG/ML  SOLN
75.0000 mL | Freq: Once | INTRAMUSCULAR | Status: AC | PRN
  Administered 2020-01-28: 75 mL via INTRAVENOUS

## 2020-01-30 ENCOUNTER — Encounter: Payer: Self-pay | Admitting: Internal Medicine

## 2020-01-30 ENCOUNTER — Other Ambulatory Visit: Payer: Self-pay

## 2020-01-30 ENCOUNTER — Inpatient Hospital Stay: Payer: Federal, State, Local not specified - PPO | Admitting: Internal Medicine

## 2020-01-30 DIAGNOSIS — C3490 Malignant neoplasm of unspecified part of unspecified bronchus or lung: Secondary | ICD-10-CM | POA: Diagnosis not present

## 2020-01-30 DIAGNOSIS — Z85118 Personal history of other malignant neoplasm of bronchus and lung: Secondary | ICD-10-CM | POA: Diagnosis not present

## 2020-01-30 NOTE — Progress Notes (Signed)
[facility] [phone]   [phone]  OFFICE PROGRESS NOTE  Crist Infante, MD [place] [address]  DIAGNOSIS AND STAGE: Stage III A (T2a, N2, M0) non-small cell lung cancer, adenocarcinoma with positive EGFR mutation in exon 19 and negative ALK gene translocation diagnosed in April 2014   PRIOR THERAPY:  1) Status post right upper lobectomy with lymph node dissection under the care of Dr. Servando Snare on 02/21/2013. 2) Systemic adjuvant chemotherapy with carboplatin for AUC of 5 and Alimta 500 mg/M2 every 3 weeks. Status post 4 cycles. Last cycle was given on 05/29/2013. (Carboplatin was used in place of cisplatin secondary to concern about intolerability and significant adverse effects of cisplatin).  3) Adjuvant radiotherapy to the mediastinum under the care of Dr. Lisbeth Renshaw completed 08/27/2013.   CURRENT THERAPY: Observation  CHEMOTHERAPY INTENT: adjuvant/curative  CURRENT # OF CHEMOTHERAPY CYCLES: 0 CURRENT ANTIEMETICS: Zofran, Decadron and Compazine  CURRENT SMOKING STATUS: currently a nonsmoker  ORAL CHEMOTHERAPY AND CONSENT: None  CURRENT BISPHOSPHONATES USE: None  PAIN MANAGEMENT: no pain  NARCOTICS INDUCED CONSTIPATION: N/A  LIVING WILL AND CODE STATUS: Full Code.   INTERVAL HISTORY: Alvin Hardy 85 y.o. male returns to the clinic today for annual follow-up visit.  The patient is feeling fine today with no concerning complaints.  He received his 2 Covid vaccine shots recently.  The patient denied having any current chest pain, shortness of breath, cough or hemoptysis.  He denied having any fever or chills.  He has no nausea, vomiting, diarrhea or constipation.  He denied having any headache or visual changes.  He had repeat CT scan of the chest performed recently and he is here for evaluation and discussion of his scan results.  MEDICAL HISTORY: Past Medical History:  Diagnosis Date  . Allergy   . Arthritis     psoriatic arthritis, ? , treated /w mmethotrexate   . Bipolar 1 disorder ([place])   . BPH (benign prostatic hyperplasia)   . Cataract 2010   Bilateral  . Chest pain   . Depression   . Deviated septum    TO THE LEFT  . ED (erectile dysfunction)   . Finger injury    mallet finger-- 02/13/2013, cast in place, followed by dr. Fredna Dow  . GERD (gastroesophageal reflux disease)   . Gilbert's syndrome   . History of radiation therapy 07/24/13-08/27/13   50Gy/25 fx chest  . Hyperlipidemia   . Hypogonadism male   . Laryngopharyngeal reflux   . lung ca dx'd 12/2012   rul  . Lung mass 02/07/13   RIGHT UPPER LOBE  . Morton's neuroma    LEFT FOOT  . Neuromuscular disorder ([place])    peripheral neuropathy  . Shortness of breath    voice breaks, ? related to reflux, although told by Dr. Ardis Hughs, S.- early 68 yr. old    ALLERGIES:  is allergic to codeine; hydrocodone-acetaminophen; omeprazole magnesium; tramadol; decadron [dexamethasone]; fenofibrate; neomycin-bacitracin zn-polymyx; other; penicillin g; propoxyphene; statins; ubidecarenone; zetia [ezetimibe]; and zoloft [sertraline hcl].  MEDICATIONS:  Current Outpatient Medications  Medication Sig Dispense Refill  . aspirin EC 81 MG tablet Take 81 mg by mouth every morning.     . Calcium Carbonate-Vitamin D (CALTRATE 600+D) 600-400 MG-UNIT per tablet Take 1 tablet by mouth every morning.    . Cholecalciferol (VITAMIN D-3) 1000 UNITS CAPS Take 1,000 Units by mouth every morning.     . clobetasol cream (TEMOVATE) 3.09 % Apply 1 application  topically daily. Reported on 01/27/2016    . clonazePAM (KLONOPIN) 0.5 MG tablet Take 0.5 mg by mouth at bedtime.    . cyanocobalamin 1000 MCG tablet Take 1,000 mcg by mouth every morning.     Marland Kitchen esomeprazole (NEXIUM) 40 MG capsule Take 40 mg by mouth daily before breakfast.    . finasteride (PROSCAR) 5 MG tablet Take 5 mg by mouth daily before breakfast.     . gabapentin (NEURONTIN) 300 MG capsule Take 600-900 mg by  mouth 2 (two) times daily. He takes two capsules every morning and three capsules at bedtime.    Marland Kitchen lithium carbonate (LITHOBID) 300 MG CR tablet Take 600 mg by mouth at bedtime.     Marland Kitchen MELATONIN PO Take by mouth.    . Multiple Vitamin (MULTIVITAMIN WITH MINERALS) TABS tablet Take 1 tablet by mouth every morning.    Marland Kitchen OVER THE COUNTER MEDICATION Take 1 tablet by mouth 2 (two) times daily. Glucosamine Chondroitin 1581m/1200mg    . Probiotic Product (PROBIOTIC DAILY PO) Take 1 each by mouth daily.    . propranolol (INDERAL) 10 MG tablet Take 10-20 mg by mouth daily as needed (For extremely stressful circumstances.).     .Marland KitchenREPATHA SURECLICK 1893MG/ML SOAJ Every 15 days    . tamsulosin (FLOMAX) 0.4 MG CAPS capsule Take 0.4 mg by mouth daily.  3  . terbinafine (LAMISIL) 250 MG tablet Take 250 mg by mouth daily.    .Marland Kitchentestosterone cypionate (DEPOTESTOTERONE CYPIONATE) 200 MG/ML injection Inject 0.5 mLs into the muscle every 21 ( twenty-one) days. Now 3075mml  1  . vardenafil (LEVITRA) 5 MG tablet Take 5 mg by mouth daily as needed for erectile dysfunction.      No current facility-administered medications for this visit.    SURGICAL HISTORY:  Past Surgical History:  Procedure Laterality Date  . CYSTOSCOPY N/A 02/21/2013   Procedure: CYSTOSCOPY FLEXIBLE with insertion of foley catheter;  Surgeon: MaFredricka BonineMD;  [facility] Service[facility] Urology;  Laterality: N/A;  . EYE SURGERY     cataracts removed fr. both eyes, IOL in place   . KNEE ARTHROSCOPY Right 01/2007  . PILONIDAL CYST EXCISION  1960's  . [place]  . VIDEO ASSISTED THORACOSCOPY (VATS)/WEDGE RESECTION Right 02/21/2013   Procedure: Right VIDEO ASSISTED THORACOSCOPY ,Thoracotomy with right upper lobectomy, node sampling ;  Surgeon: EdGrace IsaacMD;  [facility];  Laterality: Right;  . Marland KitchenIDEO BRONCHOSCOPY N/A 02/21/2013   Procedure: VIDEO BRONCHOSCOPY;  Surgeon: EdGrace IsaacMD;  [facility];  Service: Thoracic;  Laterality: N/A;    REVIEW OF SYSTEMS:  A comprehensive review of systems was negative.   PHYSICAL EXAMINATION: General appearance: alert, cooperative and no distress Head: Normocephalic, without obvious abnormality, atraumatic Neck: no adenopathy Lymph nodes: Cervical, supraclavicular, and axillary nodes normal. Resp: clear to auscultation bilaterally Back: symmetric, no curvature. ROM normal. No CVA tenderness. Cardio: regular rate and rhythm, S1, S2 normal, no murmur, click, rub or gallop GI: soft, non-tender; bowel sounds normal; no masses,  no organomegaly Extremities: extremities normal, atraumatic, no cyanosis or edema  ECOG PERFORMANCE STATUS: 0 - Asymptomatic  Blood pressure 129/71, pulse 73, temperature 98.2 F (36.8 C), temperature source Temporal, resp. rate 18, height '5\' 11"'  (1.803 m), weight 189 lb 3.2 oz (85.8 kg), SpO2 96 %.  LABORATORY DATA: Lab Results  Component Value Date   WBC 9.3 01/28/2020   HGB  15.1 01/28/2020   HCT 48.4 01/28/2020   MCV 88.0 01/28/2020   PLT 206 01/28/2020      Chemistry      Component Value Date/Time   NA 140 01/28/2020 0914   NA 142 01/17/2017 1125   K 4.6 01/28/2020 0914   K 4.6 01/17/2017 1125   CL 107 01/28/2020 0914   CL 107 04/24/2013 1110   CO2 26 01/28/2020 0914   CO2 26 01/17/2017 1125   BUN 15 01/28/2020 0914   BUN 13.0 01/17/2017 1125   CREATININE 1.06 01/28/2020 0914   CREATININE 1.0 01/17/2017 1125      Component Value Date/Time   CALCIUM 8.7 (L) 01/28/2020 0914   CALCIUM 9.1 01/17/2017 1125   ALKPHOS 56 01/28/2020 0914   ALKPHOS 59 01/17/2017 1125   AST 13 (L) 01/28/2020 0914   AST 15 01/17/2017 1125   ALT 11 01/28/2020 0914   ALT 18 01/17/2017 1125   BILITOT 1.5 (H) 01/28/2020 0914   BILITOT 1.57 (H) 01/17/2017 1125       RADIOGRAPHIC STUDIES: CT Chest W Contrast  Result Date: 01/28/2020 CLINICAL DATA:  Non-small cell lung cancer staging.  Chemotherapy and radiation therapy completed 08/27/2013. EXAM: CT CHEST WITH CONTRAST TECHNIQUE: Multidetector CT imaging of the chest was performed during intravenous contrast administration. CONTRAST:  35m OMNIPAQUE IOHEXOL 300 MG/ML  SOLN COMPARISON:  01/26/2019. FINDINGS: Cardiovascular: Atherosclerotic calcification of the aorta and coronary arteries. Heart size normal. No pericardial effusion. Mediastinum/Nodes: 11 mm low-attenuation left thyroid nodule. No follow-up recommended. (Ref: J Am Coll Radiol. 2015 Feb;12(2): 143-50).No pathologically enlarged mediastinal, hilar or axillary lymph nodes. Esophagus is unremarkable. Lungs/Pleura: Platelike scarring in the superior segment right lower lobe (7/32), stable. Right upper lobectomy with post treatment volume loss and bronchiectasis in the right perihilar region. Trace pleural fluid at the base of the right hemithorax. Left lung is clear. Airway is otherwise unremarkable. Upper Abdomen: Liver, gallbladder and adrenal glands are unremarkable. Hyperdense 1.7 cm lesion off the upper pole left kidney appears chronically stable and is likely a hyperdense cyst. Definitive characterization is limited without precontrast imaging. Visualized portions of the kidneys, spleen, pancreas, stomach and bowel are otherwise unremarkable with exception of a small hiatal hernia. Gastric wall thickening may be due to underdistention. No upper abdominal adenopathy. Musculoskeletal: Degenerative changes in the spine. No worrisome lytic or sclerotic lesions. Thoracotomy defects. IMPRESSION: 1. Right upper lobectomy with post radiation volume loss in the right perihilar region, stable. No evidence of metastatic disease. 2. Trace right pleural fluid. 3. Aortic atherosclerosis (ICD10-I70.0). Coronary artery calcification. Electronically Signed   By: MLorin PicketM.D.   On: 01/28/2020 11:20   ASSESSMENT AND PLAN: This is a very pleasant 85years old white male with history of  stage IIIa non-small cell lung cancer, adenocarcinoma with positive EGFR mutation in exon 19 status post right upper lobectomy with lymph node dissection followed by 4 cycles of adjuvant systemic chemotherapy followed by adjuvant radiation to the mediastinum. The patient has been in observation since that time.  He is doing fine with no concerning complaints. He had repeat CT scan of the chest performed recently.  I personally and independently reviewed the scan and discussed the results with the patient today. His scan showed no concerning findings for disease recurrence or metastasis. I recommended for him to continue on observation with repeat CT scan of the chest in 1 year. He was advised to call immediately if he has any concerning symptoms in the interval. The  patient voices understanding of current disease status and treatment options and is in agreement with the current care plan. All questions were answered. The patient knows to call the clinic with any problems, questions or concerns. We can certainly see the patient much sooner if necessary.   Disclaimer: This note was dictated with voice recognition software. Similar sounding words can inadvertently be transcribed and may not be corrected upon review.

## 2020-01-31 ENCOUNTER — Telehealth: Payer: Federal, State, Local not specified - PPO | Admitting: Cardiothoracic Surgery

## 2020-01-31 ENCOUNTER — Telehealth: Payer: Self-pay | Admitting: Internal Medicine

## 2020-01-31 ENCOUNTER — Telehealth: Payer: Self-pay | Admitting: Cardiothoracic Surgery

## 2020-01-31 NOTE — Telephone Encounter (Signed)
Scheduled per los. Called and left msg. Mailed printout  °

## 2020-01-31 NOTE — Telephone Encounter (Signed)
Call patient and reviewed his recent CT is read since CT scan of the chest-there is no evidence of recurrence now 7 years post resection chemotherapy and radiation therapy. He has a follow-up scan arranged through medical oncology for next year. Can be seen by surgery as needed.  Grace Isaac MD      [address] [place] [address] Office [phone]

## 2020-09-08 ENCOUNTER — Ambulatory Visit (INDEPENDENT_AMBULATORY_CARE_PROVIDER_SITE_OTHER): Payer: Federal, State, Local not specified - PPO | Admitting: Neurology

## 2020-09-08 ENCOUNTER — Other Ambulatory Visit: Payer: Self-pay

## 2020-09-08 ENCOUNTER — Encounter: Payer: Self-pay | Admitting: Neurology

## 2020-09-08 VITALS — BP 138/88 | HR 100 | Ht 70.0 in | Wt 186.0 lb

## 2020-09-08 DIAGNOSIS — R0683 Snoring: Secondary | ICD-10-CM | POA: Diagnosis not present

## 2020-09-08 DIAGNOSIS — R351 Nocturia: Secondary | ICD-10-CM | POA: Diagnosis not present

## 2020-09-08 DIAGNOSIS — I700 Atherosclerosis of aorta: Secondary | ICD-10-CM

## 2020-09-08 DIAGNOSIS — Z85118 Personal history of other malignant neoplasm of bronchus and lung: Secondary | ICD-10-CM | POA: Diagnosis not present

## 2020-09-08 DIAGNOSIS — F319 Bipolar disorder, unspecified: Secondary | ICD-10-CM

## 2020-09-08 NOTE — Progress Notes (Signed)
SLEEP MEDICINE CLINIC    Provider:  Larey Seat, MD  Primary Care Physician:  Crist Infante, MD [place] [address]     Referring Provider: Crist Infante, [address] [place],  [place] [address]          Chief Complaint according to patient   Patient presents with:    . New Patient (Initial Visit)           HISTORY OF PRESENT ILLNESS:  Alvin Hardy is a 85 y.o. year old White or Caucasian male patient seen here upon a consulation referral on 09/08/2020 from  Dr. Crist Infante.  Chief concern according to patient :  " Dr Joylene Draft has been my physician since 2003 and I have had good sleep since".  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 Has a Social worker.   I have the pleasure of seeing Alvin Hardy today, a right*-handed White or Caucasian male who stated he has no trouble with sleep and doesn't know why he is here. He  has a past medical history of Allergy, Arthritis, Bipolar 1 disorder ([place]), BPH (benign prostatic hyperplasia), Cataract (2010), Chest pain, Depression, Deviated septum, ED (erectile dysfunction), Finger injury, GERD (gastroesophageal reflux disease), Gilbert's syndrome, History of radiation therapy (07/24/13-08/27/13), Hyperlipidemia, Hypogonadism male, Laryngopharyngeal reflux, lung ca (dx'd 12/2012), Lung mass (02/07/13), Morton's neuroma, Neuromuscular disorder ([place]), and Shortness of breath. The patient has struggled in the past with insomnia for years and states the current medication regimen is working for him. Gabapentin 1200mg  QHS, Lithium 600 mg QHS and clonazepam 0.5 HSpt has struggled with insomnia for years. he has never had a SS and states that he has no interest in completing a SS. states  current medication regimen he is on is working for him. Gabapentin 1200mg  QHS, Lithium 600 mg QHS and clonazepam 0.5 mg qhs.    Sleep relevant medical history: see above  Family medical /sleep history: no other family member on CPAP with OSA, with insomnia, or with sleep walking.    Social history:  Patient is retired from being a Psychologist, prison and probation services- and lives in a household with his second spouse. Family status is remarried  with 2 sons from the first marriage , one is alcoholic, one is not speaking to him.  Pets are present- one dog.Tobacco use;  Quit 1980.  ETOH use ; rarely since living at [facility] , 2 -4 a month.  Caffeine intake in form of Coffee( 2 cups in AM ) Soda( sometimes ) Tea ( /) or energy drinks. Regular exercise in form of walking.  He has a Physiological scientist. He plays piano, and loves to read.    Sleep habits are as follows: The patient's dinner time is between 6- 6.30  PM.  He reports he has a very comfy chair and when he sits in it he will sleep- he goes there to read and nap. The patient goes to bed at 10.30 PM and is asleep by midnight- wife insists on watching TV- he continues to sleep for 90 minutes, gets up and gets Cocoa, then sleeps again. Total time asleep is usually 7-8 hours.  The preferred sleep position is reclined, or on either side, with  the support of 2 pillows.  Dreams are reportedly rare. 09.30  AM is the usual rise time. The patient wakes up spontaneously.  He reports usually feeling refreshed or restored in AM, with symptoms such as dry mouth.  Naps are taken frequently, lasting from 45-60 minutes and are more refreshing than nocturnal sleep.    Review of Systems: Out of a complete 14 system review, the patient complains of only the following symptoms, and all other reviewed systems are negative.:  Fatigue, snoring, fragmented sleep- Nocturia 3-4 times each night-  In 90 minute portions.  No struggles to return to sleep.    How likely are you to doze in the  following situations: 0 = not likely, 1 = slight chance, 2 = moderate chance, 3 = high chance   Sitting and Reading? Watching Television? Sitting inactive in a public place (theater or meeting)? As a passenger in a car for an hour without a break? Lying down in the afternoon when circumstances permit? Sitting and talking to someone? Sitting quietly after lunch without alcohol? In a car, while stopped for a few minutes in traffic?   Total = 9/ 24 points   FSS endorsed at 11/ 63 points.  GDS - 1/ 15 points.   Social History   Socioeconomic History  . Marital status: Married    Spouse name: Not on file  . Number of children: Not on file  . Years of education: Not on file  . Highest education level: Not on file  Occupational History  . Not on file  Tobacco Use  . Smoking status: Former Smoker    Types: Cigarettes    Quit date: 11/01/1978    Years since quitting: 41.8  . Smokeless tobacco: Never Used  Substance and Sexual Activity  . Alcohol use: Yes    Comment: occas.  . Drug use: No  . Sexual activity: Not on file  Other Topics Concern  . Not on file  Social History Narrative  . Not on file   Social Determinants of Health   Financial Resource Strain:   . Difficulty of Paying Living Expenses: Not on file  Food Insecurity:   . Worried About Charity fundraiser in the Last Year: Not on file  . Ran Out of Food in the Last Year: Not on file  Transportation Needs:   . Lack of Transportation (Medical): Not on file  . Lack of Transportation (Non-Medical): Not on file  Physical Activity:   . Days of Exercise per Week: Not on file  . Minutes of Exercise per Session: Not on file  Stress:   . Feeling of Stress : Not on file  Social Connections:   . Frequency of Communication with Friends and Family: Not on file  . Frequency of Social Gatherings with Friends and Family: Not on file  . Attends Religious Services: Not on file  . Active Member of Clubs or Organizations: Not on  file  . Attends Archivist Meetings: Not on file  . Marital Status: Not on file    Family History  Problem Relation Age of Onset  . Diabetes Mother   . Cancer Mother        abdomen  . Heart disease Father   . Diabetes Son   . Cancer Maternal Aunt        lung ca, smoker    Past Medical History:  Diagnosis Date  . Allergy   . Arthritis    psoriatic arthritis, ? ,  treated /w mmethotrexate   . Bipolar 1 disorder ([place])   . BPH (benign prostatic hyperplasia)   . Cataract 2010   Bilateral  . Chest pain   . Depression   . Deviated septum    TO THE LEFT  . ED (erectile dysfunction)   . Finger injury    mallet finger-- 02/13/2013, cast in place, followed by dr. Fredna Dow  . GERD (gastroesophageal reflux disease)   . Gilbert's syndrome   . History of radiation therapy 07/24/13-08/27/13   50Gy/25 fx chest  . Hyperlipidemia   . Hypogonadism male   . Laryngopharyngeal reflux   . lung ca dx'd 12/2012   rul  . Lung mass 02/07/13   RIGHT UPPER LOBE  . Morton's neuroma    LEFT FOOT  . Neuromuscular disorder ([place])    peripheral neuropathy  . Shortness of breath    voice breaks, ? related to reflux, although told by Dr. Ardis Hughs, S.- early 35 yr. old    Past Surgical History:  Procedure Laterality Date  . CYSTOSCOPY N/A 02/21/2013   Procedure: CYSTOSCOPY FLEXIBLE with insertion of foley catheter;  Surgeon: Fredricka Bonine, MD;  Location: [facility];  Service: Urology;  Laterality: N/A;  . EYE SURGERY     cataracts removed fr. both eyes, IOL in place   . KNEE ARTHROSCOPY Right 01/2007  . PILONIDAL CYST EXCISION  1960's  . [place]   TURP  . VIDEO ASSISTED THORACOSCOPY (VATS)/WEDGE RESECTION Right 02/21/2013   Procedure: Right VIDEO ASSISTED THORACOSCOPY ,Thoracotomy with right upper lobectomy, node sampling ;  Surgeon: Grace Isaac, MD;  Location: [facility];  Service: Thoracic;  Laterality: Right;  Marland Kitchen VIDEO BRONCHOSCOPY N/A 02/21/2013   Procedure: VIDEO  BRONCHOSCOPY;  Surgeon: Grace Isaac, MD;  Location: [facility] OR;  Service: Thoracic;  Laterality: N/A;     Current Outpatient Medications on File Prior to Visit  Medication Sig Dispense Refill  . aspirin EC 81 MG tablet Take 81 mg by mouth every morning.     . Calcium Carbonate-Vitamin D (CALTRATE 600+D) 600-400 MG-UNIT per tablet Take 1 tablet by mouth every morning.    . Cholecalciferol (VITAMIN D-3) 1000 UNITS CAPS Take 1,000 Units by mouth every morning.     . clobetasol cream (TEMOVATE) 1.61 % Apply 1 application topically daily. Reported on 01/27/2016    . clonazePAM (KLONOPIN) 0.5 MG tablet Take 0.5 mg by mouth at bedtime.    . cyanocobalamin 1000 MCG tablet Take 1,000 mcg by mouth every morning.     Marland Kitchen esomeprazole (NEXIUM) 40 MG capsule Take 40 mg by mouth daily before breakfast.    . finasteride (PROSCAR) 5 MG tablet Take 5 mg by mouth daily before breakfast.     . gabapentin (NEURONTIN) 300 MG capsule Take 1,200 mg by mouth at bedtime. He takes two capsules every morning and three capsules at bedtime.    Marland Kitchen lithium carbonate (LITHOBID) 300 MG CR tablet Take 600 mg by mouth at bedtime.     Marland Kitchen MELATONIN PO Take by mouth.    . Multiple Vitamin (MULTIVITAMIN WITH MINERALS) TABS tablet Take 1 tablet by mouth every morning.    Marland Kitchen OVER THE COUNTER MEDICATION Take 1 tablet by mouth 2 (two) times daily. Glucosamine Chondroitin 1500mg /1200mg     . Probiotic Product (PROBIOTIC DAILY PO) Take 1 each by mouth daily.    . propranolol (INDERAL) 10 MG tablet Take 10-20 mg by mouth daily as needed (For extremely stressful circumstances.).     Marland Kitchen  REPATHA SURECLICK 161 MG/ML SOAJ Every 15 days    . tamsulosin (FLOMAX) 0.4 MG CAPS capsule Take 0.4 mg by mouth daily.  3  . terbinafine (LAMISIL) 250 MG tablet Take 250 mg by mouth daily.    Marland Kitchen testosterone cypionate (DEPOTESTOTERONE CYPIONATE) 200 MG/ML injection Inject 0.5 mLs into the muscle every 21 ( twenty-one) days. Now 300mg /ml  1  . vardenafil (LEVITRA)  5 MG tablet Take 5 mg by mouth daily as needed for erectile dysfunction.      No current facility-administered medications on file prior to visit.    Allergies  Allergen Reactions  . Codeine Anaphylaxis  . Hydrocodone-Acetaminophen     Other reaction(s): Other (See Comments) Reactions unknown  . Omeprazole Magnesium     Other reaction(s): Other (See Comments) Reaction Unknown  . Tramadol     Other reaction(s): Other (See Comments) Reactions Unknown  . Decadron [Dexamethasone]     Pt says it is contraindication due to the lithium he is taking  . Fenofibrate     Other reaction(s): Other (See Comments) Reactions Unknown  . Neomycin-Bacitracin Zn-Polymyx     Other reaction(s): Other (See Comments) Reactions Unknown  . Other     Pt. Remarks that all pain med. Make him nauseated   . Penicillin G     Other reaction(s): Other (See Comments) Reactions Unknown  . Propoxyphene     Other reaction(s): Other (See Comments) Reaction Unknown  . Statins     Lipitor, Crestor, Zocor all caused muscle aches  . Ubidecarenone     Other reaction(s): Other (See Comments) Reactions Unknown  . Zetia [Ezetimibe]     Muscle aches  . Zoloft [Sertraline Hcl] Other (See Comments)    REACTION:  unknown    Physical exam:  Today's Vitals   09/08/20 1246  BP: 138/88  Pulse: 100  Weight: 186 lb (84.4 kg)  Height: 5\' 10"  (1.778 m)   Body mass index is 26.69 kg/m.   Wt Readings from Last 3 Encounters:  09/08/20 186 lb (84.4 kg)  01/30/20 189 lb 3.2 oz (85.8 kg)  01/27/18 189 lb 6.4 oz (85.9 kg)     Ht Readings from Last 3 Encounters:  09/08/20 5\' 10"  (1.778 m)  01/30/20 5\' 11"  (1.803 m)  01/27/18 5\' 11"  (1.803 m)      General: The patient is awake, alert and appears not in acute distress.  The patient is well groomed. Head: Normocephalic, atraumatic. Neck is supple. Mallampati 2  neck circumference:17.5 inches.  Nasal airflow is patent.   Retrognathia is not seen.  Dental status:  n/a  Cardiovascular:  Regular rate and cardiac rhythm by pulse,  without distended neck veins. Respiratory: Lungs are clear to auscultation.  Skin:  Without evidence of ankle edema, or rash. Trunk: The patient's posture is erect.   Neurologic exam : The patient is awake and alert, oriented to place and time.   Memory subjective described as intact.  Attention span & concentration ability appears normal.  Speech is fluent,  without  dysarthria, dysphonia or aphasia.  Mood and affect are appropriate.   Cranial nerves: no loss of smell or taste reported - fully vaccinated. Just got booster.  Pupils are equal and briskly reactive to light. Funduscopic exam deferred.  Extraocular movements in vertical and horizontal planes were intact and without nystagmus. No Diplopia. Visual fields by finger perimetry are intact. Hearing was impaired. Facial sensation intact to fine touch.  Facial motor strength is symmetric and tongue and  uvula move in midline.  Neck ROM : rotation, tilt and flexion extension were normal for age and shoulder shrug was symmetrical.    Motor exam:  Symmetric bulk, tone and ROM.   Normal tone without cogwheeling, symmetric grip strength . pronator drift noted on the right.    Sensory:   vibration in both feet, numbness from toe to ankles.  Proprioception tested in the upper extremities was normal.   Coordination: Rapid alternating movements in the fingers/hands were of normal speed.  The Finger-to-nose maneuver was intact without evidence of ataxia, dysmetria but with symmetric tremor.  Gait and station: Patient could rise unassisted from a seated position, walked without assistive device.  Stance is of normal width/ base and the patient turned with 3- steps.  Toe and heel walk were deferred.  Deep tendon reflexes: in the  upper and lower extremities are symmetric and intact.  Babinski response was deferred.      After spending a total time of 45 minutes face to face  and additional time for physical and neurologic examination, review of laboratory studies,  personal review of imaging studies, reports and results of other testing and review of referral information / records as far as provided in visit, I have established the following assessments:  1) This patient with a longstanding sleep fragmentation, caused by nocturia and partially by external sleep hygiene factors. No HYPERSOMNIA according to him. Takes a daily nap. Snores when on his back.   2) He reports a remote problem with insomnia. No presently.    3) Lithium tremor and nocturia.   4) history of LUNG Cancer , surgery and radiation and chemotherapy.  5) laryngeal acid reflux and cough, also at night.     My Plan is to proceed with:  1) sleep test to screen for pulmonary related hypoxia, for sleep apnea.     I would like to thank  Crist Infante, [address],  [place] [address] for allowing me to meet with and to take care of this pleasant patient.   In short, Alvin Hardy is presenting with sleep fragmentation but  Denies being bothered by it and doesn't feel excessively daytime sleepy.  I plan to follow up either personally or through our NP within 3-4 month.   CC: I will share my notes with PCP.  Electronically signed by: Larey Seat, MD 09/08/2020 1:05 PM  [facility] Board certified by The [employer] of Sleep Medicine and Diplomate of the [employer] of Sleep Medicine. Board certified In Neurology through the [employer] [place], Fellow of the [employer] of Neurology. Medical Director of [facility].

## 2020-09-08 NOTE — Patient Instructions (Signed)

## 2020-09-10 ENCOUNTER — Telehealth: Payer: Self-pay

## 2020-09-10 NOTE — Telephone Encounter (Signed)
Pt is not interested in scheduling sleep study. He states that he is 85 yo and doesn't believe he has OSA. Claims he has bad habits. Informed pt to give me a call back if he changes his mind.

## 2021-01-20 ENCOUNTER — Telehealth: Payer: Self-pay | Admitting: Medical Oncology

## 2021-01-20 NOTE — Telephone Encounter (Signed)
Asking for f/u appt after CT scan on /03/28. Schedule message sent. Pt confirmed appt with [facility].

## 2021-01-26 ENCOUNTER — Other Ambulatory Visit: Payer: Self-pay

## 2021-01-26 ENCOUNTER — Ambulatory Visit (HOSPITAL_COMMUNITY)
Admission: RE | Admit: 2021-01-26 | Discharge: 2021-01-26 | Disposition: A | Discharge status: Home / Self Care | Payer: Federal, State, Local not specified - PPO | Source: Ambulatory Visit | Attending: Internal Medicine | Admitting: Internal Medicine

## 2021-01-26 ENCOUNTER — Inpatient Hospital Stay: Payer: Federal, State, Local not specified - PPO | Attending: Internal Medicine

## 2021-01-26 ENCOUNTER — Encounter (HOSPITAL_COMMUNITY): Payer: Self-pay

## 2021-01-26 DIAGNOSIS — Z923 Personal history of irradiation: Secondary | ICD-10-CM | POA: Diagnosis not present

## 2021-01-26 DIAGNOSIS — Z9221 Personal history of antineoplastic chemotherapy: Secondary | ICD-10-CM | POA: Diagnosis not present

## 2021-01-26 DIAGNOSIS — Z902 Acquired absence of lung [part of]: Secondary | ICD-10-CM | POA: Insufficient documentation

## 2021-01-26 DIAGNOSIS — C3411 Malignant neoplasm of upper lobe, right bronchus or lung: Secondary | ICD-10-CM | POA: Insufficient documentation

## 2021-01-26 DIAGNOSIS — C3490 Malignant neoplasm of unspecified part of unspecified bronchus or lung: Secondary | ICD-10-CM

## 2021-01-26 LAB — CBC WITH DIFFERENTIAL (CANCER CENTER ONLY)
Abs Immature Granulocytes: 0.05 10*3/uL (ref 0.00–0.07)
Basophils Absolute: 0.1 10*3/uL (ref 0.0–0.1)
Basophils Relative: 1 %
Eosinophils Absolute: 0.3 10*3/uL (ref 0.0–0.5)
Eosinophils Relative: 3 %
HCT: 46.7 % (ref 39.0–52.0)
Hemoglobin: 14.7 g/dL (ref 13.0–17.0)
Immature Granulocytes: 1 %
Lymphocytes Relative: 17 %
Lymphs Abs: 1.7 10*3/uL (ref 0.7–4.0)
MCH: 27 pg (ref 26.0–34.0)
MCHC: 31.5 g/dL (ref 30.0–36.0)
MCV: 85.7 fL (ref 80.0–100.0)
Monocytes Absolute: 0.8 10*3/uL (ref 0.1–1.0)
Monocytes Relative: 8 %
Neutro Abs: 7.2 10*3/uL (ref 1.7–7.7)
Neutrophils Relative %: 70 %
Platelet Count: 226 10*3/uL (ref 150–400)
RBC: 5.45 MIL/uL (ref 4.22–5.81)
RDW: 13.8 % (ref 11.5–15.5)
WBC Count: 10.2 10*3/uL (ref 4.0–10.5)
nRBC: 0 % (ref 0.0–0.2)

## 2021-01-26 LAB — CMP (CANCER CENTER ONLY)
ALT: 20 U/L (ref 0–44)
AST: 17 U/L (ref 15–41)
Albumin: 3.9 g/dL (ref 3.5–5.0)
Alkaline Phosphatase: 54 U/L (ref 38–126)
Anion gap: 8 (ref 5–15)
BUN: 14 mg/dL (ref 8–23)
CO2: 29 mmol/L (ref 22–32)
Calcium: 8.5 mg/dL — ABNORMAL LOW (ref 8.9–10.3)
Chloride: 105 mmol/L (ref 98–111)
Creatinine: 1.04 mg/dL (ref 0.61–1.24)
GFR, Estimated: >60 mL/min (ref >60)
Glucose, Bld: 94 mg/dL (ref 70–99)
Potassium: 4.7 mmol/L (ref 3.5–5.1)
Sodium: 142 mmol/L (ref 135–145)
Total Bilirubin: 1.4 mg/dL — ABNORMAL HIGH (ref 0.3–1.2)
Total Protein: 6.4 g/dL — ABNORMAL LOW (ref 6.5–8.1)

## 2021-01-26 MED ORDER — IOHEXOL 300 MG/ML  SOLN
75.0000 mL | Freq: Once | INTRAMUSCULAR | Status: AC | PRN
  Administered 2021-01-26: 75 mL via INTRAVENOUS

## 2021-01-28 ENCOUNTER — Encounter: Payer: Self-pay | Admitting: *Deleted

## 2021-01-28 ENCOUNTER — Inpatient Hospital Stay: Payer: Federal, State, Local not specified - PPO | Admitting: Internal Medicine

## 2021-01-28 ENCOUNTER — Telehealth: Payer: Self-pay

## 2021-01-28 ENCOUNTER — Other Ambulatory Visit: Payer: Self-pay

## 2021-01-28 VITALS — BP 116/66 | HR 77 | Temp 97.7°F | Resp 17 | Ht 70.0 in | Wt 189.7 lb

## 2021-01-28 DIAGNOSIS — C3490 Malignant neoplasm of unspecified part of unspecified bronchus or lung: Secondary | ICD-10-CM | POA: Diagnosis not present

## 2021-01-28 DIAGNOSIS — C3411 Malignant neoplasm of upper lobe, right bronchus or lung: Secondary | ICD-10-CM | POA: Diagnosis not present

## 2021-01-28 NOTE — Progress Notes (Signed)
Spoke with Alvin Hardy today.  He is doing well. No barriers identified at this time.

## 2021-01-28 NOTE — Telephone Encounter (Signed)
Pt LM wanting to know Dr. Ellan Lambert opinion regarding his CT scan indicating "Aortic atherosclerosis, in addition to left anterior descending coronary artery disease" as this is the first time this has appeared on his CT scan.  Pt has been advised this has been appearing on his CT scans since 2018 and he is taking Repatha as well.   Pt see's Cardiologist, Dr. Quay Burow and has been advised to contact Dr. Kennon Holter office if he has new concerns or feeling any new symtoms. Pt expressed understanding of this information.

## 2021-01-28 NOTE — Progress Notes (Signed)
[place] [phone]   [phone]  OFFICE PROGRESS NOTE  Crist Infante, MD [place] [address]  DIAGNOSIS AND STAGE: Stage III A (T2a, N2, M0) non-small cell lung cancer, adenocarcinoma with positive EGFR mutation in exon 19 and negative ALK gene translocation diagnosed in April 2014   PRIOR THERAPY:  1) Status post right upper lobectomy with lymph node dissection under the care of Dr. Servando Snare on 02/21/2013. 2) Systemic adjuvant chemotherapy with carboplatin for AUC of 5 and Alimta 500 mg/M2 every 3 weeks. Status post 4 cycles. Last cycle was given on 05/29/2013. (Carboplatin was used in place of cisplatin secondary to concern about intolerability and significant adverse effects of cisplatin).  3) Adjuvant radiotherapy to the mediastinum under the care of Dr. Lisbeth Renshaw completed 08/27/2013.   CURRENT THERAPY: Observation  CHEMOTHERAPY INTENT: adjuvant/curative  CURRENT # OF CHEMOTHERAPY CYCLES: 0 CURRENT ANTIEMETICS: Zofran, Decadron and Compazine  CURRENT SMOKING STATUS: currently a nonsmoker  ORAL CHEMOTHERAPY AND CONSENT: None  CURRENT BISPHOSPHONATES USE: None  PAIN MANAGEMENT: no pain  NARCOTICS INDUCED CONSTIPATION: N/A  LIVING WILL AND CODE STATUS: Full Code.   INTERVAL HISTORY: Alvin Hardy 86 y.o. male returns to the clinic today for her annual follow-up visit.  The patient is feeling fine today with no concerning complaints.  He denied having any current chest pain, shortness of breath, cough or hemoptysis.  He denied having any fever or chills.  He has no nausea, vomiting, diarrhea or constipation.  He has no headache or visual changes.  He had repeat CT scan of the chest performed recently and he is here for evaluation and discussion of his discuss results.  MEDICAL HISTORY: Past Medical History:  Diagnosis Date  . Allergy   . Arthritis    psoriatic arthritis, ? , treated /w mmethotrexate   . Bipolar 1  disorder ([place])   . BPH (benign prostatic hyperplasia)   . Cataract 2010   Bilateral  . Chest pain   . Depression   . Deviated septum    TO THE LEFT  . ED (erectile dysfunction)   . Finger injury    mallet finger-- 02/13/2013, cast in place, followed by dr. Fredna Dow  . GERD (gastroesophageal reflux disease)   . Gilbert's syndrome   . History of radiation therapy 07/24/13-08/27/13   50Gy/25 fx chest  . Hyperlipidemia   . Hypogonadism male   . Laryngopharyngeal reflux   . lung ca dx'd 12/2012   rul  . Lung mass 02/07/13   RIGHT UPPER LOBE  . Morton's neuroma    LEFT FOOT  . Neuromuscular disorder ([place])    peripheral neuropathy  . Shortness of breath    voice breaks, ? related to reflux, although told by Dr. Ardis Hughs, S.- early 51 yr. old    ALLERGIES:  is allergic to codeine, hydrocodone-acetaminophen, omeprazole magnesium, tramadol, decadron [dexamethasone], fenofibrate, neomycin-bacitracin zn-polymyx, other, penicillin g, propoxyphene, statins, ubidecarenone, zetia [ezetimibe], and zoloft [sertraline hcl].  MEDICATIONS:  Current Outpatient Medications  Medication Sig Dispense Refill  . aspirin EC 81 MG tablet Take 81 mg by mouth every morning.     . Calcium Carbonate-Vitamin D (CALTRATE 600+D) 600-400 MG-UNIT per tablet Take 1 tablet by mouth every morning.    . Cholecalciferol (VITAMIN D-3) 1000 UNITS CAPS Take 1,000 Units by mouth every morning.     . clobetasol cream (TEMOVATE) 0.23 % Apply 1 application topically daily. Reported on 01/27/2016    . clonazePAM (  KLONOPIN) 0.5 MG tablet Take 0.5 mg by mouth at bedtime.    . cyanocobalamin 1000 MCG tablet Take 1,000 mcg by mouth every morning.     Marland Kitchen esomeprazole (NEXIUM) 40 MG capsule Take 40 mg by mouth daily before breakfast.    . finasteride (PROSCAR) 5 MG tablet Take 5 mg by mouth daily before breakfast.     . gabapentin (NEURONTIN) 300 MG capsule Take 1,200 mg by mouth at bedtime. He takes two capsules every morning and three  capsules at bedtime.    Marland Kitchen lithium carbonate (LITHOBID) 300 MG CR tablet Take 600 mg by mouth at bedtime.     Marland Kitchen MELATONIN PO Take by mouth.    . Multiple Vitamin (MULTIVITAMIN WITH MINERALS) TABS tablet Take 1 tablet by mouth every morning.    Marland Kitchen OVER THE COUNTER MEDICATION Take 1 tablet by mouth 2 (two) times daily. Glucosamine Chondroitin 1570m/1200mg    . Probiotic Product (PROBIOTIC DAILY PO) Take 1 each by mouth daily.    . propranolol (INDERAL) 10 MG tablet Take 10-20 mg by mouth daily as needed (For extremely stressful circumstances.).     .Marland KitchenREPATHA SURECLICK 1076MG/ML SOAJ Every 15 days    . tamsulosin (FLOMAX) 0.4 MG CAPS capsule Take 0.4 mg by mouth daily.  3  . terbinafine (LAMISIL) 250 MG tablet Take 250 mg by mouth daily.    .Marland Kitchentestosterone cypionate (DEPOTESTOTERONE CYPIONATE) 200 MG/ML injection Inject 0.5 mLs into the muscle every 21 ( twenty-one) days. Now 3023mml  1  . vardenafil (LEVITRA) 5 MG tablet Take 5 mg by mouth daily as needed for erectile dysfunction.      No current facility-administered medications for this visit.    SURGICAL HISTORY:  Past Surgical History:  Procedure Laterality Date  . CYSTOSCOPY N/A 02/21/2013   Procedure: CYSTOSCOPY FLEXIBLE with insertion of foley catheter;  Surgeon: MaFredricka BonineMD;  [facility] Service[facility];  Laterality: N/A;  . EYE SURGERY     cataracts removed fr. both eyes, IOL in place   . KNEE ARTHROSCOPY Right 01/2007  . PILONIDAL CYST EXCISION  1960's  . [place] [facility]  . VIDEO ASSISTED THORACOSCOPY (VATS)/WEDGE RESECTION Right 02/21/2013   Procedure: Right VIDEO ASSISTED THORACOSCOPY ,Thoracotomy with right upper lobectomy, node sampling ;  Surgeon: EdGrace IsaacMD;  [facility] Thor[facility];  Laterality: Right;  . Marland KitchenIDEO BRONCHOSCOPY N/A 02/21/2013   Procedure: VIDEO BRONCHOSCOPY;  Surgeon: EdGrace IsaacMD;  [facility];  Service: Thoracic;  Laterality: N/A;     REVIEW OF SYSTEMS:  A comprehensive review of systems was negative.   PHYSICAL EXAMINATION: General appearance: alert, cooperative and no distress Head: Normocephalic, without obvious abnormality, atraumatic Neck: no adenopathy Lymph nodes: Cervical, supraclavicular, and axillary nodes normal. Resp: clear to auscultation bilaterally Back: symmetric, no curvature. ROM normal. No CVA tenderness. Cardio: regular rate and rhythm, S1, S2 normal, no murmur, click, rub or gallop GI: soft, non-tender; bowel sounds normal; no masses,  no organomegaly Extremities: extremities normal, atraumatic, no cyanosis or edema  ECOG PERFORMANCE STATUS: 0 - Asymptomatic  Blood pressure 116/66, pulse 77, temperature 97.7 F (36.5 C), temperature source Tympanic, resp. rate 17, height '5\' 10"'  (1.778 m), weight 189 lb 11.2 oz (86 kg), SpO2 96 %.  LABORATORY DATA: Lab Results  Component Value Date   WBC 10.2 01/26/2021   HGB 14.7 01/26/2021   HCT 46.7 01/26/2021   MCV 85.7 01/26/2021  PLT 226 01/26/2021      Chemistry      Component Value Date/Time   NA 142 01/26/2021 1124   NA 142 01/17/2017 1125   K 4.7 01/26/2021 1124   K 4.6 01/17/2017 1125   CL 105 01/26/2021 1124   CL 107 04/24/2013 1110   CO2 29 01/26/2021 1124   CO2 26 01/17/2017 1125   BUN 14 01/26/2021 1124   BUN 13.0 01/17/2017 1125   CREATININE 1.04 01/26/2021 1124   CREATININE 1.0 01/17/2017 1125      Component Value Date/Time   CALCIUM 8.5 (L) 01/26/2021 1124   CALCIUM 9.1 01/17/2017 1125   ALKPHOS 54 01/26/2021 1124   ALKPHOS 59 01/17/2017 1125   AST 17 01/26/2021 1124   AST 15 01/17/2017 1125   ALT 20 01/26/2021 1124   ALT 18 01/17/2017 1125   BILITOT 1.4 (H) 01/26/2021 1124   BILITOT 1.57 (H) 01/17/2017 1125       RADIOGRAPHIC STUDIES: CT Chest W Contrast  Result Date: 01/27/2021 CLINICAL DATA:  86 year old male with history of non-small cell lung cancer. Staging examination. EXAM: CT CHEST WITH CONTRAST  TECHNIQUE: Multidetector CT imaging of the chest was performed during intravenous contrast administration. CONTRAST:  29m OMNIPAQUE IOHEXOL 300 MG/ML  SOLN COMPARISON:  Chest CT 01/28/2020. FINDINGS: Cardiovascular: Heart size is normal. There is no significant pericardial fluid, thickening or pericardial calcification. There is aortic atherosclerosis, as well as atherosclerosis of the great vessels of the mediastinum and the coronary arteries, including calcified atherosclerotic plaque in the left anterior descending coronary artery. Mediastinum/Nodes: No pathologically enlarged mediastinal or hilar lymph nodes. Esophagus is unremarkable in appearance. No axillary lymphadenopathy. Lungs/Pleura: Status post right upper lobectomy. Compensatory hyperexpansion of the right middle and lower lobes. Chronic postradiation architectural distortion and volume loss in the medial aspect of the right lung, mass-like in appearance but similar compared to prior examinations, most compatible with chronic postradiation mass-like fibrosis. Small sub solid nodular area of architectural distortion in the superior segment of the right lower lobe near the apex of the right hemithorax (axial image 37 of series 7), currently measuring 1.5 x 1.1 cm, similar to the prior examination. No other new suspicious appearing pulmonary nodules or masses are noted. No acute consolidative airspace disease. No pleural effusions. Upper Abdomen: Exophytic 2.1 cm low-attenuation lesion in the upper pole of the left kidney is compatible with a small simple cyst. Diffuse low attenuation throughout the visualized hepatic parenchyma, indicative of a background of hepatic steatosis. Aortic atherosclerosis. Musculoskeletal: There are no aggressive appearing lytic or blastic lesions noted in the visualized portions of the skeleton. Multiple Schmorl's nodes are noted in the spine. IMPRESSION: 1. Status post right upper lobectomy with stable appearance of chronic  postradiation mass-like fibrosis in the central aspect of the remaining right lung. No definitive findings to suggest locally recurrent disease or definite metastatic disease in the thorax. 2. Sub solid nodular area of architectural distortion in the superior segment of the right lower lobe, stable compared to prior examinations, favored to represent an area of chronic scarring. Close attention on follow-up studies is recommended to ensure continued stability. 3. Aortic atherosclerosis, in addition to left anterior descending coronary artery disease. 4. Hepatic steatosis. Aortic Atherosclerosis (ICD10-I70.0). Electronically Signed   By: DVinnie LangtonM.D.   On: 01/27/2021 07:57   ASSESSMENT AND PLAN: This is a very pleasant 86years old white male with history of stage IIIa non-small cell lung cancer, adenocarcinoma with positive EGFR mutation in  exon 19 status post right upper lobectomy with lymph node dissection followed by 4 cycles of adjuvant systemic chemotherapy followed by adjuvant radiation to the mediastinum. The patient is currently on observation and he is feeling fine today with no concerning complaints. He had repeat CT scan of the chest performed recently.  I personally and independently reviewed the scan and discussed the results with the patient today. His scan showed no concerning findings for disease recurrence or metastasis. I recommended for him to continue on observation with repeat CT scan of the chest in 1 year. He was advised to call immediately if he has any other concerning symptoms in the interval. The patient voices understanding of current disease status and treatment options and is in agreement with the current care plan. All questions were answered. The patient knows to call the clinic with any problems, questions or concerns. We can certainly see the patient much sooner if necessary.   Disclaimer: This note was dictated with voice recognition software. Similar sounding  words can inadvertently be transcribed and may not be corrected upon review.

## 2021-01-30 ENCOUNTER — Telehealth: Payer: Self-pay | Admitting: Internal Medicine

## 2021-01-30 NOTE — Telephone Encounter (Signed)
Scheduled per los. Mailed printout  °

## 2021-02-01 ENCOUNTER — Encounter: Payer: Self-pay | Admitting: Neurology

## 2021-02-01 ENCOUNTER — Encounter: Payer: Self-pay | Admitting: Internal Medicine

## 2021-02-02 ENCOUNTER — Telehealth: Payer: Self-pay

## 2021-02-02 NOTE — Telephone Encounter (Signed)
LVM for pt to give me a call back. Rec'd a message that pt is interested now in scheduling sleep study that Dr. Brett Fairy had ordered back in Nov 2021. Will wait for pt to return my call.

## 2021-02-03 ENCOUNTER — Telehealth: Payer: Self-pay

## 2021-02-03 NOTE — Telephone Encounter (Signed)
LVM for pt to call me back to schedule sleep study  

## 2021-06-28 ENCOUNTER — Encounter (HOSPITAL_COMMUNITY): Payer: Self-pay | Admitting: Family Medicine

## 2021-06-28 ENCOUNTER — Inpatient Hospital Stay (HOSPITAL_COMMUNITY)
Admission: EM | Admit: 2021-06-28 | Discharge: 2021-07-08 | DRG: 871 | Disposition: A | Discharge status: Skilled Nursing Facility | Payer: Medicare Other | Attending: Internal Medicine | Admitting: Internal Medicine

## 2021-06-28 ENCOUNTER — Emergency Department (HOSPITAL_COMMUNITY): Payer: Medicare Other

## 2021-06-28 DIAGNOSIS — Z9079 Acquired absence of other genital organ(s): Secondary | ICD-10-CM | POA: Diagnosis not present

## 2021-06-28 DIAGNOSIS — R140 Abdominal distension (gaseous): Secondary | ICD-10-CM

## 2021-06-28 DIAGNOSIS — Z85118 Personal history of other malignant neoplasm of bronchus and lung: Secondary | ICD-10-CM

## 2021-06-28 DIAGNOSIS — Z20822 Contact with and (suspected) exposure to covid-19: Secondary | ICD-10-CM | POA: Diagnosis present

## 2021-06-28 DIAGNOSIS — I10 Essential (primary) hypertension: Secondary | ICD-10-CM | POA: Diagnosis present

## 2021-06-28 DIAGNOSIS — Z801 Family history of malignant neoplasm of trachea, bronchus and lung: Secondary | ICD-10-CM

## 2021-06-28 DIAGNOSIS — Z79890 Hormone replacement therapy (postmenopausal): Secondary | ICD-10-CM

## 2021-06-28 DIAGNOSIS — Z881 Allergy status to other antibiotic agents status: Secondary | ICD-10-CM

## 2021-06-28 DIAGNOSIS — F319 Bipolar disorder, unspecified: Secondary | ICD-10-CM | POA: Diagnosis present

## 2021-06-28 DIAGNOSIS — Z888 Allergy status to other drugs, medicaments and biological substances status: Secondary | ICD-10-CM

## 2021-06-28 DIAGNOSIS — Z880 Allergy status to penicillin: Secondary | ICD-10-CM

## 2021-06-28 DIAGNOSIS — Z4659 Encounter for fitting and adjustment of other gastrointestinal appliance and device: Secondary | ICD-10-CM

## 2021-06-28 DIAGNOSIS — J9601 Acute respiratory failure with hypoxia: Secondary | ICD-10-CM

## 2021-06-28 DIAGNOSIS — L4050 Arthropathic psoriasis, unspecified: Secondary | ICD-10-CM | POA: Diagnosis present

## 2021-06-28 DIAGNOSIS — Z87891 Personal history of nicotine dependence: Secondary | ICD-10-CM

## 2021-06-28 DIAGNOSIS — J690 Pneumonitis due to inhalation of food and vomit: Secondary | ICD-10-CM | POA: Diagnosis not present

## 2021-06-28 DIAGNOSIS — K219 Gastro-esophageal reflux disease without esophagitis: Secondary | ICD-10-CM | POA: Diagnosis present

## 2021-06-28 DIAGNOSIS — R339 Retention of urine, unspecified: Secondary | ICD-10-CM

## 2021-06-28 DIAGNOSIS — J189 Pneumonia, unspecified organism: Secondary | ICD-10-CM

## 2021-06-28 DIAGNOSIS — Z7982 Long term (current) use of aspirin: Secondary | ICD-10-CM

## 2021-06-28 DIAGNOSIS — Z923 Personal history of irradiation: Secondary | ICD-10-CM

## 2021-06-28 DIAGNOSIS — G629 Polyneuropathy, unspecified: Secondary | ICD-10-CM | POA: Diagnosis present

## 2021-06-28 DIAGNOSIS — E785 Hyperlipidemia, unspecified: Secondary | ICD-10-CM | POA: Diagnosis present

## 2021-06-28 DIAGNOSIS — J188 Other pneumonia, unspecified organism: Secondary | ICD-10-CM | POA: Diagnosis present

## 2021-06-28 DIAGNOSIS — R001 Bradycardia, unspecified: Secondary | ICD-10-CM | POA: Diagnosis present

## 2021-06-28 DIAGNOSIS — R4182 Altered mental status, unspecified: Secondary | ICD-10-CM | POA: Diagnosis not present

## 2021-06-28 DIAGNOSIS — Z9221 Personal history of antineoplastic chemotherapy: Secondary | ICD-10-CM

## 2021-06-28 DIAGNOSIS — R9431 Abnormal electrocardiogram [ECG] [EKG]: Secondary | ICD-10-CM

## 2021-06-28 DIAGNOSIS — Z8249 Family history of ischemic heart disease and other diseases of the circulatory system: Secondary | ICD-10-CM

## 2021-06-28 DIAGNOSIS — G9341 Metabolic encephalopathy: Secondary | ICD-10-CM | POA: Diagnosis not present

## 2021-06-28 DIAGNOSIS — E291 Testicular hypofunction: Secondary | ICD-10-CM | POA: Diagnosis present

## 2021-06-28 DIAGNOSIS — E876 Hypokalemia: Secondary | ICD-10-CM | POA: Diagnosis present

## 2021-06-28 DIAGNOSIS — Z885 Allergy status to narcotic agent status: Secondary | ICD-10-CM

## 2021-06-28 DIAGNOSIS — N401 Enlarged prostate with lower urinary tract symptoms: Secondary | ICD-10-CM | POA: Diagnosis present

## 2021-06-28 DIAGNOSIS — A419 Sepsis, unspecified organism: Secondary | ICD-10-CM

## 2021-06-28 DIAGNOSIS — R0600 Dyspnea, unspecified: Secondary | ICD-10-CM | POA: Diagnosis present

## 2021-06-28 DIAGNOSIS — J9382 Other air leak: Secondary | ICD-10-CM | POA: Diagnosis not present

## 2021-06-28 DIAGNOSIS — J440 Chronic obstructive pulmonary disease with acute lower respiratory infection: Secondary | ICD-10-CM | POA: Diagnosis present

## 2021-06-28 DIAGNOSIS — R29810 Facial weakness: Secondary | ICD-10-CM | POA: Diagnosis not present

## 2021-06-28 DIAGNOSIS — J441 Chronic obstructive pulmonary disease with (acute) exacerbation: Secondary | ICD-10-CM | POA: Diagnosis present

## 2021-06-28 DIAGNOSIS — R6520 Severe sepsis without septic shock: Secondary | ICD-10-CM | POA: Diagnosis present

## 2021-06-28 DIAGNOSIS — R1310 Dysphagia, unspecified: Secondary | ICD-10-CM | POA: Diagnosis not present

## 2021-06-28 DIAGNOSIS — R0603 Acute respiratory distress: Secondary | ICD-10-CM | POA: Diagnosis not present

## 2021-06-28 DIAGNOSIS — Z79899 Other long term (current) drug therapy: Secondary | ICD-10-CM

## 2021-06-28 DIAGNOSIS — R4701 Aphasia: Secondary | ICD-10-CM | POA: Diagnosis not present

## 2021-06-28 DIAGNOSIS — Z01818 Encounter for other preprocedural examination: Secondary | ICD-10-CM

## 2021-06-28 DIAGNOSIS — Z902 Acquired absence of lung [part of]: Secondary | ICD-10-CM

## 2021-06-28 HISTORY — DX: Essential (primary) hypertension: I10

## 2021-06-28 LAB — CBC WITH DIFFERENTIAL/PLATELET
Abs Immature Granulocytes: 0.14 10*3/uL — ABNORMAL HIGH (ref 0.00–0.07)
Basophils Absolute: 0 10*3/uL (ref 0.0–0.1)
Basophils Relative: 0 %
Eosinophils Absolute: 0 10*3/uL (ref 0.0–0.5)
Eosinophils Relative: 0 %
HCT: 45.4 % (ref 39.0–52.0)
Hemoglobin: 14.5 g/dL (ref 13.0–17.0)
Immature Granulocytes: 1 %
Lymphocytes Relative: 7 %
Lymphs Abs: 1.1 10*3/uL (ref 0.7–4.0)
MCH: 28.6 pg (ref 26.0–34.0)
MCHC: 31.9 g/dL (ref 30.0–36.0)
MCV: 89.5 fL (ref 80.0–100.0)
Monocytes Absolute: 1.6 10*3/uL — ABNORMAL HIGH (ref 0.1–1.0)
Monocytes Relative: 10 %
Neutro Abs: 12.3 10*3/uL — ABNORMAL HIGH (ref 1.7–7.7)
Neutrophils Relative %: 82 %
Platelets: 207 10*3/uL (ref 150–400)
RBC: 5.07 MIL/uL (ref 4.22–5.81)
RDW: 14.1 % (ref 11.5–15.5)
WBC: 15.1 10*3/uL — ABNORMAL HIGH (ref 4.0–10.5)
nRBC: 0 % (ref 0.0–0.2)

## 2021-06-28 LAB — LACTIC ACID, PLASMA
Lactic Acid, Venous: 2.2 mmol/L (ref 0.5–1.9)
Lactic Acid, Venous: 4.7 mmol/L (ref 0.5–1.9)
Lactic Acid, Venous: 4.8 mmol/L (ref 0.5–1.9)
Lactic Acid, Venous: 2.6 mmol/L (ref 0.5–1.9)

## 2021-06-28 LAB — URINALYSIS, ROUTINE W REFLEX MICROSCOPIC
Bacteria, UA: NONE SEEN
Bilirubin Urine: NEGATIVE
Glucose, UA: NEGATIVE mg/dL
Ketones, ur: NEGATIVE mg/dL
Leukocytes,Ua: NEGATIVE
Nitrite: NEGATIVE
Protein, ur: NEGATIVE mg/dL
Specific Gravity, Urine: 1.015 (ref 1.005–1.030)
pH: 6 (ref 5.0–8.0)

## 2021-06-28 LAB — BLOOD GAS, ARTERIAL
Acid-base deficit: 3.2 mmol/L — ABNORMAL HIGH (ref 0.0–2.0)
Bicarbonate: 21 mmol/L (ref 20.0–28.0)
O2 Saturation: 98.9 %
Patient temperature: 98.6
pCO2 arterial: 36.9 mmHg (ref 32.0–48.0)
pH, Arterial: 7.373 (ref 7.350–7.450)
pO2, Arterial: 137 mmHg — ABNORMAL HIGH (ref 83.0–108.0)

## 2021-06-28 LAB — RESP PANEL BY RT-PCR (FLU A&B, COVID) ARPGX2
Influenza A by PCR: NEGATIVE
Influenza B by PCR: NEGATIVE
SARS Coronavirus 2 by RT PCR: NEGATIVE

## 2021-06-28 LAB — PROTIME-INR
INR: 1.1 (ref 0.8–1.2)
Prothrombin Time: 14.1 seconds (ref 11.4–15.2)

## 2021-06-28 LAB — COMPREHENSIVE METABOLIC PANEL
ALT: 20 U/L (ref 0–44)
AST: 28 U/L (ref 15–41)
Albumin: 3.9 g/dL (ref 3.5–5.0)
Alkaline Phosphatase: 47 U/L (ref 38–126)
Anion gap: 11 (ref 5–15)
BUN: 21 mg/dL (ref 8–23)
CO2: 22 mmol/L (ref 22–32)
Calcium: 9.1 mg/dL (ref 8.9–10.3)
Chloride: 103 mmol/L (ref 98–111)
Creatinine, Ser: 0.92 mg/dL (ref 0.61–1.24)
GFR, Estimated: >60 mL/min (ref >60)
Glucose, Bld: 173 mg/dL — ABNORMAL HIGH (ref 70–99)
Potassium: 3.6 mmol/L (ref 3.5–5.1)
Sodium: 136 mmol/L (ref 135–145)
Total Bilirubin: 1.1 mg/dL (ref 0.3–1.2)
Total Protein: 6.8 g/dL (ref 6.5–8.1)

## 2021-06-28 LAB — APTT: aPTT: 24 seconds (ref 24–36)

## 2021-06-28 LAB — BRAIN NATRIURETIC PEPTIDE: B Natriuretic Peptide: 110 pg/mL — ABNORMAL HIGH (ref 0.0–100.0)

## 2021-06-28 LAB — MRSA NEXT GEN BY PCR, NASAL: MRSA by PCR Next Gen: NOT DETECTED

## 2021-06-28 MED ORDER — METHYLPREDNISOLONE SODIUM SUCC 40 MG IJ SOLR
40.0000 mg | Freq: Every day | INTRAMUSCULAR | Status: DC
  Administered 2021-06-28 – 2021-07-01 (×4): 40 mg via INTRAVENOUS
  Filled 2021-06-28 (×4): qty 1

## 2021-06-28 MED ORDER — SODIUM CHLORIDE 0.9 % IV SOLN
2.0000 g | Freq: Once | INTRAVENOUS | Status: AC
  Administered 2021-06-28: 2 g via INTRAVENOUS
  Filled 2021-06-28: qty 2

## 2021-06-28 MED ORDER — LEVALBUTEROL HCL 1.25 MG/0.5ML IN NEBU
1.2500 mg | INHALATION_SOLUTION | RESPIRATORY_TRACT | Status: AC | PRN
Start: 2021-06-28 — End: 2021-06-28
  Administered 2021-06-28 (×2): 1.25 mg via RESPIRATORY_TRACT
  Filled 2021-06-28 (×2): qty 0.5

## 2021-06-28 MED ORDER — CLONAZEPAM 0.5 MG PO TABS
0.5000 mg | ORAL_TABLET | Freq: Every day | ORAL | Status: DC
  Administered 2021-06-28 – 2021-06-29 (×2): 0.5 mg via ORAL
  Filled 2021-06-28 (×2): qty 1

## 2021-06-28 MED ORDER — LACTATED RINGERS IV SOLN: INTRAVENOUS | Status: DC

## 2021-06-28 MED ORDER — LITHIUM CARBONATE ER 300 MG PO TBCR
600.0000 mg | EXTENDED_RELEASE_TABLET | Freq: Every day | ORAL | Status: DC
  Administered 2021-06-28 – 2021-06-29 (×2): 600 mg via ORAL
  Filled 2021-06-28 (×2): qty 2

## 2021-06-28 MED ORDER — ORAL CARE MOUTH RINSE
15.0000 mL | Freq: Two times a day (BID) | OROMUCOSAL | Status: DC
  Administered 2021-06-28 – 2021-06-29 (×2): 15 mL via OROMUCOSAL

## 2021-06-28 MED ORDER — IPRATROPIUM-ALBUTEROL 0.5-2.5 (3) MG/3ML IN SOLN
3.0000 mL | Freq: Four times a day (QID) | RESPIRATORY_TRACT | Status: DC
  Administered 2021-06-29 – 2021-07-05 (×26): 3 mL via RESPIRATORY_TRACT
  Filled 2021-06-28 (×26): qty 3

## 2021-06-28 MED ORDER — ACETAMINOPHEN 650 MG RE SUPP: 650.0000 mg | Freq: Four times a day (QID) | RECTAL | Status: DC | PRN

## 2021-06-28 MED ORDER — PANTOPRAZOLE SODIUM 40 MG PO TBEC: 40.0000 mg | DELAYED_RELEASE_TABLET | Freq: Every day | ORAL | Status: DC

## 2021-06-28 MED ORDER — LORAZEPAM 2 MG/ML IJ SOLN: 1.0000 mg | INTRAMUSCULAR | Status: DC | PRN

## 2021-06-28 MED ORDER — ENOXAPARIN SODIUM 40 MG/0.4ML IJ SOSY
40.0000 mg | PREFILLED_SYRINGE | Freq: Every day | INTRAMUSCULAR | Status: DC
  Administered 2021-06-28 – 2021-07-08 (×11): 40 mg via SUBCUTANEOUS
  Filled 2021-06-28 (×11): qty 0.4

## 2021-06-28 MED ORDER — LOSARTAN POTASSIUM 50 MG PO TABS
50.0000 mg | ORAL_TABLET | Freq: Every day | ORAL | Status: DC
  Administered 2021-06-29 (×2): 50 mg via ORAL
  Filled 2021-06-28: qty 2
  Filled 2021-06-28 (×2): qty 1

## 2021-06-28 MED ORDER — TAMSULOSIN HCL 0.4 MG PO CAPS
0.4000 mg | ORAL_CAPSULE | Freq: Every day | ORAL | Status: DC
  Administered 2021-06-28 – 2021-06-29 (×2): 0.4 mg via ORAL
  Filled 2021-06-28 (×2): qty 1

## 2021-06-28 MED ORDER — GABAPENTIN 300 MG PO CAPS
900.0000 mg | ORAL_CAPSULE | Freq: Every day | ORAL | Status: DC
  Administered 2021-06-28 – 2021-06-29 (×2): 900 mg via ORAL
  Filled 2021-06-28 (×2): qty 3

## 2021-06-28 MED ORDER — ASPIRIN EC 81 MG PO TBEC
81.0000 mg | DELAYED_RELEASE_TABLET | Freq: Every morning | ORAL | Status: DC
  Administered 2021-06-28 – 2021-06-29 (×2): 81 mg via ORAL
  Filled 2021-06-28 (×2): qty 1

## 2021-06-28 MED ORDER — MAGNESIUM SULFATE 2 GM/50ML IV SOLN
2.0000 g | Freq: Once | INTRAVENOUS | Status: AC
  Administered 2021-06-28: 2 g via INTRAVENOUS
  Filled 2021-06-28: qty 50

## 2021-06-28 MED ORDER — ACETAMINOPHEN 325 MG PO TABS
650.0000 mg | ORAL_TABLET | Freq: Four times a day (QID) | ORAL | Status: DC | PRN
  Administered 2021-06-28: 650 mg via ORAL
  Filled 2021-06-28: qty 2

## 2021-06-28 MED ORDER — GABAPENTIN 300 MG PO CAPS: 1200.0000 mg | ORAL_CAPSULE | Freq: Every day | ORAL | Status: DC

## 2021-06-28 MED ORDER — METHYLPREDNISOLONE SODIUM SUCC 125 MG IJ SOLR
125.0000 mg | Freq: Once | INTRAMUSCULAR | Status: AC
  Administered 2021-06-28: 125 mg via INTRAVENOUS
  Filled 2021-06-28: qty 2

## 2021-06-28 MED ORDER — IPRATROPIUM-ALBUTEROL 0.5-2.5 (3) MG/3ML IN SOLN
3.0000 mL | Freq: Once | RESPIRATORY_TRACT | Status: AC
  Administered 2021-06-28: 3 mL via RESPIRATORY_TRACT
  Filled 2021-06-28: qty 3

## 2021-06-28 MED ORDER — LACTATED RINGERS IV BOLUS (SEPSIS)
2000.0000 mL | Freq: Once | INTRAVENOUS | Status: AC
  Administered 2021-06-28: 2000 mL via INTRAVENOUS

## 2021-06-28 MED ORDER — LORAZEPAM 1 MG PO TABS
1.0000 mg | ORAL_TABLET | ORAL | Status: DC | PRN
  Administered 2021-06-28 – 2021-06-29 (×4): 1 mg via ORAL
  Filled 2021-06-28 (×4): qty 1

## 2021-06-28 MED ORDER — SODIUM CHLORIDE 0.9 % IV SOLN
1.0000 g | Freq: Three times a day (TID) | INTRAVENOUS | Status: DC
  Filled 2021-06-28: qty 1

## 2021-06-28 MED ORDER — VANCOMYCIN HCL 1750 MG/350ML IV SOLN
1750.0000 mg | INTRAVENOUS | Status: DC
  Filled 2021-06-28: qty 350

## 2021-06-28 MED ORDER — LACTATED RINGERS IV BOLUS (SEPSIS)
1000.0000 mL | Freq: Once | INTRAVENOUS | Status: AC
  Administered 2021-06-28: 1000 mL via INTRAVENOUS

## 2021-06-28 MED ORDER — IPRATROPIUM-ALBUTEROL 0.5-2.5 (3) MG/3ML IN SOLN
3.0000 mL | Freq: Four times a day (QID) | RESPIRATORY_TRACT | Status: DC | PRN
  Administered 2021-06-28 (×2): 3 mL via RESPIRATORY_TRACT
  Filled 2021-06-28 (×2): qty 3

## 2021-06-28 MED ORDER — IOHEXOL 350 MG/ML SOLN
80.0000 mL | Freq: Once | INTRAVENOUS | Status: AC | PRN
  Administered 2021-06-28: 80 mL via INTRAVENOUS

## 2021-06-28 MED ORDER — SENNOSIDES-DOCUSATE SODIUM 8.6-50 MG PO TABS: 1.0000 | ORAL_TABLET | Freq: Every evening | ORAL | Status: DC | PRN

## 2021-06-28 MED ORDER — ALBUTEROL SULFATE (2.5 MG/3ML) 0.083% IN NEBU
2.5000 mg | INHALATION_SOLUTION | RESPIRATORY_TRACT | Status: DC | PRN
  Administered 2021-06-28 – 2021-06-29 (×3): 2.5 mg via RESPIRATORY_TRACT
  Filled 2021-06-28 (×3): qty 3

## 2021-06-28 MED ORDER — GABAPENTIN 300 MG PO CAPS
600.0000 mg | ORAL_CAPSULE | Freq: Every day | ORAL | Status: DC
  Administered 2021-06-29: 600 mg via ORAL
  Filled 2021-06-28: qty 2

## 2021-06-28 MED ORDER — CHLORHEXIDINE GLUCONATE CLOTH 2 % EX PADS
6.0000 | MEDICATED_PAD | Freq: Every day | CUTANEOUS | Status: DC
  Administered 2021-06-28 – 2021-07-08 (×11): 6 via TOPICAL

## 2021-06-28 MED ORDER — SODIUM CHLORIDE 0.9 % IV SOLN
2.0000 g | Freq: Three times a day (TID) | INTRAVENOUS | Status: DC
  Administered 2021-06-28 – 2021-07-01 (×10): 2 g via INTRAVENOUS
  Filled 2021-06-28 (×10): qty 2

## 2021-06-28 MED ORDER — FINASTERIDE 5 MG PO TABS
5.0000 mg | ORAL_TABLET | Freq: Every day | ORAL | Status: DC
  Administered 2021-06-28 – 2021-06-29 (×2): 5 mg via ORAL
  Filled 2021-06-28 (×2): qty 1

## 2021-06-28 MED ORDER — VANCOMYCIN HCL IN DEXTROSE 1-5 GM/200ML-% IV SOLN
1000.0000 mg | Freq: Once | INTRAVENOUS | Status: AC
  Administered 2021-06-28: 1000 mg via INTRAVENOUS
  Filled 2021-06-28: qty 200

## 2021-06-28 NOTE — ED Notes (Signed)
Pt given meal tray.

## 2021-06-28 NOTE — H&P (Signed)
History and Physical    Alvin Hardy UUV:[id number] DOB: [birth year] DOA: 06/28/2021  PCP: Crist Infante, MD   Patient coming from: Home  Chief Complaint: Shortness of breath, fever  HPI: Alvin Hardy is a 86 y.o. male with medical history significant for lung cancer status post lobectomy in remission, GERD, Gilbert's syndrome who presents to the emergency department by EMS from the independent living section of [facility] with SOB and fever.  Patient reports that he has been short of breath all week and is progressively gotten worse.  He is not sure what set off his shortness of breath.  States he was seen by his PCP a few days ago and was started on doxycycline did not improve and went back to his doctor and then was placed on a Z-Pak and prednisone.  He was given dose of Decadron in the office and given in albuterol inhaler and cough medication.  Despite these interventions he continued to get worse and has increased shortness of breath.  He has difficulty speaking in full sentences due to the shortness of breath which is not normal for him.  He reports he has been coughing but has not gotten any phlegm or production up.  He also has noticed some wheezing over the last few days.  He states that he had intermittent chills over the last few days but did not know he had a fever until EMS took his temperature.  He reports he has had some nasal congestion but has not had any chest pain, abdominal pain, nausea vomiting diarrhea, urinary symptoms.  He has not had any syncope.  He has had a decreased appetite the past few days.  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  Concerned that he cannot be around his wife if he has pneumonia as tolerate pneumonia with her condition he states. He has a history of smoking but he quit over 40 years ago.  Denies alcohol or illicit drug use.  ED Course: Alvin Hardy is a T-max of 102.6 degrees in the emergency room.  He  has had tachycardia and tachypnea in the emergency room and is requiring oxygen by nasal cannula to maintain O2 sats above 93%.  He has not been hypotensive.  He is found to have multifocal pneumonia on CT of his chest.  Lab work shows a WBC of 15,100 hemoglobin 14.5 hematocrit 45.4 platelets 207,000, BNP 110, initial lactic acid was 2.2. but incredased to 4.7 a few hours later.  Sodium 136 potassium 3.6 chloride 103 bicarb 22 creatinine 0.92 BUN 21 alkaline phosphatase 47 AST 28 ALT 20 glucose 173, INR 1.1.  COVID-19 negative.  Influenza A and B are negative.  Started on empiric antibiotics with vancomycin and cefepime.  Patient placed on sepsis protocol.  Hospitalist service was asked to admit for further management.  I was just notified by the nurse that patient is now becoming confused and still seems to be having difficulty breathing but is satting in the mid 90s on nasal cannula.  I have ordered an ABG to evaluate further  Review of Systems:  General: Reports fatigue, fever, chills. Denies weight loss, night sweats.  Denies dizziness.  HENT: Denies head trauma, headache, denies change in hearing, tinnitus. Denies nasal congestion. Denies sore throat.  Denies difficulty swallowing Eyes: Denies blurry vision, pain in eye, drainage.  Denies discoloration of eyes. Neck: Denies pain.  Denies swelling.  Denies pain with movement. Cardiovascular:  Denies chest pain, palpitations.  Denies edema.  Denies orthopnea Respiratory: Reports shortness of breath, cough. Reports wheezing.  Denies sputum production Gastrointestinal: Denies abdominal pain, swelling.  Denies nausea, vomiting, diarrhea.  Denies melena.  Denies hematemesis. Musculoskeletal: Denies limitation of movement.  Denies deformity or swelling.  Denies pain.  Denies arthralgias or myalgias. Genitourinary: Denies pelvic pain.  Denies urinary frequency or hesitancy.  Denies dysuria.  Skin: Denies rash.  Denies petechiae, purpura,  ecchymosis. Neurological: Denies syncope.  Denies seizure activity.  Denies  paresthesia. Denies slurred speech, drooping face.  Denies visual change. Psychiatric: Denies depression, anxiety. Denies hallucinations.  Past Medical History:  Diagnosis Date   Allergy    Arthritis    psoriatic arthritis, ? , treated /w mmethotrexate    Bipolar 1 disorder ([place])    BPH (benign prostatic hyperplasia)    Cataract 2010   Bilateral   Chest pain    Depression    Deviated septum    TO THE LEFT   ED (erectile dysfunction)    Essential hypertension 06/28/2021   Finger injury    mallet finger-- 02/13/2013, cast in place, followed by dr. Fredna Dow   GERD (gastroesophageal reflux disease)    Gilbert's syndrome    History of radiation therapy 07/24/13-08/27/13   50Gy/25 fx chest   Hyperlipidemia    Hypogonadism male    Laryngopharyngeal reflux    lung ca dx'd 12/2012   rul   Lung mass 02/07/13   RIGHT UPPER LOBE   Morton's neuroma    LEFT FOOT   Neuromuscular disorder ([place])    peripheral neuropathy   Shortness of breath    voice breaks, ? related to reflux, although told by Dr. Ardis Hughs, S.- early 33 yr. old    Past Surgical History:  Procedure Laterality Date   CYSTOSCOPY N/A 02/21/2013   Procedure: CYSTOSCOPY FLEXIBLE with insertion of foley catheter;  Surgeon: Fredricka Bonine, MD;  Location: [facility];  Service: Urology;  Laterality: N/A;   EYE SURGERY     cataracts removed fr. both eyes, IOL in place    KNEE ARTHROSCOPY Right 01/2007   PILONIDAL CYST EXCISION  1960's   PROSTATE SURGERY  1996   TURP   VIDEO ASSISTED THORACOSCOPY (VATS)/WEDGE RESECTION Right 02/21/2013   Procedure: Right VIDEO ASSISTED THORACOSCOPY ,Thoracotomy with right upper lobectomy, node sampling ;  Surgeon: Grace Isaac, MD;  Location: [place];  Service: Thoracic;  Laterality: Right;   VIDEO BRONCHOSCOPY N/A 02/21/2013   Procedure: VIDEO BRONCHOSCOPY;  Surgeon: Grace Isaac, MD;  Location: [facility];  Service:  Thoracic;  Laterality: N/A;    Social History  reports that he quit smoking about 42 years ago. His smoking use included cigarettes. He has never used smokeless tobacco. He reports current alcohol use. He reports that he does not use drugs.  Allergies  Allergen Reactions   Codeine Anaphylaxis   Hydrocodone-Acetaminophen     Other reaction(s): Other (See Comments) Reactions unknown   Omeprazole Magnesium     Other reaction(s): Other (See Comments) Reaction Unknown   Tramadol     Other reaction(s): Other (See Comments) Reactions Unknown   Decadron [Dexamethasone]     Pt says it is contraindication due to the lithium he is taking   Fenofibrate     Other reaction(s): Other (See Comments) Reactions Unknown   Neomycin-Bacitracin Zn-Polymyx     Other reaction(s): Other (See Comments) Reactions Unknown   Other     Pt. Remarks that all pain med. Make  him nauseated    Penicillin G     Other reaction(s): Other (See Comments) Reactions Unknown   Propoxyphene     Other reaction(s): Other (See Comments) Reaction Unknown   Statins     Lipitor, Crestor, Zocor all caused muscle aches   Ubidecarenone     Other reaction(s): Other (See Comments) Reactions Unknown   Zetia [Ezetimibe]     Muscle aches   Zoloft [Sertraline Hcl] Other (See Comments)    REACTION:  unknown    Family History  Problem Relation Age of Onset   Diabetes Mother    Cancer Mother        abdomen   Heart disease Father    Diabetes Son    Cancer Maternal Aunt        lung ca, smoker     Prior to Admission medications   Medication Sig Start Date End Date Taking? Authorizing Provider  aspirin EC 81 MG tablet Take 81 mg by mouth every morning.     [provider]  Calcium Carbonate-Vitamin D 600-400 MG-UNIT tablet Take 1 tablet by mouth every morning.    [provider]  Cholecalciferol (VITAMIN D-3) 1000 UNITS CAPS Take 1,000 Units by mouth every morning.     [provider]   clobetasol cream (TEMOVATE) 0.86 % Apply 1 application topically daily. Reported on 01/27/2016    [provider]  clonazePAM (KLONOPIN) 0.5 MG tablet Take 0.5 mg by mouth at bedtime.    [provider]  cyanocobalamin 1000 MCG tablet Take 1,000 mcg by mouth every morning.     [provider]  esomeprazole (NEXIUM) 40 MG capsule Take 40 mg by mouth daily before breakfast.    [provider]  finasteride (PROSCAR) 5 MG tablet Take 5 mg by mouth daily before breakfast.     [provider]  gabapentin (NEURONTIN) 300 MG capsule Take 1,200 mg by mouth at bedtime. He takes two capsules every morning and three capsules at bedtime.    [provider]  lithium carbonate (LITHOBID) 300 MG CR tablet Take 600 mg by mouth at bedtime.     [provider]  MELATONIN PO Take by mouth.    [provider]  Multiple Vitamin (MULTIVITAMIN WITH MINERALS) TABS tablet Take 1 tablet by mouth every morning.    [provider]  OVER THE COUNTER MEDICATION Take 1 tablet by mouth 2 (two) times daily. Glucosamine Chondroitin 1500mg /1200mg     [provider]  Probiotic Product (PROBIOTIC DAILY PO) Take 1 each by mouth daily.    [provider]  propranolol (INDERAL) 10 MG tablet Take 10-20 mg by mouth daily as needed (For extremely stressful circumstances.).     [provider]  REPATHA SURECLICK 761 MG/ML SOAJ Every 15 days 05/28/16   [provider]  tamsulosin (FLOMAX) 0.4 MG CAPS capsule Take 0.4 mg by mouth daily. 12/19/14   [provider]  terbinafine (LAMISIL) 250 MG tablet Take 250 mg by mouth daily. 01/25/20   [provider]  testosterone cypionate (DEPOTESTOTERONE CYPIONATE) 200 MG/ML injection Inject 0.5 mLs into the muscle every 21 ( twenty-one) days. Now 300mg /ml 11/05/14   [provider]  vardenafil (LEVITRA) 5 MG tablet Take 5 mg by mouth daily as needed for erectile  dysfunction.     [provider]    Physical Exam: Vitals:   06/28/21 0415 06/28/21 0445 06/28/21 0459 06/28/21 0545  BP: 124/63 128/65  129/64  Pulse: (!) 118 Marland Kitchen)  118 (!) 123 (!) 114  Resp: (!) 29 (!) 33 (!) 27 (!) 25  Temp:   99.8 F (37.7 C)   TempSrc:   Rectal   SpO2: 96% 96% 95% 96%  Height:        Constitutional: Elderly male with tachypnea and difficulty speaking more than a few words at a time due to SOB Vitals:   06/28/21 0415 06/28/21 0445 06/28/21 0459 06/28/21 0545  BP: 124/63 128/65  129/64  Pulse: (!) 118 (!) 118 (!) 123 (!) 114  Resp: (!) 29 (!) 33 (!) 27 (!) 25  Temp:   99.8 F (37.7 C)   TempSrc:   Rectal   SpO2: 96% 96% 95% 96%  Height:       General: WDWN, Alert and oriented x3.  Eyes: EOMI, PERRL, conjunctivae normal.  Sclera nonicteric HENT:  /AT, external ears normal. Nares patent without epistasis. Mucous membranes are moist. Posterior pharynx clear  Neck: Soft, normal range of motion, supple, no masses, no thyromegaly.  Trachea midline Respiratory: Diminished breath sounds with diffuse Rales and rhonchi.  Has expiratory wheezing, no crackles.  Has accessory muscle use.  Cardiovascular: Regular rhythm with tachycardia, no murmurs / rubs / gallops. No extremity edema. 1+ pedal pulses. Abdomen: Soft, no tenderness, nondistended, no rebound or guarding.  No masses palpated. Bowel sounds normoactive Musculoskeletal: FROM. Has clubbing of digits. No cyanosis. No joint deformity upper and lower extremities. Normal muscle tone.  Skin: Warm, dry, intact no rashes, lesions, ulcers. No induration Neurologic: CN 2-12 grossly intact.  Normal speech.  Sensation intact to touch. Strength 5/5 in all extremities.   Psychiatric: Normal judgment and insight.  Normal mood.    Labs on Admission: I have personally reviewed following labs and imaging studies  CBC: Recent Labs  Lab 06/28/21 0100  WBC 15.1*  NEUTROABS 12.3*  HGB 14.5  HCT 45.4  MCV 89.5   PLT 786    Basic Metabolic Panel: Recent Labs  Lab 06/28/21 0100  NA 136  K 3.6  CL 103  CO2 22  GLUCOSE 173*  BUN 21  CREATININE 0.92  CALCIUM 9.1    GFR: CrCl cannot be calculated (Unknown ideal weight.).  Liver Function Tests: Recent Labs  Lab 06/28/21 0100  AST 28  ALT 20  ALKPHOS 47  BILITOT 1.1  PROT 6.8  ALBUMIN 3.9    Urine analysis:    Component Value Date/Time   COLORURINE STRAW (A) 06/28/2021 0450   APPEARANCEUR CLEAR 06/28/2021 0450   LABSPEC 1.015 06/28/2021 0450   PHURINE 6.0 06/28/2021 0450   GLUCOSEU NEGATIVE 06/28/2021 0450   HGBUR MODERATE (A) 06/28/2021 0450   BILIRUBINUR NEGATIVE 06/28/2021 0450   KETONESUR NEGATIVE 06/28/2021 0450   PROTEINUR NEGATIVE 06/28/2021 0450   UROBILINOGEN 0.2 09/07/2013 1437   NITRITE NEGATIVE 06/28/2021 0450   LEUKOCYTESUR NEGATIVE 06/28/2021 0450    Radiological Exams on Admission: CT Angio Chest PE W and/or Wo Contrast  Result Date: 06/28/2021 CLINICAL DATA:  86 year old male recently diagnosed with bronchitis. Fever of 102.1. Shortness of breath. History of lung cancer status post right upper lobectomy and radiation. EXAM: CT ANGIOGRAPHY CHEST WITH CONTRAST TECHNIQUE: Multidetector CT imaging of the chest was performed using the standard protocol during bolus administration of intravenous contrast. Multiplanar CT image reconstructions and MIPs were obtained to evaluate the vascular anatomy. CONTRAST:  68mL OMNIPAQUE IOHEXOL 350 MG/ML SOLN COMPARISON:  Portable chest 0149 hours today. Chest CT 01/26/2021 and earlier. FINDINGS: Cardiovascular: Good contrast bolus timing in  the pulmonary arterial tree. Intermittent respiratory motion most pronounced at the lung apices and the lung bases. No central or hilar pulmonary artery filling defect. No segmental branch filling defect identified. Stable aorta with atherosclerosis. Heart size remains normal. No pericardial effusion. Mediastinum/Nodes: Stable mediastinum and  hila with post treatment changes on the right. No lymphadenopathy. Lungs/Pleura: Status post right upper lobectomy with postradiation fibrosis about the right hilum. Major airways are stable. Mild respiratory motion. Stable right apical ground-glass opacity measuring 15 mm. But there is new multifocal peribronchial and peripheral/subpleural pulmonary opacity in both lower lobes greater on the left (series 6, image 81) which appears infectious/inflammatory. Multiple lower lobe segments affected bilaterally. And evidence of early left upper lobe involvement on image 58. No consolidation or pleural effusion. Upper Abdomen: Negative visible liver, gallbladder, spleen, pancreas, adrenal glands and bowel in the upper abdomen. Left renal upper pole cyst redemonstrated. Musculoskeletal: No acute or suspicious osseous lesion. Review of the MIP images confirms the above findings. IMPRESSION: 1. Intermittent respiratory motion degrades the subsegmental and distal branches. No acute pulmonary embolus is identified. 2. Multifocal lower lobe peribronchial and peripheral pulmonary opacity is most compatible with acute bilateral bronchopneumonia. Early left upper lobe involvement. No consolidation or pleural effusion. 3. Stable post treatment appearance of the right lung and hilum. Stable chronic right apical ground-glass opacity. 4. Aortic Atherosclerosis (ICD10-I70.0). Electronically Signed   By: Genevie Ann M.D.   On: 06/28/2021 04:11   DG Chest Port 1 View  Result Date: 06/28/2021 CLINICAL DATA:  Questionable sepsis. EXAM: PORTABLE CHEST 1 VIEW COMPARISON:  Chest radiograph dated 09/07/2013. FINDINGS: Minimal right lung base atelectasis. No focal consolidation, pleural effusion, or pneumothorax. Postsurgical changes of the right hilum with scarring. The cardiac silhouette is within limits. No acute osseous pathology. IMPRESSION: No active disease. Electronically Signed   By: Anner Crete M.D.   On: 06/28/2021 02:08     EKG: Independently reviewed.  EKG reviewed and patient has ectopic atrial tachycardia.  Right bundle branch block and left anterior fascicular block noted.  Acute ST elevation or depression. QTc prolonged at 545.  Assessment/Plan Principal Problem:   Multifocal pneumonia Alvin Hardy is admitted to Telemetry floor.  Started on Vancomycin and Cefepime for antibiotic coverage. He was treated with steroids and doxycycline by PCP last week but did not improve and symptoms worsened. Cultures obtained in ER and will be monitored.  IVF hydration with LR.  Check CBC, CMP in am.  Supplemental oxygen as needed to keep O2 92-96%.  Notified by RN after pt was seen that he is becoming confused now so will check ABG.    Active Problems:   Dyspnea Albuterol 2 hours as needed Flutter valve every 2 hours for pulmonary toilet    Sepsis  Alvin Hardy meets sepsis criteria with leukocytosis, tachycardia, tachypnea, fever with multifocal pneumonia. Given IV fluid bolus in the emergency room. Continue IVF with LR.  Placed on empiric antibiotics Check serial lactic acid levels.    Essential hypertension Blood pressures are elevated.  Patient uses propranolol intermittently at home.  Cozaar 50 mg p.o. daily started.  Monitor blood pressure    History of lung cancer Followed by oncology and pulmonology    Prolonged QT interval Avoid medications which could further prolong QT interval    DVT prophylaxis: Lovenox for DVT prophylaxis.   Code Status:   Full Code  Family Communication:  Diagnosis and plan discussed with patient.  He verbalizes understanding and agrees with the plan.  Further recommendation to follow as clinical indicated Disposition Plan:   Patient is from:  Home  Anticipated DC to:  Home  Anticipated DC date:  Anticipate 2 midnight or more stay in the hospital to treat acute condition  Admission status:  Inpatient   Yevonne Aline Crue Otero MD [facility]  How to contact the [facility]  Attending or Consulting provider [place] or covering provider during after hours [place], for this patient?   Check the care team in [facility] and look for a) attending/consulting TR[facility] provider listed and b) the [facility] team listed Log into [email or web address] and use [place]'s universal password to access. If you do not have the password, please contact the hospital operator. Locate the [facility] provider you are looking for under [facility] and page to a number that you can be directly reached. If you still have difficulty reaching the provider, please page the [facility] (Director on Call) for the Hospitalists listed on amion for assistance.  06/28/2021, 6:04 AM

## 2021-06-28 NOTE — ED Notes (Signed)
In to check on patient, patient states he moved to the end of the bed and pulled oxygen off to use urinal. Educated patient on the importance of keeping it on and to call for assistance

## 2021-06-28 NOTE — Sepsis Progress Note (Signed)
Elink following for Sepsis Protocol 

## 2021-06-28 NOTE — ED Notes (Signed)
Patient called out stating he needed a nurse immediately. In room to find patient work of breathing increased. Oxygen off face, and appearing anxious. Patient states he got ou tof bed to use urinal and now cant breathe. Placed patient back up in bed. Cleaned sheets and replaced O2 [place] in nose. MD made aware and new orders placed. View EMAR to view new meds and breathing treatment administration

## 2021-06-28 NOTE — Progress Notes (Signed)
Pharmacy Antibiotic Note  Alvin Hardy is a 86 y.o. male admitted on 06/28/2021 with pneumonia.  Pharmacy has been consulted for vancomycin dosing.  06/28/2021 0245 - Vancomycin 1 g IV x 1 administered  Also on cefepime 2 g IV every 8 hours per provider  Plan: Vancomycin 1750 mg IV Q 24 hrs (Goal AUC 400-550. Expected AUC: 519.6 SCr used: 0.92)  Monitor clinical picture, renal function, vancomycin levels if indicated F/U C&S, abx deescalation / LOT   Height: 5\' 10"  (177.8 cm) IBW/kg (Calculated) : 73  Temp (24hrs), Avg:100.7 F (38.2 C), Min:99.7 F (37.6 C), Max:102.6 F (39.2 C)  Recent Labs  Lab 06/28/21 0100 06/28/21 0420 06/28/21 0630  WBC 15.1*  --   --   CREATININE 0.92  --   --   LATICACIDVEN 2.2* 4.7* 4.8*    CrCl cannot be calculated (Unknown ideal weight.).    Allergies  Allergen Reactions   Codeine Anaphylaxis   Hydrocodone-Acetaminophen     Other reaction(s): Other (See Comments) Reactions unknown   Omeprazole Magnesium     Other reaction(s): Other (See Comments) Reaction Unknown   Tramadol     Other reaction(s): Other (See Comments) Reactions Unknown   Decadron [Dexamethasone]     Pt says it is contraindication due to the lithium he is taking   Fenofibrate     Other reaction(s): Other (See Comments) Reactions Unknown   Neomycin-Bacitracin Zn-Polymyx     Other reaction(s): Other (See Comments) Reactions Unknown   Other     Pt. Remarks that all pain med. Make him nauseated    Penicillin G     Other reaction(s): Other (See Comments) Reactions Unknown   Propoxyphene     Other reaction(s): Other (See Comments) Reaction Unknown   Statins     Lipitor, Crestor, Zocor all caused muscle aches   Ubidecarenone     Other reaction(s): Other (See Comments) Reactions Unknown   Zetia [Ezetimibe]     Muscle aches   Zoloft [Sertraline Hcl] Other (See Comments)    REACTION:  unknown    Antimicrobials this admission: 8/28 cefepime >>  8/28  vancomycin >>   Dose adjustments this admission:   Microbiology results: 8/28 BCx: sent 8/28 UCx: sent  8/28 MRSA PCR: ordered  Thank you for allowing pharmacy to be a part of this patient's care.  Suzzanne Cloud, PharmD, BCPS Clinical Pharmacist [place] Please utilize Amion for appropriate phone number to reach the unit pharmacist ([place]) 06/28/2021 7:32 AM

## 2021-06-28 NOTE — ED Provider Notes (Signed)
[facility] DEPT Provider Note   CSN: [id number] Arrival date & time: 06/28/21  0044     History Chief Complaint  Patient presents with   Shortness of Breath    Alvin Hardy is a 86 y.o. male with a history of lung cancer status post lobectomy in remission, longer esophageal reflux, Gilbert's syndrome who presents to the emergency department by EMS from the independent living section of [facility] with a chief complaint of shortness of breath.  The patient reports that he has been short of breath all week.  No known aggravating or alleviating factors.  He was seen by his PCP and started on doxycycline and reports that he has 3 doses left.  He followed back up 2 days ago and had a chest x-ray performed in the clinic.  He was started on a Z-Pak and a brief taper of prednisone (60 mg x 1, 50 mg x 1, 40 mg x 1, etc.) and took his second dose today.  He was also given IM Decadron in the office and was discharged home with supportive cough medication and an albuterol inhaler.  He reports that he has been taking all of his home medications, but his symptoms have continued to worsen.  He reports that he could barely speak more than a word or 2 tonight prior to calling EMS.  He reports associated cough and wheezing.  He did not know that he had a fever until his temperature was checked by EMS.  He reports associated nasal congestion.  He denies chills, chest pain, abdominal pain, nausea, vomiting, diarrhea, back pain, numbness, weakness, headache.   He reports that he is also very concerned as his wife was recently diagnosed with myasthenia gravis.  He has been helping to take care of her, and also does not want to get her sick.  He is a full code.  He has been vaccinated against COVID-19.  No known sick contacts.  The history is provided by the patient and medical records. No language interpreter was used.      Past Medical History:  Diagnosis Date   Allergy     Arthritis    psoriatic arthritis, ? , treated /w mmethotrexate    Bipolar 1 disorder ([place])    BPH (benign prostatic hyperplasia)    Cataract 2010   Bilateral   Chest pain    Depression    Deviated septum    TO THE LEFT   ED (erectile dysfunction)    Finger injury    mallet finger-- 02/13/2013, cast in place, followed by dr. Fredna Dow   GERD (gastroesophageal reflux disease)    Gilbert's syndrome    History of radiation therapy 07/24/13-08/27/13   50Gy/25 fx chest   Hyperlipidemia    Hypogonadism male    Laryngopharyngeal reflux    lung ca dx'd 12/2012   rul   Lung mass 02/07/13   RIGHT UPPER LOBE   Morton's neuroma    LEFT FOOT   Neuromuscular disorder ([place])    peripheral neuropathy   Shortness of breath    voice breaks, ? related to reflux, although told by Dr. Ardis Hughs, S.- early 5 yr. old    Patient Active Problem List   Diagnosis Date Noted   Multifocal pneumonia 06/28/2021   Osteoarthritis of left glenohumeral joint 12/22/2018   Aortic atherosclerosis ([place]) 07/10/2018   Bursitis of elbow 07/10/2018   Diarrhea 07/10/2018   DJD (degenerative joint disease) 07/10/2018   Finger pain 07/10/2018  Foot pain 07/10/2018   GERD (gastroesophageal reflux disease) 07/10/2018   Hoarseness 07/10/2018   Impaired fasting glucose 07/10/2018   Low back pain 07/10/2018   Neck mass 07/10/2018   Obesity 07/10/2018   Other psoriasis 07/10/2018   Postnasal drip 07/10/2018   Prostatic hypertrophy 07/10/2018   Rectal bleeding 07/10/2018   Sinusitis 07/10/2018   Sore throat 07/10/2018   UTI (urinary tract infection) 07/10/2018   Dyspnea 02/04/2014   Hyperlipidemia 01/25/2014   Chest pain 01/25/2014   Claudication ([place]) 01/25/2014   BPH (benign prostatic hyperplasia)    Hypogonadism male    Bipolar 1 disorder ([place])    ED (erectile dysfunction)    Neuromuscular disorder ([place])    Laryngopharyngeal reflux    Gilbert's syndrome    Morton's neuroma    Lung cancer, Right Upper Lobe      Past Surgical History:  Procedure Laterality Date   CYSTOSCOPY N/A 02/21/2013   Procedure: CYSTOSCOPY FLEXIBLE with insertion of foley catheter;  Surgeon: Fredricka Bonine, MD;  Location: [facility];  Service: Urology;  Laterality: N/A;   EYE SURGERY     cataracts removed fr. both eyes, IOL in place    KNEE ARTHROSCOPY Right 01/2007   PILONIDAL CYST EXCISION  1960's   PROSTATE SURGERY  1996   TURP   VIDEO ASSISTED THORACOSCOPY (VATS)/WEDGE RESECTION Right 02/21/2013   Procedure: Right VIDEO ASSISTED THORACOSCOPY ,Thoracotomy with right upper lobectomy, node sampling ;  Surgeon: Grace Isaac, MD;  Location: [facility];  Service: Thoracic;  Laterality: Right;   VIDEO BRONCHOSCOPY N/A 02/21/2013   Procedure: VIDEO BRONCHOSCOPY;  Surgeon: Grace Isaac, MD;  Location: [facility];  Service: Thoracic;  Laterality: N/A;       Family History  Problem Relation Age of Onset   Diabetes Mother    Cancer Mother        abdomen   Heart disease Father    Diabetes Son    Cancer Maternal Aunt        lung ca, smoker    Social History   Tobacco Use   Smoking status: Former    Types: Cigarettes    Quit date: 11/01/1978    Years since quitting: 42.6   Smokeless tobacco: Never  Substance Use Topics   Alcohol use: Yes    Comment: occas.   Drug use: No    Home Medications Prior to Admission medications   Medication Sig Start Date End Date Taking? Authorizing Provider  aspirin EC 81 MG tablet Take 81 mg by mouth every morning.     [provider]  Calcium Carbonate-Vitamin D 600-400 MG-UNIT tablet Take 1 tablet by mouth every morning.    [provider]  Cholecalciferol (VITAMIN D-3) 1000 UNITS CAPS Take 1,000 Units by mouth every morning.     [provider]  clobetasol cream (TEMOVATE) 0.93 % Apply 1 application topically daily. Reported on 01/27/2016    [provider]  clonazePAM (KLONOPIN) 0.5 MG tablet Take 0.5 mg by mouth at bedtime.    [provider]  cyanocobalamin 1000 MCG tablet Take 1,000 mcg by mouth every morning.     [provider]  esomeprazole (NEXIUM) 40 MG capsule Take 40 mg by mouth daily before breakfast.    [provider]  finasteride (PROSCAR) 5 MG tablet Take 5 mg by mouth daily before breakfast.     [provider]  gabapentin (NEURONTIN) 300 MG capsule Take 1,200 mg by mouth at bedtime. He takes  two capsules every morning and three capsules at bedtime.    [provider]  lithium carbonate (LITHOBID) 300 MG CR tablet Take 600 mg by mouth at bedtime.     [provider]  MELATONIN PO Take by mouth.    [provider]  Multiple Vitamin (MULTIVITAMIN WITH MINERALS) TABS tablet Take 1 tablet by mouth every morning.    [provider]  OVER THE COUNTER MEDICATION Take 1 tablet by mouth 2 (two) times daily. Glucosamine Chondroitin 1500mg /1200mg     [provider]  Probiotic Product (PROBIOTIC DAILY PO) Take 1 each by mouth daily.    [provider]  propranolol (INDERAL) 10 MG tablet Take 10-20 mg by mouth daily as needed (For extremely stressful circumstances.).     [provider]  REPATHA SURECLICK 176 MG/ML SOAJ Every 15 days 05/28/16   [provider]  tamsulosin (FLOMAX) 0.4 MG CAPS capsule Take 0.4 mg by mouth daily. 12/19/14   [provider]  terbinafine (LAMISIL) 250 MG tablet Take 250 mg by mouth daily. 01/25/20   [provider]  testosterone cypionate (DEPOTESTOTERONE CYPIONATE) 200 MG/ML injection Inject 0.5 mLs into the muscle every 21 ( twenty-one) days. Now 300mg /ml 11/05/14   [provider]  vardenafil (LEVITRA) 5 MG tablet Take 5 mg by mouth daily as needed for erectile dysfunction.     [provider]    Allergies    Codeine, Hydrocodone-acetaminophen, Omeprazole magnesium, Tramadol, Decadron [dexamethasone], Fenofibrate, Neomycin-bacitracin zn-polymyx, Other,  Penicillin g, Propoxyphene, Statins, Ubidecarenone, Zetia [ezetimibe], and Zoloft [sertraline hcl]  Review of Systems   Review of Systems  Constitutional:  Positive for fever. Negative for appetite change, chills and diaphoresis.  HENT:  Positive for congestion. Negative for rhinorrhea and sore throat.   Respiratory:  Positive for cough, shortness of breath and wheezing.   Cardiovascular:  Negative for chest pain.  Gastrointestinal:  Negative for abdominal pain, constipation, diarrhea, nausea and vomiting.  Genitourinary:  Negative for dysuria.  Musculoskeletal:  Negative for arthralgias, back pain, gait problem, myalgias, neck pain and neck stiffness.  Skin:  Negative for rash and wound.  Allergic/Immunologic: Negative for immunocompromised state.  Neurological:  Negative for dizziness, seizures, syncope, weakness, numbness and headaches.  Psychiatric/Behavioral:  Negative for confusion.    Physical Exam Updated Vital Signs BP 128/65   Pulse (!) 123   Temp 99.8 F (37.7 C) (Rectal)   Resp (!) 27   Ht 5\' 10"  (1.778 m)   SpO2 95%   BMI 27.22 kg/m   Physical Exam Vitals and nursing note reviewed.  Constitutional:      General: He is in acute distress.     Appearance: He is well-developed.  HENT:     Head: Normocephalic.  Eyes:     Conjunctiva/sclera: Conjunctivae normal.  Cardiovascular:     Rate and Rhythm: Regular rhythm. Tachycardia present.     Heart sounds: No murmur heard. Pulmonary:     Comments: Tachypneic with accessory muscle use.  No retractions.  Lung sounds are diminished throughout and he has diffuse inspiratory and expiratory wheezes.  No rhonchi or rales.  No stridor.  Only able to speak in short phrases. Abdominal:     General: There is no distension.     Palpations: Abdomen is soft. There is no mass.     Tenderness: There is no abdominal tenderness. There is no right CVA tenderness, left CVA tenderness, guarding or rebound.     Hernia: No hernia is  present.  Comments: Abdomen is protuberant, but soft and nontender.  Musculoskeletal:     Cervical back: Neck supple.     Comments: 1+ pitting edema noted to the bilateral lower legs.  Skin:    General: Skin is warm and dry.  Neurological:     General: No focal deficit present.     Mental Status: He is alert.     Comments: GCS 15.  Psychiatric:        Behavior: Behavior normal.    ED Results / Procedures / Treatments   Labs (all labs ordered are listed, but only abnormal results are displayed) Labs Reviewed  LACTIC ACID, PLASMA - Abnormal; Notable for the following components:      Result Value   Lactic Acid, Venous 2.2 (*)    All other components within normal limits  LACTIC ACID, PLASMA - Abnormal; Notable for the following components:   Lactic Acid, Venous 4.7 (*)    All other components within normal limits  COMPREHENSIVE METABOLIC PANEL - Abnormal; Notable for the following components:   Glucose, Bld 173 (*)    All other components within normal limits  CBC WITH DIFFERENTIAL/PLATELET - Abnormal; Notable for the following components:   WBC 15.1 (*)    Neutro Abs 12.3 (*)    Monocytes Absolute 1.6 (*)    Abs Immature Granulocytes 0.14 (*)    All other components within normal limits  BRAIN NATRIURETIC PEPTIDE - Abnormal; Notable for the following components:   B Natriuretic Peptide 110.0 (*)    All other components within normal limits  RESP PANEL BY RT-PCR (FLU A&B, COVID) ARPGX2  CULTURE, BLOOD (ROUTINE X 2)  CULTURE, BLOOD (ROUTINE X 2)  URINE CULTURE  PROTIME-INR  APTT  URINALYSIS, ROUTINE W REFLEX MICROSCOPIC    EKG EKG Interpretation  Date/Time:  Sunday June 28 2021 01:07:33 EDT Ventricular Rate:  135 PR Interval:  146 QRS Duration: 127 QT Interval:  363 QTC Calculation: 545 R Axis:   243 Text Interpretation: Ectopic atrial tachycardia, unifocal RBBB and LAFB Inferior infarct, old Lateral leads are also involved Confirmed by Addison Lank  [phone]) on 06/28/2021 1:33:40 AM  Radiology CT Angio Chest PE W and/or Wo Contrast  Result Date: 06/28/2021 CLINICAL DATA:  86 year old male recently diagnosed with bronchitis. Fever of 102.1. Shortness of breath. History of lung cancer status post right upper lobectomy and radiation. EXAM: CT ANGIOGRAPHY CHEST WITH CONTRAST TECHNIQUE: Multidetector CT imaging of the chest was performed using the standard protocol during bolus administration of intravenous contrast. Multiplanar CT image reconstructions and MIPs were obtained to evaluate the vascular anatomy. CONTRAST:  53mL OMNIPAQUE IOHEXOL 350 MG/ML SOLN COMPARISON:  Portable chest 0149 hours today. Chest CT 01/26/2021 and earlier. FINDINGS: Cardiovascular: Good contrast bolus timing in the pulmonary arterial tree. Intermittent respiratory motion most pronounced at the lung apices and the lung bases. No central or hilar pulmonary artery filling defect. No segmental branch filling defect identified. Stable aorta with atherosclerosis. Heart size remains normal. No pericardial effusion. Mediastinum/Nodes: Stable mediastinum and hila with post treatment changes on the right. No lymphadenopathy. Lungs/Pleura: Status post right upper lobectomy with postradiation fibrosis about the right hilum. Major airways are stable. Mild respiratory motion. Stable right apical ground-glass opacity measuring 15 mm. But there is new multifocal peribronchial and peripheral/subpleural pulmonary opacity in both lower lobes greater on the left (series 6, image 81) which appears infectious/inflammatory. Multiple lower lobe segments affected bilaterally. And evidence of early left upper lobe involvement on image 54. No consolidation  or pleural effusion. Upper Abdomen: Negative visible liver, gallbladder, spleen, pancreas, adrenal glands and bowel in the upper abdomen. Left renal upper pole cyst redemonstrated. Musculoskeletal: No acute or suspicious osseous lesion. Review of the MIP  images confirms the above findings. IMPRESSION: 1. Intermittent respiratory motion degrades the subsegmental and distal branches. No acute pulmonary embolus is identified. 2. Multifocal lower lobe peribronchial and peripheral pulmonary opacity is most compatible with acute bilateral bronchopneumonia. Early left upper lobe involvement. No consolidation or pleural effusion. 3. Stable post treatment appearance of the right lung and hilum. Stable chronic right apical ground-glass opacity. 4. Aortic Atherosclerosis (ICD10-I70.0). Electronically Signed   By: Genevie Ann M.D.   On: 06/28/2021 04:11   DG Chest Port 1 View  Result Date: 06/28/2021 CLINICAL DATA:  Questionable sepsis. EXAM: PORTABLE CHEST 1 VIEW COMPARISON:  Chest radiograph dated 09/07/2013. FINDINGS: Minimal right lung base atelectasis. No focal consolidation, pleural effusion, or pneumothorax. Postsurgical changes of the right hilum with scarring. The cardiac silhouette is within limits. No acute osseous pathology. IMPRESSION: No active disease. Electronically Signed   By: Anner Crete M.D.   On: 06/28/2021 02:08    Procedures .Critical Care  Date/Time: 06/28/2021 5:03 AM Performed by: Joanne Gavel, PA-C Authorized by: Joanne Gavel, PA-C   Critical care provider statement:    Critical care time (minutes):  55   Critical care time was exclusive of:  Separately billable procedures and treating other patients and teaching time   Critical care was necessary to treat or prevent imminent or life-threatening deterioration of the following conditions:  Sepsis   Critical care was time spent personally by me on the following activities:  Ordering and performing treatments and interventions, ordering and review of laboratory studies, ordering and review of radiographic studies, pulse oximetry, re-evaluation of patient's condition, review of old charts, obtaining history from patient or surrogate, examination of patient and evaluation of  patient's response to treatment   I assumed direction of critical care for this patient from another provider in my specialty: no     Care discussed with: admitting provider     Medications Ordered in ED Medications  lactated ringers infusion ( Intravenous New Bag/Given 06/28/21 0119)  ipratropium-albuterol (DUONEB) 0.5-2.5 (3) MG/3ML nebulizer solution 3 mL (3 mLs Nebulization Given 06/28/21 0118)  methylPREDNISolone sodium succinate (SOLU-MEDROL) 125 mg/2 mL injection 125 mg (125 mg Intravenous Given 06/28/21 0118)  levalbuterol (XOPENEX) nebulizer solution 1.25 mg (1.25 mg Nebulization Given 06/28/21 0244)  lactated ringers bolus 1,000 mL (0 mLs Intravenous Stopped 06/28/21 0319)  ceFEPIme (MAXIPIME) 2 g in sodium chloride 0.9 % 100 mL IVPB (0 g Intravenous Stopped 06/28/21 0250)  vancomycin (VANCOCIN) IVPB 1000 mg/200 mL premix (0 mg Intravenous Stopped 06/28/21 0345)  magnesium sulfate IVPB 2 g 50 mL (0 g Intravenous Stopped 06/28/21 0315)  lactated ringers bolus 2,000 mL (0 mLs Intravenous Stopped 06/28/21 0425)  iohexol (OMNIPAQUE) 350 MG/ML injection 80 mL (80 mLs Intravenous Contrast Given 06/28/21 5852)    ED Course  I have reviewed the triage vital signs and the nursing notes.  Pertinent labs & imaging results that were available during my care of the patient were reviewed by me and considered in my medical decision making (see chart for details).    MDM Rules/Calculators/A&P                           86 year old male with a history of lung cancer status post lobectomy  in remission, longer esophageal reflux, Gilbert's syndrome who presents the emergency department by EMS with a 1 week history of shortness of breath, cough, wheezing, nasal congestion.  He was found to be febrile when his temperature was checked by EMS.  Tachycardic in the 130s in the ED.  Febrile to 102.6.  He is tachypneic and has respiratory distress.  No hypoxia.  Code sepsis was initiated based on his initial  presentation.  Given concern for pulmonary etiology he was given cefepime and vancomycin as he is recently been on doxycycline and azithromycin prescribed by his PCP.  The patient was seen and independently evaluated by Dr. Leonette Monarch, attending physician.  CT imaging was obtained, which demonstrates multifocal pneumonia with opacities in the bilateral lung bases.  No PE.  He has a leukocytosis, which could be related to sepsis, but could also be related to recent prednisone use.  Initial lactate was elevated, likely secondary to sepsis, and he was treated with a 30 cc/kg bolus.  No metabolic derangements.  After 2 Xopenex treatments, magnesium, and Solu-Medrol, patient's work of breathing was significantly improved.  He also continued to improve after he was placed on 2 L nasal cannula.  He was treated with cefepime and vancomycin for sepsis.  COVID-19 test was negative.  He is critically ill and will require hospitalization for stabilization and continued evaluation.  Spoke with Dr. Tonie Griffith, hospitalist, who will accept the patient for admission. The patient appears reasonably stabilized for admission considering the current resources, flow, and capabilities available in the ED at this time, and I doubt any other [facility] requiring further screening and/or treatment in the ED prior to admission.   Final Clinical Impression(s) / ED Diagnoses Final diagnoses:  Multifocal pneumonia  Sepsis with acute organ dysfunction without septic shock, due to unspecified organism, unspecified type [facility])    Rx / DC Orders ED Discharge Orders     None        Joanne Gavel, PA-C 06/28/21 0532    Fatima Blank, MD 06/28/21 [phone]

## 2021-06-28 NOTE — Progress Notes (Signed)
RT assessed patient per MD. Patient is receiving antibiotics, steroids and breathing treatment. Patient is diminished and wheezy.Patient is on room air and is stable.

## 2021-06-28 NOTE — Progress Notes (Signed)
A consult was received from an ED physician for vancomycin and cefepime per pharmacy dosing.  The patient's profile has been reviewed for ht/wt/allergies/indication/available labs.   A one time order has been placed for vancomycin 1gm and cefepime 2gm  .  Further antibiotics/pharmacy consults should be ordered by admitting physician if indicated.                       Thank you, Dolly Rias RPh 06/28/2021, 1:52 AM

## 2021-06-28 NOTE — ED Notes (Signed)
Patient requesting bipap off. States he does not like that mask. Put bipap on standby and placed patient back on [place]

## 2021-06-28 NOTE — ED Notes (Signed)
Condom cath placed on pt due to frequent urination. DO recommended.

## 2021-06-28 NOTE — Progress Notes (Signed)
Dr. Tonie Griffith aware of elevated lactic with additional orders placed

## 2021-06-28 NOTE — ED Triage Notes (Signed)
Pt came in via EMS from [facility]. He was diagnosed with bronchitis last week. Pt c/o SOB. RA sats on arrival were 93%. HR 130, temp 102.1. He got 100mg  of Tylenol, 800mg  Motrin. Pt has had 2 duonebs.

## 2021-06-28 NOTE — Progress Notes (Addendum)
  PROGRESS NOTE  Patient admitted earlier this morning. See H&P.   Alvin Hardy is a 86 y.o. male with medical history significant for lung cancer status post lobectomy in remission, GERD, Gilbert's syndrome who presents to the emergency department by EMS from the independent living section of [facility] with SOB and fever.  Patient reports that he has been short of breath all week and is progressively gotten worse.  He is not sure what set off his shortness of breath.  States he was seen by his PCP a few days ago and was started on doxycycline did not improve and went back to his doctor and then was placed on a Z-Pak and prednisone. He was given dose of Decadron in the office and given in albuterol inhaler and cough medication.  Despite these interventions he continued to get worse and has increased shortness of breath. He was found to have multifocal pneumonia on CT of his chest. COVID-19 negative.  Influenza A and B are negative.  Started on empiric antibiotics with vancomycin and cefepime.  On examination, he remains on 3 L nasal cannula O2 with mild conversational dyspnea.  He has diffuse wheezes bilaterally.  He does not appear to be confused, tells me that he lives in an apartment with his wife and cares for her, she has myasthenia gravis.  He is worried about giving her any sort of respiratory infection.  They have children who live out of state.  He admits to nonproductive cough.  Addendum: Patient re-evaluated once he was transferred to stepdown unit. He reports that he did not tolerate BiPAP well due to claustrophobia. He is much more comfortable and less anxious after receiving ativan.   Acute hypoxemic respiratory failure with multifocal pneumonia -Remains on nasal cannula O2, may require BiPAP for respiratory support for dyspnea  Sepsis secondary to multifocal pneumonia, sepsis present on admission -Failed outpatient antibiotic treatment -Continue cefepime -MRSA PCR negative, stop  IV Vanco -Blood cultures pending -Due to diffuse wheezing, added steroids as well as breathing treatments  Hypertension -Continue Cozaar  History of non-small cell lung cancer -Status post right upper lobectomy, chemo and radiation -Followed by Dr. Julien Nordmann on yearly basis  BPH -Continue proscar, flomax    Status is: Inpatient  Remains inpatient appropriate because:Hemodynamically unstable  Dispo: The patient is from: Home              Anticipated d/c is to: Home              Patient currently is not medically stable to d/c.   Difficult to place patient No       Dessa Phi, DO [facility] 06/28/2021, 1:52 PM  Available via Epic secure chat 7am-7pm After these hours, please refer to coverage provider listed on [email or web address]

## 2021-06-28 NOTE — ED Notes (Signed)
Pt finished a round of ABX. He is currently on prednisone and has been prescribed an albuterol inhaler and mucinex

## 2021-06-29 ENCOUNTER — Inpatient Hospital Stay (HOSPITAL_COMMUNITY): Payer: Medicare Other

## 2021-06-29 DIAGNOSIS — J189 Pneumonia, unspecified organism: Secondary | ICD-10-CM

## 2021-06-29 LAB — RESPIRATORY PANEL BY PCR

## 2021-06-29 LAB — CBC
HCT: 42.5 % (ref 39.0–52.0)
Hemoglobin: 13.1 g/dL (ref 13.0–17.0)
MCH: 27.9 pg (ref 26.0–34.0)
MCHC: 30.8 g/dL (ref 30.0–36.0)
MCV: 90.6 fL (ref 80.0–100.0)
Platelets: 194 10*3/uL (ref 150–400)
RBC: 4.69 MIL/uL (ref 4.22–5.81)
RDW: 14.3 % (ref 11.5–15.5)
WBC: 12.4 10*3/uL — ABNORMAL HIGH (ref 4.0–10.5)
nRBC: 0 % (ref 0.0–0.2)

## 2021-06-29 LAB — COMPREHENSIVE METABOLIC PANEL
ALT: 18 U/L (ref 0–44)
AST: 22 U/L (ref 15–41)
Albumin: 3.2 g/dL — ABNORMAL LOW (ref 3.5–5.0)
Alkaline Phosphatase: 38 U/L (ref 38–126)
Anion gap: 6 (ref 5–15)
BUN: 16 mg/dL (ref 8–23)
CO2: 26 mmol/L (ref 22–32)
Calcium: 8.5 mg/dL — ABNORMAL LOW (ref 8.9–10.3)
Chloride: 108 mmol/L (ref 98–111)
Creatinine, Ser: 0.93 mg/dL (ref 0.61–1.24)
GFR, Estimated: >60 mL/min (ref >60)
Glucose, Bld: 115 mg/dL — ABNORMAL HIGH (ref 70–99)
Potassium: 4.3 mmol/L (ref 3.5–5.1)
Sodium: 140 mmol/L (ref 135–145)
Total Bilirubin: 0.7 mg/dL (ref 0.3–1.2)
Total Protein: 5.9 g/dL — ABNORMAL LOW (ref 6.5–8.1)

## 2021-06-29 LAB — BLOOD GAS, ARTERIAL
Acid-base deficit: 2.2 mmol/L — ABNORMAL HIGH (ref 0.0–2.0)
Bicarbonate: 24.8 mmol/L (ref 20.0–28.0)
FIO2: 100
O2 Saturation: 99.4 %
Patient temperature: 98.6
pCO2 arterial: 54.2 mmHg — ABNORMAL HIGH (ref 32.0–48.0)
pH, Arterial: 7.283 — ABNORMAL LOW (ref 7.350–7.450)
pO2, Arterial: 312 mmHg — ABNORMAL HIGH (ref 83.0–108.0)

## 2021-06-29 LAB — URINE CULTURE: Culture: <10000 — AB

## 2021-06-29 LAB — LACTIC ACID, PLASMA: Lactic Acid, Venous: 1.3 mmol/L (ref 0.5–1.9)

## 2021-06-29 MED ORDER — LIDOCAINE HCL (CARDIAC) PF 100 MG/5ML IV SOSY
PREFILLED_SYRINGE | INTRAVENOUS | Status: AC
  Filled 2021-06-29: qty 5

## 2021-06-29 MED ORDER — PROPRANOLOL HCL 10 MG PO TABS
10.0000 mg | ORAL_TABLET | Freq: Every day | ORAL | Status: DC
  Administered 2021-06-30 – 2021-07-01 (×2): 10 mg
  Filled 2021-06-29 (×2): qty 1

## 2021-06-29 MED ORDER — SUCCINYLCHOLINE CHLORIDE 200 MG/10ML IV SOSY
PREFILLED_SYRINGE | INTRAVENOUS | Status: AC
  Filled 2021-06-29: qty 10

## 2021-06-29 MED ORDER — ETOMIDATE 2 MG/ML IV SOLN
INTRAVENOUS | Status: AC
  Administered 2021-06-29: 20 mg
  Filled 2021-06-29: qty 20

## 2021-06-29 MED ORDER — POLYETHYLENE GLYCOL 3350 17 G PO PACK
17.0000 g | PACK | Freq: Every day | ORAL | Status: DC
  Administered 2021-06-30 – 2021-07-01 (×2): 17 g
  Filled 2021-06-29 (×2): qty 1

## 2021-06-29 MED ORDER — ORAL CARE MOUTH RINSE
15.0000 mL | OROMUCOSAL | Status: DC
  Administered 2021-06-30 – 2021-07-01 (×14): 15 mL via OROMUCOSAL

## 2021-06-29 MED ORDER — MOMETASONE FURO-FORMOTEROL FUM 200-5 MCG/ACT IN AERO
2.0000 | INHALATION_SPRAY | Freq: Two times a day (BID) | RESPIRATORY_TRACT | Status: DC
  Filled 2021-06-29: qty 8.8

## 2021-06-29 MED ORDER — LITHIUM CITRATE 300 MG/5 ML PO SYRP
200.0000 mg | Freq: Three times a day (TID) | ORAL | Status: DC
  Filled 2021-06-29: qty 3.3

## 2021-06-29 MED ORDER — LITHIUM CARBONATE 300 MG PO CAPS
300.0000 mg | ORAL_CAPSULE | Freq: Two times a day (BID) | ORAL | Status: DC
  Filled 2021-06-29: qty 1

## 2021-06-29 MED ORDER — PROPRANOLOL HCL 10 MG PO TABS
10.0000 mg | ORAL_TABLET | Freq: Every day | ORAL | Status: DC
  Administered 2021-06-29: 10 mg via ORAL
  Filled 2021-06-29: qty 1

## 2021-06-29 MED ORDER — HYDROXYZINE HCL 25 MG PO TABS
25.0000 mg | ORAL_TABLET | Freq: Three times a day (TID) | ORAL | Status: DC | PRN
  Administered 2021-06-29: 25 mg via ORAL
  Filled 2021-06-29: qty 1

## 2021-06-29 MED ORDER — CHLORHEXIDINE GLUCONATE 0.12% ORAL RINSE (MEDLINE KIT)
15.0000 mL | Freq: Two times a day (BID) | OROMUCOSAL | Status: DC
  Administered 2021-06-29: 15 mL via OROMUCOSAL

## 2021-06-29 MED ORDER — FENTANYL CITRATE (PF) 100 MCG/2ML IJ SOLN: 25.0000 ug | INTRAMUSCULAR | Status: DC | PRN

## 2021-06-29 MED ORDER — LEVALBUTEROL HCL 1.25 MG/0.5ML IN NEBU
1.2500 mg | INHALATION_SOLUTION | Freq: Four times a day (QID) | RESPIRATORY_TRACT | Status: DC | PRN
  Administered 2021-06-29: 1.25 mg via RESPIRATORY_TRACT
  Filled 2021-06-29 (×2): qty 0.5

## 2021-06-29 MED ORDER — DM-GUAIFENESIN ER 30-600 MG PO TB12
1.0000 | ORAL_TABLET | Freq: Two times a day (BID) | ORAL | Status: DC
  Administered 2021-06-29: 1 via ORAL
  Filled 2021-06-29: qty 1

## 2021-06-29 MED ORDER — ROCURONIUM BROMIDE 10 MG/ML (PF) SYRINGE
PREFILLED_SYRINGE | INTRAVENOUS | Status: AC
  Administered 2021-06-29: 90 mg
  Filled 2021-06-29: qty 10

## 2021-06-29 MED ORDER — FENTANYL 2500MCG IN NS 250ML (10MCG/ML) PREMIX INFUSION
25.0000 ug/h | INTRAVENOUS | Status: DC
  Administered 2021-06-29: 25 ug/h via INTRAVENOUS
  Administered 2021-06-30: 125 ug/h via INTRAVENOUS
  Filled 2021-06-29 (×2): qty 250

## 2021-06-29 MED ORDER — GABAPENTIN 250 MG/5ML PO SOLN
600.0000 mg | Freq: Every day | ORAL | Status: DC
  Administered 2021-06-30 – 2021-07-01 (×2): 600 mg
  Filled 2021-06-29 (×2): qty 12

## 2021-06-29 MED ORDER — CLONAZEPAM 0.5 MG PO TABS
0.5000 mg | ORAL_TABLET | Freq: Every day | ORAL | Status: DC
  Administered 2021-06-30: 0.5 mg
  Filled 2021-06-29 (×2): qty 1

## 2021-06-29 MED ORDER — ASPIRIN 81 MG PO CHEW
81.0000 mg | CHEWABLE_TABLET | Freq: Every day | ORAL | Status: DC
  Administered 2021-06-30 – 2021-07-01 (×2): 81 mg
  Filled 2021-06-29 (×2): qty 1

## 2021-06-29 MED ORDER — MIDAZOLAM HCL 2 MG/2ML IJ SOLN
INTRAMUSCULAR | Status: AC
  Administered 2021-06-29: 1 mg
  Filled 2021-06-29: qty 2

## 2021-06-29 MED ORDER — FENTANYL CITRATE (PF) 100 MCG/2ML IJ SOLN
INTRAMUSCULAR | Status: AC
  Administered 2021-06-29: 50 ug
  Filled 2021-06-29: qty 2

## 2021-06-29 MED ORDER — DOCUSATE SODIUM 50 MG/5ML PO LIQD
100.0000 mg | Freq: Two times a day (BID) | ORAL | Status: DC
  Administered 2021-06-29 – 2021-07-01 (×4): 100 mg
  Filled 2021-06-29 (×4): qty 10

## 2021-06-29 MED ORDER — LABETALOL HCL 5 MG/ML IV SOLN
10.0000 mg | INTRAVENOUS | Status: DC | PRN
  Administered 2021-06-29: 10 mg via INTRAVENOUS
  Filled 2021-06-29 (×2): qty 4

## 2021-06-29 MED ORDER — FENTANYL CITRATE (PF) 100 MCG/2ML IJ SOLN
25.0000 ug | INTRAMUSCULAR | Status: DC | PRN
  Administered 2021-06-29: 50 ug via INTRAVENOUS
  Filled 2021-06-29: qty 2

## 2021-06-29 MED ORDER — STERILE WATER FOR INJECTION IJ SOLN
INTRAMUSCULAR | Status: AC
  Filled 2021-06-29: qty 10

## 2021-06-29 MED ORDER — FENTANYL BOLUS VIA INFUSION
25.0000 ug | INTRAVENOUS | Status: DC | PRN
  Administered 2021-06-29 (×2): 50 ug via INTRAVENOUS
  Administered 2021-06-30: 100 ug via INTRAVENOUS
  Administered 2021-06-30: 75 ug via INTRAVENOUS
  Administered 2021-06-30 (×2): 50 ug via INTRAVENOUS
  Administered 2021-06-30: 75 ug via INTRAVENOUS
  Administered 2021-06-30: 100 ug via INTRAVENOUS
  Administered 2021-06-30: 50 ug via INTRAVENOUS
  Filled 2021-06-29: qty 100

## 2021-06-29 MED ORDER — VECURONIUM BROMIDE 10 MG IV SOLR
INTRAVENOUS | Status: AC
  Filled 2021-06-29: qty 10

## 2021-06-29 MED ORDER — GABAPENTIN 250 MG/5ML PO SOLN
900.0000 mg | Freq: Every day | ORAL | Status: DC
  Administered 2021-06-30: 900 mg
  Filled 2021-06-29 (×2): qty 18

## 2021-06-29 MED ORDER — FENTANYL CITRATE (PF) 100 MCG/2ML IJ SOLN: 25.0000 ug | Freq: Once | INTRAMUSCULAR | Status: AC

## 2021-06-29 MED ORDER — LOSARTAN POTASSIUM 50 MG PO TABS
50.0000 mg | ORAL_TABLET | Freq: Every day | ORAL | Status: DC
  Administered 2021-06-30 – 2021-07-01 (×2): 50 mg
  Filled 2021-06-29 (×2): qty 1

## 2021-06-29 MED ORDER — PHENYLEPHRINE 40 MCG/ML (10ML) SYRINGE FOR IV PUSH (FOR BLOOD PRESSURE SUPPORT)
PREFILLED_SYRINGE | INTRAVENOUS | Status: AC
  Filled 2021-06-29: qty 10

## 2021-06-29 NOTE — Procedures (Signed)
Intubation Procedure Note  Alvin Hardy  [id number]  03-31-1935  Date:06/29/21  Time:2:16 PM   Provider Performing:Raynisha Avilla Jerilynn Mages Ayesha Rumpf    Procedure: Intubation (31500)  Indication(s) Respiratory Failure  Consent Risks of the procedure as well as the alternatives and risks of each were explained to the patient and/or caregiver.  Consent for the procedure was obtained and is signed in the bedside chart   Anesthesia Etomidate, Versed, Fentanyl, and Rocuronium   Time Out Verified patient identification, verified procedure, site/side was marked, verified correct patient position, special equipment/implants available, medications/allergies/relevant history reviewed, required imaging and test results available.   Sterile Technique Usual hand hygeine, masks, and gloves were used   Procedure Description Patient positioned in bed supine.  Sedation given as noted above.  Patient was intubated with endotracheal tube using Glidescope.  View was Grade 1 full glottis .  Number of attempts was 1.  Colorimetric CO2 detector was consistent with tracheal placement.   Complications/Tolerance None; patient tolerated the procedure well. Chest X-ray is ordered to verify placement.   EBL Minimal   Specimen(s) None  The entire procedure was supervised/proctored by Rodman Pickle, MD.  Lestine Mount, PA-C [facility] 06/29/21 2:17 PM  Please see [email or web address] for pager details.

## 2021-06-29 NOTE — Procedures (Signed)
Bronchoscopy Procedure Note  Alvin Hardy  [id number]  [birth year]  Date:06/29/21  Time:3:28 PM   Provider Performing:Quinlyn Tep Rodman Pickle   Procedure(s):  Initial Therapeutic Aspiration of Tracheobronchial Tree [phone])  Indication(s) Hypoxemia  Consent Unable to obtain consent due to emergent nature of procedure.  Anesthesia Fentanyl  Time Out Verified patient identification, verified procedure, site/side was marked, verified correct patient position, special equipment/implants available, medications/allergies/relevant history reviewed, required imaging and test results available.   Sterile Technique Usual hand hygiene, masks, gowns, and gloves were used   Procedure Description Bronchoscope advanced through endotracheal tube and into airway.  Airways were examined down to subsegmental level with findings noted below.   Following diagnostic evaluation, Therapeutic aspiration performed in LUL with removal of white frothy secretions.   Complications/Tolerance None; patient tolerated the procedure well. With improved oxygenation   EBL Minimal   Specimen(s) None

## 2021-06-29 NOTE — Progress Notes (Addendum)
PROGRESS NOTE    Alvin Hardy  DZH:[id number] DOB: 26-Mar-1935 DOA: 06/28/2021 PCP: Crist Infante, MD     Brief Narrative:   Alvin Hardy is a 86 y.o. male with medical history significant for lung cancer status post lobectomy in remission, GERD, Gilbert's syndrome who presents to the emergency department by EMS from the independent living section of [facility] with SOB and fever.  Patient reports that he has been short of breath all week and is progressively gotten worse.  He is not sure what set off his shortness of breath.  States he was seen by his PCP a few days ago and was started on doxycycline did not improve and went back to his doctor and then was placed on a Z-Pak and prednisone. He was given dose of Decadron in the office and given in albuterol inhaler and cough medication.  Despite these interventions he continued to get worse and has increased shortness of breath. He was found to have multifocal pneumonia on CT of his chest. COVID-19 negative.  Influenza A and B are negative.  Started on empiric antibiotics with vancomycin and cefepime as well as IV solumedrol.   New events last 24 hours / Subjective: Patient continues to be highly anxious.  States that he has been waking up with high level of anxiety and shortness of breath.  Has been coughing but nonproductive.  No chest pain.  Addendum: Patient re-examined this afternoon. Continues to be very short of breath, on 4L O2 satting 90%, working harder to breathe and his conversational dyspnea has worsened from previous exam. Will ask PCCM to see.   Assessment & Plan:   Principal Problem:   Multifocal pneumonia Active Problems:   Dyspnea   Sepsis ([place])   History of lung cancer   Essential hypertension   Prolonged QT interval   Acute hypoxemic respiratory failure with multifocal pneumonia -Remains on nasal cannula O2, may require BiPAP for respiratory support for dyspnea   Sepsis secondary to multifocal pneumonia, sepsis  present on admission -Failed outpatient antibiotic treatment -Continue cefepime -MRSA PCR negative, stop IV Vanco -Blood cultures pending -Due to diffuse wheezing, added steroids as well as breathing treatments -Check respiratory viral panel  Hypertension -Continue Cozaar  History of non-small cell lung cancer -Status post right upper lobectomy, chemo and radiation in 2014 -Followed by Dr. Julien Nordmann on yearly basis   BPH -Continue proscar, flomax   Anxiety -Added Ativan and hydroxyzine to use as needed   DVT prophylaxis:  enoxaparin (LOVENOX) injection 40 mg Start: 06/28/21 1000  Code Status:     Code Status Orders  (From admission, onward)           Start     Ordered   06/28/21 0620  Full code  Continuous        06/28/21 0619           Code Status History     This patient has a current code status but no historical code status.      Family Communication: No family at bedside, updated wife over the phone regarding pt's decline  Disposition Plan:  Status is: Inpatient  Remains inpatient appropriate because:Hemodynamically unstable and IV treatments appropriate due to intensity of illness or inability to take PO  Dispo: The patient is from: Home              Anticipated d/c is to: Home              Patient currently  is not medically stable to d/c.   Difficult to place patient No     Antimicrobials:  Anti-infectives (From admission, onward)    Start     Dose/Rate Route Frequency Ordered Stop   06/28/21 1800  vancomycin (VANCOREADY) IVPB 1750 mg/350 mL  Status:  Discontinued        1,750 mg 175 mL/hr over 120 Minutes Intravenous Every 24 hours 06/28/21 0727 06/28/21 1356   06/28/21 0700  ceFEPIme (MAXIPIME) 1 g in sodium chloride 0.9 % 100 mL IVPB  Status:  Discontinued        1 g 200 mL/hr over 30 Minutes Intravenous Every 8 hours 06/28/21 0619 06/28/21 0644   06/28/21 0700  ceFEPIme (MAXIPIME) 2 g in sodium chloride 0.9 % 100 mL IVPB        2  g 200 mL/hr over 30 Minutes Intravenous Every 8 hours 06/28/21 0645     06/28/21 0200  ceFEPIme (MAXIPIME) 2 g in sodium chloride 0.9 % 100 mL IVPB        2 g 200 mL/hr over 30 Minutes Intravenous  Once 06/28/21 0151 06/28/21 0250   06/28/21 0200  vancomycin (VANCOCIN) IVPB 1000 mg/200 mL premix        1,000 mg 200 mL/hr over 60 Minutes Intravenous  Once 06/28/21 0151 06/28/21 0345        Objective: Vitals:   06/29/21 0759 06/29/21 0800 06/29/21 0900 06/29/21 1053  BP:  (!) 168/102 (!) 181/127 (!) 203/114  Pulse:  (!) 115 (!) 116 (!) 120  Resp:  (!) 33 (!) 31   Temp: 98 F (36.7 C)     TempSrc: Oral     SpO2:  92% 98%   Height:        Intake/Output Summary (Last 24 hours) at 06/29/2021 1212 Last data filed at 06/29/2021 3716 Gross per 24 hour  Intake 2400 ml  Output 1600 ml  Net 800 ml   There were no vitals filed for this visit.  Examination:  General exam: Appears but anxious Respiratory system: Diffuse wheezes bilaterally, with increased respiratory effort and increased respiratory rate, mild conversational dyspnea Cardiovascular system: S1 & S2 heard, tachycardic, regular rhythm. No murmurs. No pedal edema. Gastrointestinal system: Abdomen is nondistended, soft and nontender. Normal bowel sounds heard. Central nervous system: Alert and oriented. No focal neurological deficits. Speech clear.  Extremities: Symmetric in appearance  Skin: No rashes, lesions or ulcers on exposed skin  Psychiatry: Judgement and insight appear normal. Mood & affect appropriate.   Data Reviewed: I have personally reviewed following labs and imaging studies  CBC: Recent Labs  Lab 06/28/21 0100 06/29/21 0304  WBC 15.1* 12.4*  NEUTROABS 12.3*  --   HGB 14.5 13.1  HCT 45.4 42.5  MCV 89.5 90.6  PLT 207 967   Basic Metabolic Panel: Recent Labs  Lab 06/28/21 0100 06/29/21 0304  NA 136 140  K 3.6 4.3  CL 103 108  CO2 22 26  GLUCOSE 173* 115*  BUN 21 16  CREATININE 0.92 0.93   CALCIUM 9.1 8.5*   GFR: CrCl cannot be calculated (Unknown ideal weight.). Liver Function Tests: Recent Labs  Lab 06/28/21 0100 06/29/21 0304  AST 28 22  ALT 20 18  ALKPHOS 47 38  BILITOT 1.1 0.7  PROT 6.8 5.9*  ALBUMIN 3.9 3.2*   No results for input(s): LIPASE, AMYLASE in the last 168 hours. No results for input(s): AMMONIA in the last 168 hours. Coagulation Profile: Recent Labs  Lab 06/28/21  0100  INR 1.1   Cardiac Enzymes: No results for input(s): CKTOTAL, CKMB, CKMBINDEX, TROPONINI in the last 168 hours. BNP (last 3 results) No results for input(s): PROBNP in the last 8760 hours. HbA1C: No results for input(s): HGBA1C in the last 72 hours. CBG: No results for input(s): GLUCAP in the last 168 hours. Lipid Profile: No results for input(s): CHOL, HDL, LDLCALC, TRIG, CHOLHDL, LDLDIRECT in the last 72 hours. Thyroid Function Tests: No results for input(s): TSH, T4TOTAL, FREET4, T3FREE, THYROIDAB in the last 72 hours. Anemia Panel: No results for input(s): VITAMINB12, FOLATE, FERRITIN, TIBC, IRON, RETICCTPCT in the last 72 hours. Sepsis Labs: Recent Labs  Lab 06/28/21 0420 06/28/21 0630 06/28/21 0844 06/29/21 0936  LATICACIDVEN 4.7* 4.8* 2.6* 1.3    Recent Results (from the past 240 hour(s))  Resp Panel by RT-PCR (Flu A&B, Covid) Nasopharyngeal Swab     Status: None   Collection Time: 06/28/21  1:00 AM   Specimen: Nasopharyngeal Swab; Nasopharyngeal(NP) swabs in vial transport medium  Result Value Ref Range Status   SARS Coronavirus 2 by RT PCR NEGATIVE NEGATIVE Final    Comment: (NOTE) SARS-CoV-2 target nucleic acids are NOT DETECTED.  The SARS-CoV-2 RNA is generally detectable in upper respiratory specimens during the acute phase of infection. The lowest concentration of SARS-CoV-2 viral copies this assay can detect is 138 copies/mL. A negative result does not preclude SARS-Cov-2 infection and should not be used as the sole basis for treatment or other  patient management decisions. A negative result may occur with  improper specimen collection/handling, submission of specimen other than nasopharyngeal swab, presence of viral mutation(s) within the areas targeted by this assay, and inadequate number of viral copies(<138 copies/mL). A negative result must be combined with clinical observations, patient history, and epidemiological information. The expected result is Negative.  Fact Sheet for Patients:  EntrepreneurPulse.com.au  Fact Sheet for Healthcare Providers:  IncredibleEmployment.be  This test is no t yet approved or cleared by the [place] FDA and  has been authorized for detection and/or diagnosis of SARS-CoV-2 by FDA under an Emergency Use Authorization (EUA). This EUA will remain  in effect (meaning this test can be used) for the duration of the COVID-19 declaration under Section 564(b)(1) of the Act, 21 U.S.C.section 360bbb-3(b)(1), unless the authorization is terminated  or revoked sooner.       Influenza A by PCR NEGATIVE NEGATIVE Final   Influenza B by PCR NEGATIVE NEGATIVE Final    Comment: (NOTE) The Xpert Xpress SARS-CoV-2/FLU/RSV plus assay is intended as an aid in the diagnosis of influenza from Nasopharyngeal swab specimens and should not be used as a sole basis for treatment. Nasal washings and aspirates are unacceptable for Xpert Xpress SARS-CoV-2/FLU/RSV testing.  Fact Sheet for Patients: EntrepreneurPulse.com.au  Fact Sheet for Healthcare Providers: IncredibleEmployment.be  This test is not yet approved or cleared by the [place] FDA and has been authorized for detection and/or diagnosis of SARS-CoV-2 by FDA under an Emergency Use Authorization (EUA). This EUA will remain in effect (meaning this test can be used) for the duration of the COVID-19 declaration under Section 564(b)(1) of the Act, 21 U.S.C. section  360bbb-3(b)(1), unless the authorization is terminated or revoked.  Performed at [facility], [place] [address], [place], [place] [address]   Blood Culture (routine x 2)     Status: None (Preliminary result)   Collection Time: 06/28/21  1:00 AM   Specimen: BLOOD  Result Value Ref Range Status   Specimen  Description   Final    BLOOD BLOOD LEFT ARM Performed at [facility] [address] [place], [place] [address]    Special Requests   Final    BOTTLES DRAWN AEROBIC AND ANAEROBIC Blood Culture adequate volume Performed at [facility] [address] [place], [place] [address]    Culture   Final    NO GROWTH < 24 HOURS Performed at [facility] [address] [place], [place] [address]    Report Status PENDING  Incomplete  Blood Culture (routine x 2)     Status: None (Preliminary result)   Collection Time: 06/28/21  1:00 AM   Specimen: BLOOD  Result Value Ref Range Status   Specimen Description   Final    BLOOD BLOOD RIGHT ARM Performed at [facility] [address] [place], [place] [address]    Special Requests   Final    BOTTLES DRAWN AEROBIC AND ANAEROBIC Blood Culture adequate volume Performed at [facility] [address], [place], [place] [address]    Culture   Final    NO GROWTH < 24 HOURS Performed at [facility] [address], [place], [place] [address]    Report Status PENDING  Incomplete  Urine Culture     Status: Abnormal   Collection Time: 06/28/21  4:50 AM   Specimen: In/Out Cath Urine  Result Value Ref Range Status   Specimen Description   Final    IN/OUT CATH URINE Performed at [facility] [address], [place], [place] [address]    Special Requests   Final    NONE Performed at [facility], [place] [address] [place], [place] [address]    Culture (A)  Final    <10,000 COLONIES/mL INSIGNIFICANT  GROWTH Performed at [facility] [address], [place], [place] [address]    Report Status 06/29/2021 FINAL  Final  MRSA Next Gen by PCR, Nasal     Status: None   Collection Time: 06/28/21  8:23 AM   Specimen: Nasal Mucosa; Nasal Swab  Result Value Ref Range Status   MRSA by PCR Next Gen NOT DETECTED NOT DETECTED Final    Comment: (NOTE) The GeneXpert MRSA Assay (FDA approved for NASAL specimens only), is one component of a comprehensive MRSA colonization surveillance program. It is not intended to diagnose MRSA infection nor to guide or monitor treatment for MRSA infections. Test performance is not FDA approved in patients less than 51 years old. Performed at [facility], [place] [address], [place], [place] [address]       Radiology Studies: CT Angio Chest PE W and/or Wo Contrast  Result Date: 06/28/2021 CLINICAL DATA:  86 year old male recently diagnosed with bronchitis. Fever of 102.1. Shortness of breath. History of lung cancer status post right upper lobectomy and radiation. EXAM: CT ANGIOGRAPHY CHEST WITH CONTRAST TECHNIQUE: Multidetector CT imaging of the chest was performed using the standard protocol during bolus administration of intravenous contrast. Multiplanar CT image reconstructions and MIPs were obtained to evaluate the vascular anatomy. CONTRAST:  51mL OMNIPAQUE IOHEXOL 350 MG/ML SOLN COMPARISON:  Portable chest 0149 hours today. Chest CT 01/26/2021 and earlier. FINDINGS: Cardiovascular: Good contrast bolus timing in the pulmonary arterial tree. Intermittent respiratory motion most pronounced at the lung apices and the lung bases. No central or hilar pulmonary artery filling defect. No segmental branch filling defect identified. Stable aorta with atherosclerosis. Heart size  remains normal. No pericardial effusion. Mediastinum/Nodes: Stable mediastinum and hila with post treatment changes on the right. No lymphadenopathy. Lungs/Pleura: Status post  right upper lobectomy with postradiation fibrosis about the right hilum. Major airways are stable. Mild respiratory motion. Stable right apical ground-glass opacity measuring 15 mm. But there is new multifocal peribronchial and peripheral/subpleural pulmonary opacity in both lower lobes greater on the left (series 6, image 81) which appears infectious/inflammatory. Multiple lower lobe segments affected bilaterally. And evidence of early left upper lobe involvement on image 56. No consolidation or pleural effusion. Upper Abdomen: Negative visible liver, gallbladder, spleen, pancreas, adrenal glands and bowel in the upper abdomen. Left renal upper pole cyst redemonstrated. Musculoskeletal: No acute or suspicious osseous lesion. Review of the MIP images confirms the above findings. IMPRESSION: 1. Intermittent respiratory motion degrades the subsegmental and distal branches. No acute pulmonary embolus is identified. 2. Multifocal lower lobe peribronchial and peripheral pulmonary opacity is most compatible with acute bilateral bronchopneumonia. Early left upper lobe involvement. No consolidation or pleural effusion. 3. Stable post treatment appearance of the right lung and hilum. Stable chronic right apical ground-glass opacity. 4. Aortic Atherosclerosis (ICD10-I70.0). Electronically Signed   By: Genevie Ann M.D.   On: 06/28/2021 04:11   DG Chest Port 1 View  Result Date: 06/28/2021 CLINICAL DATA:  Questionable sepsis. EXAM: PORTABLE CHEST 1 VIEW COMPARISON:  Chest radiograph dated 09/07/2013. FINDINGS: Minimal right lung base atelectasis. No focal consolidation, pleural effusion, or pneumothorax. Postsurgical changes of the right hilum with scarring. The cardiac silhouette is within limits. No acute osseous pathology. IMPRESSION: No active disease. Electronically Signed   By: Anner Crete M.D.   On: 06/28/2021 02:08      Scheduled Meds:  aspirin EC  81 mg Oral q morning   Chlorhexidine Gluconate Cloth  6 each  Topical Daily   clonazePAM  0.5 mg Oral QHS   enoxaparin (LOVENOX) injection  40 mg Subcutaneous Daily   finasteride  5 mg Oral QAC breakfast   gabapentin  600 mg Oral Daily   And   gabapentin  900 mg Oral QHS   ipratropium-albuterol  3 mL Nebulization Q6H   lithium carbonate  600 mg Oral QHS   losartan  50 mg Oral Daily   mouth rinse  15 mL Mouth Rinse BID   methylPREDNISolone (SOLU-MEDROL) injection  40 mg Intravenous Daily   mometasone-formoterol  2 puff Inhalation BID   propranolol  10 mg Oral Daily   tamsulosin  0.4 mg Oral Daily   Continuous Infusions:  ceFEPime (MAXIPIME) IV Stopped (06/29/21 0617)     LOS: 1 day      Time spent: 45 minutes   Dessa Phi, DO [facility] 06/29/2021, 12:12 PM   Available via Epic secure chat 7am-7pm After these hours, please refer to coverage provider listed on [email or web address]

## 2021-06-29 NOTE — Procedures (Signed)
ETT Exchange Procedure Note  Alvin Hardy  [id number]  Oct 25, 1935  Date:06/29/21  Time:3:11 PM   Provider Performing:Caspian Deleonardis Rodman Pickle    Procedure: Intubation ([address])  Indication(s) Respiratory Failure  Consent Risks of the procedure as well as the alternatives and risks of each were explained to the patient and/or caregiver.  Consent for the procedure was obtained and is signed in the bedside chart   Anesthesia Etomidate, Versed, Fentanyl, and Rocuronium   Time Out Verified patient identification, verified procedure, site/side was marked, verified correct patient position, special equipment/implants available, medications/allergies/relevant history reviewed, required imaging and test results available.   Sterile Technique Usual hand hygeine, masks, and gloves were used   Procedure Description Patient positioned in bed supine.  Sedation given as noted above.  Patient was intubated with endotracheal tube in place. Using Glidescope, ETT was exchanged for size #8 ETT .  View was Grade 1 full glottis .  Number of attempts was 1.  Colorimetric CO2 detector was consistent with tracheal placement.   Complications/Tolerance None; patient tolerated the procedure well. Chest X-ray is ordered to verify placement.   EBL Minimal   Specimen(s) None

## 2021-06-29 NOTE — Consult Note (Signed)
NAME:  Alvin Hardy, MRN:  [id number], DOB:  1935-04-27, LOS: 1 ADMISSION DATE:  06/28/2021 CONSULTATION DATE:  06/29/2021 REFERRING MD:  Maylene Roes - TRH CHIEF COMPLAINT:  SOB, dyspnea   History of Present Illness:  86 year old man who presented to [facility] ED 8/28 for SOB and fever. Found to have multifocal PNA. PMHx significant for lung CA (in remission, s/p lobectomy), HTN, HLD, GERD, Gilbert's syndrome, anxiety, bipolar disorder.  Initially seen by PCP a few days prior to admission, placed on doxycycline without improvement; he was subsequently placed on azithromycin and prednisone and Decadron x 1 and albuterol were administered in the office. Despite these interventions, SOB and dyspnea persisted and patient presented to ED. CT Chest was completed demonstrating multifocal PNA and empiric antibiotics/Solumedrol were initiated.  PCCM consulted 8/29 in the setting of worsening respiratory distress.  Pertinent Medical History:   Past Medical History:  Diagnosis Date   Allergy    Arthritis    psoriatic arthritis, ? , treated /w mmethotrexate    Bipolar 1 disorder ([place])    BPH (benign prostatic hyperplasia)    Cataract 2010   Bilateral   Chest pain    Depression    Deviated septum    TO THE LEFT   ED (erectile dysfunction)    Essential hypertension 06/28/2021   Finger injury    mallet finger-- 02/13/2013, cast in place, followed by dr. Fredna Dow   GERD (gastroesophageal reflux disease)    Gilbert's syndrome    History of radiation therapy 07/24/13-08/27/13   50Gy/25 fx chest   Hyperlipidemia    Hypogonadism male    Laryngopharyngeal reflux    lung ca dx'd 12/2012   rul   Lung mass 02/07/13   RIGHT UPPER LOBE   Morton's neuroma    LEFT FOOT   Neuromuscular disorder ([place])    peripheral neuropathy   Shortness of breath    voice breaks, ? related to reflux, although told by Dr. Ardis Hughs, S.- early 65 yr. old   [place] [facility] Events: Including procedures, antibiotic start and stop  dates in addition to other pertinent events   8/28 - Admitted to [facility] for multifocal PNA 8/29 - PCCM consulted for respiratory distress. Intubated with 7.5 ETT. Persistent air leak, ETT exchanged for 8.0; persistent leak, bronch completed with mild secretions.  Interim History / Subjective:  PCCM consulted  Objective:  Blood pressure (!) 193/115, pulse (!) 107, temperature (!) 96.7 F (35.9 C), temperature source Axillary, resp. rate (!) 35, height 5\' 10"  (1.778 m), SpO2 (!) 87 %.    FiO2 (%):  [36 %-100 %] 36 %   Intake/Output Summary (Last 24 hours) at 06/29/2021 1310 Last data filed at 06/29/2021 1443 Gross per 24 hour  Intake 2400 ml  Output 1600 ml  Net 800 ml   There were no vitals filed for this visit.  Physical Examination: General: Chronically ill-appearing elderly man in NAD. HEENT: [place]/AT, anicteric sclera, PERRL, moist mucous membranes. Neuro: Lethargic. Responds to verbal stimuli. Following commands intermittently. Moves all 4 extremities spontaneously.  CV: RRR, no m/g/r. PULM: Breathing labored on NRB mask. Lung fields with scattered rhonchi, faint expiratory wheeze. GI: Soft, nontender, nondistended. Normoactive bowel sounds. Extremities: No LE edema noted. Skin: Warm/dry, no rashes.  Resolved Hospital Problem List:    Assessment & Plan:  Acute hypoxic respiratory failure in the setting of multifocal PNA Intubation 8/29 with air leak/delayed color change s/p ETT 7.5 tube placement. Exhcanged for 8.0 tube with persistent issue. Patient was then  bronched and mild secretions noted/cleared with suboptimal ventilation continuing. - Patient intubated 8/29 (see Procedure note) - Continue full vent support (4-8cc/kg IBW) - Wean FiO2 for O2 sat > 90% - Daily WUA/SBT - VAP bundle - BDs as ordered - Pulmonary hygiene - PAD protocol for sedation: Fentanyl for goal RASS 0 to -1 - F/u repeat post-intubation CXR - Consider dedicated CT Neck vs. repeat CT Chest if ongoing  c/f TE fistula  Multifocal PNA Admitted 8/28 with SOB, dyspnea. CT Chest demonstrating multifocal PNA, negative for PE. - Continue cefepime - Pulmonary toilet as above - Daily CXR - Trend WBC, fever curve  History of lung CA s/p RUL lobectomy and radiation - Surveillance monitoring per protocol  Hypertension Hyperlipidemia - Continue home antihypertensives  Bipolar disorder Anxiety - PAD as above while intubated for goal RASS 0 to -1 - Resume home anxiolytics as clinically appropriate  Best Practice: (right click and "Reselect all SmartList Selections" daily)   Diet/type: NPO DVT prophylaxis: LMWH GI prophylaxis: PPI Lines: N/A Foley:  N/A Code Status:  full code Last date of multidisciplinary goals of care discussion [8/29 - Patient/wife verify that he would like to remain a Full Code prior to intubation]  Labs:  CBC: Recent Labs  Lab 06/28/21 0100 06/29/21 0304  WBC 15.1* 12.4*  NEUTROABS 12.3*  --   HGB 14.5 13.1  HCT 45.4 42.5  MCV 89.5 90.6  PLT 207 578   Basic Metabolic Panel: Recent Labs  Lab 06/28/21 0100 06/29/21 0304  NA 136 140  K 3.6 4.3  CL 103 108  CO2 22 26  GLUCOSE 173* 115*  BUN 21 16  CREATININE 0.92 0.93  CALCIUM 9.1 8.5*   GFR: CrCl cannot be calculated (Unknown ideal weight.). Recent Labs  Lab 06/28/21 0100 06/28/21 0420 06/28/21 0630 06/28/21 0844 06/29/21 0304 06/29/21 0936  WBC 15.1*  --   --   --  12.4*  --   LATICACIDVEN 2.2* 4.7* 4.8* 2.6*  --  1.3   Liver Function Tests: Recent Labs  Lab 06/28/21 0100 06/29/21 0304  AST 28 22  ALT 20 18  ALKPHOS 47 38  BILITOT 1.1 0.7  PROT 6.8 5.9*  ALBUMIN 3.9 3.2*   No results for input(s): LIPASE, AMYLASE in the last 168 hours. No results for input(s): AMMONIA in the last 168 hours.  ABG:    Component Value Date/Time   PHART 7.373 06/28/2021 0637   PCO2ART 36.9 06/28/2021 0637   PO2ART 137 (H) 06/28/2021 0637   HCO3 21.0 06/28/2021 0637   TCO2 30 02/22/2013  0421   ACIDBASEDEF 3.2 (H) 06/28/2021 0637   O2SAT 98.9 06/28/2021 0637   Coagulation Profile: Recent Labs  Lab 06/28/21 0100  INR 1.1   Cardiac Enzymes: No results for input(s): CKTOTAL, CKMB, CKMBINDEX, TROPONINI in the last 168 hours.  HbA1C: No results found for: HGBA1C  CBG: No results for input(s): GLUCAP in the last 168 hours.  Review of Systems:   Review of systems completed with pertinent positives/negatives outlined in above HPI.  Past Medical History:  He,  has a past medical history of Allergy, Arthritis, Bipolar 1 disorder ([place]), BPH (benign prostatic hyperplasia), Cataract (2010), Chest pain, Depression, Deviated septum, ED (erectile dysfunction), Essential hypertension (06/28/2021), Finger injury, GERD (gastroesophageal reflux disease), Gilbert's syndrome, History of radiation therapy (07/24/13-08/27/13), Hyperlipidemia, Hypogonadism male, Laryngopharyngeal reflux, lung ca (dx'd 12/2012), Lung mass (02/07/13), Morton's neuroma, Neuromuscular disorder ([place]), and Shortness of breath.   Surgical History:   Past Surgical  History:  Procedure Laterality Date   CYSTOSCOPY N/A 02/21/2013   Procedure: CYSTOSCOPY FLEXIBLE with insertion of foley catheter;  Surgeon: Fredricka Bonine, MD;  Location: [facility];  Service: Urology;  Laterality: N/A;   EYE SURGERY     cataracts removed fr. both eyes, IOL in place    KNEE ARTHROSCOPY Right 01/2007   PILONIDAL CYST EXCISION  1960's   PROSTATE SURGERY  1996   TURP   VIDEO ASSISTED THORACOSCOPY (VATS)/WEDGE RESECTION Right 02/21/2013   Procedure: Right VIDEO ASSISTED THORACOSCOPY ,Thoracotomy with right upper lobectomy, node sampling ;  Surgeon: Grace Isaac, MD;  Location: [facility];  Service: Thoracic;  Laterality: Right;   VIDEO BRONCHOSCOPY N/A 02/21/2013   Procedure: VIDEO BRONCHOSCOPY;  Surgeon: Grace Isaac, MD;  Location: [facility];  Service: Thoracic;  Laterality: N/A;    Social History:   reports that he quit smoking about  42 years ago. His smoking use included cigarettes. He has never used smokeless tobacco. He reports current alcohol use. He reports that he does not use drugs.   Family History:  His family history includes Cancer in his maternal aunt and mother; Diabetes in his mother and son; Heart disease in his father.   Allergies: Allergies  Allergen Reactions   Codeine Anaphylaxis   Hydrocodone-Acetaminophen     Other reaction(s): Other (See Comments) Reactions unknown   Omeprazole Magnesium     Other reaction(s): Other (See Comments) Reaction Unknown   Tramadol     Other reaction(s): Other (See Comments) Reactions Unknown   Decadron [Dexamethasone]     Pt says it is contraindication due to the lithium he is taking   Fenofibrate     Other reaction(s): Other (See Comments) Reactions Unknown   Neomycin-Bacitracin Zn-Polymyx     Other reaction(s): Other (See Comments) Reactions Unknown   Other     Pt. Remarks that all pain med. Make him nauseated    Penicillin G     Other reaction(s): Other (See Comments) Reactions Unknown   Propoxyphene     Other reaction(s): Other (See Comments) Reaction Unknown   Statins     Lipitor, Crestor, Zocor all caused muscle aches   Ubidecarenone     Other reaction(s): Other (See Comments) Reactions Unknown   Zetia [Ezetimibe]     Muscle aches   Zoloft [Sertraline Hcl] Other (See Comments)    REACTION:  unknown    Home Medications: Prior to Admission medications   Medication Sig Start Date End Date Taking? Authorizing Provider  albuterol (VENTOLIN HFA) 108 (90 Base) MCG/ACT inhaler Inhale 2 puffs into the lungs every 4 (four) hours as needed for shortness of breath. 06/22/21  Yes [provider]  aspirin EC 81 MG tablet Take 81 mg by mouth every morning.    Yes [provider]  Calcium Carbonate-Vitamin D 600-400 MG-UNIT tablet Take 1 tablet by mouth every morning.   Yes [provider]  Cholecalciferol (VITAMIN D-3) 1000 UNITS  CAPS Take 1,000 Units by mouth every morning.    Yes [provider]  clobetasol cream (TEMOVATE) 9.52 % Apply 1 application topically daily. Reported on 01/27/2016   Yes [provider]  clonazePAM (KLONOPIN) 0.5 MG tablet Take 0.5 mg by mouth at bedtime.   Yes [provider]  cyanocobalamin 1000 MCG tablet Take 1,000 mcg by mouth every morning.    Yes [provider]  esomeprazole (NEXIUM) 40 MG capsule Take 40 mg by mouth daily before breakfast.   Yes [provider]  finasteride (PROSCAR) 5 MG tablet Take 5 mg by mouth daily before breakfast.    Yes [provider]  gabapentin (NEURONTIN) 300 MG capsule Take 1,200 mg by mouth at bedtime. He takes two capsules every morning and three capsules at bedtime.   Yes [provider]  lithium carbonate (LITHOBID) 300 MG CR tablet Take 600 mg by mouth at bedtime.    Yes [provider]  Multiple Vitamin (MULTIVITAMIN WITH MINERALS) TABS tablet Take 1 tablet by mouth every morning.   Yes [provider]  OVER THE COUNTER MEDICATION Take 1 tablet by mouth 2 (two) times daily. Glucosamine Chondroitin 1500mg /1200mg    Yes [provider]  Probiotic Product (PROBIOTIC DAILY PO) Take 1 each by mouth daily.   Yes [provider]  propranolol (INDERAL) 10 MG tablet Take 10-20 mg by mouth daily as needed (For extremely stressful circumstances.).    Yes [provider]  REPATHA SURECLICK 254 MG/ML SOAJ Inject 140 mg as directed every 14 (fourteen) days. 05/28/16  Yes [provider]  tamsulosin (FLOMAX) 0.4 MG CAPS capsule Take 0.4 mg by mouth daily. 12/19/14  Yes [provider]  terbinafine (LAMISIL) 250 MG tablet Take 250 mg by mouth daily. 01/25/20  Yes [provider]  testosterone cypionate (DEPOTESTOTERONE CYPIONATE) 200 MG/ML injection Inject 0.5 mLs into the muscle every 21 ( twenty-one) days. Now 300mg /ml 11/05/14  Yes [provider]  vardenafil (LEVITRA) 5 MG tablet Take 5 mg by mouth daily as needed for erectile dysfunction.    Yes [provider]    Critical care time: 40 minutes   Lestine Mount, PA-C [facility] 06/29/21 1:10 PM  Please see [email or web address] for pager details.  From 7A-7P if no response, please call [phone] After hours, please call ELink [phone]

## 2021-06-29 NOTE — TOC Initial Note (Signed)
Transition of Care [facility]) - Initial/Assessment Note    Patient Details  Name: Alvin Hardy MRN: [id number] Date of Birth: 29-Nov-1934  Transition of Care [facility] CM/SW Contact:    Leeroy Cha, RN Phone Number: 06/29/2021, 8:52 AM  Clinical Narrative:                 86 y.o. male with medical history significant for lung cancer status post lobectomy in remission, GERD, Gilbert's syndrome who presents to the emergency department by EMS from the independent living section of [facility] with SOB and fever.  Patient reports that he has been short of breath all week and is progressively gotten worse.  He is not sure what set off his shortness of breath.  States he was seen by his PCP a few days ago and was started on doxycycline did not improve and went back to his doctor and then was placed on a Z-Pak and prednisone. He was given dose of Decadron in the office and given in albuterol inhaler and cough medication.  Despite these interventions he continued to get worse and has increased shortness of breath. He was found to have multifocal pneumonia on CT of his chest. COVID-19 negative.  Influenza A and B are negative.  Started on empiric antibiotics with vancomycin and cefepime.   On examination, he remains on 3 L nasal cannula O2 with mild conversational dyspnea.  He has diffuse wheezes bilaterally.  He does not appear to be confused, tells me that he lives in an apartment with his wife and cares for her, she has myasthenia gravis.  He is worried about giving her any sort of respiratory infection.  They have children who live out of state.  He admits to nonproductive cough.   Addendum: Patient re-evaluated once he was transferred to stepdown unit. He reports that he did not tolerate BiPAP well due to claustrophobia. He is much more comfortable and less anxious after receiving ativan.    Acute hypoxemic respiratory failure with multifocal pneumonia -Remains on nasal cannula O2, may require BiPAP  for respiratory support for dyspnea   Sepsis secondary to multifocal pneumonia, sepsis present on admission -Failed outpatient antibiotic treatment -Continue cefepime -MRSA PCR negative, stop IV Vanco -Blood cultures pending -Due to diffuse wheezing, added steroids as well as breathing treatments  Hypertension -Continue Cozaar  History of non-small cell lung cancer -Status post right upper lobectomy, chemo and radiation -Followed by Dr. Julien Nordmann on yearly basis   BPH -Continue proscar, flomax   TOC PLAN OF CARE: return to independent living at [facility]  Expected Discharge Plan: Home/Self Care Barriers to Discharge: Continued Medical Work up   Patient Goals and CMS Choice Patient states their goals for this hospitalization and ongoing recovery are:: to go back to [place] CMS Medicare.gov Compare Post Acute Care list provided to:: Patient Choice offered to / list presented to : Patient  Expected Discharge Plan and Services Expected Discharge Plan: Home/Self Care   Discharge Planning Services: CM Consult   Living arrangements for the past 2 months: Apartment                                      Prior Living Arrangements/Services Living arrangements for the past 2 months: Apartment Lives with:: Facility Resident ([facility]) Patient language and need for interpreter reviewed:: Yes Do you feel safe going back to the place where you live?: Yes  Criminal Activity/Legal Involvement Pertinent to Current Situation/Hospitalization: No - Comment as needed  Activities of Daily Living      Permission Sought/Granted                  Emotional Assessment Appearance:: Appears stated age     Orientation: : Oriented to Self, Oriented to Place, Oriented to  Time, Oriented to Situation Alcohol / Substance Use: Not Applicable Psych Involvement: No (comment)  Admission diagnosis:  Multifocal pneumonia [J18.9] Sepsis with acute organ  dysfunction without septic shock, due to unspecified organism, unspecified type ([place]) [A41.9, R65.20] Patient Active Problem List   Diagnosis Date Noted   Multifocal pneumonia 06/28/2021   Sepsis ([place]) 06/28/2021   History of lung cancer 06/28/2021   Essential hypertension 06/28/2021   Prolonged QT interval 06/28/2021   Osteoarthritis of left glenohumeral joint 12/22/2018   Aortic atherosclerosis ([place]) 07/10/2018   Bursitis of elbow 07/10/2018   Diarrhea 07/10/2018   DJD (degenerative joint disease) 07/10/2018   Finger pain 07/10/2018   Foot pain 07/10/2018   GERD (gastroesophageal reflux disease) 07/10/2018   Hoarseness 07/10/2018   Impaired fasting glucose 07/10/2018   Low back pain 07/10/2018   Neck mass 07/10/2018   Obesity 07/10/2018   Other psoriasis 07/10/2018   Postnasal drip 07/10/2018   Prostatic hypertrophy 07/10/2018   Rectal bleeding 07/10/2018   Sinusitis 07/10/2018   Sore throat 07/10/2018   UTI (urinary tract infection) 07/10/2018   Dyspnea 02/04/2014   Hyperlipidemia 01/25/2014   Chest pain 01/25/2014   Claudication ([place]) 01/25/2014   BPH (benign prostatic hyperplasia)    Hypogonadism male    Bipolar 1 disorder ([place])    ED (erectile dysfunction)    Neuromuscular disorder ([place])    Laryngopharyngeal reflux    Gilbert's syndrome    Morton's neuroma    Lung cancer, Right Upper Lobe    PCP:  Crist Infante, MD Pharmacy:   [facility] [phone] [address], [address] AT [facility] OF [address] [place] [address] Phone: [phone] Fax: [phone]  [facility] [place], [place] AT Portal to Registered [employer] Sites [place] [address] Phone: [phone] Fax: [id number] - [place], [place] - [address] AT [address] OF [address] [place] [address] Phone: [phone] Fax:  [phone]  [facility] [place], [place] [address] [place] [address] Phone: [phone] Fax: [phone]     Social Determinants of Health (SDOH) Interventions    Readmission Risk Interventions No flowsheet data found.

## 2021-06-29 NOTE — Progress Notes (Signed)
PCCM Interval Progress Note  Spoke with patient's wife, Mekhai Venuto, at [phone] regarding patient's respiratory status.  Explained that as the Pulmonary/ICU team, we were consulted for Mr. Bleecker's worsening breathing/respiratory status. Initially admitted with multifocal PNA, Mr. Hofman has been maintaining with supplemental O2, but this afternoon his respiratory status worsened with increased tachypnea, marginal O2 sats and accessory muscle use.   He appears to be fatiguing. Explained need for possible intubation to Ms. Plant, who was in agreement that this would be the next best course of action. Of note, Dr. Maylene Roes [facility]) spoke with patient this morning when mental status was clear and patient did endorse that he would like to remain a full code.  Plan to proceed with intubation with wife, Dakarri Kessinger, in agreement.  Lestine Mount, PA-C [facility] 06/29/21 1:42 PM  Please see [email or web address] for pager details.  From 7A-7P if no response, please call [phone] After hours, please call ELink [phone].

## 2021-06-30 ENCOUNTER — Inpatient Hospital Stay (HOSPITAL_COMMUNITY): Payer: Medicare Other

## 2021-06-30 ENCOUNTER — Other Ambulatory Visit: Payer: Self-pay

## 2021-06-30 DIAGNOSIS — I10 Essential (primary) hypertension: Secondary | ICD-10-CM

## 2021-06-30 DIAGNOSIS — R0603 Acute respiratory distress: Secondary | ICD-10-CM

## 2021-06-30 DIAGNOSIS — J9601 Acute respiratory failure with hypoxia: Secondary | ICD-10-CM | POA: Diagnosis not present

## 2021-06-30 DIAGNOSIS — J189 Pneumonia, unspecified organism: Secondary | ICD-10-CM | POA: Diagnosis not present

## 2021-06-30 DIAGNOSIS — Z85118 Personal history of other malignant neoplasm of bronchus and lung: Secondary | ICD-10-CM

## 2021-06-30 LAB — CBC
HCT: 47.5 % (ref 39.0–52.0)
Hemoglobin: 14.2 g/dL (ref 13.0–17.0)
MCH: 28.2 pg (ref 26.0–34.0)
MCHC: 29.9 g/dL — ABNORMAL LOW (ref 30.0–36.0)
MCV: 94.2 fL (ref 80.0–100.0)
Platelets: 221 10*3/uL (ref 150–400)
RBC: 5.04 MIL/uL (ref 4.22–5.81)
RDW: 14.2 % (ref 11.5–15.5)
WBC: 13.4 10*3/uL — ABNORMAL HIGH (ref 4.0–10.5)
nRBC: 0 % (ref 0.0–0.2)

## 2021-06-30 LAB — PHOSPHORUS: Phosphorus: 3.3 mg/dL (ref 2.5–4.6)

## 2021-06-30 LAB — BASIC METABOLIC PANEL
Anion gap: 6 (ref 5–15)
BUN: 28 mg/dL — ABNORMAL HIGH (ref 8–23)
CO2: 26 mmol/L (ref 22–32)
Calcium: 8.6 mg/dL — ABNORMAL LOW (ref 8.9–10.3)
Chloride: 108 mmol/L (ref 98–111)
Creatinine, Ser: 1.04 mg/dL (ref 0.61–1.24)
GFR, Estimated: >60 mL/min (ref >60)
Glucose, Bld: 97 mg/dL (ref 70–99)
Potassium: 4.9 mmol/L (ref 3.5–5.1)
Sodium: 140 mmol/L (ref 135–145)

## 2021-06-30 LAB — BLOOD GAS, ARTERIAL
Acid-base deficit: 0.7 mmol/L (ref 0.0–2.0)
Bicarbonate: 23.7 mmol/L (ref 20.0–28.0)
FIO2: 40
O2 Saturation: 97.5 %
Patient temperature: 98.3
pCO2 arterial: 40.1 mmHg (ref 32.0–48.0)
pH, Arterial: 7.389 (ref 7.350–7.450)
pO2, Arterial: 96.4 mmHg (ref 83.0–108.0)

## 2021-06-30 LAB — ECHOCARDIOGRAM COMPLETE
Area-P 1/2: 3.85 cm2
Calc EF: 51.2 %
Height: 70 in
S' Lateral: 4.1 cm
Single Plane A2C EF: 39.7 %
Single Plane A4C EF: 58.3 %
Weight: 3065.28 oz

## 2021-06-30 LAB — STREP PNEUMONIAE URINARY ANTIGEN: Strep Pneumo Urinary Antigen: NEGATIVE

## 2021-06-30 LAB — MAGNESIUM: Magnesium: 2.5 mg/dL — ABNORMAL HIGH (ref 1.7–2.4)

## 2021-06-30 MED ORDER — SODIUM CHLORIDE 0.9 % IV SOLN
INTRAVENOUS | Status: DC | PRN
Start: 2021-06-30 — End: 2021-07-08
  Administered 2021-06-30: 500 mL via INTRAVENOUS

## 2021-06-30 MED ORDER — HYDRALAZINE HCL 20 MG/ML IJ SOLN
10.0000 mg | INTRAMUSCULAR | Status: AC | PRN
  Administered 2021-06-30 – 2021-07-02 (×2): 20 mg via INTRAVENOUS
  Administered 2021-07-02: 10 mg via INTRAVENOUS
  Filled 2021-06-30 (×3): qty 1

## 2021-06-30 MED ORDER — ACETAMINOPHEN 650 MG RE SUPP: 650.0000 mg | Freq: Four times a day (QID) | RECTAL | Status: DC | PRN

## 2021-06-30 MED ORDER — PANTOPRAZOLE SODIUM 40 MG IV SOLR
40.0000 mg | INTRAVENOUS | Status: DC
  Administered 2021-06-30 – 2021-07-04 (×5): 40 mg via INTRAVENOUS
  Filled 2021-06-30 (×6): qty 40

## 2021-06-30 MED ORDER — ACETAMINOPHEN 325 MG PO TABS: 650.0000 mg | ORAL_TABLET | Freq: Four times a day (QID) | ORAL | Status: DC | PRN

## 2021-06-30 MED ORDER — SENNOSIDES-DOCUSATE SODIUM 8.6-50 MG PO TABS: 1.0000 | ORAL_TABLET | Freq: Every evening | ORAL | Status: DC | PRN

## 2021-06-30 MED ORDER — DEXMEDETOMIDINE HCL IN NACL 200 MCG/50ML IV SOLN
0.4000 ug/kg/h | INTRAVENOUS | Status: DC
Start: 2021-06-30 — End: 2021-07-01
  Administered 2021-06-30: 0.4 ug/kg/h via INTRAVENOUS
  Administered 2021-06-30: 0.7 ug/kg/h via INTRAVENOUS
  Administered 2021-06-30 (×2): 0.6 ug/kg/h via INTRAVENOUS
  Administered 2021-06-30: 0.5 ug/kg/h via INTRAVENOUS
  Administered 2021-07-01: 0.7 ug/kg/h via INTRAVENOUS
  Filled 2021-06-30 (×4): qty 50
  Filled 2021-06-30: qty 100
  Filled 2021-06-30: qty 50

## 2021-06-30 MED ORDER — HYDRALAZINE HCL 25 MG PO TABS
25.0000 mg | ORAL_TABLET | Freq: Once | ORAL | Status: AC
  Administered 2021-06-30: 25 mg
  Filled 2021-06-30: qty 1

## 2021-06-30 MED ORDER — LITHIUM CARBONATE 300 MG PO CAPS
300.0000 mg | ORAL_CAPSULE | Freq: Two times a day (BID) | ORAL | Status: DC
  Administered 2021-06-30 – 2021-07-08 (×16): 300 mg via ORAL
  Filled 2021-06-30 (×19): qty 1

## 2021-06-30 NOTE — Progress Notes (Signed)
NAME:  Alvin Hardy, MRN:  [id number], DOB:  04-27-1935, LOS: 2 ADMISSION DATE:  06/28/2021 CONSULTATION DATE:  06/29/2021 REFERRING MD:  Maylene Roes - [facility] CHIEF COMPLAINT:  SOB, dyspnea   History of Present Illness:  86 year old man who presented to [facility] ED 8/28 for SOB and fever. Found to have multifocal PNA. PMHx significant for lung CA (in remission, s/p lobectomy), HTN, HLD, GERD, Gilbert's syndrome, anxiety, bipolar disorder.  Initially seen by PCP a few days prior to admission, placed on doxycycline without improvement; he was subsequently placed on azithromycin and prednisone and Decadron x 1 and albuterol were administered in the office. Despite these interventions, SOB and dyspnea persisted and patient presented to ED. CT Chest was completed demonstrating multifocal PNA and empiric antibiotics/Solumedrol were initiated.  PCCM consulted 8/29 in the setting of worsening respiratory distress and patient required intubation and mechanical ventilation  Pertinent Medical History:   Past Medical History:  Diagnosis Date   Allergy    Arthritis    psoriatic arthritis, ? , treated /w mmethotrexate    Bipolar 1 disorder (HCC)    BPH (benign prostatic hyperplasia)    Cataract 2010   Bilateral   Chest pain    Depression    Deviated septum    TO THE LEFT   ED (erectile dysfunction)    Essential hypertension 06/28/2021   Finger injury    mallet finger-- 02/13/2013, cast in place, followed by dr. Fredna Dow   GERD (gastroesophageal reflux disease)    Gilbert's syndrome    History of radiation therapy 07/24/13-08/27/13   50Gy/25 fx chest   Hyperlipidemia    Hypogonadism male    Laryngopharyngeal reflux    lung ca dx'd 12/2012   rul   Lung mass 02/07/13   RIGHT UPPER LOBE   Morton's neuroma    LEFT FOOT   Neuromuscular disorder ([place])    peripheral neuropathy   Shortness of breath    voice breaks, ? related to reflux, although told by Dr. Ardis Hughs, S.- early 49 yr. old   [facility]  Events: Including procedures, antibiotic start and stop dates in addition to other pertinent events   8/28 - Admitted to [facility] for multifocal PNA 8/29 - PCCM consulted for respiratory distress. Intubated with 7.5 ETT. Persistent air leak, ETT exchanged for 8.0; persistent leak, bronch completed with mild secretions. 8/30 No overnight events, on vent and antibiotics, not requiring pressors  Interim History / Subjective:  Remains on full vent support, mental status and respiratory distress preclude successful SBT today  Objective:  Blood pressure (!) 151/77, pulse (!) 115, temperature 98.8 F (37.1 C), temperature source Oral, resp. rate (!) 0, height 5\' 10"  (1.778 m), weight 86.9 kg, SpO2 93 %.    Vent Mode: PRVC FiO2 (%):  [40 %-100 %] 40 % Set Rate:  [16 bmp-20 bmp] 20 bmp Vt Set:  [530 mL-580 mL] 580 mL PEEP:  [8 cmH20] 8 cmH20 Plateau Pressure:  [16 cmH20-21 cmH20] 16 cmH20   Intake/Output Summary (Last 24 hours) at 06/30/2021 8527 Last data filed at 06/30/2021 0630 Gross per 24 hour  Intake 481.82 ml  Output 1250 ml  Net -768.18 ml    Filed Weights   06/30/21 0800  Weight: 86.9 kg    General: Elderly male, intubated and sedated HEENT: MM pink/moist, ETT in place Neuro: Opens eyes to voice, spontaneously moving extremities but not to command, agitated with wakening while on fentanyl gtt. CV: s1s2 tachycardic, regular, no m/r/g PULM: Expiratory wheezing bilaterally  with rhonchi left lower lobe GI: soft, somewhat distended bsx4 active  Extremities: warm/dry, no edema  Skin: no rashes or lesions   Resolved Hospital Problem List:    Assessment & Plan:     Acute hypoxic respiratory failure in the setting of multifocal PNA Intubation 8/29 with air leak/delayed color change s/p ETT 7.5 tube placement. Exhcanged for 8.0 tube with persistent issue. Patient was then bronched and mild secretions  P: -Blood gas and oxygenation/ventilation much improved this morning -Mental  status and vent dyssynchrony preclude SBT/SAT at this time -Precedex and attempt to wean fentanyl -Check respiratory cultures and urine Legionella/strep pneumo -Cultures negative thus far -Continue vanc/cefepime for now, likely de-escalate antibiotics as soon as micro culture available -Stable CXR this a.m. -Maintain full vent support with SAT/SBT as tolerated -titrate Vent setting to maintain SpO2 greater than or equal to 90%. -HOB elevated 30 degrees. -Plateau pressures less than 30 cm H20.  -Follow chest x-ray, ABG prn.   -Bronchial hygiene and RT/bronchodilator protocol. - Consider dedicated CT Neck vs. repeat CT Chest if ongoing c/f TE fistula - Pulmonary toilet  - Trend WBC, fever curve -Check echocardiogram, last in 2016 with grade 1 diastolic dysfunction  History of lung CA s/p RUL lobectomy and radiation - Surveillance monitoring per protocol  Hypertension Hyperlipidemia - Continue home antihypertensives  Bipolar disorder Anxiety - PAD as above while intubated for goal RASS 0 to -1 - Resume home anxiolytics as clinically appropriate -Continue home lithium  Best Practice: (right click and "Reselect all SmartList Selections" daily)   Diet/type: NPO DVT prophylaxis: LMWH GI prophylaxis: PPI Lines: N/A Foley:  N/A Code Status:  full code Last date of multidisciplinary goals of care discussion [8/29 - Patient/wife verify that he would like to remain a Full Code prior to intubation]  Labs:  CBC: Recent Labs  Lab 06/28/21 0100 06/29/21 0304 06/30/21 0249  WBC 15.1* 12.4* 13.4*  NEUTROABS 12.3*  --   --   HGB 14.5 13.1 14.2  HCT 45.4 42.5 47.5  MCV 89.5 90.6 94.2  PLT 207 194 829    Basic Metabolic Panel: Recent Labs  Lab 06/28/21 0100 06/29/21 0304 06/30/21 0249  NA 136 140 140  K 3.6 4.3 4.9  CL 103 108 108  CO2 22 26 26   GLUCOSE 173* 115* 97  BUN 21 16 28*  CREATININE 0.92 0.93 1.04  CALCIUM 9.1 8.5* 8.6*  MG  --   --  2.5*  PHOS  --   --  3.3     GFR: Estimated Creatinine Clearance: 54.6 mL/min (by C-G formula based on SCr of 1.04 mg/dL). Recent Labs  Lab 06/28/21 0100 06/28/21 0420 06/28/21 0630 06/28/21 0844 06/29/21 0304 06/29/21 0936 06/30/21 0249  WBC 15.1*  --   --   --  12.4*  --  13.4*  LATICACIDVEN 2.2* 4.7* 4.8* 2.6*  --  1.3  --     Liver Function Tests: Recent Labs  Lab 06/28/21 0100 06/29/21 0304  AST 28 22  ALT 20 18  ALKPHOS 47 38  BILITOT 1.1 0.7  PROT 6.8 5.9*  ALBUMIN 3.9 3.2*    No results for input(s): LIPASE, AMYLASE in the last 168 hours. No results for input(s): AMMONIA in the last 168 hours.  ABG:    Component Value Date/Time   PHART 7.389 06/30/2021 0338   PCO2ART 40.1 06/30/2021 0338   PO2ART 96.4 06/30/2021 0338   HCO3 23.7 06/30/2021 0338   TCO2 30 02/22/2013 0421  ACIDBASEDEF 0.7 06/30/2021 0338   O2SAT 97.5 06/30/2021 0338   Coagulation Profile: Recent Labs  Lab 06/28/21 0100  INR 1.1    Cardiac Enzymes: No results for input(s): CKTOTAL, CKMB, CKMBINDEX, TROPONINI in the last 168 hours.  HbA1C: No results found for: HGBA1C  CBG: No results for input(s): GLUCAP in the last 168 hours.  Review of Systems:   Unable to obtain secondary to intubation and sedation  Past Medical History:  He,  has a past medical history of Allergy, Arthritis, Bipolar 1 disorder ([place]), BPH (benign prostatic hyperplasia), Cataract (2010), Chest pain, Depression, Deviated septum, ED (erectile dysfunction), Essential hypertension (06/28/2021), Finger injury, GERD (gastroesophageal reflux disease), Gilbert's syndrome, History of radiation therapy (07/24/13-08/27/13), Hyperlipidemia, Hypogonadism male, Laryngopharyngeal reflux, lung ca (dx'd 12/2012), Lung mass (02/07/13), Morton's neuroma, Neuromuscular disorder ([place]), and Shortness of breath.   Surgical History:   Past Surgical History:  Procedure Laterality Date   CYSTOSCOPY N/A 02/21/2013   Procedure: CYSTOSCOPY FLEXIBLE with insertion  of foley catheter;  Surgeon: Fredricka Bonine, MD;  Location: [place];  Service: Urology;  Laterality: N/A;   EYE SURGERY     cataracts removed fr. both eyes, IOL in place    KNEE ARTHROSCOPY Right 01/2007   PILONIDAL CYST EXCISION  1960's   PROSTATE SURGERY  1996   TURP   VIDEO ASSISTED THORACOSCOPY (VATS)/WEDGE RESECTION Right 02/21/2013   Procedure: Right VIDEO ASSISTED THORACOSCOPY ,Thoracotomy with right upper lobectomy, node sampling ;  Surgeon: Grace Isaac, MD;  Location: [place];  Service: Thoracic;  Laterality: Right;   VIDEO BRONCHOSCOPY N/A 02/21/2013   Procedure: VIDEO BRONCHOSCOPY;  Surgeon: Grace Isaac, MD;  Location: [place];  Service: Thoracic;  Laterality: N/A;    Social History:   reports that he quit smoking about 42 years ago. His smoking use included cigarettes. He has never used smokeless tobacco. He reports current alcohol use. He reports that he does not use drugs.   Family History:  His family history includes Cancer in his maternal aunt and mother; Diabetes in his mother and son; Heart disease in his father.   Allergies: Allergies  Allergen Reactions   Codeine Anaphylaxis   Hydrocodone-Acetaminophen     Other reaction(s): Other (See Comments) Reactions unknown   Omeprazole Magnesium     Other reaction(s): Other (See Comments) Reaction Unknown   Tramadol     Other reaction(s): Other (See Comments) Reactions Unknown   Decadron [Dexamethasone]     Pt says it is contraindication due to the lithium he is taking   Fenofibrate     Other reaction(s): Other (See Comments) Reactions Unknown   Neomycin-Bacitracin Zn-Polymyx     Other reaction(s): Other (See Comments) Reactions Unknown   Other     Pt. Remarks that all pain med. Make him nauseated    Penicillin G     Other reaction(s): Other (See Comments) Reactions Unknown   Propoxyphene     Other reaction(s): Other (See Comments) Reaction Unknown   Statins     Lipitor, Crestor, Zocor all caused  muscle aches   Ubidecarenone     Other reaction(s): Other (See Comments) Reactions Unknown   Zetia [Ezetimibe]     Muscle aches   Zoloft [Sertraline Hcl] Other (See Comments)    REACTION:  unknown    Home Medications: Prior to Admission medications   Medication Sig Start Date End Date Taking? Authorizing Provider  albuterol (VENTOLIN HFA) 108 (90 Base) MCG/ACT inhaler Inhale 2 puffs into the lungs every  4 (four) hours as needed for shortness of breath. 06/22/21  Yes [provider]  aspirin EC 81 MG tablet Take 81 mg by mouth every morning.    Yes [provider]  Calcium Carbonate-Vitamin D 600-400 MG-UNIT tablet Take 1 tablet by mouth every morning.   Yes [provider]  Cholecalciferol (VITAMIN D-3) 1000 UNITS CAPS Take 1,000 Units by mouth every morning.    Yes [provider]  clobetasol cream (TEMOVATE) 2.53 % Apply 1 application topically daily. Reported on 01/27/2016   Yes [provider]  clonazePAM (KLONOPIN) 0.5 MG tablet Take 0.5 mg by mouth at bedtime.   Yes [provider]  cyanocobalamin 1000 MCG tablet Take 1,000 mcg by mouth every morning.    Yes [provider]  esomeprazole (NEXIUM) 40 MG capsule Take 40 mg by mouth daily before breakfast.   Yes [provider]  finasteride (PROSCAR) 5 MG tablet Take 5 mg by mouth daily before breakfast.    Yes [provider]  gabapentin (NEURONTIN) 300 MG capsule Take 1,200 mg by mouth at bedtime. He takes two capsules every morning and three capsules at bedtime.   Yes [provider]  lithium carbonate (LITHOBID) 300 MG CR tablet Take 600 mg by mouth at bedtime.    Yes [provider]  Multiple Vitamin (MULTIVITAMIN WITH MINERALS) TABS tablet Take 1 tablet by mouth every morning.   Yes [provider]  OVER THE COUNTER MEDICATION Take 1 tablet by mouth 2 (two) times daily. Glucosamine Chondroitin 1500mg /1200mg    Yes [provider]  Probiotic Product (PROBIOTIC DAILY PO) Take 1 each by mouth daily.   Yes [provider]  propranolol (INDERAL) 10 MG tablet Take 10-20 mg by mouth daily as needed (For extremely stressful circumstances.).    Yes [provider]  REPATHA SURECLICK 664 MG/ML SOAJ Inject 140 mg as directed every 14 (fourteen) days. 05/28/16  Yes [provider]  tamsulosin (FLOMAX) 0.4 MG CAPS capsule Take 0.4 mg by mouth daily. 12/19/14  Yes [provider]  terbinafine (LAMISIL) 250 MG tablet Take 250 mg by mouth daily. 01/25/20  Yes [provider]  testosterone cypionate (DEPOTESTOTERONE CYPIONATE) 200 MG/ML injection Inject 0.5 mLs into the muscle every 21 ( twenty-one) days. Now 300mg /ml 11/05/14  Yes [provider]  vardenafil (LEVITRA) 5 MG tablet Take 5 mg by mouth daily as needed for erectile dysfunction.    Yes [provider]    Critical care time: 35 minutes     CRITICAL CARE Performed by: Otilio Carpen Nayden Czajka   Total critical care time: 35 minutes  Critical care time was exclusive of separately billable procedures and treating other patients.  Critical care was necessary to treat or prevent imminent or life-threatening deterioration.  Critical care was time spent personally by me on the following activities: development of treatment plan with patient and/or surrogate as well as nursing, discussions with consultants, evaluation of patient's response to treatment, examination of patient, obtaining history from patient or surrogate, ordering and performing treatments and interventions, ordering and review of laboratory studies, ordering and review of radiographic studies, pulse oximetry and re-evaluation of patient's condition.   Otilio Carpen Renelda Kilian, PA-C [facility] See Amion for pager If no response to pager , please call [phone] until 7pm After 7:00 pm call Elink  [phone]

## 2021-06-30 NOTE — Progress Notes (Signed)
[place] Progress Note Patient Name: Alvin Hardy DOB: 11/10/1934 MRN: [id number]   Date of Service  06/30/2021  HPI/Events of Note  Patient with sub-optimal BP control.  eICU Interventions  PRN iv Labetalol discontinued due to heart rate on the low side, PRN Hydralazine ordered.        Kerry Kass Massimiliano Rohleder 06/30/2021, 11:00 PM

## 2021-06-30 NOTE — Progress Notes (Signed)
  Echocardiogram 2D Echocardiogram has been performed.  Alvin Hardy 06/30/2021, 3:13 PM

## 2021-06-30 NOTE — Plan of Care (Signed)
Pt's wife updated by phone and questions answered.    Otilio Carpen Keli Buehner, PA-C

## 2021-06-30 NOTE — Progress Notes (Signed)
Patient was placed in wean CPAP mode, he failed due to the fact I couldn't decrease PEEP below 8 and patient became increasingly anxious. He was placed back into [facility] mode and now patient is tolerating well resting. RT will continue to monitor

## 2021-06-30 NOTE — Progress Notes (Signed)
Initial Nutrition Assessment  INTERVENTION:   If pt to remain intubated for >24-48 hours,  Recommend TF initiation via OGT. -Vital AF 1.2 @ 65 ml/hr -Provides 1872 kcals, 117g protein and 1265 ml H2O.  NUTRITION DIAGNOSIS:   Increased nutrient needs related to acute illness as evidenced by estimated needs.  GOAL:   Patient will meet greater than or equal to 90% of their needs  MONITOR:   Vent status, Labs, Weight trends, I & O's  REASON FOR ASSESSMENT:   Ventilator    ASSESSMENT:   86 year old man who presented to [facility] ED 8/28 for SOB and fever. Found to have multifocal PNA. PMHx significant for lung CA (in remission, s/p lobectomy), HTN, HLD, GERD, Gilbert's syndrome, anxiety, bipolar disorder.     Initially seen by PCP a few days prior to admission, placed on doxycycline without improvement; he was subsequently placed on azithromycin and prednisone and Decadron x 1 and albuterol were administered in the office. Despite these interventions, SOB and dyspnea persisted and patient presented to ED.  8/28: admitted 8/29: intubated with OGT  Per CCM note, possible extubation tomorrow.  Patient is currently intubated on ventilator support MV: 10.8 L/min Temp (24hrs), Avg:98 F (36.7 C), Min:97.5 F (36.4 C), Max:98.8 F (37.1 C)  Per weight records, pt's weight has remained stable.  Medications: Colace, Miralax, Precedex, Fentanyl  Labs reviewed:  Elevated Mg   NUTRITION - FOCUSED PHYSICAL EXAM:  No depletions noted.  Diet Order:   Diet Order             Diet NPO time specified  Diet effective now                   EDUCATION NEEDS:   No education needs have been identified at this time  Skin:  Skin Assessment: Reviewed RN Assessment  Last BM:  PTA  Height:   Ht Readings from Last 1 Encounters:  06/29/21 5\' 10"  (1.778 m)    Weight:   Wt Readings from Last 1 Encounters:  06/30/21 86.9 kg    BMI:  Body mass index is 27.49 kg/m.  Estimated  Nutritional Needs:   Kcal:  1826  Protein:  120-130g  Fluid:  1.8L/day  Clayton Bibles, MS, RD, LDN Inpatient Clinical Dietitian Contact information available via Amion

## 2021-06-30 NOTE — Progress Notes (Signed)
[place] Progress Note Patient Name: WASEEM SUESS DOB: August 20, 1935 MRN: [id number]   Date of Service  06/30/2021  HPI/Events of Note  Patient with acute urinary retention, > 600 ml by bladder scan, and in-out catheterization unsuccessful.  eICU Interventions  Order entered for attempt at catheterization utilizing a Coude catheter.        Kerry Kass Kateline Kinkade 06/30/2021, 3:41 AM

## 2021-06-30 NOTE — Progress Notes (Signed)
Abg drawn on the following vent settings: PRVC mode, VT=531ml (103ml/kg), rr20, 40%, +8peep.  Results for GIANLUCCA, SZYMBORSKI (MRN [id number]) as of 06/30/2021 03:51  Ref. Range 06/30/2021 03:38  FIO2 Unknown 40.00  pH, Arterial Latest Ref Range: 7.350 - 7.450  7.389  pCO2 arterial Latest Ref Range: 32.0 - 48.0 mmHg 40.1  pO2, Arterial Latest Ref Range: 83.0 - 108.0 mmHg 96.4  Acid-base deficit Latest Ref Range: 0.0 - 2.0 mmol/L 0.7  Bicarbonate Latest Ref Range: 20.0 - 28.0 mmol/L 23.7  O2 Saturation Latest Units: % 97.5  Patient temperature Unknown 98.3  Allens test (pass/fail) Latest Ref Range: PASS  PASS

## 2021-07-01 ENCOUNTER — Inpatient Hospital Stay (HOSPITAL_COMMUNITY): Payer: Medicare Other

## 2021-07-01 DIAGNOSIS — Z85118 Personal history of other malignant neoplasm of bronchus and lung: Secondary | ICD-10-CM | POA: Diagnosis not present

## 2021-07-01 DIAGNOSIS — J9601 Acute respiratory failure with hypoxia: Secondary | ICD-10-CM | POA: Diagnosis not present

## 2021-07-01 DIAGNOSIS — J189 Pneumonia, unspecified organism: Secondary | ICD-10-CM | POA: Diagnosis not present

## 2021-07-01 LAB — BLOOD GAS, ARTERIAL
Acid-base deficit: 1.8 mmol/L (ref 0.0–2.0)
Bicarbonate: 21.9 mmol/L (ref 20.0–28.0)
FIO2: 0.3
O2 Saturation: 96.5 %
Patient temperature: 97.7
pCO2 arterial: 35.3 mmHg (ref 32.0–48.0)
pH, Arterial: 7.408 (ref 7.350–7.450)
pO2, Arterial: 85.7 mmHg (ref 83.0–108.0)

## 2021-07-01 LAB — CBC
HCT: 47.2 % (ref 39.0–52.0)
Hemoglobin: 14.4 g/dL (ref 13.0–17.0)
MCH: 27.6 pg (ref 26.0–34.0)
MCHC: 30.5 g/dL (ref 30.0–36.0)
MCV: 90.6 fL (ref 80.0–100.0)
Platelets: 235 10*3/uL (ref 150–400)
RBC: 5.21 MIL/uL (ref 4.22–5.81)
RDW: 14.1 % (ref 11.5–15.5)
WBC: 13 10*3/uL — ABNORMAL HIGH (ref 4.0–10.5)
nRBC: 0 % (ref 0.0–0.2)

## 2021-07-01 LAB — BASIC METABOLIC PANEL
Anion gap: 10 (ref 5–15)
BUN: 38 mg/dL — ABNORMAL HIGH (ref 8–23)
CO2: 22 mmol/L (ref 22–32)
Calcium: 8.5 mg/dL — ABNORMAL LOW (ref 8.9–10.3)
Chloride: 109 mmol/L (ref 98–111)
Creatinine, Ser: 1.15 mg/dL (ref 0.61–1.24)
GFR, Estimated: >60 mL/min (ref >60)
Glucose, Bld: 121 mg/dL — ABNORMAL HIGH (ref 70–99)
Potassium: 4.2 mmol/L (ref 3.5–5.1)
Sodium: 141 mmol/L (ref 135–145)

## 2021-07-01 LAB — LEGIONELLA PNEUMOPHILA SEROGP 1 UR AG: L. pneumophila Serogp 1 Ur Ag: NEGATIVE

## 2021-07-01 LAB — GLUCOSE, CAPILLARY
Glucose-Capillary: 100 mg/dL — ABNORMAL HIGH (ref 70–99)
Glucose-Capillary: 101 mg/dL — ABNORMAL HIGH (ref 70–99)

## 2021-07-01 LAB — PHOSPHORUS: Phosphorus: 3.4 mg/dL (ref 2.5–4.6)

## 2021-07-01 LAB — MAGNESIUM: Magnesium: 2.4 mg/dL (ref 1.7–2.4)

## 2021-07-01 MED ORDER — GABAPENTIN 300 MG PO CAPS
900.0000 mg | ORAL_CAPSULE | Freq: Every day | ORAL | Status: DC
  Administered 2021-07-02 – 2021-07-07 (×6): 900 mg via ORAL
  Filled 2021-07-01 (×7): qty 3

## 2021-07-01 MED ORDER — ORAL CARE MOUTH RINSE
15.0000 mL | Freq: Two times a day (BID) | OROMUCOSAL | Status: DC
  Administered 2021-07-01 – 2021-07-08 (×13): 15 mL via OROMUCOSAL

## 2021-07-01 MED ORDER — PROPRANOLOL HCL 10 MG PO TABS
10.0000 mg | ORAL_TABLET | Freq: Every day | ORAL | Status: DC
  Administered 2021-07-02 – 2021-07-08 (×7): 10 mg via ORAL
  Filled 2021-07-01 (×7): qty 1

## 2021-07-01 MED ORDER — SODIUM CHLORIDE 0.9 % IV SOLN
2.0000 g | Freq: Two times a day (BID) | INTRAVENOUS | Status: DC
  Administered 2021-07-01 – 2021-07-03 (×4): 2 g via INTRAVENOUS
  Filled 2021-07-01 (×4): qty 2

## 2021-07-01 MED ORDER — ASPIRIN 81 MG PO CHEW
81.0000 mg | CHEWABLE_TABLET | Freq: Every day | ORAL | Status: DC
  Administered 2021-07-02 – 2021-07-08 (×7): 81 mg via ORAL
  Filled 2021-07-01 (×7): qty 1

## 2021-07-01 MED ORDER — ACETAMINOPHEN 325 MG PO TABS
650.0000 mg | ORAL_TABLET | Freq: Four times a day (QID) | ORAL | Status: DC | PRN
  Administered 2021-07-06: 650 mg via ORAL
  Filled 2021-07-01: qty 2

## 2021-07-01 MED ORDER — LOSARTAN POTASSIUM 50 MG PO TABS
50.0000 mg | ORAL_TABLET | Freq: Every day | ORAL | Status: DC
  Administered 2021-07-02 – 2021-07-08 (×7): 50 mg via ORAL
  Filled 2021-07-01 (×7): qty 1

## 2021-07-01 MED ORDER — ACETAMINOPHEN 650 MG RE SUPP: 650.0000 mg | Freq: Four times a day (QID) | RECTAL | Status: DC | PRN

## 2021-07-01 MED ORDER — CLONAZEPAM 0.5 MG PO TABS
0.5000 mg | ORAL_TABLET | Freq: Every day | ORAL | Status: DC
  Administered 2021-07-02 – 2021-07-07 (×6): 0.5 mg via ORAL
  Filled 2021-07-01 (×6): qty 1

## 2021-07-01 MED ORDER — GABAPENTIN 300 MG PO CAPS
600.0000 mg | ORAL_CAPSULE | Freq: Every day | ORAL | Status: DC
  Administered 2021-07-02 – 2021-07-08 (×7): 600 mg via ORAL
  Filled 2021-07-01 (×7): qty 2

## 2021-07-01 MED ORDER — SENNOSIDES-DOCUSATE SODIUM 8.6-50 MG PO TABS
1.0000 | ORAL_TABLET | Freq: Every evening | ORAL | Status: DC | PRN
  Administered 2021-07-05: 1 via ORAL
  Filled 2021-07-01: qty 1

## 2021-07-01 NOTE — Progress Notes (Signed)
PHARMACY NOTE:  ANTIMICROBIAL RENAL DOSAGE ADJUSTMENT  Current antimicrobial regimen includes a mismatch between antimicrobial dosage and estimated renal function.  As per policy approved by the Pharmacy & Therapeutics and Medical Executive Committees, the antimicrobial dosage will be adjusted accordingly.  Current antimicrobial dosage:  Cefepime 2g IV q8h  Indication: Pneumonia  Renal Function: Estimated Creatinine Clearance: 49.4 mL/min (by C-G formula based on SCr of 1.15 mg/dL).    Antimicrobial dosage has been changed to:  Cefepime 2g IV q12h  Thank you for allowing pharmacy to be a part of this patient's care. Gretta Arab PharmD, BCPS Clinical Pharmacist [facility] main pharmacy [phone] 07/01/2021 7:47 AM

## 2021-07-01 NOTE — TOC Progression Note (Signed)
Transition of Care [facility]) - Progression Note    Patient Details  Name: Alvin Hardy MRN: [id number] Date of Birth: [birth year]  Transition of Care [facility] CM/SW Contact  Leeroy Cha, RN Phone Number: 07/01/2021, 7:42 AM  Clinical Narrative:    86 year old male admitted for pneumonia after failed outpatient treatment. PCCM called to bedside on 8/29 for respiratory distress requiring intubation. He remained hypoxemic despite intubation. ETT was checked with glidescope and colorimetry to ensure correct placement. ETT was exchanged for size 8 ETT with improvement in oxygenation. Remains on full vent support. Failed SBT. Will continue weaning sedation to facilitate possible extubation tomorrow   Acute hypoxemic respiratory failure secondary to suspected aspiration pneumonitis/pneumonia Severe sepsis secondary to aspiration pneumonia --Full vent support. On minimal settings --Change sedation from fentanyl to Precedex to facilitate weaning --Scheduled duonebs --Continue Cefepime   Acute encephalopathy secondary to critical illness --RASS goal -1  TOC PLAN OF CARE: remain on vent at 30%, iv steroids, iv sedation,wbc=13.0 Following for progression of condition and care. Expected Discharge Plan: Home/Self Care Barriers to Discharge: Continued Medical Work up  Expected Discharge Plan and Services Expected Discharge Plan: Home/Self Care   Discharge Planning Services: CM Consult   Living arrangements for the past 2 months: Apartment                                       Social Determinants of Health (SDOH) Interventions    Readmission Risk Interventions No flowsheet data found.

## 2021-07-01 NOTE — Progress Notes (Signed)
NAME:  Alvin Hardy, MRN:  [id number], DOB:  November 16, 1934, LOS: 3 ADMISSION DATE:  06/28/2021 CONSULTATION DATE:  06/29/2021 REFERRING MD:  Maylene Roes - [facility] CHIEF COMPLAINT:  SOB, dyspnea   History of Present Illness:  86 year old man who presented to [facility] ED 8/28 for SOB and fever. Found to have multifocal PNA. PMHx significant for lung CA (in remission, s/p lobectomy), HTN, HLD, GERD, Gilbert's syndrome, anxiety, bipolar disorder.  Initially seen by PCP a few days prior to admission, placed on doxycycline without improvement; he was subsequently placed on azithromycin and prednisone and Decadron x 1 and albuterol were administered in the office. Despite these interventions, SOB and dyspnea persisted and patient presented to ED. CT Chest was completed demonstrating multifocal PNA and empiric antibiotics/Solumedrol were initiated.  PCCM consulted 8/29 in the setting of worsening respiratory distress and patient required intubation and mechanical ventilation  Pertinent Medical History:   Past Medical History:  Diagnosis Date   Allergy    Arthritis    psoriatic arthritis, ? , treated /w mmethotrexate    Bipolar 1 disorder (HCC)    BPH (benign prostatic hyperplasia)    Cataract 2010   Bilateral   Chest pain    Depression    Deviated septum    TO THE LEFT   ED (erectile dysfunction)    Essential hypertension 06/28/2021   Finger injury    mallet finger-- 02/13/2013, cast in place, followed by dr. Fredna Dow   GERD (gastroesophageal reflux disease)    Gilbert's syndrome    History of radiation therapy 07/24/13-08/27/13   50Gy/25 fx chest   Hyperlipidemia    Hypogonadism male    Laryngopharyngeal reflux    lung ca dx'd 12/2012   rul   Lung mass 02/07/13   RIGHT UPPER LOBE   Morton's neuroma    LEFT FOOT   Neuromuscular disorder ([place])    peripheral neuropathy   Shortness of breath    voice breaks, ? related to reflux, although told by Dr. Ardis Hughs, S.- early 83 yr. old   [facility]  Events: Including procedures, antibiotic start and stop dates in addition to other pertinent events   8/28 - Admitted to [facility] for multifocal PNA 8/29 - PCCM consulted for respiratory distress. Intubated with 7.5 ETT. Persistent air leak, ETT exchanged for 8.0; persistent leak, bronch completed with mild secretions. 8/30 No overnight events, on vent and antibiotics, not requiring pressors 8/31 Tolerating SBT/SAT, no overnight events  Interim History / Subjective:   No overnight events 700cc UOP yesterday Plan to extubate today   Objective:  Blood pressure 140/67, pulse 78, temperature 97.6 F (36.4 C), temperature source Oral, resp. rate 19, height 5\' 10"  (1.778 m), weight 86.9 kg, SpO2 95 %.    Vent Mode: CPAP;PSV FiO2 (%):  [30 %-40 %] 30 % Set Rate:  [18 bmp-20 bmp] 19 bmp Vt Set:  [580 mL] 580 mL PEEP:  [5 cmH20-8 cmH20] 5 cmH20 Plateau Pressure:  [10 cmH20-22 cmH20] 21 cmH20   Intake/Output Summary (Last 24 hours) at 07/01/2021 0947 Last data filed at 07/01/2021 0900 Gross per 24 hour  Intake 1050.34 ml  Output 800 ml  Net 250.34 ml    Filed Weights   06/30/21 0800  Weight: 86.9 kg     General:  elderly M, awake and responsive following commands on vent HEENT: MM pink/moist, ETT in place, pupils equal Neuro: alert, nodding to questions and moving all extremities to command CV: s1s2 rrr, no m/r/g PULM:  mild rhonchi  L base, no wheezing, tolerating PSV with good tidal volumes GI: soft, distended and appears mildly uncomfortable on palpation, bsx4 active  Extremities: warm/dry, no edema  Skin: no rashes or lesions    Resolved Hospital Problem List:    Assessment & Plan:     Acute hypoxic respiratory failure in the setting of multifocal PNA Intubation 8/29 with air leak/delayed color change s/p ETT 7.5 tube placement. Exhcanged for 8.0 tube with persistent issue. Patient was then bronched and mild secretions  Awake with successful SAT and SBT  8/31 P: -respiratory culture with prelim few gm +cocci and rods and gm - rods on gram stain, continue to follow -plan to extubate on Precedex today, tolerating vent weaning well and awake and responsive  -urine strep pneumo negative, legionella pending -echo with LVEF 45-50% and grade II diastolic dysfunction, appears dry clinically this AM, so will hold diuresis, may need discharged on Lasix -IS when able - Consider dedicated CT Neck vs. repeat CT Chest if ongoing c/f TE fistula - Pulmonary toilet  - Trend WBC, fever curve   History of lung CA s/p RUL lobectomy and radiation - Surveillance monitoring per protocol  Hypertension Hyperlipidemia - Somewhat bradycardic, holding home bb, continue cozaar and prn hydralazine  Bipolar disorder Anxiety - PAD as above while intubated for goal RASS 0 to -1 - Resume home anxiolytics as clinically appropriate -Continue home lithium  Best Practice: (right click and "Reselect all SmartList Selections" daily)   Diet/type: NPO DVT prophylaxis: LMWH GI prophylaxis: PPI Lines: N/A Foley:  N/A Code Status:  full code Last date of multidisciplinary goals of care discussion [8/29 - Patient/wife verify that he would like to remain a Full Code prior to intubation]  Labs:  CBC: Recent Labs  Lab 06/28/21 0100 06/29/21 0304 06/30/21 0249 07/01/21 0245  WBC 15.1* 12.4* 13.4* 13.0*  NEUTROABS 12.3*  --   --   --   HGB 14.5 13.1 14.2 14.4  HCT 45.4 42.5 47.5 47.2  MCV 89.5 90.6 94.2 90.6  PLT 207 194 221 034    Basic Metabolic Panel: Recent Labs  Lab 06/28/21 0100 06/29/21 0304 06/30/21 0249 07/01/21 0245  NA 136 140 140 141  K 3.6 4.3 4.9 4.2  CL 103 108 108 109  CO2 22 26 26 22   GLUCOSE 173* 115* 97 121*  BUN 21 16 28* 38*  CREATININE 0.92 0.93 1.04 1.15  CALCIUM 9.1 8.5* 8.6* 8.5*  MG  --   --  2.5* 2.4  PHOS  --   --  3.3 3.4    GFR: Estimated Creatinine Clearance: 49.4 mL/min (by C-G formula based on SCr of 1.15  mg/dL). Recent Labs  Lab 06/28/21 0100 06/28/21 0420 06/28/21 0630 06/28/21 0844 06/29/21 0304 06/29/21 0936 06/30/21 0249 07/01/21 0245  WBC 15.1*  --   --   --  12.4*  --  13.4* 13.0*  LATICACIDVEN 2.2* 4.7* 4.8* 2.6*  --  1.3  --   --     Liver Function Tests: Recent Labs  Lab 06/28/21 0100 06/29/21 0304  AST 28 22  ALT 20 18  ALKPHOS 47 38  BILITOT 1.1 0.7  PROT 6.8 5.9*  ALBUMIN 3.9 3.2*    No results for input(s): LIPASE, AMYLASE in the last 168 hours. No results for input(s): AMMONIA in the last 168 hours.  ABG:    Component Value Date/Time   PHART 7.408 07/01/2021 0332   PCO2ART 35.3 07/01/2021 0332   PO2ART 85.7 07/01/2021 0332  HCO3 21.9 07/01/2021 0332   TCO2 30 02/22/2013 0421   ACIDBASEDEF 1.8 07/01/2021 0332   O2SAT 96.5 07/01/2021 0332   Coagulation Profile: Recent Labs  Lab 06/28/21 0100  INR 1.1    Cardiac Enzymes: No results for input(s): CKTOTAL, CKMB, CKMBINDEX, TROPONINI in the last 168 hours.  HbA1C: No results found for: HGBA1C  CBG: No results for input(s): GLUCAP in the last 168 hours.  Review of Systems:   Unable to obtain secondary to intubation and sedation  Past Medical History:  He,  has a past medical history of Allergy, Arthritis, Bipolar 1 disorder ([place]), BPH (benign prostatic hyperplasia), Cataract (2010), Chest pain, Depression, Deviated septum, ED (erectile dysfunction), Essential hypertension (06/28/2021), Finger injury, GERD (gastroesophageal reflux disease), Gilbert's syndrome, History of radiation therapy (07/24/13-08/27/13), Hyperlipidemia, Hypogonadism male, Laryngopharyngeal reflux, lung ca (dx'd 12/2012), Lung mass (02/07/13), Morton's neuroma, Neuromuscular disorder ([place]), and Shortness of breath.   Surgical History:   Past Surgical History:  Procedure Laterality Date   CYSTOSCOPY N/A 02/21/2013   Procedure: CYSTOSCOPY FLEXIBLE with insertion of foley catheter;  Surgeon: Fredricka Bonine, MD;   Location: [place];  Service: Urology;  Laterality: N/A;   EYE SURGERY     cataracts removed fr. both eyes, IOL in place    KNEE ARTHROSCOPY Right 01/2007   PILONIDAL CYST EXCISION  1960's   PROSTATE SURGERY  1996   TURP   VIDEO ASSISTED THORACOSCOPY (VATS)/WEDGE RESECTION Right 02/21/2013   Procedure: Right VIDEO ASSISTED THORACOSCOPY ,Thoracotomy with right upper lobectomy, node sampling ;  Surgeon: Grace Isaac, MD;  Location: [place];  Service: Thoracic;  Laterality: Right;   VIDEO BRONCHOSCOPY N/A 02/21/2013   Procedure: VIDEO BRONCHOSCOPY;  Surgeon: Grace Isaac, MD;  Location: [place];  Service: Thoracic;  Laterality: N/A;    Social History:   reports that he quit smoking about 42 years ago. His smoking use included cigarettes. He has never used smokeless tobacco. He reports current alcohol use. He reports that he does not use drugs.   Family History:  His family history includes Cancer in his maternal aunt and mother; Diabetes in his mother and son; Heart disease in his father.   Allergies: Allergies  Allergen Reactions   Codeine Anaphylaxis   Hydrocodone-Acetaminophen     Other reaction(s): Other (See Comments) Reactions unknown   Omeprazole Magnesium     Other reaction(s): Other (See Comments) Reaction Unknown   Tramadol     Other reaction(s): Other (See Comments) Reactions Unknown   Decadron [Dexamethasone]     Pt says it is contraindication due to the lithium he is taking   Fenofibrate     Other reaction(s): Other (See Comments) Reactions Unknown   Neomycin-Bacitracin Zn-Polymyx     Other reaction(s): Other (See Comments) Reactions Unknown   Other     Pt. Remarks that all pain med. Make him nauseated    Penicillin G     Other reaction(s): Other (See Comments) Reactions Unknown   Propoxyphene     Other reaction(s): Other (See Comments) Reaction Unknown   Statins     Lipitor, Crestor, Zocor all caused muscle aches   Ubidecarenone     Other reaction(s):  Other (See Comments) Reactions Unknown   Zetia [Ezetimibe]     Muscle aches   Zoloft [Sertraline Hcl] Other (See Comments)    REACTION:  unknown    Home Medications: Prior to Admission medications   Medication Sig Start Date End Date Taking? Authorizing Provider  albuterol (VENTOLIN HFA)  108 (90 Base) MCG/ACT inhaler Inhale 2 puffs into the lungs every 4 (four) hours as needed for shortness of breath. 06/22/21  Yes [provider]  aspirin EC 81 MG tablet Take 81 mg by mouth every morning.    Yes [provider]  Calcium Carbonate-Vitamin D 600-400 MG-UNIT tablet Take 1 tablet by mouth every morning.   Yes [provider]  Cholecalciferol (VITAMIN D-3) 1000 UNITS CAPS Take 1,000 Units by mouth every morning.    Yes [provider]  clobetasol cream (TEMOVATE) 9.37 % Apply 1 application topically daily. Reported on 01/27/2016   Yes [provider]  clonazePAM (KLONOPIN) 0.5 MG tablet Take 0.5 mg by mouth at bedtime.   Yes [provider]  cyanocobalamin 1000 MCG tablet Take 1,000 mcg by mouth every morning.    Yes [provider]  esomeprazole (NEXIUM) 40 MG capsule Take 40 mg by mouth daily before breakfast.   Yes [provider]  finasteride (PROSCAR) 5 MG tablet Take 5 mg by mouth daily before breakfast.    Yes [provider]  gabapentin (NEURONTIN) 300 MG capsule Take 1,200 mg by mouth at bedtime. He takes two capsules every morning and three capsules at bedtime.   Yes [provider]  lithium carbonate (LITHOBID) 300 MG CR tablet Take 600 mg by mouth at bedtime.    Yes [provider]  Multiple Vitamin (MULTIVITAMIN WITH MINERALS) TABS tablet Take 1 tablet by mouth every morning.   Yes [provider]  OVER THE COUNTER MEDICATION Take 1 tablet by mouth 2 (two) times daily. Glucosamine Chondroitin 1500mg /1200mg    Yes [provider]  Probiotic Product (PROBIOTIC DAILY PO) Take  1 each by mouth daily.   Yes [provider]  propranolol (INDERAL) 10 MG tablet Take 10-20 mg by mouth daily as needed (For extremely stressful circumstances.).    Yes [provider]  REPATHA SURECLICK 169 MG/ML SOAJ Inject 140 mg as directed every 14 (fourteen) days. 05/28/16  Yes [provider]  tamsulosin (FLOMAX) 0.4 MG CAPS capsule Take 0.4 mg by mouth daily. 12/19/14  Yes [provider]  terbinafine (LAMISIL) 250 MG tablet Take 250 mg by mouth daily. 01/25/20  Yes [provider]  testosterone cypionate (DEPOTESTOTERONE CYPIONATE) 200 MG/ML injection Inject 0.5 mLs into the muscle every 21 ( twenty-one) days. Now 300mg /ml 11/05/14  Yes [provider]  vardenafil (LEVITRA) 5 MG tablet Take 5 mg by mouth daily as needed for erectile dysfunction.    Yes [provider]    Critical care time: 32 minutes     CRITICAL CARE Performed by: Otilio Carpen Nima Kemppainen   Total critical care time: 32 minutes  Critical care time was exclusive of separately billable procedures and treating other patients.  Critical care was necessary to treat or prevent imminent or life-threatening deterioration.  Critical care was time spent personally by me on the following activities: development of treatment plan with patient and/or surrogate as well as nursing, discussions with consultants, evaluation of patient's response to treatment, examination of patient, obtaining history from patient or surrogate, ordering and performing treatments and interventions, ordering and review of laboratory studies, ordering and review of radiographic studies, pulse oximetry and re-evaluation of patient's condition.   Otilio Carpen Kimia Finan, PA-C [facility] See Amion for pager If no response to pager , please call [phone] until 7pm After 7:00 pm call Elink  [phone]?[place]

## 2021-07-01 NOTE — Progress Notes (Signed)
[place] Progress Note Patient Name: Alvin Hardy DOB: 1935/10/28 MRN: [id number]   Date of Service  07/01/2021  HPI/Events of Note  Patient was supposed to have an MRA with his MRI brainbut it was aborted due to extreme patient anxiety.  eICU Interventions  Will defer to the daytime PCCM attending regarding whether they would like the patient to go back for the MRA in the light of the negative MRI study.        Kerry Kass Guido Comp 07/01/2021, 9:00 PM

## 2021-07-01 NOTE — Progress Notes (Signed)
Patient passed the weaning trial. He was extubated to a 3L [place] with no incident. He is tolerating the 3lpm [place] well at this time with no distress. Vitals are: HR79 RR22 and 99% on 3lpm [place]. RT will continue to monitor

## 2021-07-01 NOTE — Progress Notes (Signed)
Patient placed in wean mode and tolerating well at this time with no distress. The patient appears to be awake and more alert. Rt will continue to monitor

## 2021-07-01 NOTE — Progress Notes (Signed)
Extubation Procedure Note  Patient Details:   Name: Alvin Hardy DOB: 21-Jun-1935 MRN: [id number]   Airway Documentation:    Vent end date: 07/01/21 Vent end time: 1009   Evaluation  O2 sats: currently acceptable Complications: No apparent complications Patient did not tolerate procedure well. Breath sounds clear   Yes  Joellyn Rued 07/01/2021, 6:14 PM

## 2021-07-01 NOTE — Progress Notes (Signed)
Pulmonary Progress Note  Post-extubation patient demonstrates expressive aphasia. Otherwise non-focal exam. Able to follow commands. Would be out of the window for emergent neurological intervention. Still needs further work-up with MRI brain and EEG.

## 2021-07-02 ENCOUNTER — Inpatient Hospital Stay (HOSPITAL_COMMUNITY): Payer: Medicare Other

## 2021-07-02 DIAGNOSIS — R29810 Facial weakness: Secondary | ICD-10-CM

## 2021-07-02 DIAGNOSIS — R4701 Aphasia: Secondary | ICD-10-CM

## 2021-07-02 DIAGNOSIS — R1310 Dysphagia, unspecified: Secondary | ICD-10-CM

## 2021-07-02 LAB — BASIC METABOLIC PANEL
Anion gap: 10 (ref 5–15)
BUN: 33 mg/dL — ABNORMAL HIGH (ref 8–23)
CO2: 23 mmol/L (ref 22–32)
Calcium: 8.3 mg/dL — ABNORMAL LOW (ref 8.9–10.3)
Chloride: 111 mmol/L (ref 98–111)
Creatinine, Ser: 1.11 mg/dL (ref 0.61–1.24)
GFR, Estimated: >60 mL/min (ref >60)
Glucose, Bld: 89 mg/dL (ref 70–99)
Potassium: 3.8 mmol/L (ref 3.5–5.1)
Sodium: 144 mmol/L (ref 135–145)

## 2021-07-02 LAB — CBC
HCT: 42.6 % (ref 39.0–52.0)
Hemoglobin: 13.4 g/dL (ref 13.0–17.0)
MCH: 27.6 pg (ref 26.0–34.0)
MCHC: 31.5 g/dL (ref 30.0–36.0)
MCV: 87.8 fL (ref 80.0–100.0)
Platelets: 250 10*3/uL (ref 150–400)
RBC: 4.85 MIL/uL (ref 4.22–5.81)
RDW: 14 % (ref 11.5–15.5)
WBC: 13.6 10*3/uL — ABNORMAL HIGH (ref 4.0–10.5)
nRBC: 0 % (ref 0.0–0.2)

## 2021-07-02 LAB — GLUCOSE, CAPILLARY
Glucose-Capillary: 90 mg/dL (ref 70–99)
Glucose-Capillary: 103 mg/dL — ABNORMAL HIGH (ref 70–99)

## 2021-07-02 LAB — LDL CHOLESTEROL, DIRECT: Direct LDL: 32.8 mg/dL (ref 0–99)

## 2021-07-02 LAB — HEMOGLOBIN A1C
Hgb A1c MFr Bld: 5.7 % — ABNORMAL HIGH (ref 4.8–5.6)
Mean Plasma Glucose: 116.89 mg/dL

## 2021-07-02 MED ORDER — IOHEXOL 350 MG/ML SOLN
80.0000 mL | Freq: Once | INTRAVENOUS | Status: AC | PRN
  Administered 2021-07-02: 80 mL via INTRAVENOUS

## 2021-07-02 MED ORDER — MENTHOL 3 MG MT LOZG
1.0000 | LOZENGE | Freq: Two times a day (BID) | OROMUCOSAL | Status: DC | PRN
  Administered 2021-07-02: 3 mg via ORAL
  Filled 2021-07-02: qty 9

## 2021-07-02 MED ORDER — CLOPIDOGREL BISULFATE 75 MG PO TABS
75.0000 mg | ORAL_TABLET | Freq: Every day | ORAL | Status: DC
  Administered 2021-07-02 – 2021-07-08 (×7): 75 mg via ORAL
  Filled 2021-07-02 (×7): qty 1

## 2021-07-02 NOTE — Progress Notes (Signed)
SLP  Note  Patient Details Name: Alvin Hardy MRN: [id number] DOB: July 03, 1935   Cancelled treatment:         MBS completed, full report to follow.  Pt with oral dysphagia and intact pharyngeal swallow ability.   Impaired lingual control resulting in lingual pumping and premature spillage.  Pharyngeal swallow is strong and timely without retention.    NO aspiration of any consistency noted however poor oral control  increasing aspiration risk with solids requiring mastication and thinner liquids.  And pt with explosive cough after given water via cup with suspected aspiration this am causing discomfort.    Pt noted to become fatigued during MBS and was observed to cough x2 without barium visualized in larynx/trachea.  Recommend conservative diet  of dys1/nectar via cup/straw and tsps of thin anytime.     Anticipate pt will be eligible for rapid dietary advancement with improved respiratory status and oral control.    Kathleen Lime, MS [facility] SLP Acute Rehab Services Office [phone] Pager [phone]    Macario Golds 07/02/2021, 1:50 PM

## 2021-07-02 NOTE — Evaluation (Signed)
Clinical/Bedside Swallow Evaluation Patient Details  Name: Alvin Hardy MRN: [id number] Date of Birth: [birth year]  Today's Date: 07/02/2021 Time: SLP Start Time (ACUTE ONLY): 22 SLP Stop Time (ACUTE ONLY): 0901 SLP Time Calculation (min) (ACUTE ONLY): 46 min  Past Medical History:  Past Medical History:  Diagnosis Date   Allergy    Arthritis    psoriatic arthritis, ? , treated /w mmethotrexate    Bipolar 1 disorder (HCC)    BPH (benign prostatic hyperplasia)    Cataract 2010   Bilateral   Chest pain    Depression    Deviated septum    TO THE LEFT   ED (erectile dysfunction)    Essential hypertension 06/28/2021   Finger injury    mallet finger-- 02/13/2013, cast in place, followed by dr. Fredna Dow   GERD (gastroesophageal reflux disease)    Gilbert's syndrome    History of radiation therapy 07/24/13-08/27/13   50Gy/25 fx chest   Hyperlipidemia    Hypogonadism male    Laryngopharyngeal reflux    lung ca dx'd 12/2012   rul   Lung mass 02/07/13   RIGHT UPPER LOBE   Morton's neuroma    LEFT FOOT   Neuromuscular disorder ([place])    peripheral neuropathy   Shortness of breath    voice breaks, ? related to reflux, although told by Dr. Ardis Hughs, S.- early 54 yr. old   Past Surgical History:  Past Surgical History:  Procedure Laterality Date   CYSTOSCOPY N/A 02/21/2013   Procedure: CYSTOSCOPY FLEXIBLE with insertion of foley catheter;  Surgeon: Fredricka Bonine, MD;  Location: [place];  Service: Urology;  Laterality: N/A;   EYE SURGERY     cataracts removed fr. both eyes, IOL in place    KNEE ARTHROSCOPY Right 01/2007   PILONIDAL CYST EXCISION  1960's   PROSTATE SURGERY  1996   TURP   VIDEO ASSISTED THORACOSCOPY (VATS)/WEDGE RESECTION Right 02/21/2013   Procedure: Right VIDEO ASSISTED THORACOSCOPY ,Thoracotomy with right upper lobectomy, node sampling ;  Surgeon: Grace Isaac, MD;  Location: [place];  Service: Thoracic;  Laterality: Right;   VIDEO BRONCHOSCOPY N/A 02/21/2013    Procedure: VIDEO BRONCHOSCOPY;  Surgeon: Grace Isaac, MD;  Location: [place];  Service: Thoracic;  Laterality: N/A;   HPI:  Pt is an 86 yo male adm to [facility] with respiratory deficits- requiring intubation.  [name] s s/p intubation x3 days - 8/29-8/31.  Pt failed yale six hours post extubation, asp pna, asp pneumonitis, h/o reflux, h/o LPR, 86 year old man who presented to [facility] ED 8/28 for SOB and fever. Found to have multifocal PNA. PMHx significant for lung CA (in remission, s/p lobectomy), HTN, HLD, GERD, Gilbert's syndrome, anxiety, bipolar disorder.     Initially seen by PCP a few days prior to admission, placed on doxycycline without improvement; he was subsequently placed on azithromycin and prednisone and Decadron x 1 and albuterol were administered in the office per RD notes. ,Swallow eval ordered due to pt failing yale swallow screen.   Assessment / Plan / Recommendation Clinical Impression  MBS indicated due to pt demonstrating clnical indications concerning for airway infiltration and dysphagia, potential neurological changes, likely aspiration pneumonia, and intubation x3 days .  Subtle left facial asymmetry noted = concerning for facial nerve involvement.  Coughing with increased RR to mid-20s noted with intake - most notably with thin water..  Suspect pt with permature spillage of barium into pharynx/larynx - poorly controlled allow aspiration before the swallow.  Pt  is aphasic but initially denied dysphagia since extubation- now admitting problems in both mouth and throat.   Pt admits to episode of refluxing significantly prior to admit.  His expressive aphasia allows him to verbalize single words - but motor planning deficits are impacting beyond single words.  Recommend pt have medication with applesauce and single ice chips pending instrumental swallow evaluation. SLP Visit Diagnosis: Dysphagia, unspecified (R13.10)    Aspiration Risk  Moderate aspiration risk    Diet Recommendation  Ice chips PRN after oral care;NPO except meds   Medication Administration: Crushed with puree    Other  Recommendations     Follow up Recommendations None      Frequency and Duration min 2x/week          Prognosis Prognosis for Safe Diet Advancement: Guarded Barriers to Reach Goals: Time post onset      Swallow Study   General HPI: Pt is an 86 yo male adm to [facility] with respiratory deficits- requiring intubation.  [name] s s/p intubation x3 days - 8/29-8/31.  Pt failed yale six hours post extubation, asp pna, asp pneumonitis, h/o reflux, h/o LPR, 86 year old man who presented to [facility] ED 8/28 for SOB and fever. Found to have multifocal PNA. PMHx significant for lung CA (in remission, s/p lobectomy), HTN, HLD, GERD, Gilbert's syndrome, anxiety, bipolar disorder.     Initially seen by PCP a few days prior to admission, placed on doxycycline without improvement; he was subsequently placed on azithromycin and prednisone and Decadron x 1 and albuterol were administered in the office per RD notes. ,Swallow eval ordered due to pt failing yale swallow screen. Type of Study: Bedside Swallow Evaluation Diet Prior to this Study: NPO Respiratory Status: Room air History of Recent Intubation: Yes Length of Intubations (days): 3 days Date extubated: 07/01/21 Behavior/Cognition: Alert;Cooperative Oral Cavity Assessment: Within Functional Limits Oral Care Completed by SLP: Yes (pt brushed his teeth) Oral Cavity - Dentition: Adequate natural dentition Self-Feeding Abilities: Needs assist Baseline Vocal Quality: Low vocal intensity Volitional Cough: Strong Volitional Swallow: Able to elicit    Oral/Motor/Sensory Function Overall Oral Motor/Sensory Function: Mild impairment Facial ROM: Suspected CN VII (facial) dysfunction;Reduced left Facial Symmetry: Abnormal symmetry left;Other (Comment) (slight facial asymmetry on left) Lingual ROM: Other (Comment) (generalized weakness) Velum: Within Functional  Limits Mandible: Other (Comment) (dnt)   Ice Chips Ice chips: Within functional limits Presentation: Spoon   Thin Liquid Thin Liquid: Impaired Presentation: Cup;Self Fed;Spoon Oral Phase Impairments: Reduced lingual movement/coordination Oral Phase Functional Implications: Oral holding Pharyngeal  Phase Impairments: Cough - Immediate Other Comments: overt coughing when self feeding thin water - with mild distress that recovered    Nectar Thick Nectar Thick Liquid: Impaired Presentation: Spoon;Straw Oral Phase Impairments: Reduced lingual movement/coordination Oral phase functional implications: Prolonged oral transit Pharyngeal Phase Impairments: Cough - Immediate Other Comments: chin tuck posture appeared helpful to protect airway   Honey Thick Honey Thick Liquid: Not tested   Puree Puree: Impaired   Solid     Solid: Impaired Presentation: Spoon Oral Phase Impairments: Reduced lingual movement/coordination;Impaired mastication Oral Phase Functional Implications: Impaired mastication;Prolonged oral transit      Macario Golds 07/02/2021,9:18 AM Kathleen Lime, MS [facility] SLP [place] Office [phone] Pager [phone]

## 2021-07-02 NOTE — Consult Note (Addendum)
Neurology Consultation  Reason for Consult: Aphasia Referring Physician: Dr. Dwyane Dee  CC: Aphasia  History is obtained from: Chart review, bedside RN, unable to obtain from patient due to patient's condition  HPI: Alvin Hardy is a 86 y.o. male with a medical history significant for essential hypertension, hyperlipidemia, lung cancer in remission s/p right upper lobectomy and radiation therapy, remote tobacco use over 40 years ago, bipolar 1 disorder, depression, GERD, Gilbert's syndrome, and peripheral neuropathy who presented to the ED on 06/28/2021 from his independent living facility due to progressive shortness of breath, fever with temperature maximum of 102.6, and decreased appetite over one week without improvement with doxycycline, prednisone, Z-Pak, albuterol inhaler, prednisone, and a dose of dexamethasone at his PCP over 2 visits. On arrival to the ED he was noted to be hypotensive and hypoxic requiring supplemental oxygen with a leukocytosis and CT chest evidence of multifocal pneumonia with a lactic acidosis of 4.7. He had increased difficulty breathing requiring intubation and he was started on antibiotic therapy and solumedrol. Patient remained hypoxic with respiratory distress requiring intubation on 06/29/2021. He was extubated today on 07/01/2021 and was noted to have new expressive aphasia and dysphagia with increased risk of aspiration. Neurology was consulted for further evaluation of patient aphasia.  LKW: 06/29/2021 prior to intubation TNK given?: no, well out of thrombolytic therapy window IR Thrombectomy? No, last known well prior to intubation 06/29/2021 Modified Rankin Scale: 0-Completely asymptomatic and back to baseline post- stroke  ROS: Unable to obtain due to altered mental status.   Past Medical History:  Diagnosis Date   Allergy    Arthritis    psoriatic arthritis, ? , treated /w mmethotrexate    Bipolar 1 disorder ([place])    BPH (benign prostatic hyperplasia)     Cataract 2010   Bilateral   Chest pain    Depression    Deviated septum    TO THE LEFT   ED (erectile dysfunction)    Essential hypertension 06/28/2021   Finger injury    mallet finger-- 02/13/2013, cast in place, followed by dr. Fredna Dow   GERD (gastroesophageal reflux disease)    Gilbert's syndrome    History of radiation therapy 07/24/13-08/27/13   50Gy/25 fx chest   Hyperlipidemia    Hypogonadism male    Laryngopharyngeal reflux    lung ca dx'd 12/2012   rul   Lung mass 02/07/13   RIGHT UPPER LOBE   Morton's neuroma    LEFT FOOT   Neuromuscular disorder ([place])    peripheral neuropathy   Shortness of breath    voice breaks, ? related to reflux, although told by Dr. Ardis Hughs, S.- early 37 yr. old   Past Surgical History:  Procedure Laterality Date   CYSTOSCOPY N/A 02/21/2013   Procedure: CYSTOSCOPY FLEXIBLE with insertion of foley catheter;  Surgeon: Fredricka Bonine, MD;  Location: [facility];  Service: Urology;  Laterality: N/A;   EYE SURGERY     cataracts removed fr. both eyes, IOL in place    KNEE ARTHROSCOPY Right 01/2007   PILONIDAL CYST EXCISION  1960's   PROSTATE SURGERY  1996   TURP   VIDEO ASSISTED THORACOSCOPY (VATS)/WEDGE RESECTION Right 02/21/2013   Procedure: Right VIDEO ASSISTED THORACOSCOPY ,Thoracotomy with right upper lobectomy, node sampling ;  Surgeon: Grace Isaac, MD;  Location: [facility];  Service: Thoracic;  Laterality: Right;   VIDEO BRONCHOSCOPY N/A 02/21/2013   Procedure: VIDEO BRONCHOSCOPY;  Surgeon: Grace Isaac, MD;  Location: [facility];  Service:  Thoracic;  Laterality: N/A;   Family History  Problem Relation Age of Onset   Diabetes Mother    Cancer Mother        abdomen   Heart disease Father    Diabetes Son    Cancer Maternal Aunt        lung ca, smoker   Social History:   reports that he quit smoking about 42 years ago. His smoking use included cigarettes. He has never used smokeless tobacco. He reports current alcohol use. He reports that  he does not use drugs.  Medications  Current Facility-Administered Medications:    0.9 %  sodium chloride infusion, , Intravenous, PRN, Gleason, Otilio Carpen, PA-C, Stopped at 07/01/21 1801   acetaminophen (TYLENOL) tablet 650 mg, 650 mg, Oral, Q6H PRN **OR** acetaminophen (TYLENOL) suppository 650 mg, 650 mg, Rectal, Q6H PRN, Margaretha Seeds, MD   aspirin chewable tablet 81 mg, 81 mg, Oral, Daily, Margaretha Seeds, MD, 81 mg at 07/02/21 0941   ceFEPIme (MAXIPIME) 2 g in sodium chloride 0.9 % 100 mL IVPB, 2 g, Intravenous, Q12H, Randa Spike, RPH, Stopped at 07/02/21 7253   Chlorhexidine Gluconate Cloth 2 % PADS 6 each, 6 each, Topical, Daily, Dessa Phi, DO, 6 each at 07/02/21 0522   clonazePAM (KLONOPIN) tablet 0.5 mg, 0.5 mg, Oral, QHS, Margaretha Seeds, MD   enoxaparin (LOVENOX) injection 40 mg, 40 mg, Subcutaneous, Daily, Chotiner, Yevonne Aline, MD, 40 mg at 07/02/21 6644   gabapentin (NEURONTIN) capsule 600 mg, 600 mg, Oral, Daily, 600 mg at 07/02/21 0941 **AND** gabapentin (NEURONTIN) capsule 900 mg, 900 mg, Oral, QHS, Margaretha Seeds, MD   hydrALAZINE (APRESOLINE) injection 10-20 mg, 10-20 mg, Intravenous, Q4H PRN, Frederik Pear, MD, 20 mg at 06/30/21 2329   ipratropium-albuterol (DUONEB) 0.5-2.5 (3) MG/3ML nebulizer solution 3 mL, 3 mL, Nebulization, Q6H, Dessa Phi, DO, 3 mL at 07/02/21 0845   levalbuterol (XOPENEX) nebulizer solution 1.25 mg, 1.25 mg, Nebulization, Q6H PRN, Dessa Phi, DO, 1.25 mg at 06/29/21 1259   lithium carbonate capsule 300 mg, 300 mg, Oral, Q12H, Margaretha Seeds, MD, 300 mg at 07/02/21 0942   losartan (COZAAR) tablet 50 mg, 50 mg, Oral, Daily, Margaretha Seeds, MD, 50 mg at 07/02/21 0941   MEDLINE mouth rinse, 15 mL, Mouth Rinse, BID, Margaretha Seeds, MD, 15 mL at 07/02/21 0943   pantoprazole (PROTONIX) injection 40 mg, 40 mg, Intravenous, Q24H, Gleason, Otilio Carpen, PA-C, 40 mg at 07/02/21 0941   propranolol (INDERAL) tablet 10 mg, 10 mg,  Oral, Daily, Margaretha Seeds, MD, 10 mg at 07/02/21 0347   senna-docusate (Senokot-S) tablet 1 tablet, 1 tablet, Oral, QHS PRN, Margaretha Seeds, MD  Exam: Current vital signs: BP (!) 155/73 (BP Location: Right Arm)   Pulse 76   Temp 98.4 F (36.9 C) (Oral)   Resp (!) 26   Ht 5\' 10"  (1.778 m)   Wt 86.9 kg   SpO2 94%   BMI 27.49 kg/m  Vital signs in last 24 hours: Temp:  [98.3 F (36.8 C)-99.5 F (37.5 C)] 98.4 F (36.9 C) (09/01 1208) Pulse Rate:  [76-101] 76 (09/01 1200) Resp:  [17-29] 26 (09/01 1200) BP: (125-177)/(59-86) 155/73 (09/01 1200) SpO2:  [91 %-100 %] 94 % (09/01 1200) FiO2 (%):  [32 %] 32 % (08/31 1349)  GENERAL: Awake, alert, laying comfortably in ICU bed in no acute distress Psych: Affect is flat. Patient is calm and cooperative with examination. Head: Normocephalic and atraumatic,  without obvious abnormality EENT: Normal conjunctivae, dry mucous membranes, no OP obstruction LUNGS: Normal respiratory effort. SpO2 98% on [place] CV: Regular rate and irregular rhythm on telemetry ABDOMEN: Soft, non-tender, non-distended Extremities: warm, well perfused, without obvious deformity  NEURO:  Mental Status: Drowsy but wakes to minimal stimulation, and oriented to self.  He is aphasic and initially seems to nod yes and no appropriately, however, this is inconsistent.  He nods yes to being in the hospital and that it is not currently winter however he also nods yes to the year being 2020 and his age in the 59's and shakes his head no to the year being 2022 and his correct age.  He is unable to provide a clear and coherent history of present illness. He follows most but not all simple commands. When asked to stick out his tongue, he does, however when asked to show 2 fingers he raises his entire hand. Speech/Language: speech is nonfluent, hypophonic, and hoarse with mild dysarthria which is baseline per family at bedside.    Naming is not intact; he is able to correctly  name clock, light, and battery but when shown a nail and box he states "more". When shown a pen he states "American" and is unable to name examiner's watch. Repetition is intact.  No neglect is noted. Cranial Nerves:  II: PERRL brisk. Visual fields full.  III, IV, VI: EOMI without ptosis or nystagmus V: Patient reports sensation is intact to light touch and symmetrical to face, though unclear if patient's report is reliable VII: Face is asymmetric resting and smiling with mild left mouth droop.  VIII: Hearing is intact to voice. IX, X: Palate elevation is symmetric. Hypophonic and hoarse speech.  XI: Normal sternocleidomastoid and trapezius muscle strength XII: Tongue protrudes midline without fasciculations.   Motor: 5/5 strength present in bilateral upper extremities, bilateral lower extremities are weak, however, his strength assessment is variable with repeat assessments depending on patient effort.  Bilateral lower extremities with 4/5 strength and with minimal vertical drift most consistently. Tone is normal. Bulk is normal.  Sensation: Intact to light touch bilaterally in all four extremities.  Coordination: Unable to assess due to patient trouble with complex commands.  DTRs: 2+ and symmetric biceps and brachioradialis, 1+ and symmetric patellae Gait: Deferred  NIHSS: 1a Level of Conscious.: 1 1b LOC Questions: 2 1c LOC Commands: 0 2 Best Gaze: 0 3 Visual: 0 4 Facial Palsy: 1 5a Motor Arm - left: 0 5b Motor Arm - Right: 0 6a Motor Leg - Left: 1 6b Motor Leg - Right: 1 7 Limb Ataxia: 0 8 Sensory: 0 9 Best Language: 2 10 Dysarthria: 1 11 Extinct. and Inatten.: 0 TOTAL: 9  Labs I have reviewed labs in epic and the results pertinent to this consultation are: CBC    Component Value Date/Time   WBC 13.6 (H) 07/02/2021 0306   RBC 4.85 07/02/2021 0306   HGB 13.4 07/02/2021 0306   HGB 14.7 01/26/2021 1124   HGB 15.7 01/17/2017 1125   HCT 42.6 07/02/2021 0306   HCT 46.5  01/17/2017 1125   PLT 250 07/02/2021 0306   PLT 226 01/26/2021 1124   PLT 202 01/17/2017 1125   MCV 87.8 07/02/2021 0306   MCV 87.1 01/17/2017 1125   MCH 27.6 07/02/2021 0306   MCHC 31.5 07/02/2021 0306   RDW 14.0 07/02/2021 0306   RDW 13.9 01/17/2017 1125   LYMPHSABS 1.1 06/28/2021 0100   LYMPHSABS 1.5 01/17/2017 1125   MONOABS  1.6 (H) 06/28/2021 0100   MONOABS 0.7 01/17/2017 1125   EOSABS 0.0 06/28/2021 0100   EOSABS 0.2 01/17/2017 1125   BASOSABS 0.0 06/28/2021 0100   BASOSABS 0.1 01/17/2017 1125   CMP     Component Value Date/Time   NA 144 07/02/2021 0306   NA 142 01/17/2017 1125   K 3.8 07/02/2021 0306   K 4.6 01/17/2017 1125   CL 111 07/02/2021 0306   CL 107 04/24/2013 1110   CO2 23 07/02/2021 0306   CO2 26 01/17/2017 1125   GLUCOSE 89 07/02/2021 0306   GLUCOSE 91 01/17/2017 1125   GLUCOSE 83 04/24/2013 1110   BUN 33 (H) 07/02/2021 0306   BUN 13.0 01/17/2017 1125   CREATININE 1.11 07/02/2021 0306   CREATININE 1.04 01/26/2021 1124   CREATININE 1.0 01/17/2017 1125   CALCIUM 8.3 (L) 07/02/2021 0306   CALCIUM 9.1 01/17/2017 1125   PROT 5.9 (L) 06/29/2021 0304   PROT 6.6 01/17/2017 1125   ALBUMIN 3.2 (L) 06/29/2021 0304   ALBUMIN 4.0 01/17/2017 1125   AST 22 06/29/2021 0304   AST 17 01/26/2021 1124   AST 15 01/17/2017 1125   ALT 18 06/29/2021 0304   ALT 20 01/26/2021 1124   ALT 18 01/17/2017 1125   ALKPHOS 38 06/29/2021 0304   ALKPHOS 59 01/17/2017 1125   BILITOT 0.7 06/29/2021 0304   BILITOT 1.4 (H) 01/26/2021 1124   BILITOT 1.57 (H) 01/17/2017 1125   GFRNONAA >60 07/02/2021 0306   GFRNONAA >60 01/26/2021 1124   GFRAA >60 01/28/2020 0914   Lipid Panel     Component Value Date/Time   CHOL 206 (H) 06/19/2014 0900   TRIG 216.0 (H) 06/19/2014 0900   HDL 38.50 (L) 06/19/2014 0900   CHOLHDL 5 06/19/2014 0900   VLDL 43.2 (H) 06/19/2014 0900   LDLDIRECT 144.0 06/19/2014 0900   No results found for: HGBA1C  Imaging I have reviewed the images  obtained:  MRI examination of the brain 07/01/2021: 1. No acute intracranial abnormality. 2. Generalized volume loss without lobar predilection.  Echocardiogram 06/30/2021:  1. Left ventricular ejection fraction, by estimation, is 45 to 50%. The left ventricle has mildly decreased function. The left ventricle demonstrates global hypokinesis. Left ventricular diastolic parameters are consistent with Grade II diastolic dysfunction (pseudonormalization). Elevated left atrial pressure.   2. Right ventricular systolic function is mildly reduced. The right ventricular size is mildly enlarged. Tricuspid regurgitation signal is inadequate for assessing PA pressure.   3. The mitral valve is normal in structure. Trivial mitral valve regurgitation.   4. The aortic valve was not well visualized. Aortic valve regurgitation is not visualized. No aortic stenosis is present.   5. The inferior vena cava is normal in size with <50% respiratory variability, suggesting right atrial pressure of 8 mmHg.   Assessment: 86 y.o. male who initially presented to the ED for evaluation of progressive shortness of breath and hypoxia requiring intubation on 8/29 with CT findings of multifocal pneumonia on antibiotic coverage for aspiration pneumonia. He was extubated 07/01/2021 and was found to be aphasic (expressive>>receptive) with dysphagia. He also appears to be confused and is unable to answer orientation questions appropriately with "yes/no" answers and with mild left mouth drooping. Per family at bedside, patient speech is normally hypophonic and hoarse but eloquent without evidence of confusion and without word-finding difficulties. MRI imaging is negative for acute intracranial abnormality. Unable to obtain MRA due to patient with severe claustrophobia. Presentation differentials include stroke not visible on MRI imaging,  less likely seizure activity, versus low suspicion for encephalopathy in the setting of pneumonia. Stroke  risk factors include hyperlipidemia, remote tobacco use, hypertension, and advanced age.   MRI-negative stroke is favored to be most likely diagnosis, and will workup and treat accordingly.  Impression: Acute aphasia; expressive > receptive Acute encephalopathy with confusion   Recommendations:  - Stroke labs: Hemoglobin A1c and fasting lipid panel - CT angio head and neck  - Routine EEG pending to r/o seizure etiology  - Frequent neuro checks - Prophylactic therapy- Antiplatelet med: continue ASA 81 mg PO daily and add plavix 75mg  daily x21 days f/b plavix 75mg  daily monotherapy (will consider current event to be an aspirin failure) - Risk factor modification - Telemetry monitoring - PT consult, OT consult, Speech consult - Goal normotention, avoid hypotension  Pt seen by NP/Neuro and later by MD. Note/plan to be edited by MD as needed.  Anibal Henderson, AGAC-NP [facility] Pager: [phone]   Neurology Attending Attestation   I examined the patient and discussed plan with Ms. Toberman NP. Above note has been edited by me to reflect my findings and recommendations.  Su Monks, MD [facility] [phone]   If 7pm- 7am, please page neurology on call as listed in [place].

## 2021-07-02 NOTE — Progress Notes (Addendum)
PROGRESS NOTE    Alvin Hardy  NAT:[id number] DOB: 09-21-1935 DOA: 06/28/2021 PCP: Crist Infante, MD    Brief Narrative:  This 86 years old male with PMH significant for depression, essential hypertension, GERD, hyperlipidemia, hypogonadism, psoriatic arthritis, BPH, lung CA (in remission, s/p lobectomy )presented in the ED with fever and shortness of breath.  He is found to have multifocal pneumonia.  He was seen by PCP and started on doxycycline without any improvement.  He was subsequently placed on Zithromax, prednisone and albuterol despite this intervention. Patient continued to have worsening shortness of breath. Patient was admitted for acute hypoxic respiratory failure secondary to multifocal pneumonia requiring intubation.  Patient was admitted in ICU and subsequently extubated. TRH  resumed care on 9/1.  Significant hospital events:  8/28 - Admitted to [facility] for multifocal PNA 8/29 - PCCM consulted for respiratory distress. Intubated with 7.5 ETT. Persistent air leak, ETT exchanged for 8.0; persistent leak, bronch completed with mild secretions. 8/30 No overnight events, on vent and antibiotics, not requiring pressors 8/31 Tolerating SBT/SAT, no overnight events, Extubated.  9/1: TRH resumed care.  Assessment & Plan:   Principal Problem:   Multifocal pneumonia Active Problems:   Dyspnea   Sepsis ([place])   History of lung cancer   Essential hypertension   Prolonged QT interval   Acute respiratory failure with hypoxia (HCC)  Acute hypoxic respiratory failure in the setting of multifocal pneumonia requiring mechanical intubation. Patient presented with worsening shortness of breath, intubated in the ED. Patient admitted in ICU,  continued on antibiotics, subsequently extubated. CTA chest ruled out PE , Multi focal pneumonia Patient is now on 2 L/m [place], sat's 94%. LVEF 45 to 42% grade 2 diastolic dysfunction on echo. Continue cefepime.  Blood cultures no growth. Respiratory  panel negative.  Strep pneumo and Legionella negative. Continue scheduled nebulization.  Acute encephalopathy could be secondary to hypoxia.: Continues to have intermittent confusion could be due to hypoxia > improving.  Facial asymmetry: Patient found to have facial asymmetry, generalized weakness. MRI no acute abnormality.  Neurology consulted awaiting recommendation.   History of lung cancer s/p right upper lobe lobectomy and radiation: Continue outpatient oncology follow-up.  Hypertension: Continue losartan, propranolol. Continue hydralazine as needed.  Hyperlipidemia: Continue statins.  Bipolar disorder: Continue lithium.  Anxiety disorder: Continue Klonopin.   DVT prophylaxis: Lovenox Code Status: Full code Family Communication: No family at bedside Disposition Plan:   Status is: Inpatient  Remains inpatient appropriate because:Inpatient level of care appropriate due to severity of illness  Dispo: The patient is from: Home              Anticipated d/c is to: Home              Patient currently is not medically stable to d/c.   Difficult to place patient No   Consultants:  Neurology PCCM  Procedures: MRI Antimicrobials:   Anti-infectives (From admission, onward)    Start     Dose/Rate Route Frequency Ordered Stop   07/01/21 1800  ceFEPIme (MAXIPIME) 2 g in sodium chloride 0.9 % 100 mL IVPB        2 g 200 mL/hr over 30 Minutes Intravenous Every 12 hours 07/01/21 0750     06/28/21 1800  vancomycin (VANCOREADY) IVPB 1750 mg/350 mL  Status:  Discontinued        1,750 mg 175 mL/hr over 120 Minutes Intravenous Every 24 hours 06/28/21 0727 06/28/21 1356   06/28/21 0700  ceFEPIme (MAXIPIME) 1 g  in sodium chloride 0.9 % 100 mL IVPB  Status:  Discontinued        1 g 200 mL/hr over 30 Minutes Intravenous Every 8 hours 06/28/21 0619 06/28/21 0644   06/28/21 0700  ceFEPIme (MAXIPIME) 2 g in sodium chloride 0.9 % 100 mL IVPB  Status:  Discontinued        2 g 200  mL/hr over 30 Minutes Intravenous Every 8 hours 06/28/21 0645 07/01/21 0750   06/28/21 0200  ceFEPIme (MAXIPIME) 2 g in sodium chloride 0.9 % 100 mL IVPB        2 g 200 mL/hr over 30 Minutes Intravenous  Once 06/28/21 0151 06/28/21 0250   06/28/21 0200  vancomycin (VANCOCIN) IVPB 1000 mg/200 mL premix        1,000 mg 200 mL/hr over 60 Minutes Intravenous  Once 06/28/21 0151 06/28/21 0345        Subjective: Patient was seen and examined at bedside.  Overnight events noted.   Patient seems confused, has noted facial asymmetry and generalized weakness.   Patient is s/p extubation 8/31.  Objective: Vitals:   07/02/21 0900 07/02/21 1000 07/02/21 1200 07/02/21 1208  BP: (!) 166/64 (!) 177/68 (!) 155/73   Pulse: 78 95 76   Resp: (!) 24 (!) 26 (!) 26   Temp:    98.4 F (36.9 C)  TempSrc:    Oral  SpO2: 94% 100% 94%   Weight:      Height:        Intake/Output Summary (Last 24 hours) at 07/02/2021 1335 Last data filed at 07/02/2021 1002 Gross per 24 hour  Intake 244.76 ml  Output 1775 ml  Net -1530.24 ml   Filed Weights   06/30/21 0800  Weight: 86.9 kg    Examination:  General exam: Appears comfortable, not in any distress, facial asymmetry noted. Respiratory system: Clear to auscultation bilaterally, no wheezing. Cardiovascular system: S1-S2 heard, regular rate and rhythm, no murmur.   Gastrointestinal system: Abdomen is nondistended, soft and nontender.  Normal bowel sounds heard. Central nervous system: Alert, seems confused.  Generalized weakness noted. Extremities: No edema, no cyanosis, no clubbing. Skin: No rashes, lesions or ulcers Psychiatry: Mood and affect appropriate.    Data Reviewed: I have personally reviewed following labs and imaging studies  CBC: Recent Labs  Lab 06/28/21 0100 06/29/21 0304 06/30/21 0249 07/01/21 0245 07/02/21 0306  WBC 15.1* 12.4* 13.4* 13.0* 13.6*  NEUTROABS 12.3*  --   --   --   --   HGB 14.5 13.1 14.2 14.4 13.4  HCT 45.4  42.5 47.5 47.2 42.6  MCV 89.5 90.6 94.2 90.6 87.8  PLT 207 194 221 235 371   Basic Metabolic Panel: Recent Labs  Lab 06/28/21 0100 06/29/21 0304 06/30/21 0249 07/01/21 0245 07/02/21 0306  NA 136 140 140 141 144  K 3.6 4.3 4.9 4.2 3.8  CL 103 108 108 109 111  CO2 22 26 26 22 23   GLUCOSE 173* 115* 97 121* 89  BUN 21 16 28* 38* 33*  CREATININE 0.92 0.93 1.04 1.15 1.11  CALCIUM 9.1 8.5* 8.6* 8.5* 8.3*  MG  --   --  2.5* 2.4  --   PHOS  --   --  3.3 3.4  --    GFR: Estimated Creatinine Clearance: 51.2 mL/min (by C-G formula based on SCr of 1.11 mg/dL). Liver Function Tests: Recent Labs  Lab 06/28/21 0100 06/29/21 0304  AST 28 22  ALT 20 18  ALKPHOS 47  38  BILITOT 1.1 0.7  PROT 6.8 5.9*  ALBUMIN 3.9 3.2*   No results for input(s): LIPASE, AMYLASE in the last 168 hours. No results for input(s): AMMONIA in the last 168 hours. Coagulation Profile: Recent Labs  Lab 06/28/21 0100  INR 1.1   Cardiac Enzymes: No results for input(s): CKTOTAL, CKMB, CKMBINDEX, TROPONINI in the last 168 hours. BNP (last 3 results) No results for input(s): PROBNP in the last 8760 hours. HbA1C: No results for input(s): HGBA1C in the last 72 hours. CBG: Recent Labs  Lab 07/01/21 2015 07/01/21 2325 07/02/21 0353 07/02/21 0810  GLUCAP 100* 101* 90 103*   Lipid Profile: No results for input(s): CHOL, HDL, LDLCALC, TRIG, CHOLHDL, LDLDIRECT in the last 72 hours. Thyroid Function Tests: No results for input(s): TSH, T4TOTAL, FREET4, T3FREE, THYROIDAB in the last 72 hours. Anemia Panel: No results for input(s): VITAMINB12, FOLATE, FERRITIN, TIBC, IRON, RETICCTPCT in the last 72 hours. Sepsis Labs: Recent Labs  Lab 06/28/21 0420 06/28/21 0630 06/28/21 0844 06/29/21 0936  LATICACIDVEN 4.7* 4.8* 2.6* 1.3    Recent Results (from the past 240 hour(s))  Resp Panel by RT-PCR (Flu A&B, Covid) Nasopharyngeal Swab     Status: None   Collection Time: 06/28/21  1:00 AM   Specimen:  Nasopharyngeal Swab; Nasopharyngeal(NP) swabs in vial transport medium  Result Value Ref Range Status   SARS Coronavirus 2 by RT PCR NEGATIVE NEGATIVE Final    Comment: (NOTE) SARS-CoV-2 target nucleic acids are NOT DETECTED.  The SARS-CoV-2 RNA is generally detectable in upper respiratory specimens during the acute phase of infection. The lowest concentration of SARS-CoV-2 viral copies this assay can detect is 138 copies/mL. A negative result does not preclude SARS-Cov-2 infection and should not be used as the sole basis for treatment or other patient management decisions. A negative result may occur with  improper specimen collection/handling, submission of specimen other than nasopharyngeal swab, presence of viral mutation(s) within the areas targeted by this assay, and inadequate number of viral copies(<138 copies/mL). A negative result must be combined with clinical observations, patient history, and epidemiological information. The expected result is Negative.  Fact Sheet for Patients:  EntrepreneurPulse.com.au  Fact Sheet for Healthcare Providers:  IncredibleEmployment.be  This test is no t yet approved or cleared by the [place] FDA and  has been authorized for detection and/or diagnosis of SARS-CoV-2 by FDA under an Emergency Use Authorization (EUA). This EUA will remain  in effect (meaning this test can be used) for the duration of the COVID-19 declaration under Section 564(b)(1) of the Act, 21 U.S.C.section 360bbb-3(b)(1), unless the authorization is terminated  or revoked sooner.       Influenza A by PCR NEGATIVE NEGATIVE Final   Influenza B by PCR NEGATIVE NEGATIVE Final    Comment: (NOTE) The Xpert Xpress SARS-CoV-2/FLU/RSV plus assay is intended as an aid in the diagnosis of influenza from Nasopharyngeal swab specimens and should not be used as a sole basis for treatment. Nasal washings and aspirates are unacceptable for  Xpert Xpress SARS-CoV-2/FLU/RSV testing.  Fact Sheet for Patients: EntrepreneurPulse.com.au  Fact Sheet for Healthcare Providers: IncredibleEmployment.be  This test is not yet approved or cleared by the [place] FDA and has been authorized for detection and/or diagnosis of SARS-CoV-2 by FDA under an Emergency Use Authorization (EUA). This EUA will remain in effect (meaning this test can be used) for the duration of the COVID-19 declaration under Section 564(b)(1) of the Act, 21 U.S.C. section 360bbb-3(b)(1), unless the authorization is  terminated or revoked.  Performed at [facility], [place] [address], [place], [place] [address]   Blood Culture (routine x 2)     Status: None (Preliminary result)   Collection Time: 06/28/21  1:00 AM   Specimen: BLOOD  Result Value Ref Range Status   Specimen Description   Final    BLOOD BLOOD LEFT ARM Performed at [place] [address] [place], [place] [address]    Special Requests   Final    BOTTLES DRAWN AEROBIC AND ANAEROBIC Blood Culture adequate volume Performed at [place] [address], [place], [place] [address]    Culture   Final    NO GROWTH 4 DAYS Performed at [facility], [place] [address] [place], [place] [address]    Report Status PENDING  Incomplete  Blood Culture (routine x 2)     Status: None (Preliminary result)   Collection Time: 06/28/21  1:00 AM   Specimen: BLOOD  Result Value Ref Range Status   Specimen Description   Final    BLOOD BLOOD RIGHT ARM Performed at [facility] [place] [address] [place], [place] [address]    Special Requests   Final    BOTTLES DRAWN AEROBIC AND ANAEROBIC Blood Culture adequate volume Performed at [facility] [address] [place], [place]  [address]    Culture   Final    NO GROWTH 4 DAYS Performed at [facility], [place] [address], [place], [place] [address]    Report Status PENDING  Incomplete  Urine Culture     Status: Abnormal   Collection Time: 06/28/21  4:50 AM   Specimen: In/Out Cath Urine  Result Value Ref Range Status   Specimen Description   Final    IN/OUT CATH URINE Performed at [place] [address], [place], [place] [address]    Special Requests   Final    NONE Performed at [facility], [place] [address], [place], [place] [address]    Culture (A)  Final    <10,000 COLONIES/mL INSIGNIFICANT GROWTH Performed at [place] [address], [place],  [address]    Report Status 06/29/2021 FINAL  Final  MRSA Next Gen by PCR, Nasal     Status: None   Collection Time: 06/28/21  8:23 AM   Specimen: Nasal Mucosa; Nasal Swab  Result Value Ref Range Status   MRSA by PCR Next Gen NOT DETECTED NOT DETECTED Final    Comment: (NOTE) The GeneXpert MRSA Assay (FDA approved for NASAL specimens only), is one component of a comprehensive MRSA colonization surveillance program. It is not intended to diagnose MRSA infection nor to guide or monitor treatment for MRSA infections. Test performance is not FDA approved in patients less than 79 years old. Performed at [facility], [place] [address], [place],  [address]   Respiratory (~20 pathogens) panel by PCR     Status: None   Collection Time: 06/29/21  1:24 PM   Specimen: Nasopharyngeal Swab; Respiratory  Result Value Ref Range Status   Adenovirus NOT DETECTED NOT DETECTED Final   Coronavirus 229E NOT DETECTED NOT DETECTED Final    Comment: (NOTE) The Coronavirus on the Respiratory Panel, DOES NOT test for the novel  Coronavirus (2019 nCoV)    Coronavirus HKU1 NOT DETECTED NOT DETECTED Final   Coronavirus NL63 NOT DETECTED NOT DETECTED Final   Coronavirus OC43 NOT DETECTED  NOT DETECTED Final   Metapneumovirus NOT DETECTED NOT DETECTED Final   Rhinovirus / Enterovirus NOT  DETECTED NOT DETECTED Final   Influenza A NOT DETECTED NOT DETECTED Final   Influenza B NOT DETECTED NOT DETECTED Final   Parainfluenza Virus 1 NOT DETECTED NOT DETECTED Final   Parainfluenza Virus 2 NOT DETECTED NOT DETECTED Final   Parainfluenza Virus 3 NOT DETECTED NOT DETECTED Final   Parainfluenza Virus 4 NOT DETECTED NOT DETECTED Final   Respiratory Syncytial Virus NOT DETECTED NOT DETECTED Final   Bordetella pertussis NOT DETECTED NOT DETECTED Final   Bordetella Parapertussis NOT DETECTED NOT DETECTED Final   Chlamydophila pneumoniae NOT DETECTED NOT DETECTED Final   Mycoplasma pneumoniae NOT DETECTED NOT DETECTED Final    Comment: Performed at [facility], [place] [address] [place], [place] [address]  Culture, Respiratory w Gram Stain     Status: None (Preliminary result)   Collection Time: 06/30/21  4:15 PM   Specimen: Tracheal Aspirate; Respiratory  Result Value Ref Range Status   Specimen Description   Final    TRACHEAL ASPIRATE Performed at [facility] [address], [place], [place] [address]    Special Requests   Final    NONE Performed at [facility], [place] [address] [place], [place] [address]    Gram Stain   Final    FEW SQUAMOUS EPITHELIAL CELLS PRESENT FEW WBC SEEN FEW GRAM POSITIVE COCCI FEW GRAM NEGATIVE RODS FEW GRAM POSITIVE RODS    Culture   Final    FEW Normal respiratory flora-no Staph aureus or Pseudomonas seen Performed at [facility], [address], [place], [place] [address]    Report Status PENDING  Incomplete    Radiology Studies: MR BRAIN WO CONTRAST  Result Date: 07/01/2021 CLINICAL DATA:  Encephalopathy EXAM: MRI HEAD WITHOUT CONTRAST TECHNIQUE: Multiplanar, multiecho pulse sequences of the brain and surrounding structures were obtained without intravenous contrast. COMPARISON:  02/20/2013 FINDINGS: Brain: No acute infarct, mass effect or extra-axial collection. No acute or chronic  hemorrhage. Generalized diffuse volume loss. Parenchymal signal is normal. No hydrocephalus. The midline structures are normal. Vascular: Major flow voids are preserved. Skull and upper cervical spine: Normal calvarium and skull base. Visualized upper cervical spine and soft tissues are normal. Sinuses/Orbits:No paranasal sinus fluid levels or advanced mucosal thickening. No mastoid or middle ear effusion. Normal orbits. IMPRESSION: 1. No acute intracranial abnormality. 2. Generalized volume loss without lobar predilection. Electronically Signed   By: Ulyses Jarred M.D.   On: 07/01/2021 19:20   ECHOCARDIOGRAM COMPLETE  Result Date: 06/30/2021    ECHOCARDIOGRAM REPORT   Patient Name:   LIONEL WOODBERRY [facility] Date of Exam: 06/30/2021 Medical Rec #:  [id number]       Height:       70.0 in Accession #:    [id number]      Weight:       191.6 lb Date of Birth:  31-Dec-1934       BSA:          2.050 m Patient Age:    28 years        BP:           139/86 mmHg Patient Gender: M               HR:           56 bpm. Exam Location:  Inpatient Procedure: 2D Echo, Cardiac Doppler and Color Doppler Indications:  Acute Respiratory Distress  History:        Patient has prior history of Echocardiogram examinations, most                 recent 02/18/2015. Signs/Symptoms:Shortness of Breath and Chest                 Pain; Risk Factors:Hypertension, Dyslipidemia and Diabetes.  Sonographer:    Bernadene Person RDCS Referring Phys: [id number] [place]  1. Left ventricular ejection fraction, by estimation, is 45 to 50%. The left ventricle has mildly decreased function. The left ventricle demonstrates global hypokinesis. Left ventricular diastolic parameters are consistent with Grade II diastolic dysfunction (pseudonormalization). Elevated left atrial pressure.  2. Right ventricular systolic function is mildly reduced. The right ventricular size is mildly enlarged. Tricuspid regurgitation signal is inadequate for assessing PA  pressure.  3. The mitral valve is normal in structure. Trivial mitral valve regurgitation.  4. The aortic valve was not well visualized. Aortic valve regurgitation is not visualized. No aortic stenosis is present.  5. The inferior vena cava is normal in size with <50% respiratory variability, suggesting right atrial pressure of 8 mmHg. FINDINGS  Left Ventricle: Left ventricular ejection fraction, by estimation, is 45 to 50%. The left ventricle has mildly decreased function. The left ventricle demonstrates global hypokinesis. The left ventricular internal cavity size was normal in size. There is  no left ventricular hypertrophy. Left ventricular diastolic parameters are consistent with Grade II diastolic dysfunction (pseudonormalization). Elevated left atrial pressure. Right Ventricle: The right ventricular size is mildly enlarged. No increase in right ventricular wall thickness. Right ventricular systolic function is mildly reduced. Tricuspid regurgitation signal is inadequate for assessing PA pressure. Left Atrium: Left atrial size was normal in size. Right Atrium: Right atrial size was normal in size. Pericardium: There is no evidence of pericardial effusion. Mitral Valve: The mitral valve is normal in structure. Trivial mitral valve regurgitation. Tricuspid Valve: The tricuspid valve is normal in structure. Tricuspid valve regurgitation is not demonstrated. Aortic Valve: The aortic valve was not well visualized. Aortic valve regurgitation is not visualized. No aortic stenosis is present. Pulmonic Valve: The pulmonic valve was not well visualized. Pulmonic valve regurgitation is not visualized. Aorta: The aortic root was not well visualized. Venous: The inferior vena cava is normal in size with less than 50% respiratory variability, suggesting right atrial pressure of 8 mmHg. IAS/Shunts: No atrial level shunt detected by color flow Doppler.  LEFT VENTRICLE PLAX 2D LVIDd:         5.00 cm      Diastology LVIDs:          4.10 cm      LV e' medial:    4.05 cm/s LV PW:         1.00 cm      LV E/e' medial:  20.6 LV IVS:        1.20 cm      LV e' lateral:   5.57 cm/s                             LV E/e' lateral: 15.0  LV Volumes (MOD) LV vol d, MOD A2C: 74.3 ml LV vol d, MOD A4C: 121.0 ml LV vol s, MOD A2C: 44.8 ml LV vol s, MOD A4C: 50.4 ml LV SV MOD A2C:     29.5 ml LV SV MOD A4C:     121.0 ml LV SV  MOD BP:      50.3 ml RIGHT VENTRICLE RV S prime:     10.30 cm/s TAPSE (M-mode): 1.5 cm LEFT ATRIUM             Index       RIGHT ATRIUM           Index LA diam:        3.60 cm 1.76 cm/m  RA Area:     14.00 cm LA Vol (A2C):   30.2 ml 14.74 ml/m RA Volume:   28.30 ml  13.81 ml/m LA Vol (A4C):   29.3 ml 14.30 ml/m LA Biplane Vol: 31.2 ml 15.22 ml/m  AORTIC VALVE LVOT Vmax:   88.90 cm/s LVOT Vmean:  58.900 cm/s LVOT VTI:    0.194 m  AORTA Ao Asc diam: 3.30 cm MITRAL VALVE MV Area (PHT): 3.85 cm    SHUNTS MV Decel Time: 197 msec    Systemic VTI: 0.19 m MV E velocity: 83.30 cm/s MV A velocity: 72.10 cm/s MV E/A ratio:  1.16 Mihai Croitoru MD Electronically signed by Sanda Klein MD Signature Date/Time: 06/30/2021/5:27:09 PM    Final     Scheduled Meds:  aspirin  81 mg Oral Daily   Chlorhexidine Gluconate Cloth  6 each Topical Daily   clonazePAM  0.5 mg Oral QHS   enoxaparin (LOVENOX) injection  40 mg Subcutaneous Daily   gabapentin  600 mg Oral Daily   And   gabapentin  900 mg Oral QHS   ipratropium-albuterol  3 mL Nebulization Q6H   lithium carbonate  300 mg Oral Q12H   losartan  50 mg Oral Daily   mouth rinse  15 mL Mouth Rinse BID   pantoprazole (PROTONIX) IV  40 mg Intravenous Q24H   propranolol  10 mg Oral Daily   Continuous Infusions:  sodium chloride Stopped (07/01/21 1801)   ceFEPime (MAXIPIME) IV Stopped (07/02/21 0626)     LOS: 4 days    Time spent: 35 mins    Carl Butner, MD [facility]   If 7PM-7AM, please contact night-coverage

## 2021-07-03 DIAGNOSIS — R9431 Abnormal electrocardiogram [ECG] [EKG]: Secondary | ICD-10-CM

## 2021-07-03 DIAGNOSIS — A419 Sepsis, unspecified organism: Principal | ICD-10-CM

## 2021-07-03 DIAGNOSIS — R4182 Altered mental status, unspecified: Secondary | ICD-10-CM

## 2021-07-03 LAB — CULTURE, RESPIRATORY W GRAM STAIN: Culture: NORMAL

## 2021-07-03 LAB — CBC
HCT: 46.4 % (ref 39.0–52.0)
Hemoglobin: 14.6 g/dL (ref 13.0–17.0)
MCH: 27.7 pg (ref 26.0–34.0)
MCHC: 31.5 g/dL (ref 30.0–36.0)
MCV: 87.9 fL (ref 80.0–100.0)
Platelets: 315 10*3/uL (ref 150–400)
RBC: 5.28 MIL/uL (ref 4.22–5.81)
RDW: 14.4 % (ref 11.5–15.5)
WBC: 17.4 10*3/uL — ABNORMAL HIGH (ref 4.0–10.5)
nRBC: 0 % (ref 0.0–0.2)

## 2021-07-03 LAB — GLUCOSE, CAPILLARY: Glucose-Capillary: 175 mg/dL — ABNORMAL HIGH (ref 70–99)

## 2021-07-03 LAB — BASIC METABOLIC PANEL
Anion gap: 10 (ref 5–15)
BUN: 38 mg/dL — ABNORMAL HIGH (ref 8–23)
CO2: 24 mmol/L (ref 22–32)
Calcium: 8.2 mg/dL — ABNORMAL LOW (ref 8.9–10.3)
Chloride: 110 mmol/L (ref 98–111)
Creatinine, Ser: 1.18 mg/dL (ref 0.61–1.24)
GFR, Estimated: >60 mL/min (ref >60)
Glucose, Bld: 141 mg/dL — ABNORMAL HIGH (ref 70–99)
Potassium: 3.7 mmol/L (ref 3.5–5.1)
Sodium: 144 mmol/L (ref 135–145)

## 2021-07-03 LAB — CULTURE, BLOOD (ROUTINE X 2)
Culture: NO GROWTH
Culture: NO GROWTH
Special Requests: ADEQUATE
Special Requests: ADEQUATE

## 2021-07-03 MED ORDER — ONDANSETRON HCL 4 MG/2ML IJ SOLN
4.0000 mg | Freq: Four times a day (QID) | INTRAMUSCULAR | Status: DC | PRN
  Administered 2021-07-03 – 2021-07-04 (×2): 4 mg via INTRAVENOUS
  Filled 2021-07-03 (×2): qty 2

## 2021-07-03 MED ORDER — ALBUTEROL SULFATE (2.5 MG/3ML) 0.083% IN NEBU
2.5000 mg | INHALATION_SOLUTION | RESPIRATORY_TRACT | Status: DC | PRN
  Administered 2021-07-06: 2.5 mg via RESPIRATORY_TRACT
  Filled 2021-07-03 (×2): qty 3

## 2021-07-03 MED ORDER — AZITHROMYCIN 250 MG PO TABS
500.0000 mg | ORAL_TABLET | Freq: Every day | ORAL | Status: DC
  Administered 2021-07-03 – 2021-07-04 (×2): 500 mg via ORAL
  Filled 2021-07-03 (×2): qty 2

## 2021-07-03 MED ORDER — MOMETASONE FURO-FORMOTEROL FUM 200-5 MCG/ACT IN AERO
2.0000 | INHALATION_SPRAY | Freq: Two times a day (BID) | RESPIRATORY_TRACT | Status: DC
  Administered 2021-07-04 – 2021-07-08 (×10): 2 via RESPIRATORY_TRACT
  Filled 2021-07-03: qty 8.8

## 2021-07-03 MED ORDER — CEFTRIAXONE SODIUM 1 G IJ SOLR
1.0000 g | INTRAMUSCULAR | Status: AC
  Administered 2021-07-03 – 2021-07-04 (×2): 1 g via INTRAVENOUS
  Filled 2021-07-03: qty 10
  Filled 2021-07-03: qty 1

## 2021-07-03 MED ORDER — METHYLPREDNISOLONE SODIUM SUCC 40 MG IJ SOLR
40.0000 mg | Freq: Every day | INTRAMUSCULAR | Status: DC
  Administered 2021-07-03 – 2021-07-04 (×2): 40 mg via INTRAVENOUS
  Filled 2021-07-03 (×2): qty 1

## 2021-07-03 NOTE — Progress Notes (Signed)
PROGRESS NOTE    MALIKYE REPPOND  QBV:[id number] DOB: October 02, 1935 DOA: 06/28/2021 PCP: Crist Infante, MD    Brief Narrative:  Mr Griffie was admitted to the hospital with the working diagnosis of acute hypoxemic respiratory failure due to multifocal pneumonia, complicated with severe sepsis.  86 year old male past medical history for lung cancer s/p lobectomy, GERD, Kittelberger syndrome who presented with dyspnea and fever.  Reported progressive dyspnea over 7 days, he received doxycycline, azithromycin and prednisone as an outpatient with no major improvement of his symptoms.  Positive dry cough and wheezing.  On his initial physical examination he was febrile 102.6 degrees Fahrenheit, blood pressure 124/63, heart rate 118, respiratory rate 33, oxygen saturation 96%, his lungs have decreased breath sounds, diffuse rales and rhonchi, heart S1-S2, present, regular abdomen was soft nondistended, no lower extremity edema.  Sodium 136, potassium 3.6, chloride 103, bicarb 22, glucose 173, BUN 21, creatinine 0.92, lactic acid 2.2-4.7-4.8-2.6, white count 15.1, hemoglobin 14.5, hematocrit 45.4, platelets 207. SARS COVID-19 negative.  Urinalysis specific gravity 1.015, negative nitrates.  Chest radiograph with faint interstitial infiltrate right lower lobe and left lower lobe.  CT chest with no acute pulmonary embolism, multifocal lower lobe peribronchial and peripheral pulmonary opacities.  Chronic right apical ground glass opacity.  EKG 135 bpm, rightward axis, right bundle branch block, QTC 545, sinus rhythm, no significant ST segment or T wave changes.   Patient was placed on intravenous fluids and IV antibiotic therapy.  8/29 patient developed respiratory distress, requiring advanced airway and mechanical ventilation.  8/31 extubated.  He was noted to have acute expressive aphasia, encephalopathy and confusion. Further work-up with brain MRI no acute changes. Electroencephalography with  diffuse encephalopathy, no seizures.   Transfer to [facility] on 09/01   Assessment & Plan:   Principal Problem:   Multifocal pneumonia Active Problems:   Dyspnea   Sepsis ([place])   History of lung cancer   Essential hypertension   Prolonged QT interval   Acute respiratory failure with hypoxia (HCC)   Acute hypoxemic respiratory failure due to multifocal pneumonia/ severe sepsis (present on admission). Patient on nasal cannula 2L min with oxygenation at 95%, RR is 25, positive rhonchi and wheezing. Cultures continue with no growth.   Doubt multiresistant organism, will change antibiotic therapy to ceftriaxone and azithromycin. Continue oxymetry monitoring and aspiration precautions. On dysphagia one diet.  Positive wheezing in the setting of tobacco abuse in the past, possible COPD exacerbation component will add methylprednisolone 40 mg daily for now. Continue with bronchodilator therapy and add dulera.   Out of bed to chair tid with meals as tolerated, follow with PT and OT.  Ok to transfer to progressive care.   2. Acute metabolic encephalopathy. Patient more awake and alert, non focal, CVA has been ruled out with negative brain MRI. EEG with no seizures. Continue close neuro checks, and aspiration precautions.  Today with no agitation.   3. HTN/ dyslipidemia Continue blood pressure control with losartan and propranolol.  Continue with statin therapy.   4. Anxiety, bipolar continue with lithium Clonazepam at night.   5. Hx of lung cancer sp right upper lobe lobectomy and radiation in the past.  Follow as outpatient.    Patient continue to be at high risk for worsening respiratory failure   Status is: Inpatient  Remains inpatient appropriate because:Inpatient level of care appropriate due to severity of illness  Dispo: The patient is from: Home  Anticipated d/c is to: Home              Patient currently is not medically stable to d/c.   Difficult to place  patient No   DVT prophylaxis: Enoxaparin   Code Status:   full  Family Communication:  I spoke over the phone with the patient's wife about patient's  condition, plan of care, prognosis and all questions were addressed.     Nutrition Status: Nutrition Problem: Increased nutrient needs Etiology: acute illness Signs/Symptoms: estimated needs Interventions: Refer to RD note for recommendations   Antimicrobials:  Ceftriaxone and azithromycin     Subjective: Patient is awake and alert, reports improvement in dyspnea but not yet back to baseline, no chest pain, no nausea or vomiting.   Objective: Vitals:   07/03/21 0900 07/03/21 1000 07/03/21 1100 07/03/21 1200  BP: (!) 166/70 (!) 167/75 (!) 158/75 (!) 147/74  Pulse: 92 92 82 79  Resp: (!) 28 (!) 27 (!) 22 (!) 25  Temp:    98.9 F (37.2 C)  TempSrc:    Oral  SpO2: 93% 92% 95% 95%  Weight:      Height:        Intake/Output Summary (Last 24 hours) at 07/03/2021 1335 Last data filed at 07/03/2021 0600 Gross per 24 hour  Intake 236.92 ml  Output 985 ml  Net -748.08 ml   Filed Weights   06/30/21 0800  Weight: 86.9 kg    Examination:   General: Not in pain or dyspnea, deconditioned  Neurology: Awake and alert, non focal  E ENT: mild pallor, no icterus, oral mucosa moist Cardiovascular: No JVD. S1-S2 present, rhythmic, no gallops, rubs, or murmurs. No lower extremity edema. Pulmonary: positive breath sounds bilaterally, decreased air movement, positive expiratory wheezing, bilateral rhonchi but no rales. Gastrointestinal. Abdomen soft and non tender Skin. No rashes Musculoskeletal: no joint deformities     Data Reviewed: I have personally reviewed following labs and imaging studies  CBC: Recent Labs  Lab 06/28/21 0100 06/29/21 0304 06/30/21 0249 07/01/21 0245 07/02/21 0306 07/03/21 0254  WBC 15.1* 12.4* 13.4* 13.0* 13.6* 17.4*  NEUTROABS 12.3*  --   --   --   --   --   HGB 14.5 13.1 14.2 14.4 13.4 14.6  HCT  45.4 42.5 47.5 47.2 42.6 46.4  MCV 89.5 90.6 94.2 90.6 87.8 87.9  PLT 207 194 221 235 250 681   Basic Metabolic Panel: Recent Labs  Lab 06/29/21 0304 06/30/21 0249 07/01/21 0245 07/02/21 0306 07/03/21 0254  NA 140 140 141 144 144  K 4.3 4.9 4.2 3.8 3.7  CL 108 108 109 111 110  CO2 26 26 22 23 24   GLUCOSE 115* 97 121* 89 141*  BUN 16 28* 38* 33* 38*  CREATININE 0.93 1.04 1.15 1.11 1.18  CALCIUM 8.5* 8.6* 8.5* 8.3* 8.2*  MG  --  2.5* 2.4  --   --   PHOS  --  3.3 3.4  --   --    GFR: Estimated Creatinine Clearance: 48.1 mL/min (by C-G formula based on SCr of 1.18 mg/dL). Liver Function Tests: Recent Labs  Lab 06/28/21 0100 06/29/21 0304  AST 28 22  ALT 20 18  ALKPHOS 47 38  BILITOT 1.1 0.7  PROT 6.8 5.9*  ALBUMIN 3.9 3.2*   No results for input(s): LIPASE, AMYLASE in the last 168 hours. No results for input(s): AMMONIA in the last 168 hours. Coagulation Profile: Recent Labs  Lab 06/28/21 0100  INR  1.1   Cardiac Enzymes: No results for input(s): CKTOTAL, CKMB, CKMBINDEX, TROPONINI in the last 168 hours. BNP (last 3 results) No results for input(s): PROBNP in the last 8760 hours. HbA1C: Recent Labs    07/02/21 1758  HGBA1C 5.7*   CBG: Recent Labs  Lab 07/01/21 2015 07/01/21 2325 07/02/21 0353 07/02/21 0810  GLUCAP 100* 101* 90 103*   Lipid Profile: Recent Labs    07/02/21 1758  LDLDIRECT 32.8   Thyroid Function Tests: No results for input(s): TSH, T4TOTAL, FREET4, T3FREE, THYROIDAB in the last 72 hours. Anemia Panel: No results for input(s): VITAMINB12, FOLATE, FERRITIN, TIBC, IRON, RETICCTPCT in the last 72 hours.    Radiology Studies: I have reviewed all of the imaging during this hospital visit personally     Scheduled Meds:  aspirin  81 mg Oral Daily   Chlorhexidine Gluconate Cloth  6 each Topical Daily   clonazePAM  0.5 mg Oral QHS   clopidogrel  75 mg Oral Daily   enoxaparin (LOVENOX) injection  40 mg Subcutaneous Daily    gabapentin  600 mg Oral Daily   And   gabapentin  900 mg Oral QHS   ipratropium-albuterol  3 mL Nebulization Q6H   lithium carbonate  300 mg Oral Q12H   losartan  50 mg Oral Daily   mouth rinse  15 mL Mouth Rinse BID   pantoprazole (PROTONIX) IV  40 mg Intravenous Q24H   propranolol  10 mg Oral Daily   Continuous Infusions:  sodium chloride Stopped (07/01/21 1801)   ceFEPime (MAXIPIME) IV Stopped (07/03/21 0551)     LOS: 5 days        Alisah Grandberry Gerome Apley, MD

## 2021-07-03 NOTE — Progress Notes (Signed)
EEG complete - results pending 

## 2021-07-03 NOTE — Procedures (Signed)
Patient Name: Alvin Hardy  MRN: [id number]  Epilepsy Attending: Lora Havens  Referring Physician/Provider: Dr Margaretha Seeds Date: 07/03/2021 Duration: 25.32 mins  Patient history: 86 year old male with speech disturbance as well as altered mental status.  EEG to evaluate for seizures.  Level of alertness: Awake, asleep  AEDs during EEG study: GBP, Clonazepam  Technical aspects: This EEG study was done with scalp electrodes positioned according to the 10-20 International system of electrode placement. Electrical activity was acquired at a sampling rate of 500Hz  and reviewed with a high frequency filter of 70Hz  and a low frequency filter of 1Hz . EEG data were recorded continuously and digitally stored.   Description: No clear posterior dominant rhythm was seen. Sleep was characterized by sleep spindles (12 to 14 Hz), maximal frontocentral region.  EEG showed continuous generalized 5 to 6 Hz theta-delta slowing as well as intermittent generalized 2 to 3 Hz delta slowing.  Hyperventilation and photic stimulation were not performed.     ABNORMALITY - Continuous slow, generalized  IMPRESSION: This study is suggestive of moderate diffuse encephalopathy, nonspecific etiology. No seizures or epileptiform discharges were seen throughout the recording.  Alvin Hardy Alvin Hardy

## 2021-07-03 NOTE — Progress Notes (Signed)
  Speech Language Pathology Treatment: Dysphagia  Patient Details Name: Alvin Hardy MRN: [id number] DOB: [birth year] Today's Date: 07/03/2021 Time: 4270-6237 SLP Time Calculation (min) (ACUTE ONLY): 25 min  Assessment / Plan / Recommendation Clinical Impression  Pt did not form suction on straw - with several opportunities - even with stimulated with straw tip of liquid.  He did elicit swallow when given tsp and cup boluses but needs total cues.  Swallow continues to be orally delayed with pt demonstrating wheeze with minimal intake.  SLP uncertain if pt will obtain adequate nutrition via po given current dysphagia, respiratory status and fatigue.    Recommend to encourage liquid nutrition as much as he will tolerate.  SLP assisted RN in delivering pt's medications - one at a time - small with ice cream whole and capsules opened in icecream.  He tolerated this well - with cough x2 post=swallow but no indication of respiratory issues.  Of note, during MBS, pt did cough without barium visualized in larynx/trachea.  Pt also wincing with swallowing -finally admitting pain with direct question cue, which may be further exacerbating his dysphagia.    SLP advises to continue conservative diet and strict precautions.  Of note, after completion of medication intake, pt did cough and expectorate viscous nickel sized amount of secretions.  Uncertain to his ability to follow directions such as cough strongly, expectorate - but recommend continue to provide verbal cues for airway protection.    Pt communicates better with verbal choice of 2 due to his expressive aphasia.  Pt appears with flat affect and ongoing left facial asymmetry.  Educated pt to recommendations using written instructions and verbal review.    HPI HPI: Pt is an 86 yo male adm to [facility] with respiratory deficits- requiring intubation.  [name] s s/p intubation x3 days - 8/29-8/31.  Pt failed yale six hours post extubation, asp pna, asp pneumonitis,  h/o reflux, h/o LPR, 86 year old man who presented to [facility] ED 8/28 for SOB and fever. Found to have multifocal PNA. PMHx significant for lung CA (in remission, s/p lobectomy), HTN, HLD, GERD, Gilbert's syndrome, anxiety, bipolar disorder.     Initially seen by PCP a few days prior to admission, placed on doxycycline without improvement; he was subsequently placed on azithromycin and prednisone and Decadron x 1 and albuterol were administered in the office per RD notes. Pt is s/p MBS and was placed on conservative diet due to his dysphagia/respiratory status.      SLP Plan          Recommendations  Diet recommendations: Dysphagia 1 (puree);Nectar-thick liquid;Thin liquid Liquids provided via: No straw;Teaspoon Medication Administration: Crushed with puree (1 at a time, whole ok if small  with ice cream) Compensations: Slow rate;Small sips/bites (cue to cough if indicated) Postural Changes and/or Swallow Maneuvers: Upright 30-60 min after meal;Seated upright 90 degrees                Follow up Recommendations: Skilled Nursing facility SLP Visit Diagnosis: Dysphagia, oral phase (R13.11)       GO              Alvin Lime, MS [facility] SLP Acute Rehab Services Office [phone] Pager [phone]   Macario Golds 07/03/2021, 10:11 AM

## 2021-07-04 ENCOUNTER — Inpatient Hospital Stay (HOSPITAL_COMMUNITY): Payer: Medicare Other

## 2021-07-04 LAB — CBC WITH DIFFERENTIAL/PLATELET
Abs Immature Granulocytes: 0.88 10*3/uL — ABNORMAL HIGH (ref 0.00–0.07)
Basophils Absolute: 0.2 10*3/uL — ABNORMAL HIGH (ref 0.0–0.1)
Basophils Relative: 1 %
Eosinophils Absolute: 0 10*3/uL (ref 0.0–0.5)
Eosinophils Relative: 0 %
HCT: 49.2 % (ref 39.0–52.0)
Hemoglobin: 15.7 g/dL (ref 13.0–17.0)
Immature Granulocytes: 5 %
Lymphocytes Relative: 5 %
Lymphs Abs: 0.9 10*3/uL (ref 0.7–4.0)
MCH: 28 pg (ref 26.0–34.0)
MCHC: 31.9 g/dL (ref 30.0–36.0)
MCV: 87.9 fL (ref 80.0–100.0)
Monocytes Absolute: 1.1 10*3/uL — ABNORMAL HIGH (ref 0.1–1.0)
Monocytes Relative: 6 %
Neutro Abs: 14.9 10*3/uL — ABNORMAL HIGH (ref 1.7–7.7)
Neutrophils Relative %: 83 %
Platelets: 355 10*3/uL (ref 150–400)
RBC: 5.6 MIL/uL (ref 4.22–5.81)
RDW: 14.4 % (ref 11.5–15.5)
WBC: 17.9 10*3/uL — ABNORMAL HIGH (ref 4.0–10.5)
nRBC: 0 % (ref 0.0–0.2)

## 2021-07-04 LAB — BASIC METABOLIC PANEL
Anion gap: 9 (ref 5–15)
BUN: 35 mg/dL — ABNORMAL HIGH (ref 8–23)
CO2: 25 mmol/L (ref 22–32)
Calcium: 8.4 mg/dL — ABNORMAL LOW (ref 8.9–10.3)
Chloride: 112 mmol/L — ABNORMAL HIGH (ref 98–111)
Creatinine, Ser: 1.11 mg/dL (ref 0.61–1.24)
GFR, Estimated: >60 mL/min (ref >60)
Glucose, Bld: 147 mg/dL — ABNORMAL HIGH (ref 70–99)
Potassium: 3.1 mmol/L — ABNORMAL LOW (ref 3.5–5.1)
Sodium: 146 mmol/L — ABNORMAL HIGH (ref 135–145)

## 2021-07-04 LAB — GLUCOSE, CAPILLARY
Glucose-Capillary: 145 mg/dL — ABNORMAL HIGH (ref 70–99)
Glucose-Capillary: 59 mg/dL — ABNORMAL LOW (ref 70–99)
Glucose-Capillary: 150 mg/dL — ABNORMAL HIGH (ref 70–99)
Glucose-Capillary: 169 mg/dL — ABNORMAL HIGH (ref 70–99)
Glucose-Capillary: 141 mg/dL — ABNORMAL HIGH (ref 70–99)
Glucose-Capillary: 150 mg/dL — ABNORMAL HIGH (ref 70–99)

## 2021-07-04 LAB — PROCALCITONIN: Procalcitonin: <0.1 ng/mL

## 2021-07-04 MED ORDER — POTASSIUM CHLORIDE 20 MEQ PO PACK
40.0000 meq | PACK | Freq: Once | ORAL | Status: AC
  Administered 2021-07-04: 40 meq via ORAL
  Filled 2021-07-04: qty 2

## 2021-07-04 MED ORDER — PANTOPRAZOLE SODIUM 40 MG PO TBEC
40.0000 mg | DELAYED_RELEASE_TABLET | Freq: Every day | ORAL | Status: DC
  Administered 2021-07-05 – 2021-07-08 (×4): 40 mg via ORAL
  Filled 2021-07-04 (×4): qty 1

## 2021-07-04 MED ORDER — PREDNISONE 20 MG PO TABS
40.0000 mg | ORAL_TABLET | Freq: Every day | ORAL | Status: DC
  Administered 2021-07-05 – 2021-07-08 (×4): 40 mg via ORAL
  Filled 2021-07-04 (×4): qty 2

## 2021-07-04 NOTE — Progress Notes (Signed)
PROGRESS NOTE    Alvin Hardy  JIR:[id number] DOB: Oct 31, 1935 DOA: 06/28/2021 PCP: Crist Infante, MD    Brief Narrative:  This 86 years old male with PMH significant for depression, essential hypertension, GERD, hyperlipidemia, hypogonadism, psoriatic arthritis, BPH, lung CA (in remission, s/p lobectomy )presented in the ED with fever and shortness of breath.  He is found to have multifocal pneumonia.  He was seen by PCP and started on doxycycline without any improvement.  He was subsequently placed on Zithromax, prednisone and albuterol despite this intervention. Patient continued to have worsening shortness of breath. Patient was admitted for acute hypoxic respiratory failure secondary to multifocal pneumonia requiring intubation.  Patient was admitted in ICU and subsequently extubated. He was noted to have acute expressive aphasia,  encephalopathy and confusion.  further work-up with brain MRI and EEG ruled out seizures and acute stroke.   Assessment & Plan:   Principal Problem:   Multifocal pneumonia Active Problems:   Dyspnea   Sepsis ([place])   History of lung cancer   Essential hypertension   Prolonged QT interval   Acute respiratory failure with hypoxia (HCC)  Acute hypoxic respiratory failure due to multifocal pneumonia requiring mechanical intubation/Severe sepsis secondary to above.  (Present on admission) Patient presented with worsening shortness of breath, intubated in the ED. Patient admitted in ICU,  continued on antibiotics, subsequently extubated. CTA chest ruled out PE ,  but showed Multi focal pneumonia Patient is now on 2 L/ m [place], sat's 94%. LVEF 45 to 75% grade 2 diastolic dysfunction on echo. Blood cultures no growth.  Change antibiotics to ceftriaxone and Zithromax x 7 days. Respiratory panel negative.  Strep pneumo and Legionella negative. Patient found to have wheezing in setting of her tobacco abuse, possible COPD exacerbation Continue bronchodilators and  solumedrol. Sepsis physiology resolved. Encourage out of bed to the chair.  Acute encephalopathy could be secondary to hypoxia.: Continued to have intermittent confusion could be due to hypoxia > now resolved. Patient has developed facial asymmetry, MRI ruled out acute stroke EEG negative for seizures.  Neuro exam nonfocal. Patient is back to her baseline mental status.  History of lung cancer s/p right upper lobe lobectomy and radiation: Continue outpatient oncology follow-up.  Hypertension: Continue losartan, propranolol. Continue hydralazine as needed.  Hyperlipidemia: Continue statins.  Bipolar disorder: Continue lithium.  Anxiety disorder: Continue Klonopin.   DVT prophylaxis: Lovenox Code Status: Full code Family Communication: No family at bedside Disposition Plan:   Status is: Inpatient  Remains inpatient appropriate because:Inpatient level of care appropriate due to severity of illness  Dispo: The patient is from: Home              Anticipated d/c is to: Home              Patient currently is not medically stable to d/c.   Difficult to place patient No   Consultants:  Neurology PCCM  Procedures: MRI Antimicrobials:   Anti-infectives (From admission, onward)    Start     Dose/Rate Route Frequency Ordered Stop   07/03/21 1600  cefTRIAXone (ROCEPHIN) 1 g in sodium chloride 0.9 % 100 mL IVPB        1 g 200 mL/hr over 30 Minutes Intravenous Every 24 hours 07/03/21 1422 07/04/21 2359   07/03/21 1515  azithromycin (ZITHROMAX) tablet 500 mg  Status:  Discontinued        500 mg Oral Daily 07/03/21 1421 07/04/21 1407   07/01/21 1800  ceFEPIme (MAXIPIME) 2 g  in sodium chloride 0.9 % 100 mL IVPB  Status:  Discontinued        2 g 200 mL/hr over 30 Minutes Intravenous Every 12 hours 07/01/21 0750 07/03/21 1417   06/28/21 1800  vancomycin (VANCOREADY) IVPB 1750 mg/350 mL  Status:  Discontinued        1,750 mg 175 mL/hr over 120 Minutes Intravenous Every 24 hours  06/28/21 0727 06/28/21 1356   06/28/21 0700  ceFEPIme (MAXIPIME) 1 g in sodium chloride 0.9 % 100 mL IVPB  Status:  Discontinued        1 g 200 mL/hr over 30 Minutes Intravenous Every 8 hours 06/28/21 0619 06/28/21 0644   06/28/21 0700  ceFEPIme (MAXIPIME) 2 g in sodium chloride 0.9 % 100 mL IVPB  Status:  Discontinued        2 g 200 mL/hr over 30 Minutes Intravenous Every 8 hours 06/28/21 0645 07/01/21 0750   06/28/21 0200  ceFEPIme (MAXIPIME) 2 g in sodium chloride 0.9 % 100 mL IVPB        2 g 200 mL/hr over 30 Minutes Intravenous  Once 06/28/21 0151 06/28/21 0250   06/28/21 0200  vancomycin (VANCOCIN) IVPB 1000 mg/200 mL premix        1,000 mg 200 mL/hr over 60 Minutes Intravenous  Once 06/28/21 0151 06/28/21 0345        Subjective: Patient was seen and examined at bedside.  No overnight events. Patient seems comfortable sitting,  having breakfast, denies any symptoms. Facial asymmetry has resolved.   stroke was ruled out.   Objective: Vitals:   07/04/21 0100 07/04/21 0451 07/04/21 0902 07/04/21 1238  BP: (!) 157/86 (!) 160/91  (!) 161/95  Pulse: 97 98  89  Resp: (!) 22 (!) 21  (!) 28  Temp: 98.6 F (37 C) 97.8 F (36.6 C)  99.6 F (37.6 C)  TempSrc: Axillary Oral  Axillary  SpO2: 98% 98% 96% 98%  Weight:      Height:        Intake/Output Summary (Last 24 hours) at 07/04/2021 1432 Last data filed at 07/04/2021 1300 Gross per 24 hour  Intake 300 ml  Output 1000 ml  Net -700 ml   Filed Weights   06/30/21 0800  Weight: 86.9 kg    Examination:  General exam: Appears comfortable, not in any acute distress. Respiratory system: Clear to auscultation bilaterally, no wheezing. Cardiovascular system: S1-S2 heard, regular rate and rhythm, no murmur.   Gastrointestinal system: Abdomen is nondistended, soft and nontender.  Normal bowel sounds heard. Central nervous system: Alert and oriented x3, no focal neurological deficits. Extremities: No edema, no cyanosis, no  clubbing. Skin: No rashes, lesions or ulcers Psychiatry: Mood and affect appropriate.    Data Reviewed: I have personally reviewed following labs and imaging studies  CBC: Recent Labs  Lab 06/28/21 0100 06/29/21 0304 06/30/21 0249 07/01/21 0245 07/02/21 0306 07/03/21 0254 07/04/21 0358  WBC 15.1*   < > 13.4* 13.0* 13.6* 17.4* 17.9*  NEUTROABS 12.3*  --   --   --   --   --  14.9*  HGB 14.5   < > 14.2 14.4 13.4 14.6 15.7  HCT 45.4   < > 47.5 47.2 42.6 46.4 49.2  MCV 89.5   < > 94.2 90.6 87.8 87.9 87.9  PLT 207   < > 221 235 250 315 355   < > = values in this interval not displayed.   Basic Metabolic Panel: Recent Labs  Lab  06/30/21 0249 07/01/21 0245 07/02/21 0306 07/03/21 0254 07/04/21 0358  NA 140 141 144 144 146*  K 4.9 4.2 3.8 3.7 3.1*  CL 108 109 111 110 112*  CO2 26 22 23 24 25   GLUCOSE 97 121* 89 141* 147*  BUN 28* 38* 33* 38* 35*  CREATININE 1.04 1.15 1.11 1.18 1.11  CALCIUM 8.6* 8.5* 8.3* 8.2* 8.4*  MG 2.5* 2.4  --   --   --   PHOS 3.3 3.4  --   --   --    GFR: Estimated Creatinine Clearance: 51.2 mL/min (by C-G formula based on SCr of 1.11 mg/dL). Liver Function Tests: Recent Labs  Lab 06/28/21 0100 06/29/21 0304  AST 28 22  ALT 20 18  ALKPHOS 47 38  BILITOT 1.1 0.7  PROT 6.8 5.9*  ALBUMIN 3.9 3.2*   No results for input(s): LIPASE, AMYLASE in the last 168 hours. No results for input(s): AMMONIA in the last 168 hours. Coagulation Profile: Recent Labs  Lab 06/28/21 0100  INR 1.1   Cardiac Enzymes: No results for input(s): CKTOTAL, CKMB, CKMBINDEX, TROPONINI in the last 168 hours. BNP (last 3 results) No results for input(s): PROBNP in the last 8760 hours. HbA1C: Recent Labs    07/02/21 1758  HGBA1C 5.7*   CBG: Recent Labs  Lab 07/03/21 2117 07/04/21 0125 07/04/21 0707 07/04/21 0748 07/04/21 1111  GLUCAP 175* 145* 59* 150* 169*   Lipid Profile: Recent Labs    07/02/21 1758  LDLDIRECT 32.8   Thyroid Function Tests: No  results for input(s): TSH, T4TOTAL, FREET4, T3FREE, THYROIDAB in the last 72 hours. Anemia Panel: No results for input(s): VITAMINB12, FOLATE, FERRITIN, TIBC, IRON, RETICCTPCT in the last 72 hours. Sepsis Labs: Recent Labs  Lab 06/28/21 0420 06/28/21 0630 06/28/21 0844 06/29/21 0936  LATICACIDVEN 4.7* 4.8* 2.6* 1.3    Recent Results (from the past 240 hour(s))  Resp Panel by RT-PCR (Flu A&B, Covid) Nasopharyngeal Swab     Status: None   Collection Time: 06/28/21  1:00 AM   Specimen: Nasopharyngeal Swab; Nasopharyngeal(NP) swabs in vial transport medium  Result Value Ref Range Status   SARS Coronavirus 2 by RT PCR NEGATIVE NEGATIVE Final    Comment: (NOTE) SARS-CoV-2 target nucleic acids are NOT DETECTED.  The SARS-CoV-2 RNA is generally detectable in upper respiratory specimens during the acute phase of infection. The lowest concentration of SARS-CoV-2 viral copies this assay can detect is 138 copies/mL. A negative result does not preclude SARS-Cov-2 infection and should not be used as the sole basis for treatment or other patient management decisions. A negative result may occur with  improper specimen collection/handling, submission of specimen other than nasopharyngeal swab, presence of viral mutation(s) within the areas targeted by this assay, and inadequate number of viral copies(<138 copies/mL). A negative result must be combined with clinical observations, patient history, and epidemiological information. The expected result is Negative.  Fact Sheet for Patients:  EntrepreneurPulse.com.au  Fact Sheet for Healthcare Providers:  IncredibleEmployment.be  This test is no t yet approved or cleared by the [place] FDA and  has been authorized for detection and/or diagnosis of SARS-CoV-2 by FDA under an Emergency Use Authorization (EUA). This EUA will remain  in effect (meaning this test can be used) for the duration of the COVID-19  declaration under Section 564(b)(1) of the Act, 21 U.S.C.section 360bbb-3(b)(1), unless the authorization is terminated  or revoked sooner.       Influenza A by PCR NEGATIVE NEGATIVE Final  Influenza B by PCR NEGATIVE NEGATIVE Final    Comment: (NOTE) The Xpert Xpress SARS-CoV-2/FLU/RSV plus assay is intended as an aid in the diagnosis of influenza from Nasopharyngeal swab specimens and should not be used as a sole basis for treatment. Nasal washings and aspirates are unacceptable for Xpert Xpress SARS-CoV-2/FLU/RSV testing.  Fact Sheet for Patients: EntrepreneurPulse.com.au  Fact Sheet for Healthcare Providers: IncredibleEmployment.be  This test is not yet approved or cleared by the [place] FDA and has been authorized for detection and/or diagnosis of SARS-CoV-2 by FDA under an Emergency Use Authorization (EUA). This EUA will remain in effect (meaning this test can be used) for the duration of the COVID-19 declaration under Section 564(b)(1) of the Act, 21 U.S.C. section 360bbb-3(b)(1), unless the authorization is terminated or revoked.  Performed at [facility], [place] [address] [place], [place] [address]   Blood Culture (routine x 2)     Status: None   Collection Time: 06/28/21  1:00 AM   Specimen: BLOOD  Result Value Ref Range Status   Specimen Description   Final    BLOOD BLOOD LEFT ARM Performed at [place] [address], [place], [place] [address]    Special Requests   Final    BOTTLES DRAWN AEROBIC AND ANAEROBIC Blood Culture adequate volume Performed at [place] [address], [place], [place] [address]    Culture   Final    NO GROWTH 5 DAYS Performed at [facility], [place] [address] [place], [place] [address]    Report Status 07/03/2021 FINAL  Final  Blood Culture (routine x 2)     Status: None   Collection Time: 06/28/21  1:00 AM    Specimen: BLOOD  Result Value Ref Range Status   Specimen Description   Final    BLOOD BLOOD RIGHT ARM Performed at [place] [address], [place], [place] [address]    Special Requests   Final    BOTTLES DRAWN AEROBIC AND ANAEROBIC Blood Culture adequate volume Performed at [place] [address], [place], [place] [address]    Culture   Final    NO GROWTH 5 DAYS Performed at [facility], [place] [address] [place], [place] [address]    Report Status 07/03/2021 FINAL  Final  Urine Culture     Status: Abnormal   Collection Time: 06/28/21  4:50 AM   Specimen: In/Out Cath Urine  Result Value Ref Range Status   Specimen Description   Final    IN/OUT CATH URINE Performed at [place] [address], [place], [place] [address]    Special Requests   Final    NONE Performed at [facility], [place] [address], [place], [place] [address]    Culture (A)  Final    <10,000 COLONIES/mL INSIGNIFICANT GROWTH Performed at [place] [address] [place], [place] [address]    Report Status 06/29/2021 FINAL  Final  MRSA Next Gen by PCR, Nasal     Status: None   Collection Time: 06/28/21  8:23 AM   Specimen: Nasal Mucosa; Nasal Swab  Result Value Ref Range Status   MRSA by PCR Next Gen NOT DETECTED NOT DETECTED Final    Comment: (NOTE) The GeneXpert MRSA Assay (FDA approved for NASAL specimens only), is one component of a comprehensive MRSA colonization surveillance program. It is not intended to diagnose MRSA infection nor to  guide or monitor treatment for MRSA infections. Test performance is not FDA approved in patients less than 70 years old. Performed at [facility], [place] [address] [place], [place] [address]   Respiratory (~20 pathogens) panel by PCR     Status: None   Collection Time: 06/29/21  1:24 PM   Specimen: Nasopharyngeal Swab; Respiratory  Result  Value Ref Range Status   Adenovirus NOT DETECTED NOT DETECTED Final   Coronavirus 229E NOT DETECTED NOT DETECTED Final    Comment: (NOTE) The Coronavirus on the Respiratory Panel, DOES NOT test for the novel  Coronavirus (2019 nCoV)    Coronavirus HKU1 NOT DETECTED NOT DETECTED Final   Coronavirus NL63 NOT DETECTED NOT DETECTED Final   Coronavirus OC43 NOT DETECTED NOT DETECTED Final   Metapneumovirus NOT DETECTED NOT DETECTED Final   Rhinovirus / Enterovirus NOT DETECTED NOT DETECTED Final   Influenza A NOT DETECTED NOT DETECTED Final   Influenza B NOT DETECTED NOT DETECTED Final   Parainfluenza Virus 1 NOT DETECTED NOT DETECTED Final   Parainfluenza Virus 2 NOT DETECTED NOT DETECTED Final   Parainfluenza Virus 3 NOT DETECTED NOT DETECTED Final   Parainfluenza Virus 4 NOT DETECTED NOT DETECTED Final   Respiratory Syncytial Virus NOT DETECTED NOT DETECTED Final   Bordetella pertussis NOT DETECTED NOT DETECTED Final   Bordetella Parapertussis NOT DETECTED NOT DETECTED Final   Chlamydophila pneumoniae NOT DETECTED NOT DETECTED Final   Mycoplasma pneumoniae NOT DETECTED NOT DETECTED Final    Comment: Performed at [facility], [place]. [address] [place], [place] [address]  Culture, Respiratory w Gram Stain     Status: None   Collection Time: 06/30/21  4:15 PM   Specimen: Tracheal Aspirate; Respiratory  Result Value Ref Range Status   Specimen Description   Final    TRACHEAL ASPIRATE Performed at [place] [address] [place], [place] [address]    Special Requests   Final    NONE Performed at [facility], [place] [address], [place], [place] [address]    Gram Stain   Final    FEW SQUAMOUS EPITHELIAL CELLS PRESENT FEW WBC SEEN FEW GRAM POSITIVE COCCI FEW GRAM NEGATIVE RODS FEW GRAM POSITIVE RODS    Culture   Final    FEW Normal respiratory flora-no Staph aureus or Pseudomonas seen Performed at [facility], [address] [place] [address], [place], [place] [address]    Report Status 07/03/2021 FINAL  Final    Radiology Studies: CT ANGIO HEAD W OR WO CONTRAST  Result Date: 07/02/2021 CLINICAL DATA:  Aphasia and encephalopathy EXAM: CT ANGIOGRAPHY HEAD AND NECK TECHNIQUE: Multidetector CT imaging of the head and neck was performed using the standard protocol during bolus administration of intravenous contrast. Multiplanar CT image reconstructions and MIPs were obtained to evaluate the vascular anatomy. Carotid stenosis measurements (when applicable) are obtained utilizing NASCET criteria, using the distal internal carotid diameter as the denominator. CONTRAST:  49mL OMNIPAQUE IOHEXOL 350 MG/ML SOLN COMPARISON:  None. FINDINGS: CT HEAD FINDINGS Brain: There is no mass, hemorrhage or extra-axial collection. The size and configuration of the ventricles and extra-axial CSF spaces are normal. There is hypoattenuation of the periventricular white matter, most commonly indicating chronic ischemic microangiopathy. Skull: The visualized skull base, calvarium and extracranial soft tissues are normal. Sinuses/Orbits: No fluid levels or advanced mucosal thickening of the visualized paranasal sinuses. No mastoid or middle ear effusion. The orbits are normal. CTA NECK FINDINGS SKELETON: There  is no bony spinal canal stenosis. No lytic or blastic lesion. OTHER NECK: Normal pharynx, larynx and major salivary glands. No cervical lymphadenopathy. Unremarkable thyroid gland. UPPER CHEST: No pneumothorax or pleural effusion. No nodules or masses. AORTIC ARCH: There is no calcific atherosclerosis of the aortic arch. There is no aneurysm, dissection or hemodynamically significant stenosis of the visualized portion of the aorta. Conventional 3 vessel aortic branching pattern. The visualized proximal subclavian arteries are widely patent. RIGHT CAROTID SYSTEM: No dissection, occlusion or aneurysm. Mild atherosclerotic calcification at the carotid bifurcation without  hemodynamically significant stenosis. LEFT CAROTID SYSTEM: No dissection, occlusion or aneurysm. Mild atherosclerotic calcification at the carotid bifurcation without hemodynamically significant stenosis. VERTEBRAL ARTERIES: Left dominant configuration. Both origins are clearly patent. There is no dissection, occlusion or flow-limiting stenosis to the skull base (V1-V3 segments). CTA HEAD FINDINGS POSTERIOR CIRCULATION: --Vertebral arteries: Normal V4 segments. --Inferior cerebellar arteries: Normal. --Basilar artery: Normal. --Superior cerebellar arteries: Normal. --Posterior cerebral arteries (PCA): Normal. ANTERIOR CIRCULATION: --Intracranial internal carotid arteries: Normal. --Anterior cerebral arteries (ACA): Normal. Both A1 segments are present. Patent anterior communicating artery (a-comm). --Middle cerebral arteries (MCA): Normal. VENOUS SINUSES: As permitted by contrast timing, patent. ANATOMIC VARIANTS: None Review of the MIP images confirms the above findings. IMPRESSION: 1. No emergent large vessel occlusion or hemodynamically significant stenosis of the head or neck. 2. Chronic ischemic microangiopathy. Electronically Signed   By: Ulyses Jarred M.D.   On: 07/02/2021 20:31   CT ANGIO NECK W OR WO CONTRAST  Result Date: 07/02/2021 CLINICAL DATA:  Aphasia and encephalopathy EXAM: CT ANGIOGRAPHY HEAD AND NECK TECHNIQUE: Multidetector CT imaging of the head and neck was performed using the standard protocol during bolus administration of intravenous contrast. Multiplanar CT image reconstructions and MIPs were obtained to evaluate the vascular anatomy. Carotid stenosis measurements (when applicable) are obtained utilizing NASCET criteria, using the distal internal carotid diameter as the denominator. CONTRAST:  23mL OMNIPAQUE IOHEXOL 350 MG/ML SOLN COMPARISON:  None. FINDINGS: CT HEAD FINDINGS Brain: There is no mass, hemorrhage or extra-axial collection. The size and configuration of the ventricles and  extra-axial CSF spaces are normal. There is hypoattenuation of the periventricular white matter, most commonly indicating chronic ischemic microangiopathy. Skull: The visualized skull base, calvarium and extracranial soft tissues are normal. Sinuses/Orbits: No fluid levels or advanced mucosal thickening of the visualized paranasal sinuses. No mastoid or middle ear effusion. The orbits are normal. CTA NECK FINDINGS SKELETON: There is no bony spinal canal stenosis. No lytic or blastic lesion. OTHER NECK: Normal pharynx, larynx and major salivary glands. No cervical lymphadenopathy. Unremarkable thyroid gland. UPPER CHEST: No pneumothorax or pleural effusion. No nodules or masses. AORTIC ARCH: There is no calcific atherosclerosis of the aortic arch. There is no aneurysm, dissection or hemodynamically significant stenosis of the visualized portion of the aorta. Conventional 3 vessel aortic branching pattern. The visualized proximal subclavian arteries are widely patent. RIGHT CAROTID SYSTEM: No dissection, occlusion or aneurysm. Mild atherosclerotic calcification at the carotid bifurcation without hemodynamically significant stenosis. LEFT CAROTID SYSTEM: No dissection, occlusion or aneurysm. Mild atherosclerotic calcification at the carotid bifurcation without hemodynamically significant stenosis. VERTEBRAL ARTERIES: Left dominant configuration. Both origins are clearly patent. There is no dissection, occlusion or flow-limiting stenosis to the skull base (V1-V3 segments). CTA HEAD FINDINGS POSTERIOR CIRCULATION: --Vertebral arteries: Normal V4 segments. --Inferior cerebellar arteries: Normal. --Basilar artery: Normal. --Superior cerebellar arteries: Normal. --Posterior cerebral arteries (PCA): Normal. ANTERIOR CIRCULATION: --Intracranial internal carotid arteries: Normal. --Anterior cerebral arteries (ACA): Normal. Both A1 segments are  present. Patent anterior communicating artery (a-comm). --Middle cerebral arteries  (MCA): Normal. VENOUS SINUSES: As permitted by contrast timing, patent. ANATOMIC VARIANTS: None Review of the MIP images confirms the above findings. IMPRESSION: 1. No emergent large vessel occlusion or hemodynamically significant stenosis of the head or neck. 2. Chronic ischemic microangiopathy. Electronically Signed   By: Ulyses Jarred M.D.   On: 07/02/2021 20:31   EEG adult  Result Date: 07/03/2021 Lora Havens, MD     07/03/2021 11:58 AM Patient Name: Alvin Hardy MRN: [id number] Epilepsy Attending: Lora Havens Referring Physician/Provider: Dr Margaretha Seeds Date: 07/03/2021 Duration: 25.32 mins Patient history: 86 year old male with speech disturbance as well as altered mental status.  EEG to evaluate for seizures. Level of alertness: Awake, asleep AEDs during EEG study: GBP, Clonazepam Technical aspects: This EEG study was done with scalp electrodes positioned according to the 10-20 International system of electrode placement. Electrical activity was acquired at a sampling rate of 500Hz  and reviewed with a high frequency filter of 70Hz  and a low frequency filter of 1Hz . EEG data were recorded continuously and digitally stored. Description: No clear posterior dominant rhythm was seen. Sleep was characterized by sleep spindles (12 to 14 Hz), maximal frontocentral region.  EEG showed continuous generalized 5 to 6 Hz theta-delta slowing as well as intermittent generalized 2 to 3 Hz delta slowing.  Hyperventilation and photic stimulation were not performed.   ABNORMALITY - Continuous slow, generalized IMPRESSION: This study is suggestive of moderate diffuse encephalopathy, nonspecific etiology. No seizures or epileptiform discharges were seen throughout the recording. Priyanka Barbra Sarks    Scheduled Meds:  aspirin  81 mg Oral Daily   Chlorhexidine Gluconate Cloth  6 each Topical Daily   clonazePAM  0.5 mg Oral QHS   clopidogrel  75 mg Oral Daily   enoxaparin (LOVENOX) injection  40 mg Subcutaneous  Daily   gabapentin  600 mg Oral Daily   And   gabapentin  900 mg Oral QHS   ipratropium-albuterol  3 mL Nebulization Q6H   lithium carbonate  300 mg Oral Q12H   losartan  50 mg Oral Daily   mouth rinse  15 mL Mouth Rinse BID   methylPREDNISolone (SOLU-MEDROL) injection  40 mg Intravenous Daily   mometasone-formoterol  2 puff Inhalation BID   [START ON 07/05/2021] pantoprazole  40 mg Oral Daily   propranolol  10 mg Oral Daily   Continuous Infusions:  sodium chloride Stopped (07/01/21 1801)   cefTRIAXone (ROCEPHIN)  IV Stopped (07/03/21 1700)     LOS: 6 days    Time spent: 25 mins    Finnegan Gatta, MD [facility]   If 7PM-7AM, please contact night-coverage

## 2021-07-04 NOTE — Progress Notes (Signed)
Hypoglycemic Event  CBG:  59 at 0707  Treatment: 8 oz juice/soda  Symptoms: None  Follow-up CBG: [id number] CBG Result:150  Possible Reasons for Event: Inadequate meal intake  Comments/MD notified: Will continue to monitor    Alvin Hardy

## 2021-07-04 NOTE — Progress Notes (Signed)
Pt BS 59 at shift change. Pt asymptomatic. Given orange juice. Will recheck momentarily.

## 2021-07-04 NOTE — Evaluation (Addendum)
Occupational Therapy Evaluation Patient Details Name: Alvin Hardy MRN: [id number] DOB: 1935/02/07 Today's Date: 07/04/2021    History of Present Illness Patient is a 86 years old male presented in the ED on 06/28/21 with fever and shortness of breath andadmitted for acute hypoxic respiratory failure due to multifocal pneumonia requiring mechanical intubation/Severe sepsis.  Pt was also noted to have acute expressive aphasia, encephalopathy and confusion, however had further work-up with brain MRI and EEG ruled out seizures and acute stroke.  PMH significant for depression, essential hypertension, GERD, hyperlipidemia, hypogonadism, psoriatic arthritis, BPH, lung CA (in remission, s/p lobectomy), VATS   Clinical Impression   Patient is an 86 year old male who was admitted for above. Patient was living at [facility] while caring for wife prior level. Currently, patient is max A for ADLs and TD for transfers. Evaluation was limited by fatigue and nausea on this date. Patient was noted to have decreased activity tolerance, decreased endurance, and decreased sitting balance impacting ability to participate in ADLs. Patient would continue to benefit from skilled OT services at this time while admitted and after d/c to address noted deficits in order to improve overall safety and independence in ADLs.      Follow Up Recommendations  SNF    Equipment Recommendations  3 in 1 bedside commode    Recommendations for Other Services       Precautions / Restrictions Precautions Precautions: Fall Precaution Comments: currently on 2L O2 [place] Restrictions Weight Bearing Restrictions: No      Mobility Bed Mobility Overal bed mobility: Needs Assistance Bed Mobility: Rolling Rolling: Max assist;+2 for safety/equipment         General bed mobility comments: pt reporting BM, RN assisted with pericare; pt currently max assist to roll with cues for self assisting however very weak; pt fatigued thereafter and  felt unable to further mobilize    Transfers                 General transfer comment: will likely require equipment or significant +2 to start    Balance                                           ADL either performed or assessed with clinical judgement   ADL Overall ADL's : Needs assistance/impaired Eating/Feeding: Set up;Sitting;Supervision/ safety Eating/Feeding Details (indicate cue type and reason): patient currently has swallow precautions Grooming: Wash/dry face;Oral care;Bed level;Minimal assistance Grooming Details (indicate cue type and reason): patient was noted to fatigue with washing face and attempting oral care on this date. patient declined to particiapte further citing nausea. nurse made aware Upper Body Bathing: Moderate assistance;Bed level   Lower Body Bathing: Bed level;Total assistance   Upper Body Dressing : Moderate assistance;Bed level   Lower Body Dressing: Total assistance;Bed level   Toilet Transfer: +2 for physical assistance;+2 for safety/equipment;Total assistance   Toileting- Clothing Manipulation and Hygiene: Total assistance;Bed level         General ADL Comments: patient was unable to tolerated much physical activity at bed level on this date. O2 saturation aws 2L/min via nasal cannula with O2 maintaining above 94% during tasks.     Vision Patient Visual Report: No change from baseline       Perception     Praxis      Pertinent Vitals/Pain Pain Assessment: No/denies pain     Hand  Dominance Right   Extremity/Trunk Assessment Upper Extremity Assessment Upper Extremity Assessment: Generalized weakness   Lower Extremity Assessment Lower Extremity Assessment: Defer to PT evaluation   Cervical / Trunk Assessment Cervical / Trunk Assessment: Normal   Communication Communication Communication: No difficulties   Cognition Arousal/Alertness: Awake/alert Behavior During Therapy: WFL for tasks  assessed/performed Overall Cognitive Status: Within Functional Limits for tasks assessed                                     General Comments       Exercises     Shoulder Instructions      Home Living Family/patient expects to be discharged to:: Assisted living Living Arrangements: Spouse/significant other Available Help at Discharge: Family;Available PRN/intermittently Type of Home: Assisted living       Home Layout: One level               Home Equipment: None   Additional Comments: spouse recently diagnosed with myasthenia gravis, daughter has been helping      Prior Functioning/Environment Level of Independence: Independent                 OT Problem List: Decreased strength;Decreased activity tolerance;Impaired balance (sitting and/or standing);Decreased safety awareness;Decreased knowledge of use of DME or AE      OT Treatment/Interventions: Self-care/ADL training;Therapeutic exercise;Energy conservation;DME and/or AE instruction;Therapeutic activities;Balance training;Patient/family education    OT Goals(Current goals can be found in the care plan section) Acute Rehab OT Goals Patient Stated Goal: to get back to ILF OT Goal Formulation: With patient Time For Goal Achievement: 07/18/21 Potential to Achieve Goals: Fair  OT Frequency: Min 2X/week   Barriers to D/C:    patient was caring for wife at home       Co-evaluation              AM-PAC OT "6 Clicks" Daily Activity     Outcome Measure Help from another person eating meals?: A Little Help from another person taking care of personal grooming?: A Little Help from another person toileting, which includes using toliet, bedpan, or urinal?: Total Help from another person bathing (including washing, rinsing, drying)?: Total Help from another person to put on and taking off regular upper body clothing?: A Lot Help from another person to put on and taking off regular lower body  clothing?: Total 6 Click Score: 11   End of Session Nurse Communication: Mobility status  Activity Tolerance: Patient limited by fatigue Patient left: in bed;with call bell/phone within reach  OT Visit Diagnosis: Unsteadiness on feet (R26.81);Muscle weakness (generalized) (M62.81)                Time: 0340-3524 OT Time Calculation (min): 14 min Charges:  OT General Charges $OT Visit: 1 Visit OT Evaluation $OT Eval Moderate Complexity: 1 Mod  Jackelyn Poling OTR/L, MS Acute Rehabilitation Department Office# [phone] Pager# [phone]   [place] 07/04/2021, 4:15 PM

## 2021-07-04 NOTE — Evaluation (Signed)
Physical Therapy Evaluation Patient Details Name: Alvin Hardy MRN: [id number] DOB: 1935-04-13 Today's Date: 07/04/2021   History of Present Illness  86 years old male presented in the ED on 06/28/21 with fever and shortness of breath andadmitted for acute hypoxic respiratory failure due to multifocal pneumonia requiring mechanical intubation/Severe sepsis.  Pt was also noted to have acute expressive aphasia, encephalopathy and confusion, however had further work-up with brain MRI and EEG ruled out seizures and acute stroke.  PMH significant for depression, essential hypertension, GERD, hyperlipidemia, hypogonadism, psoriatic arthritis, BPH, lung CA (in remission, s/p lobectomy), VATS  Clinical Impression  Pt admitted with above diagnosis. Pt currently with functional limitations due to the deficits listed below (see PT Problem List). Pt will benefit from skilled PT to increase their independence and safety with mobility to allow discharge to the venue listed below.  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  Pt requiring +2 assist for rolling and would recommend OOB with lift for nursing at this time.  Pt typically very independent so discussed pt will require assist at this time and will likely slowly progress with mobility.  Pt does not have physical assist available at home and would benefit from d/c to SNF for rehab.     Follow Up Recommendations SNF    Equipment Recommendations  Other (comment) (would likely need lift, w/c and hospital bed if return to ILF)    Recommendations for Other Services       Precautions / Restrictions Precautions Precautions: Fall Precaution Comments: currently on 2L O2 [place]      Mobility  Bed Mobility Overal bed mobility: Needs Assistance Bed Mobility: Rolling Rolling: Max assist;+2 for safety/equipment         General bed mobility comments: pt reporting BM, RN assisted with pericare; pt currently max  assist to roll with cues for self assisting however very weak; pt fatigued thereafter and felt unable to further mobilize    Transfers                 General transfer comment: will likely require equipment or significant +2 to start  Ambulation/Gait                Stairs            Wheelchair Mobility    Modified Rankin (Stroke Patients Only)       Balance                                             Pertinent Vitals/Pain Pain Assessment: No/denies pain    Home Living   Living Arrangements: Spouse/significant other Available Help at Discharge: Family;Available PRN/intermittently Type of Home: Independent living facility [facility])       Home Layout: One level Home Equipment: None Additional Comments: spouse recently diagnosed with myasthenia gravis, daughter has been helping    Prior Function Level of Independence: Independent               Hand Dominance        Extremity/Trunk Assessment        Lower Extremity Assessment Lower Extremity Assessment: Generalized weakness (grossly 2+/5 throughout)       Communication   Communication: No difficulties  Cognition Arousal/Alertness: Awake/alert Behavior During Therapy: WFL for tasks assessed/performed Overall Cognitive Status: Within Functional Limits for tasks assessed  General Comments      Exercises     Assessment/Plan    PT Assessment Patient needs continued PT services  PT Problem List Decreased balance;Decreased knowledge of use of DME;Decreased activity tolerance;Decreased strength;Decreased mobility       PT Treatment Interventions DME instruction;Balance training;Gait training;Therapeutic exercise;Functional mobility training;Patient/family education;Therapeutic activities    PT Goals (Current goals can be found in the Care Plan section)  Acute Rehab PT Goals PT Goal Formulation: With  patient Time For Goal Achievement: 07/18/21 Potential to Achieve Goals: Good    Frequency Min 2X/week   Barriers to discharge        Co-evaluation               AM-PAC PT "6 Clicks" Mobility  Outcome Measure Help needed turning from your back to your side while in a flat bed without using bedrails?: Total Help needed moving from lying on your back to sitting on the side of a flat bed without using bedrails?: Total Help needed moving to and from a bed to a chair (including a wheelchair)?: Total Help needed standing up from a chair using your arms (e.g., wheelchair or bedside chair)?: Total Help needed to walk in hospital room?: Total Help needed climbing 3-5 steps with a railing? : Total 6 Click Score: 6    End of Session Equipment Utilized During Treatment: Gait belt Activity Tolerance: Patient limited by fatigue Patient left: in bed;with call bell/phone within reach;with bed alarm set Nurse Communication: Mobility status;Need for lift equipment PT Visit Diagnosis: Muscle weakness (generalized) (M62.81);Difficulty in walking, not elsewhere classified (R26.2)    Time: 1610-9604 PT Time Calculation (min) (ACUTE ONLY): 17 min   Charges:   PT Evaluation $PT Eval Low Complexity: 1 Low        Kati PT, DPT Acute Rehabilitation Services Pager: [phone] Office: [phone]   [place] Ram E 07/04/2021, 3:37 PM

## 2021-07-05 ENCOUNTER — Inpatient Hospital Stay (HOSPITAL_COMMUNITY): Payer: Medicare Other

## 2021-07-05 LAB — GLUCOSE, CAPILLARY
Glucose-Capillary: 120 mg/dL — ABNORMAL HIGH (ref 70–99)
Glucose-Capillary: 131 mg/dL — ABNORMAL HIGH (ref 70–99)
Glucose-Capillary: 155 mg/dL — ABNORMAL HIGH (ref 70–99)
Glucose-Capillary: 146 mg/dL — ABNORMAL HIGH (ref 70–99)
Glucose-Capillary: 142 mg/dL — ABNORMAL HIGH (ref 70–99)

## 2021-07-05 LAB — CBC
HCT: 53.8 % — ABNORMAL HIGH (ref 39.0–52.0)
Hemoglobin: 16.9 g/dL (ref 13.0–17.0)
MCH: 27.8 pg (ref 26.0–34.0)
MCHC: 31.4 g/dL (ref 30.0–36.0)
MCV: 88.5 fL (ref 80.0–100.0)
Platelets: 302 10*3/uL (ref 150–400)
RBC: 6.08 MIL/uL — ABNORMAL HIGH (ref 4.22–5.81)
RDW: 14.4 % (ref 11.5–15.5)
WBC: 18.7 10*3/uL — ABNORMAL HIGH (ref 4.0–10.5)
nRBC: 0 % (ref 0.0–0.2)

## 2021-07-05 LAB — BASIC METABOLIC PANEL
Anion gap: 5 (ref 5–15)
BUN: 28 mg/dL — ABNORMAL HIGH (ref 8–23)
CO2: 28 mmol/L (ref 22–32)
Calcium: 8.7 mg/dL — ABNORMAL LOW (ref 8.9–10.3)
Chloride: 113 mmol/L — ABNORMAL HIGH (ref 98–111)
Creatinine, Ser: 1.14 mg/dL (ref 0.61–1.24)
GFR, Estimated: >60 mL/min (ref >60)
Glucose, Bld: 157 mg/dL — ABNORMAL HIGH (ref 70–99)
Potassium: 3.2 mmol/L — ABNORMAL LOW (ref 3.5–5.1)
Sodium: 146 mmol/L — ABNORMAL HIGH (ref 135–145)

## 2021-07-05 MED ORDER — POLYVINYL ALCOHOL 1.4 % OP SOLN
1.0000 [drp] | OPHTHALMIC | Status: DC | PRN
  Administered 2021-07-05: 1 [drp] via OPHTHALMIC
  Filled 2021-07-05: qty 15

## 2021-07-05 MED ORDER — TAMSULOSIN HCL 0.4 MG PO CAPS
0.8000 mg | ORAL_CAPSULE | Freq: Every day | ORAL | Status: AC
  Administered 2021-07-05 – 2021-07-07 (×3): 0.8 mg via ORAL
  Filled 2021-07-05 (×3): qty 2

## 2021-07-05 NOTE — Progress Notes (Signed)
PROGRESS NOTE    Alvin Hardy  NIO:[id number] DOB: 11-16-1934 DOA: 06/28/2021 PCP: Crist Infante, MD    Brief Narrative:  This 86 years old male with PMH significant for depression, essential hypertension, GERD, hyperlipidemia, hypogonadism, psoriatic arthritis, BPH, lung CA (in remission, s/p lobectomy )presented in the ED with fever and shortness of breath.  He is found to have multifocal pneumonia.  He was seen by PCP and started on doxycycline without any improvement.  He was subsequently placed on Zithromax, prednisone and albuterol despite this intervention. Patient continued to have worsening shortness of breath. Patient was admitted for acute hypoxic respiratory failure secondary to multifocal pneumonia requiring intubation.  Patient was admitted in ICU and subsequently extubated. He was noted to have acute expressive aphasia,  encephalopathy and confusion.  further work-up with brain MRI and EEG ruled out seizures and acute stroke.   Assessment & Plan:   Principal Problem:   Multifocal pneumonia Active Problems:   Dyspnea   Sepsis (House)   History of lung cancer   Essential hypertension   Prolonged QT interval   Acute respiratory failure with hypoxia (HCC)  Acute hypoxic respiratory failure due to multifocal pneumonia requiring mechanical intubation/Severe sepsis secondary to above.  (Present on admission) Patient presented with worsening shortness of breath, intubated in the ED. Patient admitted in ICU,  continued on antibiotics, subsequently extubated. CTA chest ruled out PE ,  but showed Multi focal pneumonia Patient is now on 2 L/ m , sat's 94%. LVEF 45 to 81% grade 2 diastolic dysfunction on echo. Blood cultures no growth.  Change antibiotics to ceftriaxone and Zithromax x 7 days. Respiratory panel negative.  Strep pneumo and Legionella negative. Patient found to have wheezing in setting of her tobacco abuse, possible COPD exacerbation Continue bronchodilators and  solumedrol. Sepsis physiology resolved.  He is weaned down to room air sats 100%. Encourage out of bed to the chair.  Acute encephalopathy could be secondary to hypoxia.: Continued to have intermittent confusion could be due to hypoxia > now resolved. Patient has developed facial asymmetry, MRI ruled out acute stroke EEG negative for seizures.  Neuro exam nonfocal. Patient is back to her baseline mental status.  History of lung cancer s/p right upper lobe lobectomy and radiation: Continue outpatient oncology follow-up.  Hypertension: Continue losartan, propranolol. Continue hydralazine as needed.  Hyperlipidemia: Continue statins.  Bipolar disorder: Continue lithium.  Anxiety disorder: Continue Klonopin.   DVT prophylaxis: Lovenox Code Status: Full code Family Communication: No family at bedside Disposition Plan:   Status is: Inpatient  Remains inpatient appropriate because:Inpatient level of care appropriate due to severity of illness  Dispo: The patient is from: Home              Anticipated d/c is to: Home              Patient currently is not medically stable to d/c.   Difficult to place patient No   Consultants:  Neurology PCCM  Procedures: MRI Antimicrobials:   Anti-infectives (From admission, onward)    Start     Dose/Rate Route Frequency Ordered Stop   07/03/21 1600  cefTRIAXone (ROCEPHIN) 1 g in sodium chloride 0.9 % 100 mL IVPB        1 g 200 mL/hr over 30 Minutes Intravenous Every 24 hours 07/03/21 1422 07/04/21 1649   07/03/21 1515  azithromycin (ZITHROMAX) tablet 500 mg  Status:  Discontinued        500 mg Oral Daily 07/03/21 1421 07/04/21  1407   07/01/21 1800  ceFEPIme (MAXIPIME) 2 g in sodium chloride 0.9 % 100 mL IVPB  Status:  Discontinued        2 g 200 mL/hr over 30 Minutes Intravenous Every 12 hours 07/01/21 0750 07/03/21 1417   06/28/21 1800  vancomycin (VANCOREADY) IVPB 1750 mg/350 mL  Status:  Discontinued        1,750 mg 175 mL/hr  over 120 Minutes Intravenous Every 24 hours 06/28/21 0727 06/28/21 1356   06/28/21 0700  ceFEPIme (MAXIPIME) 1 g in sodium chloride 0.9 % 100 mL IVPB  Status:  Discontinued        1 g 200 mL/hr over 30 Minutes Intravenous Every 8 hours 06/28/21 0619 06/28/21 0644   06/28/21 0700  ceFEPIme (MAXIPIME) 2 g in sodium chloride 0.9 % 100 mL IVPB  Status:  Discontinued        2 g 200 mL/hr over 30 Minutes Intravenous Every 8 hours 06/28/21 0645 07/01/21 0750   06/28/21 0200  ceFEPIme (MAXIPIME) 2 g in sodium chloride 0.9 % 100 mL IVPB        2 g 200 mL/hr over 30 Minutes Intravenous  Once 06/28/21 0151 06/28/21 0250   06/28/21 0200  vancomycin (VANCOCIN) IVPB 1000 mg/200 mL premix        1,000 mg 200 mL/hr over 60 Minutes Intravenous  Once 06/28/21 0151 06/28/21 0345        Subjective: Patient was seen and examined at bedside. Overnight events noted.   Patient seems very comfortable,  sitting on the chair having breakfast.   Facial asymmetry has resolved and stroke was ruled out.    Objective: Vitals:   07/05/21 0524 07/05/21 0828 07/05/21 0855 07/05/21 1100  BP: (!) 149/95     Pulse: 90     Resp: 19     Temp: 98.5 F (36.9 C)     TempSrc: Oral     SpO2: 100% 95% (!) 89% 100%  Weight:      Height:        Intake/Output Summary (Last 24 hours) at 07/05/2021 1343 Last data filed at 07/05/2021 1255 Gross per 24 hour  Intake 659.01 ml  Output 1450 ml  Net -790.99 ml   Filed Weights   06/30/21 0800  Weight: 86.9 kg    Examination:  General exam: Appears comfortable, not in any acute distress.   Respiratory system: Clear to auscultation bilaterally, no wheezing. Cardiovascular system: S1-S2 heard, regular rate and rhythm, no murmur.   Gastrointestinal system: Abdomen is nondistended, soft and nontender.  Normal bowel sounds heard. Central nervous system: Alert and oriented x3, no focal neurological deficits. Extremities: No edema, no cyanosis, no clubbing. Skin: No rashes,  lesions or ulcers Psychiatry: Mood and affect appropriate.    Data Reviewed: I have personally reviewed following labs and imaging studies  CBC: Recent Labs  Lab 07/01/21 0245 07/02/21 0306 07/03/21 0254 07/04/21 0358 07/05/21 0812  WBC 13.0* 13.6* 17.4* 17.9* 18.7*  NEUTROABS  --   --   --  14.9*  --   HGB 14.4 13.4 14.6 15.7 16.9  HCT 47.2 42.6 46.4 49.2 53.8*  MCV 90.6 87.8 87.9 87.9 88.5  PLT 235 250 315 355 295   Basic Metabolic Panel: Recent Labs  Lab 06/30/21 0249 07/01/21 0245 07/02/21 0306 07/03/21 0254 07/04/21 0358 07/05/21 0901  NA 140 141 144 144 146* 146*  K 4.9 4.2 3.8 3.7 3.1* 3.2*  CL 108 109 111 110 112* 113*  CO2  26 22 23 24 25 28   GLUCOSE 97 121* 89 141* 147* 157*  BUN 28* 38* 33* 38* 35* 28*  CREATININE 1.04 1.15 1.11 1.18 1.11 1.14  CALCIUM 8.6* 8.5* 8.3* 8.2* 8.4* 8.7*  MG 2.5* 2.4  --   --   --   --   PHOS 3.3 3.4  --   --   --   --    GFR: Estimated Creatinine Clearance: 49.8 mL/min (by C-G formula based on SCr of 1.14 mg/dL). Liver Function Tests: Recent Labs  Lab 06/29/21 0304  AST 22  ALT 18  ALKPHOS 38  BILITOT 0.7  PROT 5.9*  ALBUMIN 3.2*   No results for input(s): LIPASE, AMYLASE in the last 168 hours. No results for input(s): AMMONIA in the last 168 hours. Coagulation Profile: No results for input(s): INR, PROTIME in the last 168 hours.  Cardiac Enzymes: No results for input(s): CKTOTAL, CKMB, CKMBINDEX, TROPONINI in the last 168 hours. BNP (last 3 results) No results for input(s): PROBNP in the last 8760 hours. HbA1C: Recent Labs    07/02/21 1758  HGBA1C 5.7*   CBG: Recent Labs  Lab 07/04/21 1603 07/04/21 2116 07/05/21 0334 07/05/21 0802 07/05/21 1247  GLUCAP 141* 150* 120* 131* 155*   Lipid Profile: Recent Labs    07/02/21 1758  LDLDIRECT 32.8   Thyroid Function Tests: No results for input(s): TSH, T4TOTAL, FREET4, T3FREE, THYROIDAB in the last 72 hours. Anemia Panel: No results for input(s):  VITAMINB12, FOLATE, FERRITIN, TIBC, IRON, RETICCTPCT in the last 72 hours. Sepsis Labs: Recent Labs  Lab 06/29/21 0936 07/04/21 1538  PROCALCITON  --  <0.10  LATICACIDVEN 1.3  --     Recent Results (from the past 240 hour(s))  Resp Panel by RT-PCR (Flu A&B, Covid) Nasopharyngeal Swab     Status: None   Collection Time: 06/28/21  1:00 AM   Specimen: Nasopharyngeal Swab; Nasopharyngeal(NP) swabs in vial transport medium  Result Value Ref Range Status   SARS Coronavirus 2 by RT PCR NEGATIVE NEGATIVE Final    Comment: (NOTE) SARS-CoV-2 target nucleic acids are NOT DETECTED.  The SARS-CoV-2 RNA is generally detectable in upper respiratory specimens during the acute phase of infection. The lowest concentration of SARS-CoV-2 viral copies this assay can detect is 138 copies/mL. A negative result does not preclude SARS-Cov-2 infection and should not be used as the sole basis for treatment or other patient management decisions. A negative result may occur with  improper specimen collection/handling, submission of specimen other than nasopharyngeal swab, presence of viral mutation(s) within the areas targeted by this assay, and inadequate number of viral copies(<138 copies/mL). A negative result must be combined with clinical observations, patient history, and epidemiological information. The expected result is Negative.  Fact Sheet for Patients:  EntrepreneurPulse.com.au  Fact Sheet for Healthcare Providers:  IncredibleEmployment.be  This test is no t yet approved or cleared by the [place] FDA and  has been authorized for detection and/or diagnosis of SARS-CoV-2 by FDA under an Emergency Use Authorization (EUA). This EUA will remain  in effect (meaning this test can be used) for the duration of the COVID-19 declaration under Section 564(b)(1) of the Act, 21 U.S.C.section 360bbb-3(b)(1), unless the authorization is terminated  or revoked  sooner.       Influenza A by PCR NEGATIVE NEGATIVE Final   Influenza B by PCR NEGATIVE NEGATIVE Final    Comment: (NOTE) The Xpert Xpress SARS-CoV-2/FLU/RSV plus assay is intended as an aid in the diagnosis  of influenza from Nasopharyngeal swab specimens and should not be used as a sole basis for treatment. Nasal washings and aspirates are unacceptable for Xpert Xpress SARS-CoV-2/FLU/RSV testing.  Fact Sheet for Patients: EntrepreneurPulse.com.au  Fact Sheet for Healthcare Providers: IncredibleEmployment.be  This test is not yet approved or cleared by the [place] FDA and has been authorized for detection and/or diagnosis of SARS-CoV-2 by FDA under an Emergency Use Authorization (EUA). This EUA will remain in effect (meaning this test can be used) for the duration of the COVID-19 declaration under Section 564(b)(1) of the Act, 21 U.S.C. section 360bbb-3(b)(1), unless the authorization is terminated or revoked.  Performed at [facility], [place] [address], [place], [place] [address]   Blood Culture (routine x 2)     Status: None   Collection Time: 06/28/21  1:00 AM   Specimen: BLOOD  Result Value Ref Range Status   Specimen Description   Final    BLOOD BLOOD LEFT ARM Performed at [facility] [address], [place], [place] [address]    Special Requests   Final    BOTTLES DRAWN AEROBIC AND ANAEROBIC Blood Culture adequate volume Performed at [facility] [address] [place], [place] [address]    Culture   Final    NO GROWTH 5 DAYS Performed at [facility], [place] [address], [place], [place] [address]    Report Status 07/03/2021 FINAL  Final  Blood Culture (routine x 2)     Status: None   Collection Time: 06/28/21  1:00 AM   Specimen: BLOOD  Result Value Ref Range Status   Specimen Description   Final    BLOOD BLOOD RIGHT ARM Performed at [facility] [address], [place], [place] [address]    Special Requests   Final    BOTTLES DRAWN AEROBIC AND ANAEROBIC Blood Culture adequate volume Performed at [facility] [address], [place], [place] [address]    Culture   Final    NO GROWTH 5 DAYS Performed at [facility], [place] [address], [place], [place] [address]    Report Status 07/03/2021 FINAL  Final  Urine Culture     Status: Abnormal   Collection Time: 06/28/21  4:50 AM   Specimen: In/Out Cath Urine  Result Value Ref Range Status   Specimen Description   Final    IN/OUT CATH URINE Performed at [facility] [address], [place], [place] [address]    Special Requests   Final    NONE Performed at [facility], [place] [address], [place], [place] [address]    Culture (A)  Final    <10,000 COLONIES/mL INSIGNIFICANT GROWTH Performed at [facility] [address] [place], [place] [address]    Report Status 06/29/2021 FINAL  Final  MRSA Next Gen by PCR, Nasal     Status: None   Collection Time: 06/28/21  8:23 AM   Specimen: Nasal Mucosa; Nasal Swab  Result Value Ref Range Status   MRSA by PCR Next Gen NOT DETECTED NOT DETECTED Final    Comment: (NOTE) The GeneXpert MRSA Assay (FDA approved for NASAL specimens only), is one component of a comprehensive MRSA colonization surveillance program. It is not intended to diagnose MRSA infection nor to guide or monitor treatment for MRSA infections. Test performance is not FDA approved in patients less than 24 years old. Performed at [facility],  [place] [address], [place], [place] [address]   Respiratory (~20 pathogens) panel by PCR     Status: None   Collection Time: 06/29/21  1:24 PM   Specimen: Nasopharyngeal Swab; Respiratory  Result Value Ref Range Status   Adenovirus NOT DETECTED NOT DETECTED Final   Coronavirus 229E NOT DETECTED NOT DETECTED Final    Comment: (NOTE) The  Coronavirus on the Respiratory Panel, DOES NOT test for the novel  Coronavirus (2019 nCoV)    Coronavirus HKU1 NOT DETECTED NOT DETECTED Final   Coronavirus NL63 NOT DETECTED NOT DETECTED Final   Coronavirus OC43 NOT DETECTED NOT DETECTED Final   Metapneumovirus NOT DETECTED NOT DETECTED Final   Rhinovirus / Enterovirus NOT DETECTED NOT DETECTED Final   Influenza A NOT DETECTED NOT DETECTED Final   Influenza B NOT DETECTED NOT DETECTED Final   Parainfluenza Virus 1 NOT DETECTED NOT DETECTED Final   Parainfluenza Virus 2 NOT DETECTED NOT DETECTED Final   Parainfluenza Virus 3 NOT DETECTED NOT DETECTED Final   Parainfluenza Virus 4 NOT DETECTED NOT DETECTED Final   Respiratory Syncytial Virus NOT DETECTED NOT DETECTED Final   Bordetella pertussis NOT DETECTED NOT DETECTED Final   Bordetella Parapertussis NOT DETECTED NOT DETECTED Final   Chlamydophila pneumoniae NOT DETECTED NOT DETECTED Final   Mycoplasma pneumoniae NOT DETECTED NOT DETECTED Final    Comment: Performed at [facility], [place]. [address], [place], [place] [address]  Culture, Respiratory w Gram Stain     Status: None   Collection Time: 06/30/21  4:15 PM   Specimen: Tracheal Aspirate; Respiratory  Result Value Ref Range Status   Specimen Description   Final    TRACHEAL ASPIRATE Performed at [facility] [address], [place], [place] [address]    Special Requests   Final    NONE Performed at [facility], [place] [address] [place], [place] [address]    Gram Stain   Final    FEW SQUAMOUS EPITHELIAL CELLS PRESENT FEW WBC SEEN FEW GRAM POSITIVE COCCI FEW GRAM NEGATIVE RODS FEW GRAM POSITIVE RODS    Culture   Final    FEW Normal respiratory flora-no Staph aureus or Pseudomonas seen Performed at [facility], [address] [place],  [address]    Report Status 07/03/2021 FINAL  Final    Radiology Studies: DG CHEST PORT 1 VIEW  Result Date:  07/04/2021 CLINICAL DATA:  Follow-up pneumonia. EXAM: PORTABLE CHEST 1 VIEW COMPARISON:  Radiograph 06/30/2021.  CT 06/28/2021 FINDINGS: Postsurgical change in the right hemithorax with surgical clips and post treatment related change at the hilum and volume loss. Improved lung aeration from prior exam. Lung opacities on prior CT are not well seen by radiograph, however overall decreased basilar opacity. Stable heart size. No pneumothorax or large pleural effusion. IMPRESSION: Improved lung aeration from prior exam. Bibasilar opacities on prior CT are not well seen by radiograph, however are likely improved. Electronically Signed   By: Keith Rake M.D.   On: 07/04/2021 16:53    Scheduled Meds:  aspirin  81 mg Oral Daily   Chlorhexidine Gluconate Cloth  6 each Topical Daily   clonazePAM  0.5 mg Oral QHS   clopidogrel  75 mg Oral Daily   enoxaparin (LOVENOX) injection  40 mg Subcutaneous Daily   gabapentin  600 mg Oral Daily   And   gabapentin  900 mg Oral QHS   ipratropium-albuterol  3 mL Nebulization Q6H   lithium carbonate  300 mg Oral Q12H   losartan  50 mg Oral Daily   mouth rinse  15 mL Mouth Rinse BID   mometasone-formoterol  2 puff Inhalation BID   pantoprazole  40 mg Oral Daily   predniSONE  40 mg Oral Q breakfast   propranolol  10 mg Oral Daily   Continuous Infusions:  sodium chloride Stopped (07/01/21 1801)     LOS: 7 days    Time spent: 25 mins    Alvin Vieth, MD [facility]   If 7PM-7AM, please contact night-coverage

## 2021-07-05 NOTE — Plan of Care (Signed)
Neurology plan of care  D/w Dr. Dwyane Dee this AM. Patient's aphasia has resolved and is suspected to have been 2/2 encephalopathy not MRI-negative stroke. No further neurologic workup indicated at this time.  Su Monks, MD [facility] [phone]  If 7pm- 7am, please page neurology on call as listed in [facility].

## 2021-07-06 ENCOUNTER — Inpatient Hospital Stay (HOSPITAL_COMMUNITY): Payer: Medicare Other

## 2021-07-06 ENCOUNTER — Other Ambulatory Visit (HOSPITAL_COMMUNITY): Payer: Federal, State, Local not specified - PPO

## 2021-07-06 LAB — GLUCOSE, CAPILLARY
Glucose-Capillary: 111 mg/dL — ABNORMAL HIGH (ref 70–99)
Glucose-Capillary: 115 mg/dL — ABNORMAL HIGH (ref 70–99)
Glucose-Capillary: 125 mg/dL — ABNORMAL HIGH (ref 70–99)
Glucose-Capillary: 153 mg/dL — ABNORMAL HIGH (ref 70–99)
Glucose-Capillary: 217 mg/dL — ABNORMAL HIGH (ref 70–99)

## 2021-07-06 LAB — CBC
HCT: 50.9 % (ref 39.0–52.0)
Hemoglobin: 15.8 g/dL (ref 13.0–17.0)
MCH: 27.6 pg (ref 26.0–34.0)
MCHC: 31 g/dL (ref 30.0–36.0)
MCV: 88.8 fL (ref 80.0–100.0)
Platelets: 230 10*3/uL (ref 150–400)
RBC: 5.73 MIL/uL (ref 4.22–5.81)
RDW: 14.3 % (ref 11.5–15.5)
WBC: 13.7 10*3/uL — ABNORMAL HIGH (ref 4.0–10.5)
nRBC: 0 % (ref 0.0–0.2)

## 2021-07-06 LAB — BASIC METABOLIC PANEL
Anion gap: 8 (ref 5–15)
BUN: 26 mg/dL — ABNORMAL HIGH (ref 8–23)
CO2: 27 mmol/L (ref 22–32)
Calcium: 8.5 mg/dL — ABNORMAL LOW (ref 8.9–10.3)
Chloride: 111 mmol/L (ref 98–111)
Creatinine, Ser: 1.05 mg/dL (ref 0.61–1.24)
GFR, Estimated: >60 mL/min (ref >60)
Glucose, Bld: 113 mg/dL — ABNORMAL HIGH (ref 70–99)
Potassium: 3.1 mmol/L — ABNORMAL LOW (ref 3.5–5.1)
Sodium: 146 mmol/L — ABNORMAL HIGH (ref 135–145)

## 2021-07-06 LAB — PHOSPHORUS: Phosphorus: 3.2 mg/dL (ref 2.5–4.6)

## 2021-07-06 LAB — MAGNESIUM: Magnesium: 2.4 mg/dL (ref 1.7–2.4)

## 2021-07-06 NOTE — Progress Notes (Signed)
[place] was inserted and attached to a Foley Catheter. The patient tolerated the insertion very well.

## 2021-07-06 NOTE — Care Management Important Message (Signed)
Important Message  Patient Details IM Letter given to the Patient. Name: Alvin Hardy MRN: [id number] Date of Birth: 23-Apr-1935   Medicare Important Message Given:  Yes     Kerin Salen 07/06/2021, 1:48 PM

## 2021-07-06 NOTE — Progress Notes (Addendum)
PROGRESS NOTE    HENRIK ORIHUELA  TMH:[id number] DOB: 11-05-1934 DOA: 06/28/2021 PCP: Crist Infante, MD    Brief Narrative:  This 86 years old male with PMH significant for depression, essential hypertension, GERD, hyperlipidemia, hypogonadism, psoriatic arthritis, BPH, lung CA (in remission, s/p lobectomy )presented in the ED with fever and shortness of breath.  He is found to have multifocal pneumonia.  He was seen by PCP and started on doxycycline without any improvement.  He was subsequently placed on Zithromax, prednisone and albuterol despite this intervention. Patient continued to have worsening shortness of breath. Patient was admitted for acute hypoxic respiratory failure secondary to multifocal pneumonia requiring intubation.  Patient was admitted in ICU and subsequently extubated. He was noted to have acute expressive aphasia,  encephalopathy and confusion.  further work-up with brain MRI and EEG ruled out seizures and acute stroke. Neurology signed off.  Patient is awaiting SNF placement  Assessment & Plan:   Principal Problem:   Multifocal pneumonia Active Problems:   Dyspnea   Sepsis ([place])   History of lung cancer   Essential hypertension   Prolonged QT interval   Acute respiratory failure with hypoxia ([place])  Acute hypoxic respiratory failure due to multifocal pneumonia requiring mechanical intubation. Severe sepsis secondary to above.  (Present on admission) Patient presented with worsening shortness of breath, intubated in the ED. Patient admitted in ICU,  continued on antibiotics, subsequently extubated. CTA chest ruled out PE ,  but showed Multi focal pneumonia Patient is now on 2 L/ m [place], sat's 94%. LVEF 45 to 92% grade 2 diastolic dysfunction on echo. Blood cultures no growth.  Completed antibiotics  x 7 days Respiratory panel negative.  Strep pneumo and Legionella negative. Patient found to have wheezing in setting of her tobacco abuse, possible COPD  exacerbation Continue bronchodilators and solumedrol. Sepsis physiology resolved.  He is weaned down to room air sats 100%. Encourage out of bed to the chair.  Acute encephalopathy could be secondary to hypoxia.: Continued to have intermittent confusion could be due to hypoxia > now resolved. Patient has developed facial asymmetry, MRI ruled out acute stroke EEG negative for seizures.  Neuro exam nonfocal. Patient is back to her baseline mental status.  Neurology signed off.  History of lung cancer s/p right upper lobe lobectomy and radiation: Continue outpatient oncology follow-up.  Hypertension: Continue losartan, propranolol. Continue hydralazine as needed.  Hyperlipidemia: Continue statins.  Bipolar disorder: Continue lithium.  Anxiety disorder: Continue Klonopin.  Acute urinary retention: Patient has developed acute urinary retention requiring Foley catheter. Obtain renal ultrasound, continue Flomax Consider voiding trial.  DVT prophylaxis: Lovenox Code Status: Full code Family Communication: No family at bedside Disposition Plan:   Status is: Inpatient  Remains inpatient appropriate because:Inpatient level of care appropriate due to severity of illness  Dispo: The patient is from: Home              Anticipated d/c is to: SNF              Patient currently is not medically stable to d/c.   Difficult to place patient No   Consultants:  Neurology PCCM  Procedures: MRI Antimicrobials:   Anti-infectives (From admission, onward)    Start     Dose/Rate Route Frequency Ordered Stop   07/03/21 1600  cefTRIAXone (ROCEPHIN) 1 g in sodium chloride 0.9 % 100 mL IVPB        1 g 200 mL/hr over 30 Minutes Intravenous Every 24 hours 07/03/21 1422  07/04/21 1649   07/03/21 1515  azithromycin (ZITHROMAX) tablet 500 mg  Status:  Discontinued        500 mg Oral Daily 07/03/21 1421 07/04/21 1407   07/01/21 1800  ceFEPIme (MAXIPIME) 2 g in sodium chloride 0.9 % 100 mL IVPB   Status:  Discontinued        2 g 200 mL/hr over 30 Minutes Intravenous Every 12 hours 07/01/21 0750 07/03/21 1417   06/28/21 1800  vancomycin (VANCOREADY) IVPB 1750 mg/350 mL  Status:  Discontinued        1,750 mg 175 mL/hr over 120 Minutes Intravenous Every 24 hours 06/28/21 0727 06/28/21 1356   06/28/21 0700  ceFEPIme (MAXIPIME) 1 g in sodium chloride 0.9 % 100 mL IVPB  Status:  Discontinued        1 g 200 mL/hr over 30 Minutes Intravenous Every 8 hours 06/28/21 0619 06/28/21 0644   06/28/21 0700  ceFEPIme (MAXIPIME) 2 g in sodium chloride 0.9 % 100 mL IVPB  Status:  Discontinued        2 g 200 mL/hr over 30 Minutes Intravenous Every 8 hours 06/28/21 0645 07/01/21 0750   06/28/21 0200  ceFEPIme (MAXIPIME) 2 g in sodium chloride 0.9 % 100 mL IVPB        2 g 200 mL/hr over 30 Minutes Intravenous  Once 06/28/21 0151 06/28/21 0250   06/28/21 0200  vancomycin (VANCOCIN) IVPB 1000 mg/200 mL premix        1,000 mg 200 mL/hr over 60 Minutes Intravenous  Once 06/28/21 0151 06/28/21 0345        Subjective: Patient was seen and examined at bedside. Overnight events noted.   Patient was sitting comfortably on the bed, reports feeling better. Facial asymmetry has resolved and stroke was ruled out. His abdomen seems distended but denies any pain,  able to pass flatus.    Objective: Vitals:   07/05/21 2012 07/05/21 2016 07/06/21 0401 07/06/21 1149  BP:  (!) 145/81 123/67 116/72  Pulse:  99 98 87  Resp:  20 20 20   Temp:  99 F (37.2 C) (!) 97.4 F (36.3 C) 97.9 F (36.6 C)  TempSrc:  Oral Oral Oral  SpO2: 92% 92% 95% 94%  Weight:      Height:        Intake/Output Summary (Last 24 hours) at 07/06/2021 1245 Last data filed at 07/06/2021 0800 Gross per 24 hour  Intake 200 ml  Output 675 ml  Net -475 ml   Filed Weights   06/30/21 0800  Weight: 86.9 kg    Examination:  General exam: Appears comfortable, not in any acute distress. Respiratory system: Clear to auscultation  bilaterally, no wheezing. Cardiovascular system: S1-S2 heard, regular rate and rhythm, no murmur.   Gastrointestinal system: Abdomen is distended, soft and nontender.  Normal bowel sounds heard. Central nervous system: Alert and oriented x 3, no focal neurological deficits. Extremities: No edema, no cyanosis, no clubbing. Skin: No rashes, lesions or ulcers Psychiatry: Mood and affect appropriate.    Data Reviewed: I have personally reviewed following labs and imaging studies  CBC: Recent Labs  Lab 07/02/21 0306 07/03/21 0254 07/04/21 0358 07/05/21 0812 07/06/21 0321  WBC 13.6* 17.4* 17.9* 18.7* 13.7*  NEUTROABS  --   --  14.9*  --   --   HGB 13.4 14.6 15.7 16.9 15.8  HCT 42.6 46.4 49.2 53.8* 50.9  MCV 87.8 87.9 87.9 88.5 88.8  PLT 250 315 355 302 230  Basic Metabolic Panel: Recent Labs  Lab 06/30/21 0249 07/01/21 0245 07/02/21 0306 07/03/21 0254 07/04/21 0358 07/05/21 0901 07/06/21 0321  NA 140 141 144 144 146* 146* 146*  K 4.9 4.2 3.8 3.7 3.1* 3.2* 3.1*  CL 108 109 111 110 112* 113* 111  CO2 26 22 23 24 25 28 27   GLUCOSE 97 121* 89 141* 147* 157* 113*  BUN 28* 38* 33* 38* 35* 28* 26*  CREATININE 1.04 1.15 1.11 1.18 1.11 1.14 1.05  CALCIUM 8.6* 8.5* 8.3* 8.2* 8.4* 8.7* 8.5*  MG 2.5* 2.4  --   --   --   --  2.4  PHOS 3.3 3.4  --   --   --   --  3.2   GFR: Estimated Creatinine Clearance: 54.1 mL/min (by C-G formula based on SCr of 1.05 mg/dL). Liver Function Tests: No results for input(s): AST, ALT, ALKPHOS, BILITOT, PROT, ALBUMIN in the last 168 hours.  No results for input(s): LIPASE, AMYLASE in the last 168 hours. No results for input(s): AMMONIA in the last 168 hours. Coagulation Profile: No results for input(s): INR, PROTIME in the last 168 hours.  Cardiac Enzymes: No results for input(s): CKTOTAL, CKMB, CKMBINDEX, TROPONINI in the last 168 hours. BNP (last 3 results) No results for input(s): PROBNP in the last 8760 hours. HbA1C: No results for  input(s): HGBA1C in the last 72 hours.  CBG: Recent Labs  Lab 07/05/21 2013 07/06/21 0000 07/06/21 0358 07/06/21 0751 07/06/21 1150  GLUCAP 142* 115* 111* 125* 153*   Lipid Profile: No results for input(s): CHOL, HDL, LDLCALC, TRIG, CHOLHDL, LDLDIRECT in the last 72 hours.  Thyroid Function Tests: No results for input(s): TSH, T4TOTAL, FREET4, T3FREE, THYROIDAB in the last 72 hours. Anemia Panel: No results for input(s): VITAMINB12, FOLATE, FERRITIN, TIBC, IRON, RETICCTPCT in the last 72 hours. Sepsis Labs: Recent Labs  Lab 07/04/21 1538  PROCALCITON <0.10    Recent Results (from the past 240 hour(s))  Resp Panel by RT-PCR (Flu A&B, Covid) Nasopharyngeal Swab     Status: None   Collection Time: 06/28/21  1:00 AM   Specimen: Nasopharyngeal Swab; Nasopharyngeal(NP) swabs in vial transport medium  Result Value Ref Range Status   SARS Coronavirus 2 by RT PCR NEGATIVE NEGATIVE Final    Comment: (NOTE) SARS-CoV-2 target nucleic acids are NOT DETECTED.  The SARS-CoV-2 RNA is generally detectable in upper respiratory specimens during the acute phase of infection. The lowest concentration of SARS-CoV-2 viral copies this assay can detect is 138 copies/mL. A negative result does not preclude SARS-Cov-2 infection and should not be used as the sole basis for treatment or other patient management decisions. A negative result may occur with  improper specimen collection/handling, submission of specimen other than nasopharyngeal swab, presence of viral mutation(s) within the areas targeted by this assay, and inadequate number of viral copies(<138 copies/mL). A negative result must be combined with clinical observations, patient history, and epidemiological information. The expected result is Negative.  Fact Sheet for Patients:  EntrepreneurPulse.com.au  Fact Sheet for Healthcare Providers:  IncredibleEmployment.be  This test is no t yet  approved or cleared by the [place] FDA and  has been authorized for detection and/or diagnosis of SARS-CoV-2 by FDA under an Emergency Use Authorization (EUA). This EUA will remain  in effect (meaning this test can be used) for the duration of the COVID-19 declaration under Section 564(b)(1) of the Act, 21 U.S.C.section 360bbb-3(b)(1), unless the authorization is terminated  or revoked sooner.  Influenza A by PCR NEGATIVE NEGATIVE Final   Influenza B by PCR NEGATIVE NEGATIVE Final    Comment: (NOTE) The Xpert Xpress SARS-CoV-2/FLU/RSV plus assay is intended as an aid in the diagnosis of influenza from Nasopharyngeal swab specimens and should not be used as a sole basis for treatment. Nasal washings and aspirates are unacceptable for Xpert Xpress SARS-CoV-2/FLU/RSV testing.  Fact Sheet for Patients: EntrepreneurPulse.com.au  Fact Sheet for Healthcare Providers: IncredibleEmployment.be  This test is not yet approved or cleared by the [place] FDA and has been authorized for detection and/or diagnosis of SARS-CoV-2 by FDA under an Emergency Use Authorization (EUA). This EUA will remain in effect (meaning this test can be used) for the duration of the COVID-19 declaration under Section 564(b)(1) of the Act, 21 U.S.C. section 360bbb-3(b)(1), unless the authorization is terminated or revoked.  Performed at [facility], [place] [address], [place], [place] [address]   Blood Culture (routine x 2)     Status: None   Collection Time: 06/28/21  1:00 AM   Specimen: BLOOD  Result Value Ref Range Status   Specimen Description   Final    BLOOD BLOOD LEFT ARM Performed at [place] [address], [place], [place] [address]    Special Requests   Final    BOTTLES DRAWN AEROBIC AND ANAEROBIC Blood Culture adequate volume Performed at [place] [address], [place],  [place] [address]    Culture   Final    NO GROWTH 5 DAYS Performed at [facility], [place] [address], [place], [place] [address]    Report Status 07/03/2021 FINAL  Final  Blood Culture (routine x 2)     Status: None   Collection Time: 06/28/21  1:00 AM   Specimen: BLOOD  Result Value Ref Range Status   Specimen Description   Final    BLOOD BLOOD RIGHT ARM Performed at [place] [address] [place], [place] [address]    Special Requests   Final    BOTTLES DRAWN AEROBIC AND ANAEROBIC Blood Culture adequate volume Performed at [place] [address] [place], [place] [address]    Culture   Final    NO GROWTH 5 DAYS Performed at [facility], [place] [address] [place], [place] [address]    Report Status 07/03/2021 FINAL  Final  Urine Culture     Status: Abnormal   Collection Time: 06/28/21  4:50 AM   Specimen: In/Out Cath Urine  Result Value Ref Range Status   Specimen Description   Final    IN/OUT CATH URINE Performed at [place] [address], [place], [place] [address]    Special Requests   Final    NONE Performed at [facility], [place] [address], [place], [place] [address]    Culture (A)  Final    <10,000 COLONIES/mL INSIGNIFICANT GROWTH Performed at [place] [address] [place], [place] [address]    Report Status 06/29/2021 FINAL  Final  MRSA Next Gen by PCR, Nasal     Status: None   Collection Time: 06/28/21  8:23 AM   Specimen: Nasal Mucosa; Nasal Swab  Result Value Ref Range Status   MRSA by PCR Next Gen NOT DETECTED NOT DETECTED Final    Comment: (NOTE) The GeneXpert MRSA Assay (FDA approved for NASAL specimens only), is one component of a comprehensive MRSA colonization surveillance program. It  is not intended to diagnose MRSA infection nor to guide or monitor treatment for MRSA infections. Test performance is not FDA approved in patients less than 21  years old. Performed at [facility], [place] [address], [place], [place] [address]   Respiratory (~20 pathogens) panel by PCR     Status: None   Collection Time: 06/29/21  1:24 PM   Specimen: Nasopharyngeal Swab; Respiratory  Result Value Ref Range Status   Adenovirus NOT DETECTED NOT DETECTED Final   Coronavirus 229E NOT DETECTED NOT DETECTED Final    Comment: (NOTE) The Coronavirus on the Respiratory Panel, DOES NOT test for the novel  Coronavirus (2019 nCoV)    Coronavirus HKU1 NOT DETECTED NOT DETECTED Final   Coronavirus NL63 NOT DETECTED NOT DETECTED Final   Coronavirus OC43 NOT DETECTED NOT DETECTED Final   Metapneumovirus NOT DETECTED NOT DETECTED Final   Rhinovirus / Enterovirus NOT DETECTED NOT DETECTED Final   Influenza A NOT DETECTED NOT DETECTED Final   Influenza B NOT DETECTED NOT DETECTED Final   Parainfluenza Virus 1 NOT DETECTED NOT DETECTED Final   Parainfluenza Virus 2 NOT DETECTED NOT DETECTED Final   Parainfluenza Virus 3 NOT DETECTED NOT DETECTED Final   Parainfluenza Virus 4 NOT DETECTED NOT DETECTED Final   Respiratory Syncytial Virus NOT DETECTED NOT DETECTED Final   Bordetella pertussis NOT DETECTED NOT DETECTED Final   Bordetella Parapertussis NOT DETECTED NOT DETECTED Final   Chlamydophila pneumoniae NOT DETECTED NOT DETECTED Final   Mycoplasma pneumoniae NOT DETECTED NOT DETECTED Final    Comment: Performed at [facility], [place]. [address], [place], [place] [address]  Culture, Respiratory w Gram Stain     Status: None   Collection Time: 06/30/21  4:15 PM   Specimen: Tracheal Aspirate; Respiratory  Result Value Ref Range Status   Specimen Description   Final    TRACHEAL ASPIRATE Performed at [place] [address] [place], [place] [address]    Special Requests   Final    NONE Performed at [facility], [place] [address] [place], [place] [address]    Gram Stain   Final    FEW  SQUAMOUS EPITHELIAL CELLS PRESENT FEW WBC SEEN FEW GRAM POSITIVE COCCI FEW GRAM NEGATIVE RODS FEW GRAM POSITIVE RODS    Culture   Final    FEW Normal respiratory flora-no Staph aureus or Pseudomonas seen Performed at [facility], [address] [place], [place] [address]    Report Status 07/03/2021 FINAL  Final    Radiology Studies: DG CHEST PORT 1 VIEW  Result Date: 07/04/2021 CLINICAL DATA:  Follow-up pneumonia. EXAM: PORTABLE CHEST 1 VIEW COMPARISON:  Radiograph 06/30/2021.  CT 06/28/2021 FINDINGS: Postsurgical change in the right hemithorax with surgical clips and post treatment related change at the hilum and volume loss. Improved lung aeration from prior exam. Lung opacities on prior CT are not well seen by radiograph, however overall decreased basilar opacity. Stable heart size. No pneumothorax or large pleural effusion. IMPRESSION: Improved lung aeration from prior exam. Bibasilar opacities on prior CT are not well seen by radiograph, however are likely improved. Electronically Signed   By: Keith Rake M.D.   On: 07/04/2021 16:53   DG Abd Portable 1V  Result Date: 07/05/2021 CLINICAL DATA:  Abdominal distention EXAM: PORTABLE ABDOMEN - 1 VIEW COMPARISON:  06/29/2021 FINDINGS: The nasogastric tube has been removed. The stomach remains decompressed. Small bowel decompressed. There is gaseous distention of the  colon with oral contrast in the appendix and ascending segment. Mild degenerative changes in the lower lumbar spine. IMPRESSION: Gaseous distention of colon Electronically Signed   By: Lucrezia Europe M.D.   On: 07/05/2021 13:49    Scheduled Meds:  aspirin  81 mg Oral Daily   Chlorhexidine Gluconate Cloth  6 each Topical Daily   clonazePAM  0.5 mg Oral QHS   clopidogrel  75 mg Oral Daily   enoxaparin (LOVENOX) injection  40 mg Subcutaneous Daily   gabapentin  600 mg Oral Daily   And   gabapentin  900 mg Oral QHS   lithium carbonate  300 mg Oral Q12H   losartan  50 mg  Oral Daily   mouth rinse  15 mL Mouth Rinse BID   mometasone-formoterol  2 puff Inhalation BID   pantoprazole  40 mg Oral Daily   predniSONE  40 mg Oral Q breakfast   propranolol  10 mg Oral Daily   tamsulosin  0.8 mg Oral Daily   Continuous Infusions:  sodium chloride Stopped (07/01/21 1801)     LOS: 8 days    Time spent: 25 mins    Sheyli Horwitz, MD [facility]   If 7PM-7AM, please contact night-coverage

## 2021-07-06 NOTE — Progress Notes (Signed)
  Speech Language Pathology Treatment: Dysphagia  Patient Details Name: SVEN PINHEIRO MRN: [id number] DOB: 1935-05-31 Today's Date: 07/06/2021 Time: 1100-1115 SLP Time Calculation (min) (ACUTE ONLY): 15 min  Assessment / Plan / Recommendation Clinical Impression  Patient was initially being seen for SLE, however as he appears significantly improved cognitively and is currently not showing any signs of aphasia as had been observed previously, SLP observed patient with trials of Dys 3 solids and thin liquids. SLP reviewed MBS from 9/1 prior to PO trials. During this treatment session, patient was able to feed self and drank water(thin) via straw sips and ate dys 3 (nutrigrain/soft cereal bar). No significant delays during oral phase and swallow initiation with solids and liquids appeared timely. Patient has a chronic congested cough which he did exhibit during PO trials, however do not highly suspect cough is related to PO intake. SLP is recommending to advance patient's diet to Dys 3 solids, thin liquids.   HPI HPI: Patient is an 86 years old male who presented in the ED on 06/28/21 with fever and shortness of breath andadmitted for acute hypoxic respiratory failure due to multifocal pneumonia requiring mechanical intubation/Severe sepsis.  Pt was also noted to have acute expressive aphasia, encephalopathy and confusion, however had further work-up with brain MRI and EEG ruled out seizures and acute stroke.  PMH significant for depression, essential hypertension, GERD, hyperlipidemia, hypogonadism, psoriatic arthritis, BPH, lung CA (in remission, s/p lobectomy), VATS. MBS completed 9/1 stated patient with mild oral phase and WFL pharyngeal phase swallow function.      SLP Plan  Continue with current plan of care  Patient does not need any further Speech Lanaguage Pathology Services (no further services for speech/language, but briefly for dysphagia)    Recommendations  Diet recommendations:  Dysphagia 3 (mechanical soft);Thin liquid Liquids provided via: Cup;Straw Medication Administration: Whole meds with puree Supervision: Patient able to self feed Compensations: Slow rate;Small sips/bites Postural Changes and/or Swallow Maneuvers: Upright 30-60 min after meal;Seated upright 90 degrees                Follow up Recommendations: Other (comment) (TBD) SLP Visit Diagnosis: Dysphagia, unspecified (R13.10) Plan: Continue with current plan of care       Sonia Baller, MA, CCC-SLP Speech Therapy

## 2021-07-06 NOTE — Evaluation (Signed)
Speech Language Pathology Evaluation Patient Details Name: KOAL ESLINGER MRN: [id number] DOB: 1935/03/28 Today's Date: 07/06/2021 Time: 1045-1100 SLP Time Calculation (min) (ACUTE ONLY): 15 min  Problem List:  Patient Active Problem List   Diagnosis Date Noted   Acute respiratory failure with hypoxia ([place])    Multifocal pneumonia 06/28/2021   Sepsis ([place]) 06/28/2021   History of lung cancer 06/28/2021   Essential hypertension 06/28/2021   Prolonged QT interval 06/28/2021   Osteoarthritis of left glenohumeral joint 12/22/2018   Aortic atherosclerosis ([place]) 07/10/2018   Bursitis of elbow 07/10/2018   Diarrhea 07/10/2018   DJD (degenerative joint disease) 07/10/2018   Finger pain 07/10/2018   Foot pain 07/10/2018   GERD (gastroesophageal reflux disease) 07/10/2018   Hoarseness 07/10/2018   Impaired fasting glucose 07/10/2018   Low back pain 07/10/2018   Neck mass 07/10/2018   Obesity 07/10/2018   Other psoriasis 07/10/2018   Postnasal drip 07/10/2018   Prostatic hypertrophy 07/10/2018   Rectal bleeding 07/10/2018   Sinusitis 07/10/2018   Sore throat 07/10/2018   UTI (urinary tract infection) 07/10/2018   Dyspnea 02/04/2014   Hyperlipidemia 01/25/2014   Chest pain 01/25/2014   Claudication ([place]) 01/25/2014   BPH (benign prostatic hyperplasia)    Hypogonadism male    Bipolar 1 disorder ([place])    ED (erectile dysfunction)    Neuromuscular disorder (HCC)    Laryngopharyngeal reflux    Gilbert's syndrome    Morton's neuroma    Lung cancer, Right Upper Lobe    Past Medical History:  Past Medical History:  Diagnosis Date   Allergy    Arthritis    psoriatic arthritis, ? , treated /w mmethotrexate    Bipolar 1 disorder ([place])    BPH (benign prostatic hyperplasia)    Cataract 2010   Bilateral   Chest pain    Depression    Deviated septum    TO THE LEFT   ED (erectile dysfunction)    Essential hypertension 06/28/2021   Finger injury    mallet finger-- 02/13/2013, cast  in place, followed by dr. Fredna Dow   GERD (gastroesophageal reflux disease)    Gilbert's syndrome    History of radiation therapy 07/24/13-08/27/13   50Gy/25 fx chest   Hyperlipidemia    Hypogonadism male    Laryngopharyngeal reflux    lung ca dx'd 12/2012   rul   Lung mass 02/07/13   RIGHT UPPER LOBE   Morton's neuroma    LEFT FOOT   Neuromuscular disorder ([place])    peripheral neuropathy   Shortness of breath    voice breaks, ? related to reflux, although told by Dr. Ardis Hughs, S.- early 23 yr. old   Past Surgical History:  Past Surgical History:  Procedure Laterality Date   CYSTOSCOPY N/A 02/21/2013   Procedure: CYSTOSCOPY FLEXIBLE with insertion of foley catheter;  Surgeon: Fredricka Bonine, MD;  Location: [facility];  Service: Urology;  Laterality: N/A;   EYE SURGERY     cataracts removed fr. both eyes, IOL in place    KNEE ARTHROSCOPY Right 01/2007   PILONIDAL CYST EXCISION  1960's   PROSTATE SURGERY  1996   TURP   VIDEO ASSISTED THORACOSCOPY (VATS)/WEDGE RESECTION Right 02/21/2013   Procedure: Right VIDEO ASSISTED THORACOSCOPY ,Thoracotomy with right upper lobectomy, node sampling ;  Surgeon: Grace Isaac, MD;  Location: [facility];  Service: Thoracic;  Laterality: Right;   VIDEO BRONCHOSCOPY N/A 02/21/2013   Procedure: VIDEO BRONCHOSCOPY;  Surgeon: Grace Isaac, MD;  Location: [facility];  Service: Thoracic;  Laterality: N/A;   HPI:  Patient is an 86 years old male who presented in the ED on 06/28/21 with fever and shortness of breath andadmitted for acute hypoxic respiratory failure due to multifocal pneumonia requiring mechanical intubation/Severe sepsis.  Pt was also noted to have acute expressive aphasia, encephalopathy and confusion, however had further work-up with brain MRI and EEG ruled out seizures and acute stroke.  PMH significant for depression, essential hypertension, GERD, hyperlipidemia, hypogonadism, psoriatic arthritis, BPH, lung CA (in remission, s/p lobectomy), VATS    Assessment / Plan / Recommendation Clinical Impression  Patient evaluated for cognition, speech, language abilities and appears to be at baseline. He was oriented x4 and demonstrated good overall awareness, problem solving and reasoning. He is not presenting with any aphasia which was observed previously and his speech and language abilities are both WNL at conversational level. Patient does present with a hoarse vocal quality which he reports is his baseline. MRI brain was negative for acute intracranial abnormality. SLP not recommending any f/u ST services as patient appears to be WFL-WNL for cognitive-linguistic abilities.    SLP Assessment  SLP Recommendation/Assessment: Patient does not need any further Speech Lanaguage Pathology Services (no further services for speech/language, but briefly for dysphagia) SLP Visit Diagnosis: Cognitive communication deficit (R41.841)    Follow Up Recommendations     N/A  Frequency and Duration   N/A        SLP Evaluation Cognition  Overall Cognitive Status: Within Functional Limits for tasks assessed Arousal/Alertness: Awake/alert Orientation Level: Oriented X4 Attention: Sustained Sustained Attention: Appears intact Memory: Appears intact Awareness: Appears intact Problem Solving: Appears intact       Comprehension  Auditory Comprehension Overall Auditory Comprehension: Appears within functional limits for tasks assessed    Expression Expression Primary Mode of Expression: Verbal Verbal Expression Overall Verbal Expression: Appears within functional limits for tasks assessed Initiation: No impairment Naming: No impairment Pragmatics: No impairment Non-Verbal Means of Communication: Not applicable   Oral / Motor  Oral Motor/Sensory Function Facial ROM: Within Functional Limits Facial Symmetry: Abnormal symmetry left;Other (Comment) (very slight asymetry left) Lingual ROM: Within Functional Limits Velum: Within Functional  Limits Mandible: Within Functional Limits Motor Speech Overall Motor Speech: Appears within functional limits for tasks assessed Phonation: Hoarse;Other (comment) (per patient this is his baseline vocal quality) Resonance: Within functional limits Articulation: Within functional limitis Intelligibility: Intelligible Motor Planning: Witnin functional limits   GO                   Sonia Baller, MA, CCC-SLP Speech Therapy

## 2021-07-07 LAB — GLUCOSE, CAPILLARY
Glucose-Capillary: 101 mg/dL — ABNORMAL HIGH (ref 70–99)
Glucose-Capillary: 109 mg/dL — ABNORMAL HIGH (ref 70–99)
Glucose-Capillary: 117 mg/dL — ABNORMAL HIGH (ref 70–99)
Glucose-Capillary: 119 mg/dL — ABNORMAL HIGH (ref 70–99)
Glucose-Capillary: 133 mg/dL — ABNORMAL HIGH (ref 70–99)
Glucose-Capillary: 150 mg/dL — ABNORMAL HIGH (ref 70–99)

## 2021-07-07 LAB — BASIC METABOLIC PANEL
Anion gap: 9 (ref 5–15)
BUN: 28 mg/dL — ABNORMAL HIGH (ref 8–23)
CO2: 29 mmol/L (ref 22–32)
Calcium: 8.1 mg/dL — ABNORMAL LOW (ref 8.9–10.3)
Chloride: 106 mmol/L (ref 98–111)
Creatinine, Ser: 1.15 mg/dL (ref 0.61–1.24)
GFR, Estimated: >60 mL/min (ref >60)
Glucose, Bld: 108 mg/dL — ABNORMAL HIGH (ref 70–99)
Potassium: 2.6 mmol/L — CL (ref 3.5–5.1)
Sodium: 144 mmol/L (ref 135–145)

## 2021-07-07 LAB — POTASSIUM: Potassium: 3.6 mmol/L (ref 3.5–5.1)

## 2021-07-07 MED ORDER — ADULT MULTIVITAMIN W/MINERALS CH
1.0000 | ORAL_TABLET | Freq: Every day | ORAL | Status: DC
  Administered 2021-07-07 – 2021-07-08 (×2): 1 via ORAL
  Filled 2021-07-07 (×2): qty 1

## 2021-07-07 MED ORDER — POTASSIUM CHLORIDE 10 MEQ/100ML IV SOLN
10.0000 meq | INTRAVENOUS | Status: AC
Start: 2021-07-07 — End: 2021-07-07
  Administered 2021-07-07 (×2): 10 meq via INTRAVENOUS
  Filled 2021-07-07: qty 100

## 2021-07-07 MED ORDER — POTASSIUM CHLORIDE 20 MEQ PO PACK
40.0000 meq | PACK | Freq: Once | ORAL | Status: AC
  Administered 2021-07-07: 40 meq via ORAL
  Filled 2021-07-07: qty 2

## 2021-07-07 NOTE — NC FL2 (Signed)
[place] LEVEL OF CARE SCREENING TOOL     IDENTIFICATION  Patient Name: Alvin Hardy Birthdate: 1935-05-10 Sex: male Admission Date (Current Location): 06/28/2021  [facility] [place] and [place] Number:  Herbalist and Address:  [facility],  [address], [place]      Provider Number: [id number]  Attending Physician Name and Address:  Shawna Clamp, MD  Relative Name and Phone Number:  Tarez, Bowns [phone]  [phone]    Current Level of Care: Hospital Recommended Level of Care: [place] Prior Approval Number:    Date Approved/Denied:   PASRR Number: [id number]  Discharge Plan: SNF    Current Diagnoses: Patient Active Problem List   Diagnosis Date Noted   Acute respiratory failure with hypoxia ([place])    Multifocal pneumonia 06/28/2021   Sepsis ([place]) 06/28/2021   History of lung cancer 06/28/2021   Essential hypertension 06/28/2021   Prolonged QT interval 06/28/2021   Osteoarthritis of left glenohumeral joint 12/22/2018   Aortic atherosclerosis ([place]) 07/10/2018   Bursitis of elbow 07/10/2018   Diarrhea 07/10/2018   DJD (degenerative joint disease) 07/10/2018   Finger pain 07/10/2018   Foot pain 07/10/2018   GERD (gastroesophageal reflux disease) 07/10/2018   Hoarseness 07/10/2018   Impaired fasting glucose 07/10/2018   Low back pain 07/10/2018   Neck mass 07/10/2018   Obesity 07/10/2018   Other psoriasis 07/10/2018   Postnasal drip 07/10/2018   Prostatic hypertrophy 07/10/2018   Rectal bleeding 07/10/2018   Sinusitis 07/10/2018   Sore throat 07/10/2018   UTI (urinary tract infection) 07/10/2018   Dyspnea 02/04/2014   Hyperlipidemia 01/25/2014   Chest pain 01/25/2014   Claudication ([place]) 01/25/2014   BPH (benign prostatic hyperplasia)    Hypogonadism male    Bipolar 1 disorder ([place])    ED (erectile dysfunction)    Neuromuscular disorder (HCC)    Laryngopharyngeal reflux     Gilbert's syndrome    Morton's neuroma    Lung cancer, Right Upper Lobe     Orientation RESPIRATION BLADDER Height & Weight     Self, Time, Place, Situation  Normal Continent Weight: 191 lb 9.3 oz (86.9 kg) Height:  5\' 10"  (177.8 cm)  BEHAVIORAL SYMPTOMS/MOOD NEUROLOGICAL BOWEL NUTRITION STATUS      Incontinent Diet (Dysphagia 3 diet)  AMBULATORY STATUS COMMUNICATION OF NEEDS Skin   Limited Assist Verbally Normal                       Personal Care Assistance Level of Assistance  Bathing, Feeding, Dressing Bathing Assistance: Limited assistance Feeding assistance: Independent Dressing Assistance: Limited assistance     Functional Limitations Info  Sight, Speech, Hearing Sight Info: Adequate Hearing Info: Adequate Speech Info: Adequate    SPECIAL CARE FACTORS FREQUENCY  PT (By licensed PT), OT (By licensed OT)     PT Frequency: Minimum 5x a week OT Frequency: Minimum 5x a week            Contractures Contractures Info: Not present    Additional Factors Info  Code Status, Allergies Code Status Info: Full Code Allergies Info: Codeine   Hydrocodone-acetaminophen   Omeprazole Magnesium   Tramadol   Decadron (Dexamethasone)   Fenofibrate   Neomycin-bacitracin Zn-polymyx   Other   Penicillin G   Propoxyphene   Statins   Ubidecarenone   Zetia (Ezetimibe)   Zoloft (Sertraline Hcl)           Current Medications (07/07/2021):  This  is the current hospital active medication list Current Facility-Administered Medications  Medication Dose Route Frequency Provider Last Rate Last Admin   0.9 %  sodium chloride infusion   Intravenous PRN Gleason, Otilio Carpen, PA-C   Stopped at 07/01/21 1801   acetaminophen (TYLENOL) tablet 650 mg  650 mg Oral Q6H PRN Margaretha Seeds, MD   650 mg at 07/06/21 2031   Or   acetaminophen (TYLENOL) suppository 650 mg  650 mg Rectal Q6H PRN Margaretha Seeds, MD       albuterol (PROVENTIL) (2.5 MG/3ML) 0.083% nebulizer solution 2.5 mg  2.5 mg  Nebulization Q2H PRN Arrien, Jimmy Picket, MD   2.5 mg at 07/06/21 1761   aspirin chewable tablet 81 mg  81 mg Oral Daily Margaretha Seeds, MD   81 mg at 07/07/21 1125   Chlorhexidine Gluconate Cloth 2 % PADS 6 each  6 each Topical Daily Dessa Phi, DO   6 each at 07/07/21 1002   clonazePAM (KLONOPIN) tablet 0.5 mg  0.5 mg Oral QHS Margaretha Seeds, MD   0.5 mg at 07/06/21 2031   clopidogrel (PLAVIX) tablet 75 mg  75 mg Oral Daily Derek Jack, MD   75 mg at 07/07/21 1125   enoxaparin (LOVENOX) injection 40 mg  40 mg Subcutaneous Daily Chotiner, Yevonne Aline, MD   40 mg at 07/07/21 1127   gabapentin (NEURONTIN) capsule 600 mg  600 mg Oral Daily Margaretha Seeds, MD   600 mg at 07/07/21 1124   And   gabapentin (NEURONTIN) capsule 900 mg  900 mg Oral QHS Margaretha Seeds, MD   900 mg at 07/06/21 2030   lithium carbonate capsule 300 mg  300 mg Oral Q12H Margaretha Seeds, MD   300 mg at 07/07/21 1125   losartan (COZAAR) tablet 50 mg  50 mg Oral Daily Margaretha Seeds, MD   50 mg at 07/07/21 1125   MEDLINE mouth rinse  15 mL Mouth Rinse BID Margaretha Seeds, MD   15 mL at 07/06/21 2034   menthol-cetylpyridinium (CEPACOL) lozenge 3 mg  1 lozenge Oral BID PRN Shawna Clamp, MD   3 mg at 07/02/21 1554   mometasone-formoterol (DULERA) 200-5 MCG/ACT inhaler 2 puff  2 puff Inhalation BID Tawni Millers, MD   2 puff at 07/07/21 0808   multivitamin with minerals tablet 1 tablet  1 tablet Oral Daily Shawna Clamp, MD       ondansetron [facility]) injection 4 mg  4 mg Intravenous Q6H PRN Arrien, Jimmy Picket, MD   4 mg at 07/04/21 1617   pantoprazole (PROTONIX) EC tablet 40 mg  40 mg Oral Daily Shawna Clamp, MD   40 mg at 07/07/21 6073   polyvinyl alcohol (LIQUIFILM TEARS) 1.4 % ophthalmic solution 1 drop  1 drop Both Eyes PRN Shawna Clamp, MD   1 drop at 07/05/21 2208   predniSONE (DELTASONE) tablet 40 mg  40 mg Oral Q breakfast Shawna Clamp, MD   40 mg at 07/07/21 7106    propranolol (INDERAL) tablet 10 mg  10 mg Oral Daily Margaretha Seeds, MD   10 mg at 07/07/21 1125   senna-docusate (Senokot-S) tablet 1 tablet  1 tablet Oral QHS PRN Margaretha Seeds, MD   1 tablet at 07/05/21 1113     Discharge Medications: Please see discharge summary for a list of discharge medications.  Relevant Imaging Results:  Relevant Lab Results:   Additional Information SSN [id number]  Evette Cristal  R, LCSW

## 2021-07-07 NOTE — Progress Notes (Signed)
Nutrition Follow-up  INTERVENTION:   -Multivitamin with minerals daily  NUTRITION DIAGNOSIS:   Increased nutrient needs related to acute illness as evidenced by estimated needs.  Ongoing.  GOAL:   Patient will meet greater than or equal to 90% of their needs  Progressing.  MONITOR:   PO intake, Labs, Weight trends, I & O's     ASSESSMENT:   86 year old man who presented to [facility] ED 8/28 for SOB and fever. Found to have multifocal PNA. PMHx significant for lung CA (in remission, s/p lobectomy), HTN, HLD, GERD, Gilbert's syndrome, anxiety, bipolar disorder.     Initially seen by PCP a few days prior to admission, placed on doxycycline without improvement; he was subsequently placed on azithromycin and prednisone and Decadron x 1 and albuterol were administered in the office. Despite these interventions, SOB and dyspnea persisted and patient presented to ED.  8/28: admitted 8/29: intubated with OGT 8/31: extubated  Patient now on dysphagia 3 diet  as of 9/5. Consuming 75-100% of meals. No supplemental needs identified.  Admission weight: 191 lbs.  Medications: KLOR-CON, IV KCl  Labs reviewed:  CBGs: 101-217 Low K  Diet Order:   Diet Order             DIET DYS 3 Room service appropriate? Yes; Fluid consistency: Thin  Diet effective now                   EDUCATION NEEDS:   No education needs have been identified at this time  Skin:  Skin Assessment: Reviewed RN Assessment  Last BM:  9/5 - type 7  Height:   Ht Readings from Last 1 Encounters:  06/29/21 5\' 10"  (1.778 m)    Weight:   Wt Readings from Last 1 Encounters:  06/30/21 86.9 kg   BMI:  Body mass index is 27.49 kg/m.  Estimated Nutritional Needs:   Kcal:  2000-2200  Protein:  100-115g  Fluid:  2L/day   Clayton Bibles, MS, RD, LDN Inpatient Clinical Dietitian Contact information available via Amion

## 2021-07-07 NOTE — Progress Notes (Signed)
Physical Therapy Treatment Patient Details Name: Alvin Hardy MRN: [id number] DOB: [birth year] Today's Date: 07/07/2021    History of Present Illness Patient is a 86 years old male presented in the ED on 06/28/21 with fever and shortness of breath andadmitted for acute hypoxic respiratory failure due to multifocal pneumonia requiring mechanical intubation/Severe sepsis.  Pt was also noted to have acute expressive aphasia, encephalopathy and confusion, however had further work-up with brain MRI and EEG ruled out seizures and acute stroke.  PMH significant for depression, essential hypertension, GERD, hyperlipidemia, hypogonadism, psoriatic arthritis, BPH, lung CA (in remission, s/p lobectomy), VATS    PT Comments    Pt assisted with ambulating short distance in hallway and requiring mod assist for mobility at this time.  Continue to recommend SNF upon d/c.    Follow Up Recommendations  SNF     Equipment Recommendations  Wheelchair cushion (measurements PT);Wheelchair (measurements PT);Hospital bed    Recommendations for Other Services       Precautions / Restrictions Precautions Precautions: Fall    Mobility  Bed Mobility               General bed mobility comments: pt in recliner    Transfers Overall transfer level: Needs assistance Equipment used: Rolling walker (2 wheeled) Transfers: Sit to/from Stand Sit to Stand: Mod assist;+2 physical assistance         General transfer comment: verbal cues for hand placement, pt required assist to rise and steady, presents with initial posterior lean upon standing requiring cues and time to correct; pt required cues to use UEs through RW for more support, performed march in place first and then progressed into ambulating  Ambulation/Gait Ambulation/Gait assistance: Min assist;Mod assist Gait Distance (Feet): 20 Feet (x2) Assistive device: Rolling walker (2 wheeled) Gait Pattern/deviations: Step-through pattern;Decreased  stride length;Narrow base of support;Trunk flexed Gait velocity: decr   General Gait Details: very small narrow steps, pt required cues for RW positioning and posture, required one seated rest break due to fatigue however denies any other symptoms   Stairs             Wheelchair Mobility    Modified Rankin (Stroke Patients Only)       Balance           Standing balance support: Bilateral upper extremity supported;During functional activity Standing balance-Leahy Scale: Poor Standing balance comment: reliant on UE support and external assist                            Cognition Arousal/Alertness: Awake/alert Behavior During Therapy: WFL for tasks assessed/performed Overall Cognitive Status: Within Functional Limits for tasks assessed                                        Exercises      General Comments        Pertinent Vitals/Pain Pain Assessment: No/denies pain    Home Living                      Prior Function            PT Goals (current goals can now be found in the care plan section) Progress towards PT goals: Progressing toward goals    Frequency    Min 2X/week      PT Plan Current plan remains  appropriate    Co-evaluation              AM-PAC PT "6 Clicks" Mobility   Outcome Measure  Help needed turning from your back to your side while in a flat bed without using bedrails?: A Lot Help needed moving from lying on your back to sitting on the side of a flat bed without using bedrails?: A Lot Help needed moving to and from a bed to a chair (including a wheelchair)?: A Lot Help needed standing up from a chair using your arms (Hardy.g., wheelchair or bedside chair)?: A Lot Help needed to walk in hospital room?: A Lot Help needed climbing 3-5 steps with a railing? : Total 6 Click Score: 11    End of Session Equipment Utilized During Treatment: Gait belt Activity Tolerance: Patient limited by  fatigue Patient left: in chair;with call bell/phone within reach;with chair alarm set Nurse Communication: Mobility status PT Visit Diagnosis: Muscle weakness (generalized) (M62.81);Difficulty in walking, not elsewhere classified (R26.2)     Time: 6837-2902 PT Time Calculation (min) (ACUTE ONLY): 21 min  Charges:  $Gait Training: 8-22 mins                    Arlyce Dice, DPT Acute Rehabilitation Services Pager: [phone] Office: [phone]   Alvin Hardy 07/07/2021, 3:19 PM

## 2021-07-07 NOTE — Progress Notes (Signed)
Occupational Therapy Treatment Patient Details Name: Alvin Hardy MRN: [id number] DOB: Jul 05, 1935 Today's Date: 07/07/2021    History of present illness Patient is a 86 years old male presented in the ED on 06/28/21 with fever and shortness of breath andadmitted for acute hypoxic respiratory failure due to multifocal pneumonia requiring mechanical intubation/Severe sepsis.  Pt was also noted to have acute expressive aphasia, encephalopathy and confusion, however had further work-up with brain MRI and EEG ruled out seizures and acute stroke.  PMH significant for depression, essential hypertension, GERD, hyperlipidemia, hypogonadism, psoriatic arthritis, BPH, lung CA (in remission, s/p lobectomy), VATS   OT comments  Patient was noted to have increased alertness and motivation during session. Patient was set up for UB bathing and grooming tasks at bed level. Patient was mod a x 2 for transfer to recliner with rolling walker with strong posterior leaning noted. Patient's discharge plan remains appropriate at this time. OT will continue to follow acutely.    Follow Up Recommendations  SNF    Equipment Recommendations  3 in 1 bedside commode    Recommendations for Other Services      Precautions / Restrictions Precautions Precautions: Fall Precaution Comments: monitor O2 and HR Restrictions Weight Bearing Restrictions: No       Mobility Bed Mobility Overal bed mobility: Needs Assistance Bed Mobility: Rolling;Supine to Sit Rolling: Mod assist   Supine to sit: Max assist     General bed mobility comments: with education for proper hand and foot placement and physical assist for transitioning and inital sitting balace with posterior leaning noted.    Transfers Overall transfer level: Needs assistance Equipment used: Rolling walker (2 wheeled) Transfers: Stand Pivot Transfers;Sit to/from Stand Sit to Stand: Mod assist;+2 safety/equipment;+2 physical assistance Stand pivot  transfers: +2 safety/equipment;+2 physical assistance;Mod assist       General transfer comment: patient required seuqencing cues for movement. patient was noted to have posterior leaning during movement with increase in posterior leaning closer to chair patient was during transition. patient reported feeling anxious. nurse was present during transfer.    Balance Overall balance assessment: Needs assistance Sitting-balance support: Bilateral upper extremity supported;Feet supported Sitting balance-Leahy Scale: Poor     Standing balance support: Bilateral upper extremity supported;During functional activity Standing balance-Leahy Scale: Poor                             ADL either performed or assessed with clinical judgement   ADL Overall ADL's : Needs assistance/impaired     Grooming: Wash/dry face;Wash/dry hands;Set up;Bed level   Upper Body Bathing: Set up;Bed level   Lower Body Bathing: Maximal assistance;Bed level   Upper Body Dressing : Minimal assistance;Bed level       Toilet Transfer: Moderate assistance;+2 for physical assistance;+2 for safety/equipment;RW Toilet Transfer Details (indicate cue type and reason): patient was able to transfer from edge of bed to recliner with mod A x2 with cues for sequencing of rolling walker and feet movement.           General ADL Comments: patient was noted to need mod A for initial sitting balance on edge of bed with posterior leaning. patient was able to correct with education.     Vision       Perception     Praxis      Cognition Arousal/Alertness: Awake/alert Behavior During Therapy: WFL for tasks assessed/performed Overall Cognitive Status: Within Functional Limits for tasks assessed  General Comments: patient was noted to have some confusion on current location this AM        Exercises     Shoulder Instructions       General Comments patients HR  was noted to increase to 120 bpm with transfer and O2 maintained above 94% on RA.    Pertinent Vitals/ Pain       Pain Assessment: No/denies pain  Home Living                                          Prior Functioning/Environment              Frequency  Min 2X/week        Progress Toward Goals  OT Goals(current goals can now be found in the care plan section)  Progress towards OT goals: Progressing toward goals  Acute Rehab OT Goals Patient Stated Goal: to get back to [place] Discharge plan remains appropriate    Co-evaluation                 AM-PAC OT "6 Clicks" Daily Activity     Outcome Measure   Help from another person eating meals?: A Little Help from another person taking care of personal grooming?: A Little Help from another person toileting, which includes using toliet, bedpan, or urinal?: Total Help from another person bathing (including washing, rinsing, drying)?: A Lot Help from another person to put on and taking off regular upper body clothing?: A Little Help from another person to put on and taking off regular lower body clothing?: Total 6 Click Score: 13    End of Session Equipment Utilized During Treatment: Rolling walker;Gait belt  OT Visit Diagnosis: Unsteadiness on feet (R26.81);Muscle weakness (generalized) (M62.81)   Activity Tolerance Patient tolerated treatment well   Patient Left in chair;with call bell/phone within reach   Nurse Communication Other (comment) (nurse present during session)        Time: 5009-3818 OT Time Calculation (min): 23 min  Charges: OT General Charges $OT Visit: 1 Visit OT Treatments $Self Care/Home Management : 23-37 mins  Jackelyn Poling OTR/L, MS Acute Rehabilitation Department Office# [phone] Pager# [phone]    [place] 07/07/2021, 8:51 AM

## 2021-07-07 NOTE — TOC Progression Note (Signed)
Transition of Care [facility]) - Progression Note    Patient Details  Name: Alvin Hardy MRN: [id number] Date of Birth: 1935-05-06  Transition of Care [facility]) CM/SW Contact  Ross Ludwig, [place] Phone Number: 07/07/2021, 5:22 PM  Clinical Narrative:    CSW attempted to contact [employer] admissions worker for SNF.  CSW left a message awaiting for a call back to see when they can accept patient.  CSW to follow up tomorrow.   Expected Discharge Plan: Home/Self Care Barriers to Discharge: Continued Medical Work up  Expected Discharge Plan and Services Expected Discharge Plan: Home/Self Care   Discharge Planning Services: CM Consult   Living arrangements for the past 2 months: Apartment                                       Social Determinants of Health (SDOH) Interventions    Readmission Risk Interventions No flowsheet data found.

## 2021-07-07 NOTE — Progress Notes (Signed)
PROGRESS NOTE    Alvin Hardy  HDQ:[id number] DOB: 07/22/1935 DOA: 06/28/2021 PCP: Crist Infante, MD    Brief Narrative:  This 86 years old male with PMH significant for depression, essential hypertension, GERD, hyperlipidemia, hypogonadism, psoriatic arthritis, BPH, lung CA (in remission, s/p lobectomy )presented in the ED with fever and shortness of breath.  He is found to have multifocal pneumonia.  He was seen by PCP and started on doxycycline without any improvement.  He was subsequently placed on Zithromax, prednisone and albuterol despite this intervention. Patient continued to have worsening shortness of breath. Patient was admitted for acute hypoxic respiratory failure secondary to multifocal pneumonia requiring intubation.  Patient was admitted in ICU and subsequently extubated. He has completed antibiotics course. He was noted to have acute expressive aphasia,  encephalopathy and confusion.  further work-up with brain MRI and EEG ruled out seizures and acute stroke. Neurology signed off.  Patient is awaiting SNF placement  Assessment & Plan:   Principal Problem:   Multifocal pneumonia Active Problems:   Dyspnea   Sepsis ([place])   History of lung cancer   Essential hypertension   Prolonged QT interval   Acute respiratory failure with hypoxia ([place])  Acute hypoxic respiratory failure due to multifocal pneumonia requiring mechanical intubation. Severe sepsis secondary to above.  (Present on admission) Patient presented with worsening shortness of breath, intubated in the ED. Patient admitted in ICU,  continued on antibiotics, subsequently extubated. CTA chest ruled out PE ,  but showed Multi focal pneumonia Patient is now on 2 L/ m [place], sat's 94%. LVEF 45 to 11% grade 2 diastolic dysfunction on echo. Blood cultures no growth.  Completed antibiotics  x 7 days Respiratory panel negative.  Strep pneumo and Legionella negative. Patient found to have wheezing in setting of her  tobacco abuse, possible COPD exacerbation Continued bronchodilators and solumedrol, transitioned to prednisone. Sepsis physiology resolved.  He is weaned down to room air sats 100%. Encourage out of bed to the chair.  Acute encephalopathy could be secondary to hypoxia.: Continued to have intermittent confusion could be due to hypoxia > now resolved. Patient has developed facial asymmetry, MRI ruled out acute stroke EEG negative for seizures.  Neuro exam nonfocal. Patient is back to her baseline mental status.  Neurology signed off.  History of lung cancer s/p right upper lobe lobectomy and radiation: Continue outpatient oncology follow-up.  Hypertension: Continue losartan, propranolol. Continue hydralazine as needed.  Hyperlipidemia: Continue statins.  Bipolar disorder: Continue lithium.  Anxiety disorder: Continue Klonopin.  Acute urinary retention: Patient has developed acute urinary retention requiring Caude Foley catheter. Renal ultrasound medical renal disease.  Continue Flomax Patient failed voiding trial twice,  urology consulted,  DVT prophylaxis: Lovenox Code Status: Full code Family Communication: No family at bedside Disposition Plan:   Status is: Inpatient  Remains inpatient appropriate because:Inpatient level of care appropriate due to severity of illness  Dispo: The patient is from: Home              Anticipated d/c is to: SNF              Patient currently is not medically stable to d/c.   Difficult to place patient No   Consultants:  Neurology PCCM  Procedures: MRI EEG Antimicrobials:   Anti-infectives (From admission, onward)    Start     Dose/Rate Route Frequency Ordered Stop   07/03/21 1600  cefTRIAXone (ROCEPHIN) 1 g in sodium chloride 0.9 % 100 mL IVPB  1 g 200 mL/hr over 30 Minutes Intravenous Every 24 hours 07/03/21 1422 07/04/21 1649   07/03/21 1515  azithromycin (ZITHROMAX) tablet 500 mg  Status:  Discontinued        500 mg  Oral Daily 07/03/21 1421 07/04/21 1407   07/01/21 1800  ceFEPIme (MAXIPIME) 2 g in sodium chloride 0.9 % 100 mL IVPB  Status:  Discontinued        2 g 200 mL/hr over 30 Minutes Intravenous Every 12 hours 07/01/21 0750 07/03/21 1417   06/28/21 1800  vancomycin (VANCOREADY) IVPB 1750 mg/350 mL  Status:  Discontinued        1,750 mg 175 mL/hr over 120 Minutes Intravenous Every 24 hours 06/28/21 0727 06/28/21 1356   06/28/21 0700  ceFEPIme (MAXIPIME) 1 g in sodium chloride 0.9 % 100 mL IVPB  Status:  Discontinued        1 g 200 mL/hr over 30 Minutes Intravenous Every 8 hours 06/28/21 0619 06/28/21 0644   06/28/21 0700  ceFEPIme (MAXIPIME) 2 g in sodium chloride 0.9 % 100 mL IVPB  Status:  Discontinued        2 g 200 mL/hr over 30 Minutes Intravenous Every 8 hours 06/28/21 0645 07/01/21 0750   06/28/21 0200  ceFEPIme (MAXIPIME) 2 g in sodium chloride 0.9 % 100 mL IVPB        2 g 200 mL/hr over 30 Minutes Intravenous  Once 06/28/21 0151 06/28/21 0250   06/28/21 0200  vancomycin (VANCOCIN) IVPB 1000 mg/200 mL premix        1,000 mg 200 mL/hr over 60 Minutes Intravenous  Once 06/28/21 0151 06/28/21 0345        Subjective: Patient was seen and examined at bedside. Overnight events noted.   Patient was sitting comfortably on the chair , reports feeling better. His abdomen seems distended but denies any pain,  able to pass flatus.  Objective: Vitals:   07/06/21 2040 07/07/21 0423 07/07/21 0808 07/07/21 1303  BP: 131/69 (!) 153/85  121/72  Pulse: (!) 104 100  88  Resp: 20 20  (!) 24  Temp: 98 F (36.7 C) 98 F (36.7 C)  98.2 F (36.8 C)  TempSrc:  Axillary    SpO2: 93% 97% 92% 94%  Weight:      Height:        Intake/Output Summary (Last 24 hours) at 07/07/2021 1527 Last data filed at 07/07/2021 5176 Gross per 24 hour  Intake 480 ml  Output 600 ml  Net -120 ml   Filed Weights   06/30/21 0800  Weight: 86.9 kg    Examination:  General exam: Appears comfortable, not in any acute  distress. Respiratory system: Clear to auscultation bilaterally, no wheezing. Cardiovascular system: S1-S2 heard, regular rate and rhythm, no murmur.   Gastrointestinal system: Abdomen is distended, soft and nontender.  Normal bowel sounds heard. Central nervous system: Alert and oriented x 3, no focal neurological deficits. Extremities: No edema, no cyanosis, no clubbing. Skin: No rashes, lesions or ulcers Psychiatry: Mood and affect appropriate.    Data Reviewed: I have personally reviewed following labs and imaging studies  CBC: Recent Labs  Lab 07/02/21 0306 07/03/21 0254 07/04/21 0358 07/05/21 0812 07/06/21 0321  WBC 13.6* 17.4* 17.9* 18.7* 13.7*  NEUTROABS  --   --  14.9*  --   --   HGB 13.4 14.6 15.7 16.9 15.8  HCT 42.6 46.4 49.2 53.8* 50.9  MCV 87.8 87.9 87.9 88.5 88.8  PLT 250 315 355  302 660   Basic Metabolic Panel: Recent Labs  Lab 07/01/21 0245 07/02/21 0306 07/03/21 0254 07/04/21 0358 07/05/21 0901 07/06/21 0321 07/07/21 0602  NA 141   < > 144 146* 146* 146* 144  K 4.2   < > 3.7 3.1* 3.2* 3.1* 2.6*  CL 109   < > 110 112* 113* 111 106  CO2 22   < > 24 25 28 27 29   GLUCOSE 121*   < > 141* 147* 157* 113* 108*  BUN 38*   < > 38* 35* 28* 26* 28*  CREATININE 1.15   < > 1.18 1.11 1.14 1.05 1.15  CALCIUM 8.5*   < > 8.2* 8.4* 8.7* 8.5* 8.1*  MG 2.4  --   --   --   --  2.4  --   PHOS 3.4  --   --   --   --  3.2  --    < > = values in this interval not displayed.   GFR: Estimated Creatinine Clearance: 49.4 mL/min (by C-G formula based on SCr of 1.15 mg/dL). Liver Function Tests: No results for input(s): AST, ALT, ALKPHOS, BILITOT, PROT, ALBUMIN in the last 168 hours.  No results for input(s): LIPASE, AMYLASE in the last 168 hours. No results for input(s): AMMONIA in the last 168 hours. Coagulation Profile: No results for input(s): INR, PROTIME in the last 168 hours.  Cardiac Enzymes: No results for input(s): CKTOTAL, CKMB, CKMBINDEX, TROPONINI in the  last 168 hours. BNP (last 3 results) No results for input(s): PROBNP in the last 8760 hours. HbA1C: No results for input(s): HGBA1C in the last 72 hours.  CBG: Recent Labs  Lab 07/06/21 2029 07/07/21 0049 07/07/21 0421 07/07/21 0743 07/07/21 1104  GLUCAP 217* 101* 109* 119* 117*   Lipid Profile: No results for input(s): CHOL, HDL, LDLCALC, TRIG, CHOLHDL, LDLDIRECT in the last 72 hours.  Thyroid Function Tests: No results for input(s): TSH, T4TOTAL, FREET4, T3FREE, THYROIDAB in the last 72 hours. Anemia Panel: No results for input(s): VITAMINB12, FOLATE, FERRITIN, TIBC, IRON, RETICCTPCT in the last 72 hours. Sepsis Labs: Recent Labs  Lab 07/04/21 1538  PROCALCITON <0.10    Recent Results (from the past 240 hour(s))  Resp Panel by RT-PCR (Flu A&B, Covid) Nasopharyngeal Swab     Status: None   Collection Time: 06/28/21  1:00 AM   Specimen: Nasopharyngeal Swab; Nasopharyngeal(NP) swabs in vial transport medium  Result Value Ref Range Status   SARS Coronavirus 2 by RT PCR NEGATIVE NEGATIVE Final    Comment: (NOTE) SARS-CoV-2 target nucleic acids are NOT DETECTED.  The SARS-CoV-2 RNA is generally detectable in upper respiratory specimens during the acute phase of infection. The lowest concentration of SARS-CoV-2 viral copies this assay can detect is 138 copies/mL. A negative result does not preclude SARS-Cov-2 infection and should not be used as the sole basis for treatment or other patient management decisions. A negative result may occur with  improper specimen collection/handling, submission of specimen other than nasopharyngeal swab, presence of viral mutation(s) within the areas targeted by this assay, and inadequate number of viral copies(<138 copies/mL). A negative result must be combined with clinical observations, patient history, and epidemiological information. The expected result is Negative.  Fact Sheet for Patients:   EntrepreneurPulse.com.au  Fact Sheet for Healthcare Providers:  IncredibleEmployment.be  This test is no t yet approved or cleared by the [place] FDA and  has been authorized for detection and/or diagnosis of SARS-CoV-2 by FDA under an Emergency  Use Authorization (EUA). This EUA will remain  in effect (meaning this test can be used) for the duration of the COVID-19 declaration under Section 564(b)(1) of the Act, 21 U.S.C.section 360bbb-3(b)(1), unless the authorization is terminated  or revoked sooner.       Influenza A by PCR NEGATIVE NEGATIVE Final   Influenza B by PCR NEGATIVE NEGATIVE Final    Comment: (NOTE) The Xpert Xpress SARS-CoV-2/FLU/RSV plus assay is intended as an aid in the diagnosis of influenza from Nasopharyngeal swab specimens and should not be used as a sole basis for treatment. Nasal washings and aspirates are unacceptable for Xpert Xpress SARS-CoV-2/FLU/RSV testing.  Fact Sheet for Patients: EntrepreneurPulse.com.au  Fact Sheet for Healthcare Providers: IncredibleEmployment.be  This test is not yet approved or cleared by the [place] FDA and has been authorized for detection and/or diagnosis of SARS-CoV-2 by FDA under an Emergency Use Authorization (EUA). This EUA will remain in effect (meaning this test can be used) for the duration of the COVID-19 declaration under Section 564(b)(1) of the Act, 21 U.S.C. section 360bbb-3(b)(1), unless the authorization is terminated or revoked.  Performed at [facility], [place] [address], [place], [place] [address]   Blood Culture (routine x 2)     Status: None   Collection Time: 06/28/21  1:00 AM   Specimen: BLOOD  Result Value Ref Range Status   Specimen Description   Final    BLOOD BLOOD LEFT ARM Performed at [place] [address], [place], [place] [address]    Special Requests    Final    BOTTLES DRAWN AEROBIC AND ANAEROBIC Blood Culture adequate volume Performed at [facility] [address], [place], [place] [address]    Culture   Final    NO GROWTH 5 DAYS Performed at [facility], [place] [address] [place], [place] [address]    Report Status 07/03/2021 FINAL  Final  Blood Culture (routine x 2)     Status: None   Collection Time: 06/28/21  1:00 AM   Specimen: BLOOD  Result Value Ref Range Status   Specimen Description   Final    BLOOD BLOOD RIGHT ARM Performed at [facility] [address], [place], [place] [address]    Special Requests   Final    BOTTLES DRAWN AEROBIC AND ANAEROBIC Blood Culture adequate volume Performed at [facility] [address] [place], [place] [address]    Culture   Final    NO GROWTH 5 DAYS Performed at [facility], [place] [address] [place], [place] [address]    Report Status 07/03/2021 FINAL  Final  Urine Culture     Status: Abnormal   Collection Time: 06/28/21  4:50 AM   Specimen: In/Out Cath Urine  Result Value Ref Range Status   Specimen Description   Final    IN/OUT CATH URINE Performed at [place] [address], [place], [place] [address]    Special Requests   Final    NONE Performed at [facility], [place] [address], [place],  [address]    Culture (A)  Final    <10,000 COLONIES/mL INSIGNIFICANT GROWTH Performed at [place] [address] [place],  [address]    Report Status 06/29/2021 FINAL  Final  MRSA Next Gen by PCR, Nasal     Status: None   Collection Time: 06/28/21  8:23 AM   Specimen: Nasal  Mucosa; Nasal Swab  Result Value Ref Range Status   MRSA by PCR Next Gen NOT DETECTED NOT DETECTED Final    Comment: (NOTE) The GeneXpert MRSA Assay (FDA approved for NASAL specimens only), is one component of a comprehensive MRSA colonization surveillance program. It is not  intended to diagnose MRSA infection nor to guide or monitor treatment for MRSA infections. Test performance is not FDA approved in patients less than 25 years old. Performed at [facility], [place] [address] [place], [place] [address]   Respiratory (~20 pathogens) panel by PCR     Status: None   Collection Time: 06/29/21  1:24 PM   Specimen: Nasopharyngeal Swab; Respiratory  Result Value Ref Range Status   Adenovirus NOT DETECTED NOT DETECTED Final   Coronavirus 229E NOT DETECTED NOT DETECTED Final    Comment: (NOTE) The Coronavirus on the Respiratory Panel, DOES NOT test for the novel  Coronavirus (2019 nCoV)    Coronavirus HKU1 NOT DETECTED NOT DETECTED Final   Coronavirus NL63 NOT DETECTED NOT DETECTED Final   Coronavirus OC43 NOT DETECTED NOT DETECTED Final   Metapneumovirus NOT DETECTED NOT DETECTED Final   Rhinovirus / Enterovirus NOT DETECTED NOT DETECTED Final   Influenza A NOT DETECTED NOT DETECTED Final   Influenza B NOT DETECTED NOT DETECTED Final   Parainfluenza Virus 1 NOT DETECTED NOT DETECTED Final   Parainfluenza Virus 2 NOT DETECTED NOT DETECTED Final   Parainfluenza Virus 3 NOT DETECTED NOT DETECTED Final   Parainfluenza Virus 4 NOT DETECTED NOT DETECTED Final   Respiratory Syncytial Virus NOT DETECTED NOT DETECTED Final   Bordetella pertussis NOT DETECTED NOT DETECTED Final   Bordetella Parapertussis NOT DETECTED NOT DETECTED Final   Chlamydophila pneumoniae NOT DETECTED NOT DETECTED Final   Mycoplasma pneumoniae NOT DETECTED NOT DETECTED Final    Comment: Performed at [facility], [place] [address] [place], [place] [address]  Culture, Respiratory w Gram Stain     Status: None   Collection Time: 06/30/21  4:15 PM   Specimen: Tracheal Aspirate; Respiratory  Result Value Ref Range Status   Specimen Description   Final    TRACHEAL ASPIRATE Performed at [place] [facility] [address], [place], [place] [address]    Special  Requests   Final    NONE Performed at [facility], [place] [address] [place], [place] [address]    Gram Stain   Final    FEW SQUAMOUS EPITHELIAL CELLS PRESENT FEW WBC SEEN FEW GRAM POSITIVE COCCI FEW GRAM NEGATIVE RODS FEW GRAM POSITIVE RODS    Culture   Final    FEW Normal respiratory flora-no Staph aureus or Pseudomonas seen Performed at [facility], [address] [place], [place] [address]    Report Status 07/03/2021 FINAL  Final    Radiology Studies: US RENAL  Result Date: 07/06/2021 CLINICAL DATA:  Urinary retention, history of BPH EXAM: RENAL / URINARY TRACT ULTRASOUND COMPLETE COMPARISON:  None FINDINGS: Right Kidney: Renal measurements: 11.3 x 5.2 x 5.1 cm = volume: 157 mL. Cortical thinning. Increased cortical echogenicity. Cyst at lower pole 4.6 x 3.6 x 3.8 cm, simple features. No additional mass, hydronephrosis, or shadowing calcification. Left Kidney: Renal measurements: 10.8 x 4.9 x 5.5 cm = volume: 152 mL. Cortical thinning. Increased cortical echogenicity. Small cyst at upper pole 1.8 x 1.7 x 1.8 cm, simple features. No additional mass, hydronephrosis, or shadowing calcification. Bladder: Decompressed by Foley catheter, unable to evaluate Other:  N/A IMPRESSION: BILATERAL renal cysts. BILATERAL renal cortical atrophy and medical renal disease changes. No additional renal sonographic abnormalities. Electronically Signed   By: Lavonia Dana M.D.   On: 07/06/2021 17:16    Scheduled Meds:  aspirin  81 mg Oral Daily   Chlorhexidine Gluconate Cloth  6 each Topical Daily   clonazePAM  0.5 mg Oral QHS   clopidogrel  75 mg Oral Daily   enoxaparin (LOVENOX) injection  40 mg Subcutaneous Daily   gabapentin  600 mg Oral Daily   And   gabapentin  900 mg Oral QHS   lithium carbonate  300 mg Oral Q12H   losartan  50 mg Oral Daily   mouth rinse  15 mL Mouth Rinse BID   mometasone-formoterol  2 puff Inhalation BID   multivitamin with minerals  1 tablet Oral  Daily   pantoprazole  40 mg Oral Daily   predniSONE  40 mg Oral Q breakfast   propranolol  10 mg Oral Daily   Continuous Infusions:  sodium chloride Stopped (07/01/21 1801)     LOS: 9 days    Time spent: 25 mins    Kellon Chalk, MD [facility]   If 7PM-7AM, please contact night-coverage

## 2021-07-07 NOTE — Consult Note (Signed)
Urology Consult Note   Requesting Attending Physician:  Shawna Clamp, MD Service Providing Consult: Urology  Consulting Attending: Dr. Louis Meckel   Reason for Consult:  Urinary Retention  HPI: Alvin Hardy is seen in consultation for reasons noted above at the request of Shawna Clamp, MD for evaluation of urinary retention.  This is a 86 y.o. male with Hx of depression, HTN, GERD, lung CA (in remission s/p lobectomy), hypogonadism and BPH (s/p TURP 2008) who was admitted on 06/28/21 for multifocal pneumonia requiring intubation and ICU admission. He has recovered from this and is currently awaiting SNF placement. During his hospitalization he has failed his TOV x2 and currently has an indwelling Catheter in place. Urology is consulted for urinary retention.  Ucx from 8/28 w/ no significant growth.   He takes Flomax and Proscar for his history of BPH w/ well controlled LUTS prior to this admission.   Past Medical History: Past Medical History:  Diagnosis Date   Allergy    Arthritis    psoriatic arthritis, ? , treated /w mmethotrexate    Bipolar 1 disorder ([place])    BPH (benign prostatic hyperplasia)    Cataract 2010   Bilateral   Chest pain    Depression    Deviated septum    TO THE LEFT   ED (erectile dysfunction)    Essential hypertension 06/28/2021   Finger injury    mallet finger-- 02/13/2013, cast in place, followed by dr. Fredna Dow   GERD (gastroesophageal reflux disease)    Gilbert's syndrome    History of radiation therapy 07/24/13-08/27/13   50Gy/25 fx chest   Hyperlipidemia    Hypogonadism male    Laryngopharyngeal reflux    lung ca dx'd 12/2012   rul   Lung mass 02/07/13   RIGHT UPPER LOBE   Morton's neuroma    LEFT FOOT   Neuromuscular disorder ([place])    peripheral neuropathy   Shortness of breath    voice breaks, ? related to reflux, although told by Dr. Ardis Hughs, S.- early 83 yr. old    Past Surgical History:  Past Surgical History:  Procedure  Laterality Date   CYSTOSCOPY N/A 02/21/2013   Procedure: CYSTOSCOPY FLEXIBLE with insertion of foley catheter;  Surgeon: Fredricka Bonine, MD;  Location: [facility];  Service: Urology;  Laterality: N/A;   EYE SURGERY     cataracts removed fr. both eyes, IOL in place    KNEE ARTHROSCOPY Right 01/2007   PILONIDAL CYST EXCISION  1960's   [place]   TURP   VIDEO ASSISTED THORACOSCOPY (VATS)/WEDGE RESECTION Right 02/21/2013   Procedure: Right VIDEO ASSISTED THORACOSCOPY ,Thoracotomy with right upper lobectomy, node sampling ;  Surgeon: Grace Isaac, MD;  Location: [facility];  Service: Thoracic;  Laterality: Right;   VIDEO BRONCHOSCOPY N/A 02/21/2013   Procedure: VIDEO BRONCHOSCOPY;  Surgeon: Grace Isaac, MD;  Location: [facility];  Service: Thoracic;  Laterality: N/A;    Medication: Current Facility-Administered Medications  Medication Dose Route Frequency Provider Last Rate Last Admin   0.9 %  sodium chloride infusion   Intravenous PRN Gleason, Otilio Carpen, PA-C   Stopped at 07/01/21 1801   acetaminophen (TYLENOL) tablet 650 mg  650 mg Oral Q6H PRN Margaretha Seeds, MD   650 mg at 07/06/21 2031   Or   acetaminophen (TYLENOL) suppository 650 mg  650 mg Rectal Q6H PRN Margaretha Seeds, MD       albuterol (PROVENTIL) (2.5 MG/3ML) 0.083% nebulizer  solution 2.5 mg  2.5 mg Nebulization Q2H PRN Arrien, Jimmy Picket, MD   2.5 mg at 07/06/21 2831   aspirin chewable tablet 81 mg  81 mg Oral Daily Margaretha Seeds, MD   81 mg at 07/07/21 1125   Chlorhexidine Gluconate Cloth 2 % PADS 6 each  6 each Topical Daily Dessa Phi, DO   6 each at 07/07/21 1002   clonazePAM (KLONOPIN) tablet 0.5 mg  0.5 mg Oral QHS Margaretha Seeds, MD   0.5 mg at 07/06/21 2031   clopidogrel (PLAVIX) tablet 75 mg  75 mg Oral Daily Derek Jack, MD   75 mg at 07/07/21 1125   enoxaparin (LOVENOX) injection 40 mg  40 mg Subcutaneous Daily Chotiner, Yevonne Aline, MD   40 mg at 07/07/21 1127   gabapentin  (NEURONTIN) capsule 600 mg  600 mg Oral Daily Margaretha Seeds, MD   600 mg at 07/07/21 1124   And   gabapentin (NEURONTIN) capsule 900 mg  900 mg Oral QHS Margaretha Seeds, MD   900 mg at 07/06/21 2030   lithium carbonate capsule 300 mg  300 mg Oral Q12H Margaretha Seeds, MD   300 mg at 07/07/21 1125   losartan (COZAAR) tablet 50 mg  50 mg Oral Daily Margaretha Seeds, MD   50 mg at 07/07/21 1125   MEDLINE mouth rinse  15 mL Mouth Rinse BID Margaretha Seeds, MD   15 mL at 07/06/21 2034   menthol-cetylpyridinium (CEPACOL) lozenge 3 mg  1 lozenge Oral BID PRN Shawna Clamp, MD   3 mg at 07/02/21 1554   mometasone-formoterol (DULERA) 200-5 MCG/ACT inhaler 2 puff  2 puff Inhalation BID Tawni Millers, MD   2 puff at 07/07/21 0808   multivitamin with minerals tablet 1 tablet  1 tablet Oral Daily Shawna Clamp, MD       ondansetron [facility]) injection 4 mg  4 mg Intravenous Q6H PRN Arrien, Jimmy Picket, MD   4 mg at 07/04/21 1617   pantoprazole (PROTONIX) EC tablet 40 mg  40 mg Oral Daily Shawna Clamp, MD   40 mg at 07/07/21 5176   polyvinyl alcohol (LIQUIFILM TEARS) 1.4 % ophthalmic solution 1 drop  1 drop Both Eyes PRN Shawna Clamp, MD   1 drop at 07/05/21 2208   predniSONE (DELTASONE) tablet 40 mg  40 mg Oral Q breakfast Shawna Clamp, MD   40 mg at 07/07/21 0838   propranolol (INDERAL) tablet 10 mg  10 mg Oral Daily Margaretha Seeds, MD   10 mg at 07/07/21 1125   senna-docusate (Senokot-S) tablet 1 tablet  1 tablet Oral QHS PRN Margaretha Seeds, MD   1 tablet at 07/05/21 1113    Allergies: Allergies  Allergen Reactions   Codeine Anaphylaxis   Hydrocodone-Acetaminophen     Other reaction(s): Other (See Comments) Reactions unknown   Omeprazole Magnesium     Other reaction(s): Other (See Comments) Reaction Unknown   Tramadol     Other reaction(s): Other (See Comments) Reactions Unknown   Decadron [Dexamethasone]     Pt says it is contraindication due to the lithium  he is taking   Fenofibrate     Other reaction(s): Other (See Comments) Reactions Unknown   Neomycin-Bacitracin Zn-Polymyx     Other reaction(s): Other (See Comments) Reactions Unknown   Other     Pt. Remarks that all pain med. Make him nauseated    Penicillin G     Other reaction(s):  Other (See Comments) Reactions Unknown   Propoxyphene     Other reaction(s): Other (See Comments) Reaction Unknown   Statins     Lipitor, Crestor, Zocor all caused muscle aches   Ubidecarenone     Other reaction(s): Other (See Comments) Reactions Unknown   Zetia [Ezetimibe]     Muscle aches   Zoloft [Sertraline Hcl] Other (See Comments)    REACTION:  unknown    Social History: Social History   Tobacco Use   Smoking status: Former    Types: Cigarettes    Quit date: 11/01/1978    Years since quitting: 42.7   Smokeless tobacco: Never  Substance Use Topics   Alcohol use: Yes    Comment: occas.   Drug use: No    Family History Family History  Problem Relation Age of Onset   Diabetes Mother    Cancer Mother        abdomen   Heart disease Father    Diabetes Son    Cancer Maternal Aunt        lung ca, smoker    Review of Systems 10 systems were reviewed and are negative except as noted specifically in the HPI.  Objective   Vital signs in last 24 hours: BP 121/72 (BP Location: Right Arm)   Pulse 88   Temp 98.2 F (36.8 C)   Resp (!) 24   Ht 5\' 10"  (1.778 m)   Wt 86.9 kg   SpO2 94%   BMI 27.49 kg/m   Physical Exam General: NAD, A&O, resting, appropriate HEENT: [place]/AT, EOMI, MMM Pulmonary: Normal work of breathing Cardiovascular: HDS, adequate peripheral perfusion Abdomen: Soft, NTTP, nondistended. GU: Foley catheter in placed draining CYU, no CVA tenderness Extremities: warm and well perfused Neuro: Appropriate, no focal neurological deficits  Most Recent Labs: Lab Results  Component Value Date   WBC 13.7 (H) 07/06/2021   HGB 15.8 07/06/2021   HCT 50.9 07/06/2021    PLT 230 07/06/2021    Lab Results  Component Value Date   NA 144 07/07/2021   K 2.6 (LL) 07/07/2021   CL 106 07/07/2021   CO2 29 07/07/2021   BUN 28 (H) 07/07/2021   CREATININE 1.15 07/07/2021   CALCIUM 8.1 (L) 07/07/2021   MG 2.4 07/06/2021   PHOS 3.2 07/06/2021    Lab Results  Component Value Date   INR 1.1 06/28/2021   APTT 24 06/28/2021    IMAGING: US RENAL  Result Date: 07/06/2021 CLINICAL DATA:  Urinary retention, history of BPH EXAM: RENAL / URINARY TRACT ULTRASOUND COMPLETE COMPARISON:  None FINDINGS: Right Kidney: Renal measurements: 11.3 x 5.2 x 5.1 cm = volume: 157 mL. Cortical thinning. Increased cortical echogenicity. Cyst at lower pole 4.6 x 3.6 x 3.8 cm, simple features. No additional mass, hydronephrosis, or shadowing calcification. Left Kidney: Renal measurements: 10.8 x 4.9 x 5.5 cm = volume: 152 mL. Cortical thinning. Increased cortical echogenicity. Small cyst at upper pole 1.8 x 1.7 x 1.8 cm, simple features. No additional mass, hydronephrosis, or shadowing calcification. Bladder: Decompressed by Foley catheter, unable to evaluate Other: N/A IMPRESSION: BILATERAL renal cysts. BILATERAL renal cortical atrophy and medical renal disease changes. No additional renal sonographic abnormalities. Electronically Signed   By: Lavonia Dana M.D.   On: 07/06/2021 17:16    ------  Assessment:  86 y.o. male with a Hx of BPH who was admitted for multifocal PNA, now resolved, with urinary retention and failed TOVx2. Urinary retention likely related to weakness from acute illness.  Will hopefully improve with bladder rest and physical therapy.    Recommendations: - Continue Foley catheter x 7-10 days from last failed TOV - Follow up in Urology clinic as an outpatient for repeat TOV after progression of physical therapy at SNF - Continue Flomax 0.8 mg daily and Proscar 5 mg daily   Thank you for this consult. Please contact the urology consult pager with any further  questions/concerns.  Reola Mosher, MD [facility] Urology Resident, [facility]

## 2021-07-08 DIAGNOSIS — F419 Anxiety disorder, unspecified: Secondary | ICD-10-CM | POA: Insufficient documentation

## 2021-07-08 DIAGNOSIS — Z7982 Long term (current) use of aspirin: Secondary | ICD-10-CM | POA: Insufficient documentation

## 2021-07-08 DIAGNOSIS — R509 Fever, unspecified: Secondary | ICD-10-CM | POA: Insufficient documentation

## 2021-07-08 DIAGNOSIS — R339 Retention of urine, unspecified: Secondary | ICD-10-CM | POA: Insufficient documentation

## 2021-07-08 DIAGNOSIS — R059 Cough, unspecified: Secondary | ICD-10-CM | POA: Insufficient documentation

## 2021-07-08 DIAGNOSIS — Z299 Encounter for prophylactic measures, unspecified: Secondary | ICD-10-CM | POA: Insufficient documentation

## 2021-07-08 DIAGNOSIS — R52 Pain, unspecified: Secondary | ICD-10-CM | POA: Insufficient documentation

## 2021-07-08 LAB — BASIC METABOLIC PANEL
Anion gap: 6 (ref 5–15)
BUN: 23 mg/dL (ref 8–23)
CO2: 28 mmol/L (ref 22–32)
Calcium: 8.2 mg/dL — ABNORMAL LOW (ref 8.9–10.3)
Chloride: 108 mmol/L (ref 98–111)
Creatinine, Ser: 1.03 mg/dL (ref 0.61–1.24)
GFR, Estimated: >60 mL/min (ref >60)
Glucose, Bld: 114 mg/dL — ABNORMAL HIGH (ref 70–99)
Potassium: 2.9 mmol/L — ABNORMAL LOW (ref 3.5–5.1)
Sodium: 142 mmol/L (ref 135–145)

## 2021-07-08 LAB — CBC
HCT: 45.5 % (ref 39.0–52.0)
Hemoglobin: 14.4 g/dL (ref 13.0–17.0)
MCH: 28 pg (ref 26.0–34.0)
MCHC: 31.6 g/dL (ref 30.0–36.0)
MCV: 88.5 fL (ref 80.0–100.0)
Platelets: 187 10*3/uL (ref 150–400)
RBC: 5.14 MIL/uL (ref 4.22–5.81)
RDW: 14.6 % (ref 11.5–15.5)
WBC: 16.4 10*3/uL — ABNORMAL HIGH (ref 4.0–10.5)
nRBC: 0 % (ref 0.0–0.2)

## 2021-07-08 LAB — MAGNESIUM: Magnesium: 2.6 mg/dL — ABNORMAL HIGH (ref 1.7–2.4)

## 2021-07-08 LAB — GLUCOSE, CAPILLARY
Glucose-Capillary: 105 mg/dL — ABNORMAL HIGH (ref 70–99)
Glucose-Capillary: 110 mg/dL — ABNORMAL HIGH (ref 70–99)

## 2021-07-08 LAB — RESP PANEL BY RT-PCR (FLU A&B, COVID) ARPGX2
Influenza A by PCR: NEGATIVE
Influenza B by PCR: NEGATIVE
SARS Coronavirus 2 by RT PCR: NEGATIVE

## 2021-07-08 LAB — PHOSPHORUS: Phosphorus: 2.8 mg/dL (ref 2.5–4.6)

## 2021-07-08 MED ORDER — POTASSIUM CHLORIDE 20 MEQ PO PACK
40.0000 meq | PACK | Freq: Once | ORAL | Status: AC
  Administered 2021-07-08: 40 meq via ORAL
  Filled 2021-07-08: qty 2

## 2021-07-08 MED ORDER — LOSARTAN POTASSIUM 50 MG PO TABS: 50.0000 mg | ORAL_TABLET | Freq: Every day | ORAL | 2 refills | Status: DC

## 2021-07-08 MED ORDER — POTASSIUM CHLORIDE 20 MEQ PO PACK
40.0000 meq | PACK | ORAL | Status: DC
  Administered 2021-07-08: 40 meq via ORAL
  Filled 2021-07-08: qty 2

## 2021-07-08 MED ORDER — CLONAZEPAM 0.5 MG PO TABS: 0.5000 mg | ORAL_TABLET | Freq: Every day | ORAL | 0 refills | Status: AC

## 2021-07-08 MED ORDER — TESTOSTERONE CYPIONATE 200 MG/ML IM SOLN
200.0000 mg | Freq: Once | INTRAMUSCULAR | Status: AC
  Administered 2021-07-08: 200 mg via INTRAMUSCULAR
  Filled 2021-07-08: qty 1

## 2021-07-08 MED ORDER — PREDNISONE 10 MG PO TABS: ORAL_TABLET | ORAL | 0 refills | Status: DC

## 2021-07-08 MED ORDER — POTASSIUM CHLORIDE 10 MEQ/100ML IV SOLN
10.0000 meq | INTRAVENOUS | Status: AC
  Administered 2021-07-08 (×2): 10 meq via INTRAVENOUS
  Filled 2021-07-08 (×2): qty 100

## 2021-07-08 MED ORDER — MOMETASONE FURO-FORMOTEROL FUM 200-5 MCG/ACT IN AERO: 2.0000 | INHALATION_SPRAY | Freq: Two times a day (BID) | RESPIRATORY_TRACT | 2 refills | Status: DC

## 2021-07-08 NOTE — Progress Notes (Signed)
Called and gave report to Customer service manager at [facility].

## 2021-07-08 NOTE — TOC Transition Note (Signed)
Transition of Care [facility]) - CM/SW Discharge Note   Patient Details  Name: WINSLOW EDERER MRN: [id number] Date of Birth: 1935-04-16  Transition of [facility]) CM/SW Contact:  Ross Ludwig, LCSW Phone Number: 07/08/2021, 12:12 PM   Clinical Narrative:     CSW spoke to patient and [facility].  Patient can discharge today, CSW updated attending physician and bedside nurse.  Patient to be d/c'ed today to [facility].  Patient and family agreeable to plans will transport via ems RN to call report to [phone] or [phone].  Patient is aware that he will be discharging today, pending negative Covid test.     Final next level of care: Skilled Nursing Facility Barriers to Discharge: Barriers Resolved   Patient Goals and CMS Choice Patient states their goals for this hospitalization and ongoing recovery are:: To go to SNF at [facility] then return back to his [place] apartment. CMS Medicare.gov Compare Post Acute Care list provided to:: Patient Choice offered to / list presented to : Patient  Discharge Placement PASRR number recieved: 07/07/21            Patient chooses bed at: [facility] Patient to be transferred to facility by: [facility] Name of family member notified: Patient notified his family.    Discharge Plan and Services   Discharge Planning Services: CM Consult                                 Social Determinants of Health (SDOH) Interventions     Readmission Risk Interventions No flowsheet data found.

## 2021-07-08 NOTE — Discharge Summary (Signed)
Physician Discharge Summary  Alvin Hardy VOH:[id number] DOB: Mar 08, 1935 DOA: 06/28/2021  PCP: Crist Infante, MD  Admit date: 06/28/2021 Discharge date: 07/08/2021  Admitted From: Home Disposition:  SNF   Recommendations for Outpatient Follow-up:  Follow up with PCP in 1 week Follow up with [facility] Dr. Diona Fanti next week for voiding trial Repeat CBC and BMP as outpatient to follow leukocytosis once patient is off prednisone.  Potassium was replaced prior to discharge.  Discharge Condition: Stable CODE STATUS: Full code Diet recommendation:  Diet Orders (From admission, onward)     Start     Ordered   07/06/21 1100  DIET DYS 3 Room service appropriate? Yes; Fluid consistency: Thin  Diet effective now       Question Answer Comment  Room service appropriate? Yes   Fluid consistency: Thin      07/06/21 1059           Brief/Interim Summary: Alvin Hardy is an 86 years old male with PMH significant for depression, essential hypertension, GERD, hyperlipidemia, hypogonadism, psoriatic arthritis, BPH, lung CA (in remission, s/p lobectomy) who presented in the ED with fever and shortness of breath.  He was found to have multifocal pneumonia.  He was seen by PCP and started on doxycycline without any improvement.  He was subsequently placed on Zithromax, prednisone and albuterol despite this intervention. Patient continued to have worsening shortness of breath. Patient was admitted for acute hypoxic respiratory failure secondary to multifocal pneumonia requiring intubation.  Patient was admitted in ICU and subsequently extubated. He has completed antibiotics course. He was noted to have acute expressive aphasia,  encephalopathy and confusion.  Further work-up with brain MRI and EEG ruled out seizures and acute stroke. Neurology signed off.    On day of discharge, patient was feeling well, breathing sufficiently on room air without new complaints.  His encephalopathy resolved back  to his baseline.  He did also develop acute urinary retention requiring catheter placement.  He was seen by urology.  Discharge Diagnoses:  Principal Problem:   Multifocal pneumonia Active Problems:   Dyspnea   Sepsis ([place])   History of lung cancer   Essential hypertension   Prolonged QT interval   Acute respiratory failure with hypoxia (HCC)   Acute hypoxic respiratory failure due to multifocal pneumonia, possible COPD exacerbation requiring mechanical intubation. Severe sepsis secondary to above.  (Present on admission) Patient presented with worsening shortness of breath, intubated in the ED. Patient admitted in ICU,  continued on antibiotics, subsequently extubated. CTA chest ruled out PE,  but showed multi focal pneumonia LVEF 45 to 69% grade 2 diastolic dysfunction on echo. Blood cultures no growth.  Completed antibiotics  x 7 days Respiratory panel negative.  Strep pneumo and Legionella negative. Patient found to have wheezing in setting of her tobacco abuse, possible COPD exacerbation Continued bronchodilators and solumedrol, transitioned to prednisone. Sepsis physiology resolved.  He is weaned down to room air Complete prednisone taper as outpatient   Acute encephalopathy could be secondary to hypoxia.: Continued to have intermittent confusion could be due to hypoxia > now resolved. Patient has developed facial asymmetry, MRI ruled out acute stroke. Continue aspirin only.  EEG negative for seizures.  Neuro exam nonfocal. Patient is back to her baseline mental status.  Neurology signed off.   History of lung cancer s/p right upper lobe lobectomy and radiation: Continue outpatient oncology follow-up.   Hypertension: Continue losartan   Hyperlipidemia: Continue statin   Bipolar disorder: Continue lithium.  Anxiety disorder: Continue Klonopin.   Acute urinary retention: Patient has developed acute urinary retention requiring Caude Foley catheter. Renal  ultrasound medical renal disease.  Continue Flomax Patient failed voiding trial twice,  urology consulted, follow-up for outpatient voiding trial  Hypokalemia: -Replaced prior to discharge    Discharge Instructions  Discharge Instructions     Increase activity slowly   Complete by: As directed       Allergies as of 07/08/2021       Reactions   Codeine Anaphylaxis   Hydrocodone-acetaminophen    Other reaction(s): Other (See Comments) Reactions unknown   Omeprazole Magnesium    Other reaction(s): Other (See Comments) Reaction Unknown   Tramadol    Other reaction(s): Other (See Comments) Reactions Unknown   Decadron [dexamethasone]    Pt says it is contraindication due to the lithium he is taking   Fenofibrate    Other reaction(s): Other (See Comments) Reactions Unknown   Neomycin-bacitracin Zn-polymyx    Other reaction(s): Other (See Comments) Reactions Unknown   Other    Pt. Remarks that all pain med. Make him nauseated    Penicillin G    Other reaction(s): Other (See Comments) Reactions Unknown   Propoxyphene    Other reaction(s): Other (See Comments) Reaction Unknown   Statins    Lipitor, Crestor, Zocor all caused muscle aches   Ubidecarenone    Other reaction(s): Other (See Comments) Reactions Unknown   Zetia [ezetimibe]    Muscle aches   Zoloft [sertraline Hcl] Other (See Comments)   REACTION:  unknown        Medication List     STOP taking these medications    terbinafine 250 MG tablet Commonly known as: LAMISIL       TAKE these medications    albuterol 108 (90 Base) MCG/ACT inhaler Commonly known as: VENTOLIN HFA Inhale 2 puffs into the lungs every 4 (four) hours as needed for shortness of breath.   aspirin EC 81 MG tablet Take 81 mg by mouth every morning.   Calcium Carbonate-Vitamin D 600-400 MG-UNIT tablet Take 1 tablet by mouth every morning.   clobetasol cream 0.05 % Commonly known as: TEMOVATE Apply 1 application topically  daily. Reported on 01/27/2016   clonazePAM 0.5 MG tablet Commonly known as: KLONOPIN Take 1 tablet (0.5 mg total) by mouth at bedtime.   cyanocobalamin 1000 MCG tablet Take 1,000 mcg by mouth every morning.   esomeprazole 40 MG capsule Commonly known as: NEXIUM Take 40 mg by mouth daily before breakfast.   finasteride 5 MG tablet Commonly known as: PROSCAR Take 5 mg by mouth daily before breakfast.   gabapentin 300 MG capsule Commonly known as: NEURONTIN Take 1,200 mg by mouth at bedtime. He takes two capsules every morning and three capsules at bedtime.   lithium carbonate 300 MG CR tablet Commonly known as: LITHOBID Take 600 mg by mouth at bedtime.   losartan 50 MG tablet Commonly known as: COZAAR Take 1 tablet (50 mg total) by mouth daily. Start taking on: July 09, 2021   mometasone-formoterol 200-5 MCG/ACT Aero Commonly known as: DULERA Inhale 2 puffs into the lungs 2 (two) times daily.   multivitamin with minerals Tabs tablet Take 1 tablet by mouth every morning.   OVER THE COUNTER MEDICATION Take 1 tablet by mouth 2 (two) times daily. Glucosamine Chondroitin 1500mg /1200mg    predniSONE 10 MG tablet Commonly known as: DELTASONE Take 4 tabs for 3 days, then 3 tabs for 3 days, then 2 tabs for  3 days, then 1 tab for 3 days, then 1/2 tab for 4 days.   PROBIOTIC DAILY PO Take 1 each by mouth daily.   propranolol 10 MG tablet Commonly known as: INDERAL Take 10-20 mg by mouth daily as needed (For extremely stressful circumstances.).   Repatha SureClick 096 MG/ML Soaj Generic drug: Evolocumab Inject 140 mg as directed every 14 (fourteen) days.   tamsulosin 0.4 MG Caps capsule Commonly known as: FLOMAX Take 0.4 mg by mouth daily.   testosterone cypionate 200 MG/ML injection Commonly known as: DEPOTESTOSTERONE CYPIONATE Inject 0.5 mLs into the muscle every 21 ( twenty-one) days. Now 300mg /ml   vardenafil 5 MG tablet Commonly known as: LEVITRA Take 5 mg by  mouth daily as needed for erectile dysfunction.   Vitamin D-3 25 MCG (1000 UT) Caps Take 1,000 Units by mouth every morning.        Allergies  Allergen Reactions   Codeine Anaphylaxis   Hydrocodone-Acetaminophen     Other reaction(s): Other (See Comments) Reactions unknown   Omeprazole Magnesium     Other reaction(s): Other (See Comments) Reaction Unknown   Tramadol     Other reaction(s): Other (See Comments) Reactions Unknown   Decadron [Dexamethasone]     Pt says it is contraindication due to the lithium he is taking   Fenofibrate     Other reaction(s): Other (See Comments) Reactions Unknown   Neomycin-Bacitracin Zn-Polymyx     Other reaction(s): Other (See Comments) Reactions Unknown   Other     Pt. Remarks that all pain med. Make him nauseated    Penicillin G     Other reaction(s): Other (See Comments) Reactions Unknown   Propoxyphene     Other reaction(s): Other (See Comments) Reaction Unknown   Statins     Lipitor, Crestor, Zocor all caused muscle aches   Ubidecarenone     Other reaction(s): Other (See Comments) Reactions Unknown   Zetia [Ezetimibe]     Muscle aches   Zoloft [Sertraline Hcl] Other (See Comments)    REACTION:  unknown     Procedures/Studies: CT ANGIO HEAD W OR WO CONTRAST  Result Date: 07/02/2021 CLINICAL DATA:  Aphasia and encephalopathy EXAM: CT ANGIOGRAPHY HEAD AND NECK TECHNIQUE: Multidetector CT imaging of the head and neck was performed using the standard protocol during bolus administration of intravenous contrast. Multiplanar CT image reconstructions and MIPs were obtained to evaluate the vascular anatomy. Carotid stenosis measurements (when applicable) are obtained utilizing NASCET criteria, using the distal internal carotid diameter as the denominator. CONTRAST:  56mL OMNIPAQUE IOHEXOL 350 MG/ML SOLN COMPARISON:  None. FINDINGS: CT HEAD FINDINGS Brain: There is no mass, hemorrhage or extra-axial collection. The size and configuration  of the ventricles and extra-axial CSF spaces are normal. There is hypoattenuation of the periventricular white matter, most commonly indicating chronic ischemic microangiopathy. Skull: The visualized skull base, calvarium and extracranial soft tissues are normal. Sinuses/Orbits: No fluid levels or advanced mucosal thickening of the visualized paranasal sinuses. No mastoid or middle ear effusion. The orbits are normal. CTA NECK FINDINGS SKELETON: There is no bony spinal canal stenosis. No lytic or blastic lesion. OTHER NECK: Normal pharynx, larynx and major salivary glands. No cervical lymphadenopathy. Unremarkable thyroid gland. UPPER CHEST: No pneumothorax or pleural effusion. No nodules or masses. AORTIC ARCH: There is no calcific atherosclerosis of the aortic arch. There is no aneurysm, dissection or hemodynamically significant stenosis of the visualized portion of the aorta. Conventional 3 vessel aortic branching pattern. The visualized proximal subclavian arteries are  widely patent. RIGHT CAROTID SYSTEM: No dissection, occlusion or aneurysm. Mild atherosclerotic calcification at the carotid bifurcation without hemodynamically significant stenosis. LEFT CAROTID SYSTEM: No dissection, occlusion or aneurysm. Mild atherosclerotic calcification at the carotid bifurcation without hemodynamically significant stenosis. VERTEBRAL ARTERIES: Left dominant configuration. Both origins are clearly patent. There is no dissection, occlusion or flow-limiting stenosis to the skull base (V1-V3 segments). CTA HEAD FINDINGS POSTERIOR CIRCULATION: --Vertebral arteries: Normal V4 segments. --Inferior cerebellar arteries: Normal. --Basilar artery: Normal. --Superior cerebellar arteries: Normal. --Posterior cerebral arteries (PCA): Normal. ANTERIOR CIRCULATION: --Intracranial internal carotid arteries: Normal. --Anterior cerebral arteries (ACA): Normal. Both A1 segments are present. Patent anterior communicating artery (a-comm).  --Middle cerebral arteries (MCA): Normal. VENOUS SINUSES: As permitted by contrast timing, patent. ANATOMIC VARIANTS: None Review of the MIP images confirms the above findings. IMPRESSION: 1. No emergent large vessel occlusion or hemodynamically significant stenosis of the head or neck. 2. Chronic ischemic microangiopathy. Electronically Signed   By: Ulyses Jarred M.D.   On: 07/02/2021 20:31   CT ANGIO NECK W OR WO CONTRAST  Result Date: 07/02/2021 CLINICAL DATA:  Aphasia and encephalopathy EXAM: CT ANGIOGRAPHY HEAD AND NECK TECHNIQUE: Multidetector CT imaging of the head and neck was performed using the standard protocol during bolus administration of intravenous contrast. Multiplanar CT image reconstructions and MIPs were obtained to evaluate the vascular anatomy. Carotid stenosis measurements (when applicable) are obtained utilizing NASCET criteria, using the distal internal carotid diameter as the denominator. CONTRAST:  96mL OMNIPAQUE IOHEXOL 350 MG/ML SOLN COMPARISON:  None. FINDINGS: CT HEAD FINDINGS Brain: There is no mass, hemorrhage or extra-axial collection. The size and configuration of the ventricles and extra-axial CSF spaces are normal. There is hypoattenuation of the periventricular white matter, most commonly indicating chronic ischemic microangiopathy. Skull: The visualized skull base, calvarium and extracranial soft tissues are normal. Sinuses/Orbits: No fluid levels or advanced mucosal thickening of the visualized paranasal sinuses. No mastoid or middle ear effusion. The orbits are normal. CTA NECK FINDINGS SKELETON: There is no bony spinal canal stenosis. No lytic or blastic lesion. OTHER NECK: Normal pharynx, larynx and major salivary glands. No cervical lymphadenopathy. Unremarkable thyroid gland. UPPER CHEST: No pneumothorax or pleural effusion. No nodules or masses. AORTIC ARCH: There is no calcific atherosclerosis of the aortic arch. There is no aneurysm, dissection or hemodynamically  significant stenosis of the visualized portion of the aorta. Conventional 3 vessel aortic branching pattern. The visualized proximal subclavian arteries are widely patent. RIGHT CAROTID SYSTEM: No dissection, occlusion or aneurysm. Mild atherosclerotic calcification at the carotid bifurcation without hemodynamically significant stenosis. LEFT CAROTID SYSTEM: No dissection, occlusion or aneurysm. Mild atherosclerotic calcification at the carotid bifurcation without hemodynamically significant stenosis. VERTEBRAL ARTERIES: Left dominant configuration. Both origins are clearly patent. There is no dissection, occlusion or flow-limiting stenosis to the skull base (V1-V3 segments). CTA HEAD FINDINGS POSTERIOR CIRCULATION: --Vertebral arteries: Normal V4 segments. --Inferior cerebellar arteries: Normal. --Basilar artery: Normal. --Superior cerebellar arteries: Normal. --Posterior cerebral arteries (PCA): Normal. ANTERIOR CIRCULATION: --Intracranial internal carotid arteries: Normal. --Anterior cerebral arteries (ACA): Normal. Both A1 segments are present. Patent anterior communicating artery (a-comm). --Middle cerebral arteries (MCA): Normal. VENOUS SINUSES: As permitted by contrast timing, patent. ANATOMIC VARIANTS: None Review of the MIP images confirms the above findings. IMPRESSION: 1. No emergent large vessel occlusion or hemodynamically significant stenosis of the head or neck. 2. Chronic ischemic microangiopathy. Electronically Signed   By: Ulyses Jarred M.D.   On: 07/02/2021 20:31   CT Angio Chest PE W and/or Wo Contrast  Result Date: 06/28/2021 CLINICAL DATA:  86 year old male recently diagnosed with bronchitis. Fever of 102.1. Shortness of breath. History of lung cancer status post right upper lobectomy and radiation. EXAM: CT ANGIOGRAPHY CHEST WITH CONTRAST TECHNIQUE: Multidetector CT imaging of the chest was performed using the standard protocol during bolus administration of intravenous contrast.  Multiplanar CT image reconstructions and MIPs were obtained to evaluate the vascular anatomy. CONTRAST:  43mL OMNIPAQUE IOHEXOL 350 MG/ML SOLN COMPARISON:  Portable chest 0149 hours today. Chest CT 01/26/2021 and earlier. FINDINGS: Cardiovascular: Good contrast bolus timing in the pulmonary arterial tree. Intermittent respiratory motion most pronounced at the lung apices and the lung bases. No central or hilar pulmonary artery filling defect. No segmental branch filling defect identified. Stable aorta with atherosclerosis. Heart size remains normal. No pericardial effusion. Mediastinum/Nodes: Stable mediastinum and hila with post treatment changes on the right. No lymphadenopathy. Lungs/Pleura: Status post right upper lobectomy with postradiation fibrosis about the right hilum. Major airways are stable. Mild respiratory motion. Stable right apical ground-glass opacity measuring 15 mm. But there is new multifocal peribronchial and peripheral/subpleural pulmonary opacity in both lower lobes greater on the left (series 6, image 81) which appears infectious/inflammatory. Multiple lower lobe segments affected bilaterally. And evidence of early left upper lobe involvement on image 53. No consolidation or pleural effusion. Upper Abdomen: Negative visible liver, gallbladder, spleen, pancreas, adrenal glands and bowel in the upper abdomen. Left renal upper pole cyst redemonstrated. Musculoskeletal: No acute or suspicious osseous lesion. Review of the MIP images confirms the above findings. IMPRESSION: 1. Intermittent respiratory motion degrades the subsegmental and distal branches. No acute pulmonary embolus is identified. 2. Multifocal lower lobe peribronchial and peripheral pulmonary opacity is most compatible with acute bilateral bronchopneumonia. Early left upper lobe involvement. No consolidation or pleural effusion. 3. Stable post treatment appearance of the right lung and hilum. Stable chronic right apical  ground-glass opacity. 4. Aortic Atherosclerosis (ICD10-I70.0). Electronically Signed   By: Genevie Ann M.D.   On: 06/28/2021 04:11   MR BRAIN WO CONTRAST  Result Date: 07/01/2021 CLINICAL DATA:  Encephalopathy EXAM: MRI HEAD WITHOUT CONTRAST TECHNIQUE: Multiplanar, multiecho pulse sequences of the brain and surrounding structures were obtained without intravenous contrast. COMPARISON:  02/20/2013 FINDINGS: Brain: No acute infarct, mass effect or extra-axial collection. No acute or chronic hemorrhage. Generalized diffuse volume loss. Parenchymal signal is normal. No hydrocephalus. The midline structures are normal. Vascular: Major flow voids are preserved. Skull and upper cervical spine: Normal calvarium and skull base. Visualized upper cervical spine and soft tissues are normal. Sinuses/Orbits:No paranasal sinus fluid levels or advanced mucosal thickening. No mastoid or middle ear effusion. Normal orbits. IMPRESSION: 1. No acute intracranial abnormality. 2. Generalized volume loss without lobar predilection. Electronically Signed   By: Ulyses Jarred M.D.   On: 07/01/2021 19:20   US RENAL  Result Date: 07/06/2021 CLINICAL DATA:  Urinary retention, history of BPH EXAM: RENAL / URINARY TRACT ULTRASOUND COMPLETE COMPARISON:  None FINDINGS: Right Kidney: Renal measurements: 11.3 x 5.2 x 5.1 cm = volume: 157 mL. Cortical thinning. Increased cortical echogenicity. Cyst at lower pole 4.6 x 3.6 x 3.8 cm, simple features. No additional mass, hydronephrosis, or shadowing calcification. Left Kidney: Renal measurements: 10.8 x 4.9 x 5.5 cm = volume: 152 mL. Cortical thinning. Increased cortical echogenicity. Small cyst at upper pole 1.8 x 1.7 x 1.8 cm, simple features. No additional mass, hydronephrosis, or shadowing calcification. Bladder: Decompressed by Foley catheter, unable to evaluate Other: N/A IMPRESSION: BILATERAL renal cysts. BILATERAL renal  cortical atrophy and medical renal disease changes. No additional renal  sonographic abnormalities. Electronically Signed   By: Lavonia Dana M.D.   On: 07/06/2021 17:16   DG CHEST PORT 1 VIEW  Result Date: 07/04/2021 CLINICAL DATA:  Follow-up pneumonia. EXAM: PORTABLE CHEST 1 VIEW COMPARISON:  Radiograph 06/30/2021.  CT 06/28/2021 FINDINGS: Postsurgical change in the right hemithorax with surgical clips and post treatment related change at the hilum and volume loss. Improved lung aeration from prior exam. Lung opacities on prior CT are not well seen by radiograph, however overall decreased basilar opacity. Stable heart size. No pneumothorax or large pleural effusion. IMPRESSION: Improved lung aeration from prior exam. Bibasilar opacities on prior CT are not well seen by radiograph, however are likely improved. Electronically Signed   By: Keith Rake M.D.   On: 07/04/2021 16:53   DG Chest Port 1 View  Result Date: 06/30/2021 CLINICAL DATA:  Pneumonia EXAM: PORTABLE CHEST 1 VIEW COMPARISON:  06/29/2021 FINDINGS: Unchanged AP portable chest radiograph, with support apparatus clipping endotracheal and esophagogastric tubes in appropriate position. Post treatment perihilar fibrosis of the right lung. Heterogeneous airspace opacity of the left lung base. Cardiomegaly. IMPRESSION: 1. Heterogeneous airspace opacity of the left lung base, unchanged, concerning for infection or aspiration. 2. Post treatment perihilar fibrosis of the right lung. 3. Unchanged support apparatus. 4. Cardiomegaly. Electronically Signed   By: Eddie Candle M.D.   On: 06/30/2021 08:10   DG CHEST PORT 1 VIEW  Result Date: 06/29/2021 CLINICAL DATA:  Intubation, shortness of breath EXAM: PORTABLE CHEST 1 VIEW COMPARISON:  06/29/2021, 2:34 p.m. FINDINGS: Interval placement of esophagogastric tube, tip below the diaphragm, side port near the gastroesophageal junction. Endotracheal tube remains in position over the mid trachea. Redemonstrated heterogeneous bibasilar airspace opacities and probable small layering  effusions, unchanged. Mild cardiomegaly. IMPRESSION: 1. Interval placement of esophagogastric tube, tip below the diaphragm, side port near the gastroesophageal junction. Consider slight advancement to ensure subdiaphragmatic position of both tip and side port. 2. Endotracheal tube remains in appropriate position over the mid trachea. 3. Unchanged heterogeneous bibasilar airspace opacities and probable small layering effusions. Electronically Signed   By: Eddie Candle M.D.   On: 06/29/2021 16:13   DG CHEST PORT 1 VIEW  Result Date: 06/29/2021 CLINICAL DATA:  Status post intubation EXAM: PORTABLE CHEST 1 VIEW COMPARISON:  06/28/2021 FINDINGS: Cardiac shadow is mildly prominent but stable. Endotracheal tube is noted 2.2 cm above the carina. Increasing basilar density is noted bilaterally worse on the left than the right consistent with developing infiltrates. No bony abnormality is noted. IMPRESSION: Endotracheal tube in satisfactory position. Developing infiltrates in the bases bilaterally left greater than right. Electronically Signed   By: Inez Catalina M.D.   On: 06/29/2021 14:48   DG Chest Port 1 View  Result Date: 06/28/2021 CLINICAL DATA:  Questionable sepsis. EXAM: PORTABLE CHEST 1 VIEW COMPARISON:  Chest radiograph dated 09/07/2013. FINDINGS: Minimal right lung base atelectasis. No focal consolidation, pleural effusion, or pneumothorax. Postsurgical changes of the right hilum with scarring. The cardiac silhouette is within limits. No acute osseous pathology. IMPRESSION: No active disease. Electronically Signed   By: Anner Crete M.D.   On: 06/28/2021 02:08   DG Abd Portable 1V  Result Date: 07/05/2021 CLINICAL DATA:  Abdominal distention EXAM: PORTABLE ABDOMEN - 1 VIEW COMPARISON:  06/29/2021 FINDINGS: The nasogastric tube has been removed. The stomach remains decompressed. Small bowel decompressed. There is gaseous distention of the colon with oral contrast in the appendix and  ascending  segment. Mild degenerative changes in the lower lumbar spine. IMPRESSION: Gaseous distention of colon Electronically Signed   By: Lucrezia Europe M.D.   On: 07/05/2021 13:49   DG Abd Portable 1V  Result Date: 06/29/2021 CLINICAL DATA:  Orogastric tube placement EXAM: PORTABLE ABDOMEN - 1 VIEW COMPARISON:  06/29/2021, 3:31 p.m. FINDINGS: Interval advancement of esophagogastric tube, tip now in the vicinity of the pylorus and side port below the diaphragm. Nonobstructive pattern of bowel gas. No free air in the abdomen. IMPRESSION: Interval advancement of esophagogastric tube, tip now in the vicinity of the pylorus and side port below the diaphragm. Electronically Signed   By: Eddie Candle M.D.   On: 06/29/2021 17:25   DG Abd Portable 1V  Result Date: 06/29/2021 CLINICAL DATA:  NG tube placement EXAM: PORTABLE ABDOMEN - 1 VIEW COMPARISON:  Chest radiograph same day FINDINGS: NG tube extends the stomach. Side port is at the GE junction. Mild basilar atelectasis. No evidence of bowel obstruction. IMPRESSION: NG tube in stomach.  Side port at GE junction. No bowel obstruction Electronically Signed   By: Suzy Bouchard M.D.   On: 06/29/2021 15:56   DG Swallowing Func-Speech Pathology  Result Date: 07/02/2021 Table formatting from the original result was not included. Objective Swallowing Evaluation: Type of Study: MBS-Modified Barium Swallow Study  Patient Details Name: Alvin Hardy MRN: [id number] Date of Birth: 04/02/1935 Today's Date: 07/02/2021 Time: SLP Start Time (ACUTE ONLY): 1305 -SLP Stop Time (ACUTE ONLY): 6759 SLP Time Calculation (min) (ACUTE ONLY): 30 min Past Medical History: Past Medical History: Diagnosis Date  Allergy   Arthritis   psoriatic arthritis, ? , treated /w mmethotrexate   Bipolar 1 disorder ([place])   BPH (benign prostatic hyperplasia)   Cataract 2010  Bilateral  Chest pain   Depression   Deviated septum   TO THE LEFT  ED (erectile dysfunction)   Essential hypertension 06/28/2021  Finger  injury   mallet finger-- 02/13/2013, cast in place, followed by dr. Fredna Dow  GERD (gastroesophageal reflux disease)   Gilbert's syndrome   History of radiation therapy 07/24/13-08/27/13  50Gy/25 fx chest  Hyperlipidemia   Hypogonadism male   Laryngopharyngeal reflux   lung ca dx'd 12/2012  rul  Lung mass 02/07/13  RIGHT UPPER LOBE  Morton's neuroma   LEFT FOOT  Neuromuscular disorder ([place])   peripheral neuropathy  Shortness of breath   voice breaks, ? related to reflux, although told by Dr. Ardis Hughs, S.- early 24 yr. old Past Surgical History: Past Surgical History: Procedure Laterality Date  CYSTOSCOPY N/A 02/21/2013  Procedure: CYSTOSCOPY FLEXIBLE with insertion of foley catheter;  Surgeon: Fredricka Bonine, MD;  Location: [facility];  Service: Urology;  Laterality: N/A;  EYE SURGERY    cataracts removed fr. both eyes, IOL in place   KNEE ARTHROSCOPY Right 01/2007  PILONIDAL CYST EXCISION  1960's  PROSTATE SURGERY  1996  TURP  VIDEO ASSISTED THORACOSCOPY (VATS)/WEDGE RESECTION Right 02/21/2013  Procedure: Right VIDEO ASSISTED THORACOSCOPY ,Thoracotomy with right upper lobectomy, node sampling ;  Surgeon: Grace Isaac, MD;  Location: [facility];  Service: Thoracic;  Laterality: Right;  VIDEO BRONCHOSCOPY N/A 02/21/2013  Procedure: VIDEO BRONCHOSCOPY;  Surgeon: Grace Isaac, MD;  Location: [facility];  Service: Thoracic;  Laterality: N/A; HPI: Pt is an 86 yo male adm to [facility] with respiratory deficits- requiring intubation.  [name] s s/p intubation x3 days - 8/29-8/31.  Pt failed yale six hours post extubation, asp pna, asp pneumonitis, h/o reflux,  h/o LPR, 86 year old man who presented to [facility] ED 8/28 for SOB and fever. Found to have multifocal PNA. PMHx significant for lung CA (in remission, s/p lobectomy), HTN, HLD, GERD, Gilbert's syndrome, anxiety, bipolar disorder.     Initially seen by PCP a few days prior to admission, placed on doxycycline without improvement; he was subsequently placed on azithromycin and prednisone and  Decadron x 1 and albuterol were administered in the office per RD notes. ,Swallow eval ordered due to pt failing yale swallow screen.  Subjective: pt awake in chair Assessment / Plan / Recommendation CHL IP CLINICAL IMPRESSIONS 07/02/2021 Clinical Impression Pt with mild oral dysphagia and intact pharyngeal swallow ability.   Impaired lingual control resulting in lingual pumping and premature spillage.  Pharyngeal swallow is strong and timely without retention.    NO aspiration of any consistency noted however poor oral control increases asp risk with solids requiring mastication and thinner liquids.  In addition, pt's rapid fatigue and tenuous respiratory status increases aspiration pna risk.  Suspect pt's explosive cough during clinical swallow eval in am was due to spillage of water into airway prior to swallow and this was noted to cause significant discomfort.   Chin tuck posture tested during MBS to assess for improved airway protection/closure prior to swallow - but it was difficult for pt to perform.  Pt did cough x2 during MBS without barium visualized in larynx/trachea.  Recommend conservative diet of dys1/nectar via cup/straw and tsps of thin allowed anytime.  Anticipate pt will be eligible for rapid dietary advancement with improved respiratory status and improved oral/lingual control. SLP Visit Diagnosis Dysphagia, oral phase (R13.11) Attention and concentration deficit following -- Frontal lobe and executive function deficit following -- Impact on safety and function Moderate aspiration risk   CHL IP TREATMENT RECOMMENDATION 07/02/2021 Treatment Recommendations Therapy as outlined in treatment plan below   Prognosis 07/02/2021 Prognosis for Safe Diet Advancement Fair Barriers to Reach Goals Time post onset Barriers/Prognosis Comment -- CHL IP DIET RECOMMENDATION 07/02/2021 SLP Diet Recommendations Dysphagia 1 (Puree) solids;Nectar thick liquid Liquid Administration via Cup;Straw Medication Administration Whole  meds with liquid Compensations Slow rate;Small sips/bites Postural Changes Remain semi-upright after after feeds/meals (Comment);Seated upright at 90 degrees   CHL IP OTHER RECOMMENDATIONS 07/02/2021 Recommended Consults -- Oral Care Recommendations Oral care BID Other Recommendations --   CHL IP FOLLOW UP RECOMMENDATIONS 07/02/2021 Follow up Recommendations Skilled Nursing facility   [facility] IP FREQUENCY AND DURATION 07/02/2021 Speech Therapy Frequency (ACUTE ONLY) min 2x/week Treatment Duration --      CHL IP ORAL PHASE 07/02/2021 Oral Phase Impaired Oral - Pudding Teaspoon -- Oral - Pudding Cup -- Oral - Honey Teaspoon -- Oral - Honey Cup -- Oral - Nectar Teaspoon Weak lingual manipulation;Premature spillage Oral - Nectar Cup Lingual pumping;Premature spillage;Weak lingual manipulation Oral - Nectar Straw Premature spillage;Lingual pumping;Weak lingual manipulation Oral - Thin Teaspoon Premature spillage;Weak lingual manipulation Oral - Thin Cup Premature spillage;Weak lingual manipulation Oral - Thin Straw Premature spillage;Weak lingual manipulation Oral - Puree Delayed oral transit Oral - Mech Soft Delayed oral transit;Premature spillage Oral - Regular -- Oral - Multi-Consistency -- Oral - Pill -- Oral Phase - Comment --  CHL IP PHARYNGEAL PHASE 07/02/2021 Pharyngeal Phase WFL Pharyngeal- Pudding Teaspoon -- Pharyngeal -- Pharyngeal- Pudding Cup -- Pharyngeal -- Pharyngeal- Honey Teaspoon -- Pharyngeal -- Pharyngeal- Honey Cup -- Pharyngeal -- Pharyngeal- Nectar Teaspoon -- Pharyngeal -- Pharyngeal- Nectar Cup -- Pharyngeal -- Pharyngeal- Nectar Straw -- Pharyngeal -- Pharyngeal- Thin Teaspoon --  Pharyngeal -- Pharyngeal- Thin Cup -- Pharyngeal -- Pharyngeal- Thin Straw -- Pharyngeal -- Pharyngeal- Puree -- Pharyngeal -- Pharyngeal- Mechanical Soft -- Pharyngeal -- Pharyngeal- Regular -- Pharyngeal -- Pharyngeal- Multi-consistency -- Pharyngeal -- Pharyngeal- Pill -- Pharyngeal -- Pharyngeal Comment --  CHL IP CERVICAL  ESOPHAGEAL PHASE 07/02/2021 Cervical Esophageal Phase WFL Pudding Teaspoon -- Pudding Cup -- Honey Teaspoon -- Honey Cup -- Nectar Teaspoon -- Nectar Cup -- Nectar Straw -- Thin Teaspoon -- Thin Cup -- Thin Straw -- Puree -- Mechanical Soft -- Regular -- Multi-consistency -- Pill -- Cervical Esophageal Comment -- Kathleen Lime, MS [facility] SLP Acute Rehab Services Office [phone] Pager [phone] Macario Golds 07/02/2021, 6:35 PM              EEG adult  Result Date: 07/03/2021 Lora Havens, MD     07/03/2021 11:58 AM Patient Name: Alvin Hardy MRN: [id number] Epilepsy Attending: Lora Havens Referring Physician/Provider: Dr Margaretha Seeds Date: 07/03/2021 Duration: 25.32 mins Patient history: 86 year old male with speech disturbance as well as altered mental status.  EEG to evaluate for seizures. Level of alertness: Awake, asleep AEDs during EEG study: GBP, Clonazepam Technical aspects: This EEG study was done with scalp electrodes positioned according to the 10-20 International system of electrode placement. Electrical activity was acquired at a sampling rate of 500Hz  and reviewed with a high frequency filter of 70Hz  and a low frequency filter of 1Hz . EEG data were recorded continuously and digitally stored. Description: No clear posterior dominant rhythm was seen. Sleep was characterized by sleep spindles (12 to 14 Hz), maximal frontocentral region.  EEG showed continuous generalized 5 to 6 Hz theta-delta slowing as well as intermittent generalized 2 to 3 Hz delta slowing.  Hyperventilation and photic stimulation were not performed.   ABNORMALITY - Continuous slow, generalized IMPRESSION: This study is suggestive of moderate diffuse encephalopathy, nonspecific etiology. No seizures or epileptiform discharges were seen throughout the recording. Lora Havens   ECHOCARDIOGRAM COMPLETE  Result Date: 06/30/2021    ECHOCARDIOGRAM REPORT   Patient Name:   Alvin Hardy [facility] Date of Exam: 06/30/2021 Medical  Rec #:  [id number]       Height:       70.0 in Accession #:    [id number]      Weight:       191.6 lb Date of Birth:  11-Feb-1935       BSA:          2.050 m Patient Age:    77 years        BP:           139/86 mmHg Patient Gender: M               HR:           56 bpm. Exam Location:  Inpatient Procedure: 2D Echo, Cardiac Doppler and Color Doppler Indications:    Acute Respiratory Distress  History:        Patient has prior history of Echocardiogram examinations, most                 recent 02/18/2015. Signs/Symptoms:Shortness of Breath and Chest                 Pain; Risk Factors:Hypertension, Dyslipidemia and Diabetes.  Sonographer:    Bernadene Person RDCS Referring Phys: [id number] [place]  1. Left ventricular ejection fraction, by estimation, is 45 to 50%. The left ventricle has mildly decreased function. The  left ventricle demonstrates global hypokinesis. Left ventricular diastolic parameters are consistent with Grade II diastolic dysfunction (pseudonormalization). Elevated left atrial pressure.  2. Right ventricular systolic function is mildly reduced. The right ventricular size is mildly enlarged. Tricuspid regurgitation signal is inadequate for assessing PA pressure.  3. The mitral valve is normal in structure. Trivial mitral valve regurgitation.  4. The aortic valve was not well visualized. Aortic valve regurgitation is not visualized. No aortic stenosis is present.  5. The inferior vena cava is normal in size with <50% respiratory variability, suggesting right atrial pressure of 8 mmHg. FINDINGS  Left Ventricle: Left ventricular ejection fraction, by estimation, is 45 to 50%. The left ventricle has mildly decreased function. The left ventricle demonstrates global hypokinesis. The left ventricular internal cavity size was normal in size. There is  no left ventricular hypertrophy. Left ventricular diastolic parameters are consistent with Grade II diastolic dysfunction (pseudonormalization).  Elevated left atrial pressure. Right Ventricle: The right ventricular size is mildly enlarged. No increase in right ventricular wall thickness. Right ventricular systolic function is mildly reduced. Tricuspid regurgitation signal is inadequate for assessing PA pressure. Left Atrium: Left atrial size was normal in size. Right Atrium: Right atrial size was normal in size. Pericardium: There is no evidence of pericardial effusion. Mitral Valve: The mitral valve is normal in structure. Trivial mitral valve regurgitation. Tricuspid Valve: The tricuspid valve is normal in structure. Tricuspid valve regurgitation is not demonstrated. Aortic Valve: The aortic valve was not well visualized. Aortic valve regurgitation is not visualized. No aortic stenosis is present. Pulmonic Valve: The pulmonic valve was not well visualized. Pulmonic valve regurgitation is not visualized. Aorta: The aortic root was not well visualized. Venous: The inferior vena cava is normal in size with less than 50% respiratory variability, suggesting right atrial pressure of 8 mmHg. IAS/Shunts: No atrial level shunt detected by color flow Doppler.  LEFT VENTRICLE PLAX 2D LVIDd:         5.00 cm      Diastology LVIDs:         4.10 cm      LV e' medial:    4.05 cm/s LV PW:         1.00 cm      LV E/e' medial:  20.6 LV IVS:        1.20 cm      LV e' lateral:   5.57 cm/s                             LV E/e' lateral: 15.0  LV Volumes (MOD) LV vol d, MOD A2C: 74.3 ml LV vol d, MOD A4C: 121.0 ml LV vol s, MOD A2C: 44.8 ml LV vol s, MOD A4C: 50.4 ml LV SV MOD A2C:     29.5 ml LV SV MOD A4C:     121.0 ml LV SV MOD BP:      50.3 ml RIGHT VENTRICLE RV S prime:     10.30 cm/s TAPSE (M-mode): 1.5 cm LEFT ATRIUM             Index       RIGHT ATRIUM           Index LA diam:        3.60 cm 1.76 cm/m  RA Area:     14.00 cm LA Vol (A2C):   30.2 ml 14.74 ml/m RA Volume:   28.30 ml  13.81 ml/m LA Vol (A4C):  29.3 ml 14.30 ml/m LA Biplane Vol: 31.2 ml 15.22 ml/m  AORTIC  VALVE LVOT Vmax:   88.90 cm/s LVOT Vmean:  58.900 cm/s LVOT VTI:    0.194 m  AORTA Ao Asc diam: 3.30 cm MITRAL VALVE MV Area (PHT): 3.85 cm    SHUNTS MV Decel Time: 197 msec    Systemic VTI: 0.19 m MV E velocity: 83.30 cm/s MV A velocity: 72.10 cm/s MV E/A ratio:  1.16 Mihai Croitoru MD Electronically signed by Sanda Klein MD Signature Date/Time: 06/30/2021/5:27:09 PM    Final        Discharge Exam: Vitals:   07/08/21 0606 07/08/21 0740  BP: 134/87   Pulse: 89   Resp: 17   Temp: 98.7 F (37.1 C)   SpO2: 99% 96%    General: Pt is alert, awake, not in acute distress Cardiovascular: RRR, S1/S2 +, no edema Respiratory: CTA bilaterally, no wheezing, no rhonchi, no respiratory distress, no conversational dyspnea, on room air  Abdominal: Soft, NT, ND, bowel sounds + Extremities: no edema, no cyanosis Psych: Normal mood and affect, stable judgement and insight     The results of significant diagnostics from this hospitalization (including imaging, microbiology, ancillary and laboratory) are listed below for reference.     Microbiology: Recent Results (from the past 240 hour(s))  Respiratory (~20 pathogens) panel by PCR     Status: None   Collection Time: 06/29/21  1:24 PM   Specimen: Nasopharyngeal Swab; Respiratory  Result Value Ref Range Status   Adenovirus NOT DETECTED NOT DETECTED Final   Coronavirus 229E NOT DETECTED NOT DETECTED Final    Comment: (NOTE) The Coronavirus on the Respiratory Panel, DOES NOT test for the novel  Coronavirus (2019 nCoV)    Coronavirus HKU1 NOT DETECTED NOT DETECTED Final   Coronavirus NL63 NOT DETECTED NOT DETECTED Final   Coronavirus OC43 NOT DETECTED NOT DETECTED Final   Metapneumovirus NOT DETECTED NOT DETECTED Final   Rhinovirus / Enterovirus NOT DETECTED NOT DETECTED Final   Influenza A NOT DETECTED NOT DETECTED Final   Influenza B NOT DETECTED NOT DETECTED Final   Parainfluenza Virus 1 NOT DETECTED NOT DETECTED Final   Parainfluenza  Virus 2 NOT DETECTED NOT DETECTED Final   Parainfluenza Virus 3 NOT DETECTED NOT DETECTED Final   Parainfluenza Virus 4 NOT DETECTED NOT DETECTED Final   Respiratory Syncytial Virus NOT DETECTED NOT DETECTED Final   Bordetella pertussis NOT DETECTED NOT DETECTED Final   Bordetella Parapertussis NOT DETECTED NOT DETECTED Final   Chlamydophila pneumoniae NOT DETECTED NOT DETECTED Final   Mycoplasma pneumoniae NOT DETECTED NOT DETECTED Final    Comment: Performed at [facility], [address], [place], [place] [address]  Culture, Respiratory w Gram Stain     Status: None   Collection Time: 06/30/21  4:15 PM   Specimen: Tracheal Aspirate; Respiratory  Result Value Ref Range Status   Specimen Description   Final    TRACHEAL ASPIRATE Performed at [place] [address], [place], [place] [address]    Special Requests   Final    NONE Performed at [facility], [place] [address] [place], [place] [address]    Gram Stain   Final    FEW SQUAMOUS EPITHELIAL CELLS PRESENT FEW WBC SEEN FEW GRAM POSITIVE COCCI FEW GRAM NEGATIVE RODS FEW GRAM POSITIVE RODS    Culture   Final    FEW Normal respiratory flora-no Staph aureus or Pseudomonas seen Performed at [facility], [place] [address], [place], [place] [address]    Report Status 07/03/2021 FINAL  Final     Labs: BNP (last 3 results) Recent Labs    06/28/21 0100  BNP 989.2*   Basic Metabolic Panel: Recent Labs  Lab 07/04/21 0358 07/05/21 0901 07/06/21 0321 07/07/21 0602 07/07/21 1538 07/08/21 0345  NA 146* 146* 146* 144  --  142  K 3.1* 3.2* 3.1* 2.6* 3.6 2.9*  CL 112* 113* 111 106  --  108  CO2 25 28 27 29   --  28  GLUCOSE 147* 157* 113* 108*  --  114*  BUN 35* 28* 26* 28*  --  23  CREATININE 1.11 1.14 1.05 1.15  --  1.03  CALCIUM 8.4* 8.7* 8.5* 8.1*  --  8.2*  MG  --   --  2.4  --   --  2.6*  PHOS  --   --  3.2  --   --  2.8   Liver Function Tests: No results for  input(s): AST, ALT, ALKPHOS, BILITOT, PROT, ALBUMIN in the last 168 hours. No results for input(s): LIPASE, AMYLASE in the last 168 hours. No results for input(s): AMMONIA in the last 168 hours. CBC: Recent Labs  Lab 07/03/21 0254 07/04/21 0358 07/05/21 0812 07/06/21 0321 07/08/21 0345  WBC 17.4* 17.9* 18.7* 13.7* 16.4*  NEUTROABS  --  14.9*  --   --   --   HGB 14.6 15.7 16.9 15.8 14.4  HCT 46.4 49.2 53.8* 50.9 45.5  MCV 87.9 87.9 88.5 88.8 88.5  PLT 315 355 302 230 187   Cardiac Enzymes: No results for input(s): CKTOTAL, CKMB, CKMBINDEX, TROPONINI in the last 168 hours. BNP: Invalid input(s): POCBNP CBG: Recent Labs  Lab 07/07/21 1104 07/07/21 1617 07/07/21 2110 07/08/21 0028 07/08/21 0743  GLUCAP 117* 133* 150* 105* 110*   D-Dimer No results for input(s): DDIMER in the last 72 hours. Hgb A1c No results for input(s): HGBA1C in the last 72 hours. Lipid Profile No results for input(s): CHOL, HDL, LDLCALC, TRIG, CHOLHDL, LDLDIRECT in the last 72 hours. Thyroid function studies No results for input(s): TSH, T4TOTAL, T3FREE, THYROIDAB in the last 72 hours.  Invalid input(s): FREET3 Anemia work up No results for input(s): VITAMINB12, FOLATE, FERRITIN, TIBC, IRON, RETICCTPCT in the last 72 hours. Urinalysis    Component Value Date/Time   COLORURINE STRAW (A) 06/28/2021 0450   APPEARANCEUR CLEAR 06/28/2021 0450   LABSPEC 1.015 06/28/2021 0450   PHURINE 6.0 06/28/2021 0450   GLUCOSEU NEGATIVE 06/28/2021 0450   HGBUR MODERATE (A) 06/28/2021 0450   BILIRUBINUR NEGATIVE 06/28/2021 0450   KETONESUR NEGATIVE 06/28/2021 0450   PROTEINUR NEGATIVE 06/28/2021 0450   UROBILINOGEN 0.2 09/07/2013 1437   NITRITE NEGATIVE 06/28/2021 0450   LEUKOCYTESUR NEGATIVE 06/28/2021 0450   Sepsis Labs Invalid input(s): PROCALCITONIN,  WBC,  LACTICIDVEN Microbiology Recent Results (from the past 240 hour(s))  Respiratory (~20 pathogens) panel by PCR     Status: None   Collection Time:  06/29/21  1:24 PM   Specimen: Nasopharyngeal Swab; Respiratory  Result Value Ref Range Status   Adenovirus NOT DETECTED NOT DETECTED Final   Coronavirus 229E NOT DETECTED NOT DETECTED Final    Comment: (NOTE) The Coronavirus on the Respiratory Panel, DOES NOT test for the novel  Coronavirus (2019 nCoV)    Coronavirus HKU1 NOT DETECTED NOT DETECTED Final   Coronavirus NL63 NOT DETECTED NOT DETECTED Final   Coronavirus OC43 NOT DETECTED NOT DETECTED Final  Metapneumovirus NOT DETECTED NOT DETECTED Final   Rhinovirus / Enterovirus NOT DETECTED NOT DETECTED Final   Influenza A NOT DETECTED NOT DETECTED Final   Influenza B NOT DETECTED NOT DETECTED Final   Parainfluenza Virus 1 NOT DETECTED NOT DETECTED Final   Parainfluenza Virus 2 NOT DETECTED NOT DETECTED Final   Parainfluenza Virus 3 NOT DETECTED NOT DETECTED Final   Parainfluenza Virus 4 NOT DETECTED NOT DETECTED Final   Respiratory Syncytial Virus NOT DETECTED NOT DETECTED Final   Bordetella pertussis NOT DETECTED NOT DETECTED Final   Bordetella Parapertussis NOT DETECTED NOT DETECTED Final   Chlamydophila pneumoniae NOT DETECTED NOT DETECTED Final   Mycoplasma pneumoniae NOT DETECTED NOT DETECTED Final    Comment: Performed at [facility], [place] [address], [place], [place] [address]  Culture, Respiratory w Gram Stain     Status: None   Collection Time: 06/30/21  4:15 PM   Specimen: Tracheal Aspirate; Respiratory  Result Value Ref Range Status   Specimen Description   Final    TRACHEAL ASPIRATE Performed at [place] [address], [place], [place] [address]    Special Requests   Final    NONE Performed at [facility] [place] [address], [place], [place] [address]    Gram Stain   Final    FEW SQUAMOUS EPITHELIAL CELLS PRESENT FEW WBC SEEN FEW GRAM POSITIVE COCCI FEW GRAM NEGATIVE RODS FEW GRAM POSITIVE RODS    Culture   Final    FEW Normal respiratory flora-no Staph aureus  or Pseudomonas seen Performed at [facility], [address], [place], [place] [address]    Report Status 07/03/2021 FINAL  Final     Patient was seen and examined on the day of discharge and was found to be in stable condition. Time coordinating discharge: 35 minutes including assessment and coordination of care, as well as examination of the patient.   SIGNED:  Dessa Phi, DO [facility] 07/08/2021, 10:28 AM

## 2021-07-08 NOTE — TOC Progression Note (Signed)
Transition of Care [facility]) - Progression Note    Patient Details  Name: Alvin Hardy MRN: [id number] Date of Birth: Oct 21, 1935  Transition of [facility] CM/SW Contact  Ross Ludwig, [place] Phone Number: 07/08/2021, 9:57 AM  Clinical Narrative:    CSW spoke to [facility] admissions worker Dian Situ, she said she needs to try to move around some beds to get patient a SNF bed.  She will call CSW back to see if she can accept him today.   Expected Discharge Plan: Home/Self Care Barriers to Discharge: Continued Medical Work up  Expected Discharge Plan and Services Expected Discharge Plan: Home/Self Care   Discharge Planning Services: CM Consult   Living arrangements for the past 2 months: Apartment                                       Social Determinants of Health (SDOH) Interventions    Readmission Risk Interventions No flowsheet data found.

## 2021-08-18 NOTE — Progress Notes (Signed)
Synopsis: Referred for COPD by Crist Infante, MD  Subjective:   PATIENT ID: Alvin Hardy GENDER: male DOB: 1935-07-09, MRN: [id number]  Chief Complaint  Patient presents with   Consult    Pt. Wants to talk about his shortness of breath and check his lungs.    86yM with COPD previously followed by Dr. Lamonte Sakai, stage IIIa RUL adenoCA s/p lobectomy, chemoXRT, psoriatic arthritis previously on methotrexate 2016, remote smoking pipes mostly - quit in 1995 30 years or so, radiation pneumonitis in 2014, GERD, LPR  06/28/21 had admission to [facility] for multifocal pneumonia, AECOPD requiring intubation and ICU admission, course c/b encephalopathy that cleared, urinary retention. Discharged to SNF. Has been home for a couple weeks.   He is taking dulera 2 puffs twice daily, forgets to rinse his mouth. He had some DOE prior to 8/28 admission. At this point he feels like tires easily - not necessarily due to dyspnea. He says walking he feels more limited by leg weakness. He has no substantial cough productive or otherwise. No CP. He doesn't think he has any overt issues with solid or liquid food dysphagia/aspiration - speech eval was ok after encephalopathy cleared.   Otherwise pertinent review of systems is negative.  He has no family history of lung disease.  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   Past Medical History:  Diagnosis Date   Allergy    Arthritis    psoriatic arthritis, ? , treated /w mmethotrexate    Bipolar 1 disorder ([place])    BPH (benign prostatic hyperplasia)    Cataract 2010   Bilateral   Chest pain    Depression    Deviated septum    TO THE LEFT   ED (erectile dysfunction)    Essential hypertension 06/28/2021   Finger injury    mallet finger-- 02/13/2013, cast in place, followed by dr. Fredna Dow   GERD (gastroesophageal reflux disease)    Gilbert's syndrome    History of radiation therapy 07/24/13-08/27/13   50Gy/25 fx chest    Hyperlipidemia    Hypogonadism male    Laryngopharyngeal reflux    lung ca dx'd 12/2012   rul   Lung mass 02/07/13   RIGHT UPPER LOBE   Morton's neuroma    LEFT FOOT   Neuromuscular disorder ([place])    peripheral neuropathy   Shortness of breath    voice breaks, ? related to reflux, although told by Dr. Ardis Hughs, S.- early 86 yr. old     Family History  Problem Relation Age of Onset   Diabetes Mother    Cancer Mother        abdomen   Heart disease Father    Diabetes Son    Cancer Maternal Aunt        lung ca, smoker     Past Surgical History:  Procedure Laterality Date   CYSTOSCOPY N/A 02/21/2013   Procedure: CYSTOSCOPY FLEXIBLE with insertion of foley catheter;  Surgeon: Fredricka Bonine, MD;  Location: [facility];  Service: Urology;  Laterality: N/A;   EYE SURGERY     cataracts removed fr. both eyes, IOL in place    KNEE ARTHROSCOPY Right 01/2007   PILONIDAL CYST EXCISION  1960's   PROSTATE SURGERY  1996   TURP   VIDEO ASSISTED THORACOSCOPY (VATS)/WEDGE RESECTION Right 02/21/2013   Procedure: Right VIDEO ASSISTED THORACOSCOPY ,Thoracotomy with right upper lobectomy, node sampling ;  Surgeon: Grace Isaac, MD;  Location:  [facility] OR;  Service: Thoracic;  Laterality: Right;   VIDEO BRONCHOSCOPY N/A 02/21/2013   Procedure: VIDEO BRONCHOSCOPY;  Surgeon: Grace Isaac, MD;  Location: [facility] OR;  Service: Thoracic;  Laterality: N/A;    Social History   Socioeconomic History   Marital status: Married    Spouse name: Not on file   Number of children: Not on file   Years of education: Not on file   Highest education level: Not on file  Occupational History   Not on file  Tobacco Use   Smoking status: Former    Types: Cigarettes    Quit date: 11/01/1978    Years since quitting: 42.8   Smokeless tobacco: Never  Substance and Sexual Activity   Alcohol use: Yes    Comment: occas.   Drug use: No   Sexual activity: Not on file  Other Topics Concern   Not on file  Social  History Narrative   Not on file   Social Determinants of Health   Financial Resource Strain: Not on file  Food Insecurity: Not on file  Transportation Needs: Not on file  Physical Activity: Not on file  Stress: Not on file  Social Connections: Not on file  Intimate Partner Violence: Not on file     Allergies  Allergen Reactions   Codeine Anaphylaxis   Hydrocodone-Acetaminophen     Other reaction(s): Other (See Comments) Reactions unknown   Omeprazole Magnesium     Other reaction(s): Other (See Comments) Reaction Unknown   Tramadol     Other reaction(s): Other (See Comments) Reactions Unknown   Decadron [Dexamethasone]     Pt says it is contraindication due to the lithium he is taking   Fenofibrate     Other reaction(s): Other (See Comments) Reactions Unknown   Neomycin-Bacitracin Zn-Polymyx     Other reaction(s): Other (See Comments) Reactions Unknown   Other     Pt. Remarks that all pain med. Make him nauseated    Penicillin G     Other reaction(s): Other (See Comments) Reactions Unknown   Propoxyphene     Other reaction(s): Other (See Comments) Reaction Unknown   Statins     Lipitor, Crestor, Zocor all caused muscle aches   Ubidecarenone     Other reaction(s): Other (See Comments) Reactions Unknown   Zetia [Ezetimibe]     Muscle aches   Zoloft [Sertraline Hcl] Other (See Comments)    REACTION:  unknown     Outpatient Medications Prior to Visit  Medication Sig Dispense Refill   albuterol (VENTOLIN HFA) 108 (90 Base) MCG/ACT inhaler Inhale 2 puffs into the lungs every 4 (four) hours as needed for shortness of breath.     aspirin EC 81 MG tablet Take 81 mg by mouth every morning.      Calcium Carbonate-Vitamin D 600-400 MG-UNIT tablet Take 1 tablet by mouth every morning.     Cholecalciferol (VITAMIN D-3) 1000 UNITS CAPS Take 1,000 Units by mouth every morning.      clobetasol cream (TEMOVATE) 4.09 % Apply 1 application topically daily. Reported on 01/27/2016      clonazePAM (KLONOPIN) 0.5 MG tablet Take 1 tablet (0.5 mg total) by mouth at bedtime. 30 tablet 0   cyanocobalamin 1000 MCG tablet Take 1,000 mcg by mouth every morning.      esomeprazole (NEXIUM) 40 MG capsule Take 40 mg by mouth daily before breakfast.     finasteride (PROSCAR) 5 MG tablet Take 5 mg by mouth daily before breakfast.  gabapentin (NEURONTIN) 300 MG capsule Take 1,200 mg by mouth at bedtime. He takes two capsules every morning and three capsules at bedtime.     lithium carbonate (LITHOBID) 300 MG CR tablet Take 600 mg by mouth at bedtime.      losartan (COZAAR) 50 MG tablet Take 1 tablet (50 mg total) by mouth daily. 30 tablet 2   Multiple Vitamin (MULTIVITAMIN WITH MINERALS) TABS tablet Take 1 tablet by mouth every morning.     OVER THE COUNTER MEDICATION Take 1 tablet by mouth 2 (two) times daily. Glucosamine Chondroitin 1500mg /1200mg      predniSONE (DELTASONE) 10 MG tablet Take 4 tabs for 3 days, then 3 tabs for 3 days, then 2 tabs for 3 days, then 1 tab for 3 days, then 1/2 tab for 4 days. 32 tablet 0   Probiotic Product (PROBIOTIC DAILY PO) Take 1 each by mouth daily.     propranolol (INDERAL) 10 MG tablet Take 10-20 mg by mouth daily as needed (For extremely stressful circumstances.).      REPATHA SURECLICK 037 MG/ML SOAJ Inject 140 mg as directed every 14 (fourteen) days.     tamsulosin (FLOMAX) 0.4 MG CAPS capsule Take 0.4 mg by mouth daily.  3   testosterone cypionate (DEPOTESTOTERONE CYPIONATE) 200 MG/ML injection Inject 0.5 mLs into the muscle every 21 ( twenty-one) days. Now 300mg /ml  1   vardenafil (LEVITRA) 5 MG tablet Take 5 mg by mouth daily as needed for erectile dysfunction.      mometasone-formoterol (DULERA) 200-5 MCG/ACT AERO Inhale 2 puffs into the lungs 2 (two) times daily. 1 each 2   No facility-administered medications prior to visit.       Objective:   Physical Exam:  General appearance: 86 y.o., male, NAD, conversant  Eyes: anicteric  sclerae, moist conjunctivae; no lid-lag; PERRL, tracking appropriately HENT: NCAT; oropharynx, MMM, no mucosal ulcerations; normal hard and soft palate Neck: Trachea midline; no lymphadenopathy, no JVD Lungs: CTAB, no crackles, no wheeze, with normal respiratory effort CV: RRR, no MRGs  Abdomen: Soft, non-tender; non-distended, BS present  Extremities: 2+ BLE edema, radial and DP pulses present bilaterally  Skin: Normal temperature, turgor and texture; no rash Psych: Appropriate affect Neuro: Alert and oriented to person and place, no focal deficit    Vitals:   08/19/21 1045  BP: 126/68  Pulse: (!) 109  Temp: 97.8 F (36.6 C)  TempSrc: Oral  SpO2: 98%  Weight: 190 lb 9.6 oz (86.5 kg)  Height: 5\' 10"  (1.778 m)   98% on RA BMI Readings from Last 3 Encounters:  08/19/21 27.35 kg/m  06/30/21 27.49 kg/m  01/28/21 27.22 kg/m   Wt Readings from Last 3 Encounters:  08/19/21 190 lb 9.6 oz (86.5 kg)  06/30/21 191 lb 9.3 oz (86.9 kg)  01/28/21 189 lb 11.2 oz (86 kg)     CBC    Component Value Date/Time   WBC 16.4 (H) 07/08/2021 0345   RBC 5.14 07/08/2021 0345   HGB 14.4 07/08/2021 0345   HGB 14.7 01/26/2021 1124   HGB 15.7 01/17/2017 1125   HCT 45.5 07/08/2021 0345   HCT 46.5 01/17/2017 1125   PLT 187 07/08/2021 0345   PLT 226 01/26/2021 1124   PLT 202 01/17/2017 1125   MCV 88.5 07/08/2021 0345   MCV 87.1 01/17/2017 1125   MCH 28.0 07/08/2021 0345   MCHC 31.6 07/08/2021 0345   RDW 14.6 07/08/2021 0345   RDW 13.9 01/17/2017 1125   LYMPHSABS 0.9 07/04/2021 0358  LYMPHSABS 1.5 01/17/2017 1125   MONOABS 1.1 (H) 07/04/2021 0358   MONOABS 0.7 01/17/2017 1125   EOSABS 0.0 07/04/2021 0358   EOSABS 0.2 01/17/2017 1125   BASOSABS 0.2 (H) 07/04/2021 0358   BASOSABS 0.1 01/17/2017 1125      Chest Imaging: CTA Chest 06/28/21 reviewed by me remarkable for s/p RUL lobectomy changes, architectural distortion in prox RML, RLL that appears radiation-related, scattered  nodular opacities greatest in LLL. No LAD  Pulmonary Functions Testing Results: No flowsheet data found.  PFTs 2014 pre-lobectomy reviewed by me, normal   Echocardiogram:   TTE 06/30/32 with G2DD, EF 45-50%      Assessment & Plan:   # DOE # Radiation fibrosis # Presumed COPD Likely multifactorial dyspnea on exertion. Does appear at least extravascularly overloaded with BLE edema in setting of his G2DD, likely deconditioned after his hospitalization, but smoking history does put him at risk for COPD. He doesn't have significant burden of radiation fibrosis and although there is bronchiectasis in area that was radiated he does not have productive cough or chest congestion.  Plan: - BMP today, if potassium ok then will start lasix 20 mg daily until follow up with PCP for chronic diastolic heart failure. If swelling not improved or if develops leg pain, redness would recommend getting US DVT.  - repeat CT at 3 mos   - PFTs - declines pulmonary rehab will instead work out with trainer twice weekly - stop dulera, frequently forgets to rinse mouth and doesn't sound like he gets frequent exacerbations if he does have COPD. Start anoro 1 puff daily.      Maryjane Hurter, MD [facility] 08/19/2021 11:17 AM

## 2021-08-19 ENCOUNTER — Ambulatory Visit: Payer: Federal, State, Local not specified - PPO | Admitting: Student

## 2021-08-19 ENCOUNTER — Other Ambulatory Visit: Payer: Self-pay

## 2021-08-19 ENCOUNTER — Encounter: Payer: Self-pay | Admitting: Student

## 2021-08-19 VITALS — BP 126/68 | HR 109 | Temp 97.8°F | Ht 70.0 in | Wt 190.6 lb

## 2021-08-19 DIAGNOSIS — R918 Other nonspecific abnormal finding of lung field: Secondary | ICD-10-CM

## 2021-08-19 DIAGNOSIS — R0609 Other forms of dyspnea: Secondary | ICD-10-CM

## 2021-08-19 DIAGNOSIS — J441 Chronic obstructive pulmonary disease with (acute) exacerbation: Secondary | ICD-10-CM

## 2021-08-19 LAB — BASIC METABOLIC PANEL
BUN: 10 mg/dL (ref 6–23)
CO2: 29 mEq/L (ref 19–32)
Calcium: 8.9 mg/dL (ref 8.4–10.5)
Chloride: 105 mEq/L (ref 96–112)
Creatinine, Ser: 1.03 mg/dL (ref 0.40–1.50)
GFR: 66.75 mL/min (ref >60.00)
Glucose, Bld: 136 mg/dL — ABNORMAL HIGH (ref 70–99)
Potassium: 4.1 mEq/L (ref 3.5–5.1)
Sodium: 139 mEq/L (ref 135–145)

## 2021-08-19 MED ORDER — ANORO ELLIPTA 62.5-25 MCG/ACT IN AEPB: 1.0000 | INHALATION_SPRAY | Freq: Every day | RESPIRATORY_TRACT | 11 refills | Status: DC

## 2021-08-19 NOTE — Patient Instructions (Addendum)
-   Would recommend getting a set of breathing tests - we will schedule at front desk or you will be called to schedule within next 1-2 weeks - would recommend rechecking labs and if potassium level normal then start lasix 20 mg daily at least until you see PCP - restart home exercise with trainer - new inhaler anoro 1 puff once daily - CT Chest in December and follow up visit

## 2021-08-27 ENCOUNTER — Telehealth: Payer: Self-pay | Admitting: Student

## 2021-08-27 NOTE — Telephone Encounter (Signed)
Left detailed message for patient letting him to know that we will call a month ahead time to schedule his Ct

## 2021-08-28 NOTE — Telephone Encounter (Signed)
ATC LVMTCB.  Dr. Verlee Monte BMET has resulted with potassium level. Would you [place] to send lasix? Please advise.

## 2021-08-30 ENCOUNTER — Other Ambulatory Visit: Payer: Self-pay | Admitting: Student

## 2021-08-30 MED ORDER — FUROSEMIDE 20 MG PO TABS: 20.0000 mg | ORAL_TABLET | Freq: Every day | ORAL | 1 refills | Status: DC

## 2021-08-30 NOTE — Telephone Encounter (Signed)
He should weigh himself daily. He should start Lasix 20 mg daily until lower extremity swelling improved, 10 lb weight loss, or development of lightheadedness. He should have a BMP 1 week after starting - might be best to ask that his PCP do this or cardiology to oversee management of diastolic heart failure. If they can't arrange for it then we can order it to make sure his electrolytes/kidney function are ok after starting.  I sent prescription for lasix to [facility] in [place].  Thanks!!

## 2021-09-07 NOTE — Telephone Encounter (Signed)
Spoke with Alvin Hardy and reviewed original Dr. Verlee Monte recommendations including Lasix. Placed orders for BMP. Alvin Hardy request Lasix went to [place]. Alvin Hardy instructed to come into office next week to have blood work drawn. Alvin Hardy stated understanding. Nothing further needed at this time.  Dr. Verlee Monte I saw that Lasix order was D/C'd. Alvin Hardy states lower extremity edema has improved somewhat with compression stocking. Would you still like to order the Lasix? Please advise.

## 2021-09-08 NOTE — Telephone Encounter (Signed)
Patient states needs to cancel the prescription for Lasix. Patient getting med thru primary care doctor. Patient phone number is [phone].

## 2021-09-08 NOTE — Telephone Encounter (Signed)
Noted.  Will close encounter.  

## 2021-09-21 ENCOUNTER — Other Ambulatory Visit (INDEPENDENT_AMBULATORY_CARE_PROVIDER_SITE_OTHER): Payer: Federal, State, Local not specified - PPO

## 2021-09-21 ENCOUNTER — Other Ambulatory Visit: Payer: Self-pay | Admitting: Student

## 2021-09-21 DIAGNOSIS — R0609 Other forms of dyspnea: Secondary | ICD-10-CM

## 2021-09-21 LAB — BASIC METABOLIC PANEL
BUN: 13 mg/dL (ref 6–23)
CO2: 32 mEq/L (ref 19–32)
Calcium: 9.4 mg/dL (ref 8.4–10.5)
Chloride: 103 mEq/L (ref 96–112)
Creatinine, Ser: 1.12 mg/dL (ref 0.40–1.50)
GFR: 60.33 mL/min (ref >60.00)
Glucose, Bld: 83 mg/dL (ref 70–99)
Potassium: 4.4 mEq/L (ref 3.5–5.1)
Sodium: 140 mEq/L (ref 135–145)

## 2021-09-21 NOTE — Progress Notes (Signed)
Needed updated lab order and it was placed.

## 2021-09-28 ENCOUNTER — Telehealth: Payer: Self-pay | Admitting: Student

## 2021-09-28 NOTE — Telephone Encounter (Signed)
Patient is wondering about his lab results.  Dr. Verlee Monte please advise

## 2021-10-01 NOTE — Telephone Encounter (Signed)
Everything's stable - nothing out of the ordinary including potassium. His CO2 (bicarb) is a little high but that is to be expected as a result of lasix use.

## 2021-10-01 NOTE — Telephone Encounter (Signed)
I called the patient and gave him the results and he did not have any other questions.

## 2021-10-07 ENCOUNTER — Ambulatory Visit: Payer: Federal, State, Local not specified - PPO | Admitting: Cardiovascular Disease

## 2021-10-07 ENCOUNTER — Encounter: Payer: Self-pay | Admitting: Cardiovascular Disease

## 2021-10-07 ENCOUNTER — Other Ambulatory Visit: Payer: Self-pay

## 2021-10-07 DIAGNOSIS — R0603 Acute respiratory distress: Secondary | ICD-10-CM | POA: Diagnosis not present

## 2021-10-07 DIAGNOSIS — R600 Localized edema: Secondary | ICD-10-CM | POA: Insufficient documentation

## 2021-10-07 DIAGNOSIS — R079 Chest pain, unspecified: Secondary | ICD-10-CM | POA: Diagnosis not present

## 2021-10-07 DIAGNOSIS — I10 Essential (primary) hypertension: Secondary | ICD-10-CM | POA: Diagnosis not present

## 2021-10-07 DIAGNOSIS — E782 Mixed hyperlipidemia: Secondary | ICD-10-CM | POA: Diagnosis not present

## 2021-10-07 NOTE — Patient Instructions (Signed)
Medication Instructions:  Your physician recommends that you continue on your current medications as directed. Please refer to the Current Medication list given to you today.  *If you need a refill on your cardiac medications before your next appointment, please call your pharmacy*   Testing/Procedures: Your physician has requested that you have an echocardiogram. Echocardiography is a painless test that uses sound waves to create images of your heart. It provides your doctor with information about the size and shape of your heart and how well your heart's chambers and valves are working. This procedure takes approximately one hour. There are no restrictions for this procedure.  To be done in January 2023.  This procedure is done at [address].    Follow-Up: At [facility], you and your health needs are our priority.  As part of our continuing mission to provide you with exceptional heart care, we have created designated Provider Care Teams.  These Care Teams include your primary Cardiologist (physician) and Advanced Practice Providers (APPs -  Physician Assistants and Nurse Practitioners) who all work together to provide you with the care you need, when you need it.  We recommend signing up for the patient portal called "MyChart".  Sign up information is provided on this After Visit Summary.  MyChart is used to connect with patients for Virtual Visits (Telemedicine).  Patients are able to view lab/test results, encounter notes, upcoming appointments, etc.  Non-urgent messages can be sent to your provider as well.   To learn more about what you can do with MyChart, go to [email or web address].    Your next appointment:   6 month(s)  The format for your next appointment:   In Person  Provider:  Quay Burow, MD

## 2021-10-07 NOTE — Assessment & Plan Note (Signed)
History of essential hypertension blood pressure measured today at 138/68.  He is on losartan.

## 2021-10-07 NOTE — Progress Notes (Signed)
10/07/2021 Alvin Hardy   1935-08-09  [id number]  Primary Physician Crist Infante, MD Primary Cardiologist: Lorretta Harp MD Lupe Carney, [place]  HPI:  Alvin Hardy is a 86 y.o.  fit-appearing married Caucasian male father of 2 children, grandfather of 2 grandchildren referred by Dr. Crist Infante for cardiovascular evaluation.  I last saw him in the office 03/27/2015.  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 His cardiovascular risk factor profile is markable for hyperlipidemia intolerant to statin drugs and family history of heart disease with a father who had a myocardial infarction at age 41. He is nonhypertensive, diabetic nor has she smoked in the in the past. He has never had a heart attack or stroke. He was getting occasional chest pain that occurred both with exertion as well as at rest and after eating. He has had lung cancer and has had a VATS procedure by Dr. Servando Snare of the right upper lobe along with chemotherapy and radiation therapy. He has seen a gastroenterologist and has had EGD and is going to be referred to Dr. Malvin Johns for pulmonary evaluation. A Myoview stress test was performed that was entirely normal as were her lower extremity Doppler studies. Because of the statin intolerance he was recently begun on Repatha by Dr. Joylene Draft the marked reduction in his cholesterol levels. Over the last 6 weeks or so he's had progressive increase in frequency and severity of exertional chest pressure as well as dyspnea on exertion.  He did have a 2D echo and Myoview stress test which were essentially normal.  The chest pain which he was experiencing when I saw him last has resolved.  He was hospitalized for pneumonia from the end of August to the end of September (5 weeks.  He went to [facility] for several weeks and now is home.  He has noticed some left lower extremities edema on furosemide.  A 2D echocardiogram performed 06/30/2021 revealed a slight decline in LV function with an EF  of 45 to 50% with grade 2 diastolic dysfunction.  There are no valvular abnormalities noted.   Current Meds  Medication Sig   albuterol (VENTOLIN HFA) 108 (90 Base) MCG/ACT inhaler Inhale 2 puffs into the lungs every 4 (four) hours as needed for shortness of breath.   aspirin EC 81 MG tablet Take 81 mg by mouth every morning.    Calcium Carbonate-Vitamin D 600-400 MG-UNIT tablet Take 1 tablet by mouth every morning.   Cholecalciferol (VITAMIN D-3) 1000 UNITS CAPS Take 1,000 Units by mouth every morning.    clonazePAM (KLONOPIN) 0.5 MG tablet Take 1 tablet (0.5 mg total) by mouth at bedtime.   cyanocobalamin 1000 MCG tablet Take 1,000 mcg by mouth every morning.    DULERA 100-5 MCG/ACT AERO Inhale 2 puffs into the lungs 2 (two) times daily.   esomeprazole (NEXIUM) 40 MG capsule Take 40 mg by mouth daily before breakfast.   finasteride (PROSCAR) 5 MG tablet Take 5 mg by mouth daily before breakfast.    furosemide (LASIX) 20 MG tablet TAKE 1 TABLET(20 MG) BY MOUTH DAILY   gabapentin (NEURONTIN) 300 MG capsule Take 1,200 mg by mouth at bedtime. He takes two capsules every morning and three capsules at bedtime.   lithium carbonate (LITHOBID) 300 MG CR tablet Take 600 mg by mouth at bedtime.    losartan (COZAAR) 50 MG tablet Take 1 tablet (50 mg total) by mouth daily.   Multiple Vitamin (MULTIVITAMIN WITH MINERALS) TABS tablet Take  1 tablet by mouth every morning.   OVER THE COUNTER MEDICATION Take 1 tablet by mouth 2 (two) times daily. Glucosamine Chondroitin 1500mg /1200mg    potassium chloride (KLOR-CON) 10 MEQ tablet Take 10 mEq by mouth daily.   Probiotic Product (PROBIOTIC DAILY PO) Take 1 each by mouth daily.   propranolol (INDERAL) 10 MG tablet Take 10-20 mg by mouth daily as needed (For extremely stressful circumstances.).    REPATHA SURECLICK 532 MG/ML SOAJ Inject 140 mg as directed every 14 (fourteen) days.   tamsulosin (FLOMAX) 0.4 MG CAPS capsule Take 0.4 mg by mouth daily.    testosterone cypionate (DEPOTESTOTERONE CYPIONATE) 200 MG/ML injection Inject 0.5 mLs into the muscle every 21 ( twenty-one) days. Now 300mg /ml   umeclidinium-vilanterol (ANORO ELLIPTA) 62.5-25 MCG/ACT AEPB Inhale 1 puff into the lungs daily.   vardenafil (LEVITRA) 5 MG tablet Take 5 mg by mouth daily as needed for erectile dysfunction.    [DISCONTINUED] clobetasol cream (TEMOVATE) 9.92 % Apply 1 application topically daily. Reported on 01/27/2016   [DISCONTINUED] predniSONE (DELTASONE) 10 MG tablet Take 4 tabs for 3 days, then 3 tabs for 3 days, then 2 tabs for 3 days, then 1 tab for 3 days, then 1/2 tab for 4 days.     Allergies  Allergen Reactions   Codeine Anaphylaxis   Hydrocodone-Acetaminophen     Other reaction(s): Other (See Comments) Reactions unknown   Omeprazole Magnesium     Other reaction(s): Other (See Comments) Reaction Unknown   Tramadol     Other reaction(s): Other (See Comments) Reactions Unknown   Decadron [Dexamethasone]     Pt says it is contraindication due to the lithium he is taking   Fenofibrate     Other reaction(s): Other (See Comments) Reactions Unknown   Neomycin-Bacitracin Zn-Polymyx     Other reaction(s): Other (See Comments) Reactions Unknown   Other     Pt. Remarks that all pain med. Make him nauseated    Penicillin G     Other reaction(s): Other (See Comments) Reactions Unknown   Propoxyphene     Other reaction(s): Other (See Comments) Reaction Unknown   Statins     Lipitor, Crestor, Zocor all caused muscle aches   Ubidecarenone     Other reaction(s): Other (See Comments) Reactions Unknown   Zetia [Ezetimibe]     Muscle aches   Zoloft [Sertraline Hcl] Other (See Comments)    REACTION:  unknown    Social History   Socioeconomic History   Marital status: Married    Spouse name: Not on file   Number of children: Not on file   Years of education: Not on file   Highest education level: Not on file  Occupational History   Not on file   Tobacco Use   Smoking status: Former    Types: Cigarettes    Quit date: 11/01/1978    Years since quitting: 42.9   Smokeless tobacco: Never  Substance and Sexual Activity   Alcohol use: Yes    Comment: occas.   Drug use: No   Sexual activity: Not on file  Other Topics Concern   Not on file  Social History Narrative   Not on file   Social Determinants of Health   Financial Resource Strain: Not on file  Food Insecurity: Not on file  Transportation Needs: Not on file  Physical Activity: Not on file  Stress: Not on file  Social Connections: Not on file  Intimate Partner Violence: Not on file     Review  of Systems: General: negative for chills, fever, night sweats or weight changes.  Cardiovascular: negative for chest pain, dyspnea on exertion, edema, orthopnea, palpitations, paroxysmal nocturnal dyspnea or shortness of breath Dermatological: negative for rash Respiratory: negative for cough or wheezing Urologic: negative for hematuria Abdominal: negative for nausea, vomiting, diarrhea, bright red blood per rectum, melena, or hematemesis Neurologic: negative for visual changes, syncope, or dizziness All other systems reviewed and are otherwise negative except as noted above.    Blood pressure 138/68, pulse (!) 113, height 5\' 10"  (1.778 m), weight 190 lb 6.4 oz (86.4 kg), SpO2 97 %.  General appearance: alert and no distress Neck: no adenopathy, no carotid bruit, no JVD, supple, symmetrical, trachea midline, and thyroid not enlarged, symmetric, no tenderness/mass/nodules Lungs: clear to auscultation bilaterally Heart: regular rate and rhythm, S1, S2 normal, no murmur, click, rub or gallop Extremities: 1+ left lower extremity edema Pulses: 2+ and symmetric Skin: Skin color, texture, turgor normal. No rashes or lesions Neurologic: Grossly normal  EKG sinus tachycardia 113 with right bundle branch block.  I personally reviewed this EKG.  ASSESSMENT AND PLAN:    Hyperlipidemia History of hyperlipidemia on Repatha with lipid profile performed 08/20/2021 revealing total cholesterol of 146, LDL 73 and HDL 44.  Chest pain History of chest pain in the past with a low risk Myoview  Dyspnea History of dyspnea which is probably multifactorial.  He has had lung cancer and a VATS procedure by Dr. Servando Snare with a left upper lobe lobectomy.  Essential hypertension History of essential hypertension blood pressure measured today at 138/68.  He is on losartan.  Lower extremity edema History of lower extreme edema principally on the left since he has been discharged from the hospital back in September.  He had a prolonged hospitalization less than 5 weeks for pneumonia.  He did have a 2D echo performed 06/30/2021 that showed an EF of 45 to 50% with grade 2 diastolic dysfunction.  There are no valvular abnormalities noted.  He had a normal echo in the past.  He is on furosemide.  We will recheck an echo next month.     Lorretta Harp MD FACP,FACC,FAHA, [facility] 10/07/2021 4:41 PM

## 2021-10-07 NOTE — Assessment & Plan Note (Signed)
History of lower extreme edema principally on the left since he has been discharged from the hospital back in September.  He had a prolonged hospitalization less than 5 weeks for pneumonia.  He did have a 2D echo performed 06/30/2021 that showed an EF of 45 to 50% with grade 2 diastolic dysfunction.  There are no valvular abnormalities noted.  He had a normal echo in the past.  He is on furosemide.  We will recheck an echo next month.

## 2021-10-07 NOTE — Assessment & Plan Note (Signed)
History of dyspnea which is probably multifactorial.  He has had lung cancer and a VATS procedure by Dr. Servando Snare with a left upper lobe lobectomy.

## 2021-10-07 NOTE — Assessment & Plan Note (Signed)
History of chest pain in the past with a low risk Myoview

## 2021-10-07 NOTE — Assessment & Plan Note (Signed)
History of hyperlipidemia on Repatha with lipid profile performed 08/20/2021 revealing total cholesterol of 146, LDL 73 and HDL 44.

## 2021-10-12 ENCOUNTER — Ambulatory Visit (HOSPITAL_COMMUNITY)
Admission: RE | Admit: 2021-10-12 | Discharge: 2021-10-12 | Disposition: A | Discharge status: Home / Self Care | Payer: Federal, State, Local not specified - PPO | Source: Ambulatory Visit | Attending: Student | Admitting: Student

## 2021-10-12 ENCOUNTER — Other Ambulatory Visit: Payer: Self-pay

## 2021-10-12 ENCOUNTER — Telehealth: Payer: Self-pay | Admitting: Student

## 2021-10-12 DIAGNOSIS — R918 Other nonspecific abnormal finding of lung field: Secondary | ICD-10-CM | POA: Diagnosis present

## 2021-10-12 NOTE — Telephone Encounter (Signed)
Call made to patient, confirmed DOB. Patient wants to know whether or not he can fly on a airplane. He is requesting to make his appt for 12/14 changed to a Video visit due to being positive for covid. He is going to get on airplane on the 27th. Patient states he is concerned about residual effects of Pneumonia and whether or not it is still showing on this most recent CT.   Appt 12/14 changed to mychart video visit. I made pt aware this can all be addressed at time of visit 12/14. Voiced understanding.   NM just FYI. Patient wants to discuss CT and whether or not he can fly at the end of this month.   Nothing further needed at this time.

## 2021-10-14 ENCOUNTER — Ambulatory Visit: Payer: Federal, State, Local not specified - PPO | Admitting: Student

## 2021-10-14 ENCOUNTER — Other Ambulatory Visit: Payer: Self-pay

## 2021-10-14 ENCOUNTER — Telehealth (INDEPENDENT_AMBULATORY_CARE_PROVIDER_SITE_OTHER): Payer: Federal, State, Local not specified - PPO | Admitting: Student

## 2021-10-14 ENCOUNTER — Encounter: Payer: Self-pay | Admitting: Student

## 2021-10-14 VITALS — Ht 70.0 in | Wt 187.0 lb

## 2021-10-14 DIAGNOSIS — J701 Chronic and other pulmonary manifestations due to radiation: Secondary | ICD-10-CM | POA: Diagnosis not present

## 2021-10-14 DIAGNOSIS — R0609 Other forms of dyspnea: Secondary | ICD-10-CM

## 2021-10-14 NOTE — Progress Notes (Addendum)
Synopsis: Referred for COPD by Crist Infante, MD  Virtual Visit via Video Note   I connected with Alvin Hardy today by a video enabled telemedicine application and verified that I am speaking with the correct person using two identifiers.   Location: Patient: Home Provider: Office   I discussed the limitations of evaluation and management by telemedicine and the availability of in person appointments. The patient expressed understanding and agreed to proceed.  Subjective:   PATIENT ID: Alvin Hardy GENDER: male DOB: [birth year], MRN: [id number]  Chief Complaint  Patient presents with   Follow-up    Pt states results for CXR , tested positive for COVID on 10/12/21   86yM with COPD previously followed by Dr. Lamonte Sakai, stage IIIa RUL adenoCA s/p lobectomy, chemoXRT, psoriatic arthritis previously on methotrexate 2016, remote smoking pipes mostly - quit in 1995 30 years or so, radiation pneumonitis in 2014, GERD, LPR  06/28/21 had admission to [facility] for multifocal pneumonia, AECOPD requiring intubation and ICU admission, course c/b encephalopathy that cleared, urinary retention. Discharged to SNF. Has been home for a couple weeks.   He is taking dulera 2 puffs twice daily, forgets to rinse his mouth. He had some DOE prior to 8/28 admission. At this point he feels like tires easily - not necessarily due to dyspnea. He says walking he feels more limited by leg weakness. He has no substantial cough productive or otherwise. No CP. He doesn't think he has any overt issues with solid or liquid food dysphagia/aspiration - speech eval was ok after encephalopathy cleared.   He has no family history of lung disease.  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  Interval HPI:  Feeling better overall up until he came down with covid-19 a couple days ago but even after coming down with covid he's still felt well overall, taking paxlovid. Taking lasix  intermittently has improved his LE swelling and he feels better able to walk around. No worsening DOE, cough. Has upcoming trip to [place] [facility] with family.    Otherwise pertinent review of systems is negative.     Past Medical History:  Diagnosis Date   Allergy    Arthritis    psoriatic arthritis, ? , treated /w mmethotrexate    Bipolar 1 disorder ([place])    BPH (benign prostatic hyperplasia)    Cataract 2010   Bilateral   Chest pain    Depression    Deviated septum    TO THE LEFT   ED (erectile dysfunction)    Essential hypertension 06/28/2021   Finger injury    mallet finger-- 02/13/2013, cast in place, followed by dr. Fredna Dow   GERD (gastroesophageal reflux disease)    Gilbert's syndrome    History of radiation therapy 07/24/13-08/27/13   50Gy/25 fx chest   Hyperlipidemia    Hypogonadism male    Laryngopharyngeal reflux    lung ca dx'd 12/2012   rul   Lung mass 02/07/13   RIGHT UPPER LOBE   Morton's neuroma    LEFT FOOT   Neuromuscular disorder ([place])    peripheral neuropathy   Shortness of breath    voice breaks, ? related to reflux, although told by Dr. Ardis Hughs, S.- early 73 yr. old     Family History  Problem Relation Age of Onset   Diabetes Mother    Cancer Mother        abdomen   Heart disease Father    Diabetes Son  Cancer Maternal Aunt        lung ca, smoker     Past Surgical History:  Procedure Laterality Date   CYSTOSCOPY N/A 02/21/2013   Procedure: CYSTOSCOPY FLEXIBLE with insertion of foley catheter;  Surgeon: Fredricka Bonine, MD;  Location: [facility];  Service: Urology;  Laterality: N/A;   EYE SURGERY     cataracts removed fr. both eyes, IOL in place    KNEE ARTHROSCOPY Right 01/2007   PILONIDAL CYST EXCISION  1960's   PROSTATE SURGERY  1996   TURP   VIDEO ASSISTED THORACOSCOPY (VATS)/WEDGE RESECTION Right 02/21/2013   Procedure: Right VIDEO ASSISTED THORACOSCOPY ,Thoracotomy with right upper lobectomy, node sampling ;  Surgeon: Grace Isaac,  MD;  Location: [facility];  Service: Thoracic;  Laterality: Right;   VIDEO BRONCHOSCOPY N/A 02/21/2013   Procedure: VIDEO BRONCHOSCOPY;  Surgeon: Grace Isaac, MD;  Location: [facility] OR;  Service: Thoracic;  Laterality: N/A;    Social History   Socioeconomic History   Marital status: Married    Spouse name: Not on file   Number of children: Not on file   Years of education: Not on file   Highest education level: Not on file  Occupational History   Not on file  Tobacco Use   Smoking status: Former    Types: Cigarettes    Quit date: 11/01/1978    Years since quitting: 42.9   Smokeless tobacco: Never  Substance and Sexual Activity   Alcohol use: Yes    Comment: occas.   Drug use: No   Sexual activity: Not on file  Other Topics Concern   Not on file  Social History Narrative   Not on file   Social Determinants of Health   Financial Resource Strain: Not on file  Food Insecurity: Not on file  Transportation Needs: Not on file  Physical Activity: Not on file  Stress: Not on file  Social Connections: Not on file  Intimate Partner Violence: Not on file     Allergies  Allergen Reactions   Codeine Anaphylaxis   Hydrocodone-Acetaminophen     Other reaction(s): Other (See Comments) Reactions unknown   Omeprazole Magnesium     Other reaction(s): Other (See Comments) Reaction Unknown   Tramadol     Other reaction(s): Other (See Comments) Reactions Unknown   Decadron [Dexamethasone]     Pt says it is contraindication due to the lithium he is taking   Fenofibrate     Other reaction(s): Other (See Comments) Reactions Unknown   Neomycin-Bacitracin Zn-Polymyx     Other reaction(s): Other (See Comments) Reactions Unknown   Other     Pt. Remarks that all pain med. Make him nauseated    Penicillin G     Other reaction(s): Other (See Comments) Reactions Unknown   Propoxyphene     Other reaction(s): Other (See Comments) Reaction Unknown   Statins     Lipitor, Crestor, Zocor all  caused muscle aches   Ubidecarenone     Other reaction(s): Other (See Comments) Reactions Unknown   Zetia [Ezetimibe]     Muscle aches   Zoloft [Sertraline Hcl] Other (See Comments)    REACTION:  unknown     Outpatient Medications Prior to Visit  Medication Sig Dispense Refill   albuterol (VENTOLIN HFA) 108 (90 Base) MCG/ACT inhaler Inhale 2 puffs into the lungs every 4 (four) hours as needed for shortness of breath.     aspirin EC 81 MG tablet Take 81 mg by mouth every  morning.      Calcium Carbonate-Vitamin D 600-400 MG-UNIT tablet Take 1 tablet by mouth every morning.     Cholecalciferol (VITAMIN D-3) 1000 UNITS CAPS Take 1,000 Units by mouth every morning.      clonazePAM (KLONOPIN) 0.5 MG tablet Take 1 tablet (0.5 mg total) by mouth at bedtime. 30 tablet 0   cyanocobalamin 1000 MCG tablet Take 1,000 mcg by mouth every morning.      DULERA 100-5 MCG/ACT AERO Inhale 2 puffs into the lungs 2 (two) times daily.     esomeprazole (NEXIUM) 40 MG capsule Take 40 mg by mouth daily before breakfast.     finasteride (PROSCAR) 5 MG tablet Take 5 mg by mouth daily before breakfast.      furosemide (LASIX) 20 MG tablet TAKE 1 TABLET(20 MG) BY MOUTH DAILY 90 tablet 0   gabapentin (NEURONTIN) 300 MG capsule Take 1,200 mg by mouth at bedtime. He takes two capsules every morning and three capsules at bedtime.     lithium carbonate (LITHOBID) 300 MG CR tablet Take 600 mg by mouth at bedtime.      losartan (COZAAR) 50 MG tablet Take 1 tablet (50 mg total) by mouth daily. 30 tablet 2   Multiple Vitamin (MULTIVITAMIN WITH MINERALS) TABS tablet Take 1 tablet by mouth every morning.     OVER THE COUNTER MEDICATION Take 1 tablet by mouth 2 (two) times daily. Glucosamine Chondroitin 1500mg /1200mg      potassium chloride (KLOR-CON) 10 MEQ tablet Take 10 mEq by mouth daily.     Probiotic Product (PROBIOTIC DAILY PO) Take 1 each by mouth daily.     propranolol (INDERAL) 10 MG tablet Take 10-20 mg by mouth daily  as needed (For extremely stressful circumstances.).      REPATHA SURECLICK 993 MG/ML SOAJ Inject 140 mg as directed every 14 (fourteen) days.     tamsulosin (FLOMAX) 0.4 MG CAPS capsule Take 0.4 mg by mouth daily.  3   testosterone cypionate (DEPOTESTOTERONE CYPIONATE) 200 MG/ML injection Inject 0.5 mLs into the muscle every 21 ( twenty-one) days. Now 300mg /ml  1   umeclidinium-vilanterol (ANORO ELLIPTA) 62.5-25 MCG/ACT AEPB Inhale 1 puff into the lungs daily. 1 each 11   vardenafil (LEVITRA) 5 MG tablet Take 5 mg by mouth daily as needed for erectile dysfunction.      No facility-administered medications prior to visit.       Objective:   Physical Exam:  Neuro: attentive HEENT: voice loud, clear Resp: equal chest rise, normal work of breathing   Vitals:   10/14/21 1202  Weight: 187 lb (84.8 kg)  Height: 5\' 10"  (1.778 m)     on RA BMI Readings from Last 3 Encounters:  10/14/21 26.83 kg/m  10/07/21 27.32 kg/m  08/19/21 27.35 kg/m   Wt Readings from Last 3 Encounters:  10/14/21 187 lb (84.8 kg)  10/07/21 190 lb 6.4 oz (86.4 kg)  08/19/21 190 lb 9.6 oz (86.5 kg)     CBC    Component Value Date/Time   WBC 16.4 (H) 07/08/2021 0345   RBC 5.14 07/08/2021 0345   HGB 14.4 07/08/2021 0345   HGB 14.7 01/26/2021 1124   HGB 15.7 01/17/2017 1125   HCT 45.5 07/08/2021 0345   HCT 46.5 01/17/2017 1125   PLT 187 07/08/2021 0345   PLT 226 01/26/2021 1124   PLT 202 01/17/2017 1125   MCV 88.5 07/08/2021 0345   MCV 87.1 01/17/2017 1125   MCH 28.0 07/08/2021 0345   MCHC 31.6  07/08/2021 0345   RDW 14.6 07/08/2021 0345   RDW 13.9 01/17/2017 1125   LYMPHSABS 0.9 07/04/2021 0358   LYMPHSABS 1.5 01/17/2017 1125   MONOABS 1.1 (H) 07/04/2021 0358   MONOABS 0.7 01/17/2017 1125   EOSABS 0.0 07/04/2021 0358   EOSABS 0.2 01/17/2017 1125   BASOSABS 0.2 (H) 07/04/2021 0358   BASOSABS 0.1 01/17/2017 1125      Chest Imaging: CTA Chest 06/28/21 reviewed by me remarkable for s/p RUL  lobectomy changes, architectural distortion in prox RML, RLL that appears radiation-related, scattered nodular opacities greatest in LLL. No LAD  CT Chest 10/12/21 reviewed by me with resolution of pneumonia, persistent architectural distortion from radiation  Pulmonary Functions Testing Results: No flowsheet data found.  PFTs 2014 pre-lobectomy reviewed by me, normal   Echocardiogram:   TTE 06/30/32 with G2DD, EF 45-50%      Assessment & Plan:   # DOE # Radiation fibrosis # Presumed COPD Likely multifactorial dyspnea on exertion but improved since last visit. Has been at least extravascularly overloaded with BLE edema in setting of his G2DD, likely deconditioned after his hospitalization, but smoking history does put him at risk for COPD. He doesn't have significant burden of radiation fibrosis and although there is bronchiectasis in area that was radiated he does not have productive cough or chest congestion.  Plan: - PFTs next visit - declines pulmonary rehab will instead work out with trainer twice weekly - continue anoro 1 puff daily     Maryjane Hurter, MD [facility] 10/14/2021 6:25 PM

## 2021-10-14 NOTE — Patient Instructions (Signed)
-   anoro 1 puff once daily  -

## 2021-11-23 ENCOUNTER — Ambulatory Visit (HOSPITAL_COMMUNITY): Payer: Federal, State, Local not specified - PPO | Attending: Internal Medicine

## 2021-11-23 ENCOUNTER — Other Ambulatory Visit: Payer: Self-pay

## 2021-11-23 DIAGNOSIS — R079 Chest pain, unspecified: Secondary | ICD-10-CM | POA: Diagnosis not present

## 2021-11-23 DIAGNOSIS — R600 Localized edema: Secondary | ICD-10-CM | POA: Insufficient documentation

## 2021-11-23 DIAGNOSIS — E782 Mixed hyperlipidemia: Secondary | ICD-10-CM | POA: Insufficient documentation

## 2021-11-23 DIAGNOSIS — I10 Essential (primary) hypertension: Secondary | ICD-10-CM | POA: Diagnosis not present

## 2021-11-23 LAB — ECHOCARDIOGRAM COMPLETE
Area-P 1/2: 4.49 cm2
S' Lateral: 3.2 cm

## 2021-11-30 ENCOUNTER — Encounter: Payer: Self-pay | Admitting: Podiatry

## 2021-11-30 ENCOUNTER — Other Ambulatory Visit: Payer: Self-pay

## 2021-11-30 ENCOUNTER — Ambulatory Visit (INDEPENDENT_AMBULATORY_CARE_PROVIDER_SITE_OTHER): Payer: Federal, State, Local not specified - PPO

## 2021-11-30 ENCOUNTER — Ambulatory Visit (INDEPENDENT_AMBULATORY_CARE_PROVIDER_SITE_OTHER): Payer: Federal, State, Local not specified - PPO | Admitting: Podiatry

## 2021-11-30 DIAGNOSIS — L97521 Non-pressure chronic ulcer of other part of left foot limited to breakdown of skin: Secondary | ICD-10-CM | POA: Diagnosis not present

## 2021-11-30 NOTE — Addendum Note (Signed)
Addended by: Lorenda Peck R on: 11/30/2021 02:46 PM   Modules accepted: Orders

## 2021-11-30 NOTE — Progress Notes (Signed)
°  Subjective:  Patient ID: Alvin Hardy, male    DOB: 11-13-1934,   MRN: [id number]  Chief Complaint  Patient presents with   Foot Ulcer    Pt is here due to a possible ulcer on the side of 5th toe x 3 months. Pain when it rubs against something      87 y.o. male presents for concern of ulcer of left fifth digit that has been present for several months and ha snot healed. Relates bleeding. Has been applying neosporin and bandaid. Relates pain and swelling in the foot. Currently on doxycycline.  Not diabetic. Denies any other pedal complaints. Denies n/v/f/c.   Past Medical History:  Diagnosis Date   Allergy    Arthritis    psoriatic arthritis, ? , treated /w mmethotrexate    Bipolar 1 disorder ([place])    BPH (benign prostatic hyperplasia)    Cataract 2010   Bilateral   Chest pain    Depression    Deviated septum    TO THE LEFT   ED (erectile dysfunction)    Essential hypertension 06/28/2021   Finger injury    mallet finger-- 02/13/2013, cast in place, followed by dr. Fredna Dow   GERD (gastroesophageal reflux disease)    Gilbert's syndrome    History of radiation therapy 07/24/13-08/27/13   50Gy/25 fx chest   Hyperlipidemia    Hypogonadism male    Laryngopharyngeal reflux    lung ca dx'd 12/2012   rul   Lung mass 02/07/13   RIGHT UPPER LOBE   Morton's neuroma    LEFT FOOT   Neuromuscular disorder ([place])    peripheral neuropathy   Shortness of breath    voice breaks, ? related to reflux, although told by Dr. Ardis Hughs, S.- early 31 yr. old    Objective:  Physical Exam: Vascular: DP/PT pulses 2/4 bilateral. CFT <3 seconds. Normal hair growth on digits. No edema.  Skin. No lacerations or abrasions bilateral feet. Hyperkeratotic lesion medial fifth digit upon debridement 0.1 cm x 0.1 cmx 0.1 cm wound with granular base.  Musculoskeletal: MMT 5/5 bilateral lower extremities in DF, PF, Inversion and Eversion. Deceased ROM in DF of ankle joint.  Neurological: Sensation intact to light  touch.   Assessment:   1. Skin ulcer of toe of left foot, limited to breakdown of skin ([place])      Plan:  Patient was evaluated and treated and all questions answered. Ulcer left fifth digit limited to breakdown of skin  -Debridement as below. -Dressed with betadine, DSD. -Off-loading with surgical shoe. -Continue doxycycline -Discussed glucose control and proper protein-rich diet.  -Discussed if any worsening redness, pain, fever or chills to call or may need to report to the emergency room. Patient expressed understanding.   Procedure: Excisional Debridement of Wound Rationale: Removal of non-viable soft tissue from the wound to promote healing.  Anesthesia: none Pre-Debridement Wound Measurements: Overlying callus Post-Debridement Wound Measurements: 0.1 cm x 0.1 cm x 0.1 cm  Type of Debridement: Sharp Excisional Tissue Removed: Non-viable soft tissue Depth of Debridement: subcutaneous tissue. Technique: Sharp excisional debridement to bleeding, viable wound base.  Dressing: Dry, sterile, compression dressing. Disposition: Patient tolerated procedure well. Patient to return in 2 week for follow-up.  No follow-ups on file.   Lorenda Peck, DPM

## 2021-12-01 ENCOUNTER — Ambulatory Visit: Payer: Federal, State, Local not specified - PPO | Admitting: Student

## 2021-12-01 ENCOUNTER — Encounter: Payer: Self-pay | Admitting: Student

## 2021-12-01 ENCOUNTER — Telehealth: Payer: Self-pay | Admitting: *Deleted

## 2021-12-01 VITALS — BP 116/72 | HR 96 | Temp 98.0°F | Ht 70.0 in | Wt 184.8 lb

## 2021-12-01 DIAGNOSIS — J701 Chronic and other pulmonary manifestations due to radiation: Secondary | ICD-10-CM

## 2021-12-01 DIAGNOSIS — R0609 Other forms of dyspnea: Secondary | ICD-10-CM | POA: Diagnosis not present

## 2021-12-01 NOTE — Telephone Encounter (Signed)
Called patient - informed him the large surgical shoes had arrived in the office and he could come by and pick one up. It will be waiting at the front desk.

## 2021-12-01 NOTE — Patient Instructions (Addendum)
-   Breathing tests in 9 weeks and clinic appointment in 9 weeks on same day - Albuterol 1-2 puffs as needed or 30 minutes before exercise

## 2021-12-01 NOTE — Progress Notes (Signed)
Synopsis: Referred for COPD by Crist Infante, MD  Subjective:   PATIENT ID: Alvin Hardy GENDER: male DOB: 11/19/1934, MRN: [id number]  Chief Complaint  Patient presents with   Follow-up    He has been using the Anoro but feels this has not helped. He has not been using his rescue inhaler.    87yM with COPD previously followed by Dr. Lamonte Sakai, stage IIIa RUL adenoCA s/p lobectomy, chemoXRT, psoriatic arthritis previously on methotrexate 2016, remote smoking pipes mostly - quit in 1995 30 years or so, radiation pneumonitis in 2014, GERD, LPR  06/28/21 had admission to [facility] for multifocal pneumonia, AECOPD requiring intubation and ICU admission, course c/b encephalopathy that cleared, urinary retention. Discharged to SNF. Has been home for a couple weeks.   He is taking dulera 2 puffs twice daily, forgets to rinse his mouth. He had some DOE prior to 8/28 admission. At this point he feels like tires easily - not necessarily due to dyspnea. He says walking he feels more limited by leg weakness. He has no substantial cough productive or otherwise. No CP. He doesn't think he has any overt issues with solid or liquid food dysphagia/aspiration - speech eval was ok after encephalopathy cleared.   He has no family history of lung disease.  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  Interval HPI: Last seen by me 10/14/21 as a video visit after he'd had covid-19 infection, was completing course of paxlovid at the time. Continued anoro and ordered PFTs.   He has stopped using the anoro because he noticed no difference with regard to his DOE. Has not tried using his albuterol rescue.   Sounds like he had trouble with elevation in [facility] with disrupted sleep. Worse trouble with DOE. He does however think he's back to his recent pre-covid-19 baseline.    Otherwise pertinent review of systems is negative.  Past Medical History:  Diagnosis Date    Allergy    Arthritis    psoriatic arthritis, ? , treated /w mmethotrexate    Bipolar 1 disorder ([place])    BPH (benign prostatic hyperplasia)    Cataract 2010   Bilateral   Chest pain    Depression    Deviated septum    TO THE LEFT   ED (erectile dysfunction)    Essential hypertension 06/28/2021   Finger injury    mallet finger-- 02/13/2013, cast in place, followed by dr. Fredna Dow   GERD (gastroesophageal reflux disease)    Gilbert's syndrome    History of radiation therapy 07/24/13-08/27/13   50Gy/25 fx chest   Hyperlipidemia    Hypogonadism male    Laryngopharyngeal reflux    lung ca dx'd 12/2012   rul   Lung mass 02/07/13   RIGHT UPPER LOBE   Morton's neuroma    LEFT FOOT   Neuromuscular disorder ([place])    peripheral neuropathy   Shortness of breath    voice breaks, ? related to reflux, although told by Dr. Ardis Hughs, S.- early 57 yr. old     Family History  Problem Relation Age of Onset   Diabetes Mother    Cancer Mother        abdomen   Heart disease Father    Diabetes Son    Cancer Maternal Aunt        lung ca, smoker     Past Surgical History:  Procedure Laterality Date   CYSTOSCOPY N/A 02/21/2013   Procedure:  CYSTOSCOPY FLEXIBLE with insertion of foley catheter;  Surgeon: Fredricka Bonine, MD;  Location: [place];  Service: Urology;  Laterality: N/A;   EYE SURGERY     cataracts removed fr. both eyes, IOL in place    KNEE ARTHROSCOPY Right 01/2007   PILONIDAL CYST EXCISION  1960's   PROSTATE SURGERY  1996   TURP   VIDEO ASSISTED THORACOSCOPY (VATS)/WEDGE RESECTION Right 02/21/2013   Procedure: Right VIDEO ASSISTED THORACOSCOPY ,Thoracotomy with right upper lobectomy, node sampling ;  Surgeon: Grace Isaac, MD;  Location: [place];  Service: Thoracic;  Laterality: Right;   VIDEO BRONCHOSCOPY N/A 02/21/2013   Procedure: VIDEO BRONCHOSCOPY;  Surgeon: Grace Isaac, MD;  Location: [facility] OR;  Service: Thoracic;  Laterality: N/A;    Social History   Socioeconomic  History   Marital status: Married    Spouse name: Not on file   Number of children: Not on file   Years of education: Not on file   Highest education level: Not on file  Occupational History   Not on file  Tobacco Use   Smoking status: Former    Packs/day: 0.50    Years: 24.00    Pack years: 12.00    Types: Cigarettes, Pipe    Quit date: 11/01/1978    Years since quitting: 43.1   Smokeless tobacco: Never  Substance and Sexual Activity   Alcohol use: Yes    Comment: occas.   Drug use: No   Sexual activity: Not on file  Other Topics Concern   Not on file  Social History Narrative   Not on file   Social Determinants of Health   Financial Resource Strain: Not on file  Food Insecurity: Not on file  Transportation Needs: Not on file  Physical Activity: Not on file  Stress: Not on file  Social Connections: Not on file  Intimate Partner Violence: Not on file     Allergies  Allergen Reactions   Codeine Anaphylaxis   Hydrocodone-Acetaminophen     Other reaction(s): Other (See Comments) Reactions unknown   Omeprazole Magnesium     Other reaction(s): Other (See Comments) Reaction Unknown   Tramadol     Other reaction(s): Other (See Comments) Reactions Unknown   Decadron [Dexamethasone]     Pt says it is contraindication due to the lithium he is taking   Fenofibrate     Other reaction(s): Other (See Comments) Reactions Unknown   Neomycin-Bacitracin Zn-Polymyx     Other reaction(s): Other (See Comments) Reactions Unknown   Other     Pt. Remarks that all pain med. Make him nauseated    Penicillin G     Other reaction(s): Other (See Comments) Reactions Unknown   Propoxyphene     Other reaction(s): Other (See Comments) Reaction Unknown   Statins     Lipitor, Crestor, Zocor all caused muscle aches   Ubidecarenone     Other reaction(s): Other (See Comments) Reactions Unknown   Zetia [Ezetimibe]     Muscle aches   Zoloft [Sertraline Hcl] Other (See Comments)     REACTION:  unknown     Outpatient Medications Prior to Visit  Medication Sig Dispense Refill   albuterol (VENTOLIN HFA) 108 (90 Base) MCG/ACT inhaler Inhale 2 puffs into the lungs every 4 (four) hours as needed for shortness of breath.     aspirin EC 81 MG tablet Take 81 mg by mouth every morning.      Calcium Carbonate-Vitamin D 600-400 MG-UNIT tablet Take 1 tablet  by mouth every morning.     Cholecalciferol (VITAMIN D-3) 1000 UNITS CAPS Take 1,000 Units by mouth every morning.      clonazePAM (KLONOPIN) 0.5 MG tablet Take 1 tablet (0.5 mg total) by mouth at bedtime. 30 tablet 0   cyanocobalamin 1000 MCG tablet Take 1,000 mcg by mouth every morning.      esomeprazole (NEXIUM) 40 MG capsule Take 40 mg by mouth daily before breakfast.     finasteride (PROSCAR) 5 MG tablet Take 5 mg by mouth daily before breakfast.      furosemide (LASIX) 20 MG tablet TAKE 1 TABLET(20 MG) BY MOUTH DAILY 90 tablet 0   gabapentin (NEURONTIN) 300 MG capsule Take 1,200 mg by mouth at bedtime. He takes two capsules every morning and three capsules at bedtime.     lithium carbonate (LITHOBID) 300 MG CR tablet Take 600 mg by mouth at bedtime.      Multiple Vitamin (MULTIVITAMIN WITH MINERALS) TABS tablet Take 1 tablet by mouth every morning.     OVER THE COUNTER MEDICATION Take 1 tablet by mouth 2 (two) times daily. Glucosamine Chondroitin 1500mg /1200mg      potassium chloride (KLOR-CON) 10 MEQ tablet Take 10 mEq by mouth daily.     Probiotic Product (PROBIOTIC DAILY PO) Take 1 each by mouth daily.     REPATHA SURECLICK 102 MG/ML SOAJ Inject 140 mg as directed every 14 (fourteen) days.     tamsulosin (FLOMAX) 0.4 MG CAPS capsule Take 0.4 mg by mouth daily.  3   testosterone cypionate (DEPOTESTOTERONE CYPIONATE) 200 MG/ML injection Inject 0.5 mLs into the muscle every 21 ( twenty-one) days. Now 300mg /ml  1   umeclidinium-vilanterol (ANORO ELLIPTA) 62.5-25 MCG/ACT AEPB Inhale 1 puff into the lungs daily. 1 each 11    DULERA 100-5 MCG/ACT AERO Inhale 2 puffs into the lungs 2 (two) times daily.     losartan (COZAAR) 50 MG tablet Take 1 tablet (50 mg total) by mouth daily. 30 tablet 2   propranolol (INDERAL) 10 MG tablet Take 10-20 mg by mouth daily as needed (For extremely stressful circumstances.).      vardenafil (LEVITRA) 5 MG tablet Take 5 mg by mouth daily as needed for erectile dysfunction.      No facility-administered medications prior to visit.       Objective:   Physical Exam: General appearance: 87 y.o., male, NAD, conversant  Eyes: anicteric sclerae; PERRL, tracking appropriately HENT: NCAT; MMM Neck: Trachea midline; no lymphadenopathy, no JVD Lungs: CTAB, no crackles, no wheeze, with normal respiratory effort CV: RRR, no murmur  Abdomen: Soft, non-tender; non-distended, BS present  Extremities: No peripheral edema, warm Skin: Normal turgor and texture; no rash Psych: Appropriate affect Neuro: Alert and oriented to person and place, no focal deficit     Vitals:   12/01/21 1350  BP: 116/72  Pulse: 96  Temp: 98 F (36.7 C)  TempSrc: Oral  SpO2: 96%  Weight: 184 lb 12.8 oz (83.8 kg)  Height: 5\' 10"  (1.778 m)    96% on RA BMI Readings from Last 3 Encounters:  12/01/21 26.52 kg/m  10/14/21 26.83 kg/m  10/07/21 27.32 kg/m   Wt Readings from Last 3 Encounters:  12/01/21 184 lb 12.8 oz (83.8 kg)  10/14/21 187 lb (84.8 kg)  10/07/21 190 lb 6.4 oz (86.4 kg)     CBC    Component Value Date/Time   WBC 16.4 (H) 07/08/2021 0345   RBC 5.14 07/08/2021 0345   HGB 14.4 07/08/2021 0345  HGB 14.7 01/26/2021 1124   HGB 15.7 01/17/2017 1125   HCT 45.5 07/08/2021 0345   HCT 46.5 01/17/2017 1125   PLT 187 07/08/2021 0345   PLT 226 01/26/2021 1124   PLT 202 01/17/2017 1125   MCV 88.5 07/08/2021 0345   MCV 87.1 01/17/2017 1125   MCH 28.0 07/08/2021 0345   MCHC 31.6 07/08/2021 0345   RDW 14.6 07/08/2021 0345   RDW 13.9 01/17/2017 1125   LYMPHSABS 0.9 07/04/2021 0358    LYMPHSABS 1.5 01/17/2017 1125   MONOABS 1.1 (H) 07/04/2021 0358   MONOABS 0.7 01/17/2017 1125   EOSABS 0.0 07/04/2021 0358   EOSABS 0.2 01/17/2017 1125   BASOSABS 0.2 (H) 07/04/2021 0358   BASOSABS 0.1 01/17/2017 1125      Chest Imaging: CTA Chest 06/28/21 reviewed by me remarkable for s/p RUL lobectomy changes, architectural distortion in prox RML, RLL that appears radiation-related, scattered nodular opacities greatest in LLL. No LAD  CT Chest 10/13/21 reviewed by me with resolution of pneumonia, persistent architectural distortion from radiation  Pulmonary Functions Testing Results: No flowsheet data found.  PFTs 2014 pre-lobectomy reviewed by me, normal   Echocardiogram:   TTE 06/30/32 with G2DD, EF 45-50%      Assessment & Plan:   # DOE # Radiation fibrosis # Presumed COPD Likely multifactorial dyspnea on exertion. Less extravascularly volume overloaded on exam today but diastolic dysfunction could still be playing role. COPD is possible contributor but he hasn't had robust response to anoro. In past took him off dulera since he didn't want to bother rinsing mouth out and to trial LABA/LAMA to avoid the somewhat increased risk he'd face with LABA/ICS for pneumonia. He doesn't have significant burden of radiation fibrosis and although there is bronchiectasis in area that was radiated he does not have productive cough or chest congestion.  Plan: - PFTs - again declines pulmonary rehab will instead work out with trainer twice weekly - stop anoro 1 puff once daily   RTC 8 weeks with PFT and clinic visit after surveillance CT Chest ordered by Dr. Julien Nordmann  I spent 36 minutes dedicated to the care of this patient on the date of this encounter to include pre-visit review of records, face-to-face time with the patient discussing conditions above, post visit ordering of testing, clinical documentation with the electronic health record, making appropriate referrals as documented,  and communicating necessary findings to members of the patients care team.     Maryjane Hurter, MD [facility] 12/01/2021 2:06 PM

## 2021-12-02 ENCOUNTER — Other Ambulatory Visit: Payer: Self-pay

## 2021-12-02 ENCOUNTER — Ambulatory Visit
Admission: RE | Admit: 2021-12-02 | Discharge: 2021-12-02 | Disposition: A | Discharge status: Home / Self Care | Payer: Federal, State, Local not specified - PPO | Source: Ambulatory Visit | Attending: Podiatry | Admitting: Podiatry

## 2021-12-02 DIAGNOSIS — L97521 Non-pressure chronic ulcer of other part of left foot limited to breakdown of skin: Secondary | ICD-10-CM

## 2021-12-04 ENCOUNTER — Encounter: Payer: Self-pay | Admitting: Podiatry

## 2021-12-04 ENCOUNTER — Ambulatory Visit (INDEPENDENT_AMBULATORY_CARE_PROVIDER_SITE_OTHER): Payer: Federal, State, Local not specified - PPO | Admitting: Podiatry

## 2021-12-04 ENCOUNTER — Other Ambulatory Visit: Payer: Self-pay

## 2021-12-04 DIAGNOSIS — M869 Osteomyelitis, unspecified: Secondary | ICD-10-CM

## 2021-12-04 DIAGNOSIS — L97521 Non-pressure chronic ulcer of other part of left foot limited to breakdown of skin: Secondary | ICD-10-CM

## 2021-12-04 MED ORDER — DOXYCYCLINE MONOHYDRATE 100 MG PO CAPS: 100.0000 mg | ORAL_CAPSULE | Freq: Two times a day (BID) | ORAL | 0 refills | Status: AC

## 2021-12-04 NOTE — Progress Notes (Signed)
Subjective:  Patient ID: Alvin Hardy, male    DOB: 06-20-1935,   MRN: [id number]  Chief Complaint  Patient presents with   Foot Ulcer     left foot pinky bone infection    87 y.o. male presents for follow-up of left fifth digit wound and to discuss MRI results. Has been dressing. Relates continued pain. Has been taking antibiotics. Not diabetic. Denies any other pedal complaints. Denies n/v/f/c.   Past Medical History:  Diagnosis Date   Allergy    Arthritis    psoriatic arthritis, ? , treated /w mmethotrexate    Bipolar 1 disorder ([place])    BPH (benign prostatic hyperplasia)    Cataract 2010   Bilateral   Chest pain    Depression    Deviated septum    TO THE LEFT   ED (erectile dysfunction)    Essential hypertension 06/28/2021   Finger injury    mallet finger-- 02/13/2013, cast in place, followed by dr. Fredna Dow   GERD (gastroesophageal reflux disease)    Gilbert's syndrome    History of radiation therapy 07/24/13-08/27/13   50Gy/25 fx chest   Hyperlipidemia    Hypogonadism male    Laryngopharyngeal reflux    lung ca dx'd 12/2012   rul   Lung mass 02/07/13   RIGHT UPPER LOBE   Morton's neuroma    LEFT FOOT   Neuromuscular disorder ([place])    peripheral neuropathy   Shortness of breath    voice breaks, ? related to reflux, although told by Dr. Ardis Hughs, S.- early 83 yr. old    Objective:  Physical Exam: Vascular: DP/PT pulses 2/4 bilateral. CFT <3 seconds. Normal hair growth on digits. No edema.  Skin. No lacerations or abrasions bilateral feet. Hyperkeratotic lesion medial fifth digit . No erythema or edema surrounding wound today. 0.1 cm wound noted.  Musculoskeletal: MMT 5/5 bilateral lower extremities in DF, PF, Inversion and Eversion. Deceased ROM in DF of ankle joint.  Neurological: Sensation intact to light touch.   Assessment:   1. Osteomyelitis of fifth toe of left foot ([place])   2. Skin ulcer of toe of left foot, limited to breakdown of skin ([place])        Plan:  Patient was evaluated and treated and all questions answered. Ulcer left fifth digit limited to breakdown of skin with underlying osteomyelitis  -MRI reviewed today showing osteo in the distal and middle phalanx of the left fifth digit.  -No debridement today.  -Dressed with betadine, DSD. -Off-loading with surgical shoe. -Continue doxycycline. Refilled prescription  -Discussed glucose control and proper protein-rich diet.  -Discussed if any worsening redness, pain, fever or chills to call or may need to report to the emergency room. Patient expressed understanding.  -Discussed surgical intervention and amputation of the fifth toe. Patient in agreement with plan. -Informed surgical risk consent was reviewed and read aloud to the patient.  I reviewed the films.  I have discussed my findings with the patient in great detail.  I have discussed all risks including but not limited to infection, stiffness, scarring, limp, disability, deformity, damage to blood vessels and nerves, numbness, poor healing, need for braces, arthritis, chronic pain, amputation, death.  All benefits and realistic expectations discussed in great detail.  I have made no promises as to the outcome.  I have provided realistic expectations.  I have offered the patient a 2nd opinion, which they have declined and assured me they preferred to proceed despite the risks. Will plan for surgery  2/14.     Return if symptoms worsen or fail to improve.   Lorenda Peck, DPM

## 2021-12-10 ENCOUNTER — Ambulatory Visit: Payer: Medicare Other | Admitting: Podiatry

## 2021-12-11 ENCOUNTER — Emergency Department (HOSPITAL_COMMUNITY)
Admission: EM | Admit: 2021-12-11 | Discharge: 2021-12-11 | Disposition: A | Discharge status: Home / Self Care | Payer: Federal, State, Local not specified - PPO | Attending: Student | Admitting: Student

## 2021-12-11 ENCOUNTER — Other Ambulatory Visit: Payer: Self-pay

## 2021-12-11 ENCOUNTER — Encounter (HOSPITAL_COMMUNITY): Payer: Self-pay

## 2021-12-11 DIAGNOSIS — R319 Hematuria, unspecified: Secondary | ICD-10-CM | POA: Diagnosis present

## 2021-12-11 DIAGNOSIS — R3915 Urgency of urination: Secondary | ICD-10-CM | POA: Insufficient documentation

## 2021-12-11 DIAGNOSIS — Z7982 Long term (current) use of aspirin: Secondary | ICD-10-CM | POA: Diagnosis not present

## 2021-12-11 LAB — URINALYSIS, ROUTINE W REFLEX MICROSCOPIC
Bilirubin Urine: NEGATIVE
Glucose, UA: NEGATIVE mg/dL
Ketones, ur: NEGATIVE mg/dL
Leukocytes,Ua: NEGATIVE
Nitrite: NEGATIVE
Protein, ur: NEGATIVE mg/dL
RBC / HPF: >50 RBC/hpf — ABNORMAL HIGH (ref 0–5)
Specific Gravity, Urine: 1.009 (ref 1.005–1.030)
pH: 7 (ref 5.0–8.0)

## 2021-12-11 LAB — BASIC METABOLIC PANEL
Anion gap: 8 (ref 5–15)
BUN: 11 mg/dL (ref 8–23)
CO2: 24 mmol/L (ref 22–32)
Calcium: 9 mg/dL (ref 8.9–10.3)
Chloride: 107 mmol/L (ref 98–111)
Creatinine, Ser: 1.14 mg/dL (ref 0.61–1.24)
GFR, Estimated: >60 mL/min (ref >60)
Glucose, Bld: 154 mg/dL — ABNORMAL HIGH (ref 70–99)
Potassium: 4.3 mmol/L (ref 3.5–5.1)
Sodium: 139 mmol/L (ref 135–145)

## 2021-12-11 LAB — CBC WITH DIFFERENTIAL/PLATELET
Abs Immature Granulocytes: 0.04 10*3/uL (ref 0.00–0.07)
Basophils Absolute: 0.1 10*3/uL (ref 0.0–0.1)
Basophils Relative: 1 %
Eosinophils Absolute: 0.2 10*3/uL (ref 0.0–0.5)
Eosinophils Relative: 2 %
HCT: 44.5 % (ref 39.0–52.0)
Hemoglobin: 13.4 g/dL (ref 13.0–17.0)
Immature Granulocytes: 0 %
Lymphocytes Relative: 16 %
Lymphs Abs: 1.6 10*3/uL (ref 0.7–4.0)
MCH: 24.5 pg — ABNORMAL LOW (ref 26.0–34.0)
MCHC: 30.1 g/dL (ref 30.0–36.0)
MCV: 81.5 fL (ref 80.0–100.0)
Monocytes Absolute: 0.8 10*3/uL (ref 0.1–1.0)
Monocytes Relative: 7 %
Neutro Abs: 7.6 10*3/uL (ref 1.7–7.7)
Neutrophils Relative %: 74 %
Platelets: 232 10*3/uL (ref 150–400)
RBC: 5.46 MIL/uL (ref 4.22–5.81)
RDW: 16.5 % — ABNORMAL HIGH (ref 11.5–15.5)
WBC: 10.3 10*3/uL (ref 4.0–10.5)
nRBC: 0 % (ref 0.0–0.2)

## 2021-12-11 NOTE — ED Provider Notes (Signed)
[facility] DEPT Provider Note   CSN: [id number] Arrival date & time: 12/11/21  1338     History  Chief Complaint  Patient presents with   Hematuria    Alvin Hardy is a 87 y.o. male.  HPI Patient presents for evaluation of urinary urgency, inability to void, followed by passing a blood clot from his penis then being able to void.  This occurred over the last 16 hours.  He denies abdominal pain, back pain, dysuria, urinary frequency, nausea, vomiting, constipation.  His PCP recently evaluated him for occult blood and apparently had 2 out of 3 samples positive.  He has been referred to GI.    Home Medications Prior to Admission medications   Medication Sig Start Date End Date Taking? Authorizing Provider  albuterol (VENTOLIN HFA) 108 (90 Base) MCG/ACT inhaler Inhale 2 puffs into the lungs every 4 (four) hours as needed for shortness of breath. 06/22/21   [provider]  aspirin EC 81 MG tablet Take 81 mg by mouth every morning.     [provider]  Calcium Carbonate-Vitamin D 600-400 MG-UNIT tablet Take 1 tablet by mouth every morning.    [provider]  Cholecalciferol (VITAMIN D-3) 1000 UNITS CAPS Take 1,000 Units by mouth every morning.     [provider]  clonazePAM (KLONOPIN) 0.5 MG tablet Take 1 tablet (0.5 mg total) by mouth at bedtime. 07/08/21   Dessa Phi, DO  cyanocobalamin 1000 MCG tablet Take 1,000 mcg by mouth every morning.     [provider]  doxycycline (MONODOX) 100 MG capsule Take 1 capsule (100 mg total) by mouth 2 (two) times daily for 14 days. 12/04/21 12/18/21  Lorenda Peck, MD  esomeprazole (NEXIUM) 40 MG capsule Take 40 mg by mouth daily before breakfast.    [provider]  finasteride (PROSCAR) 5 MG tablet Take 5 mg by mouth daily before breakfast.     [provider]  furosemide (LASIX) 20 MG tablet TAKE 1 TABLET(20 MG) BY MOUTH DAILY 08/30/21   Maryjane Hurter, MD  gabapentin (NEURONTIN) 300 MG capsule Take 1,200 mg by mouth at bedtime. He takes two capsules every morning and three capsules at bedtime.    [provider]  lithium carbonate (LITHOBID) 300 MG CR tablet Take 600 mg by mouth at bedtime.     [provider]  Multiple Vitamin (MULTIVITAMIN WITH MINERALS) TABS tablet Take 1 tablet by mouth every morning.    [provider]  OVER THE COUNTER MEDICATION Take 1 tablet by mouth 2 (two) times daily. Glucosamine Chondroitin 1500mg /1200mg     [provider]  potassium chloride (KLOR-CON) 10 MEQ tablet Take 10 mEq by mouth daily. 09/07/21   [provider]  Probiotic Product (PROBIOTIC DAILY PO) Take 1 each by mouth daily.    [provider]  REPATHA SURECLICK 884 MG/ML SOAJ Inject 140 mg as directed every 14 (fourteen) days. 05/28/16   [provider]  tamsulosin (FLOMAX) 0.4 MG CAPS capsule Take 0.4 mg by mouth daily. 12/19/14   [provider]  testosterone cypionate (DEPOTESTOTERONE CYPIONATE) 200 MG/ML injection Inject 0.5 mLs into the muscle every 21 ( twenty-one) days. Now 300mg /ml 11/05/14   [provider]  umeclidinium-vilanterol (ANORO ELLIPTA) 62.5-25 MCG/ACT AEPB Inhale 1 puff into the lungs daily. 08/19/21   Maryjane Hurter, MD      Allergies    Codeine, Hydrocodone-acetaminophen, Omeprazole magnesium, Tramadol, Decadron [dexamethasone], Fenofibrate, Neomycin-bacitracin zn-polymyx, Other, Penicillin  g, Propoxyphene, Statins, Ubidecarenone, Zetia [ezetimibe], and Zoloft [sertraline hcl]    Review of Systems   Review of Systems  Physical Exam Updated Vital Signs BP 109/65    Pulse 91    Temp 98.2 F (36.8 C) (Oral)    Resp 16    Ht 5\' 10"  (1.778 m)    Wt 83.8 kg    SpO2 97%    BMI 26.52 kg/m  Physical Exam Vitals and nursing note reviewed.  Constitutional:      General: He is not in acute distress.    Appearance: He is well-developed. He is  not ill-appearing or diaphoretic.  HENT:     Head: Normocephalic and atraumatic.     Right Ear: External ear normal.     Left Ear: External ear normal.  Eyes:     Conjunctiva/sclera: Conjunctivae normal.     Pupils: Pupils are equal, round, and reactive to light.  Neck:     Trachea: Phonation normal.  Cardiovascular:     Rate and Rhythm: Normal rate.  Pulmonary:     Effort: Pulmonary effort is normal.  Abdominal:     General: There is no distension.     Tenderness: There is no abdominal tenderness. There is no guarding.  Musculoskeletal:        General: Normal range of motion.     Cervical back: Normal range of motion and neck supple.  Skin:    General: Skin is warm and dry.  Neurological:     Mental Status: He is alert and oriented to person, place, and time.     Cranial Nerves: No cranial nerve deficit.     Sensory: No sensory deficit.     Motor: No abnormal muscle tone.     Coordination: Coordination normal.  Psychiatric:        Mood and Affect: Mood normal.        Behavior: Behavior normal.        Thought Content: Thought content normal.        Judgment: Judgment normal.    ED Results / Procedures / Treatments   Labs (all labs ordered are listed, but only abnormal results are displayed) Labs Reviewed  BASIC METABOLIC PANEL - Abnormal; Notable for the following components:      Result Value   Glucose, Bld 154 (*)    All other components within normal limits  CBC WITH DIFFERENTIAL/PLATELET - Abnormal; Notable for the following components:   MCH 24.5 (*)    RDW 16.5 (*)    All other components within normal limits  URINALYSIS, ROUTINE W REFLEX MICROSCOPIC - Abnormal; Notable for the following components:   Hgb urine dipstick LARGE (*)    RBC / HPF >50 (*)    Bacteria, UA RARE (*)    All other components within normal limits    EKG None  Radiology No results found.  Procedures Procedures    Medications Ordered in ED Medications - No data to  display  ED Course/ Medical Decision Making/ A&P                           Medical Decision Making Patient presenting for inability to urinate, followed by passing of blood, then being able to urinate.  He has had this problem similar, in the past multiple times since he had a TURP.  He was evaluated with cystoscopy, about 5 months ago following hospitalization for pneumonia.  During hospitalization he had Foley  catheterization and had difficulty urinating for several weeks afterwards.  Since that time he has been urinating freely.  Problems Addressed: Hematuria, unspecified type: acute illness or injury    Details: Onset last night, improved urination after passing a blood clot  Amount and/or Complexity of Data Reviewed Independent Historian: spouse    Details: Wife at bedside had some details to the history. External Data Reviewed: labs, radiology and notes.    Details: Recent pulmonology evaluation for fibrosis and COPD, with medication adjustment.  Podiatry recently was placed on [facility] for toe infection.  Sees cardiology regularly for routine care.  He is not anticoagulated. Labs: ordered.    Details: CBC, metabolic panel, urinalysis-normal except blood in urine.  No significant elevation of white cells in urinalysis. Discussion of management or test interpretation with external provider(s): Consultation with urology, discussing hematuria and difficulty urinating.  Dr. Diona Fanti, came to the ED to talk with the patient.  Patient is passing brown-colored urine, without pain.  He does not require a urinary catheter.  He will follow-up with Dr. Erven Colla in the office.  Risk Decision regarding hospitalization. Risk Details: Nontoxic patient with transient urinary hesitancy, improved after passing a blood clot while urinating.  Recent diagnosis of occult blood in stool.  Patient's hemoglobin stable today.  No evidence for hemodynamic instability.  No ongoing urinary retention.  Suspect  non-infective bleeding in the urinary tract.           Final Clinical Impression(s) / ED Diagnoses Final diagnoses:  Hematuria, unspecified type    Rx / DC Orders ED Discharge Orders     None         Daleen Bo, MD 12/11/21 [phone]

## 2021-12-11 NOTE — Consult Note (Addendum)
Urology Consult  Consulting MD: Dr. Eulis Foster  CC: Blood in urine  HPI: This is a 87year old male with BPH as well as hypogonadism.  He follows in our office.  He does take finasteride and tamsulosin for his BPH and usually does not have an issue with urinary difficulty.  He presented to the emergency room with less than a day of initial gross hematuria.  He is passing a few clots.  He is on, usually, a baby aspirin but stopped this 3 days ago as he is having upcoming toe surgery.  The patient urinated in the emergency room today.  He passed a clot at the beginning of his stream but behind this the urine was brown without clots.  He had a good flow.  He is having no associated dysuria, fever, chills or abdominal pain.  PMH: Past Medical History:  Diagnosis Date   Allergy    Arthritis    psoriatic arthritis, ? , treated /w mmethotrexate    Bipolar 1 disorder ([place])    BPH (benign prostatic hyperplasia)    Cataract 2010   Bilateral   Chest pain    Depression    Deviated septum    TO THE LEFT   ED (erectile dysfunction)    Essential hypertension 06/28/2021   Finger injury    mallet finger-- 02/13/2013, cast in place, followed by dr. Fredna Dow   GERD (gastroesophageal reflux disease)    Gilbert's syndrome    History of radiation therapy 07/24/13-08/27/13   50Gy/25 fx chest   Hyperlipidemia    Hypogonadism male    Laryngopharyngeal reflux    lung ca dx'd 12/2012   rul   Lung mass 02/07/13   RIGHT UPPER LOBE   Morton's neuroma    LEFT FOOT   Neuromuscular disorder ([place])    peripheral neuropathy   Shortness of breath    voice breaks, ? related to reflux, although told by Dr. Ardis Hughs, S.- early 81 yr. old    PSH: Past Surgical History:  Procedure Laterality Date   CYSTOSCOPY N/A 02/21/2013   Procedure: CYSTOSCOPY FLEXIBLE with insertion of foley catheter;  Surgeon: Fredricka Bonine, MD;  Location: [facility];  Service: Urology;  Laterality: N/A;   EYE SURGERY     cataracts removed  fr. both eyes, IOL in place    KNEE ARTHROSCOPY Right 01/2007   PILONIDAL CYST EXCISION  1960's   PROSTATE SURGERY  1996   TURP   VIDEO ASSISTED THORACOSCOPY (VATS)/WEDGE RESECTION Right 02/21/2013   Procedure: Right VIDEO ASSISTED THORACOSCOPY ,Thoracotomy with right upper lobectomy, node sampling ;  Surgeon: Grace Isaac, MD;  Location: [facility];  Service: Thoracic;  Laterality: Right;   VIDEO BRONCHOSCOPY N/A 02/21/2013   Procedure: VIDEO BRONCHOSCOPY;  Surgeon: Grace Isaac, MD;  Location: [facility];  Service: Thoracic;  Laterality: N/A;    Allergies: Allergies  Allergen Reactions   Codeine Anaphylaxis   Hydrocodone-Acetaminophen     Other reaction(s): Other (See Comments) Reactions unknown   Omeprazole Magnesium     Other reaction(s): Other (See Comments) Reaction Unknown   Tramadol     Other reaction(s): Other (See Comments) Reactions Unknown   Decadron [Dexamethasone]     Pt says it is contraindication due to the lithium he is taking   Fenofibrate     Other reaction(s): Other (See Comments) Reactions Unknown   Neomycin-Bacitracin Zn-Polymyx     Other reaction(s): Other (See Comments) Reactions Unknown   Other     Pt. Remarks that  all pain med. Make him nauseated    Penicillin G     Other reaction(s): Other (See Comments) Reactions Unknown   Propoxyphene     Other reaction(s): Other (See Comments) Reaction Unknown   Statins     Lipitor, Crestor, Zocor all caused muscle aches   Ubidecarenone     Other reaction(s): Other (See Comments) Reactions Unknown   Zetia [Ezetimibe]     Muscle aches   Zoloft [Sertraline Hcl] Other (See Comments)    REACTION:  unknown    Medications: (Not in a hospital admission)    Social History: Social History   Socioeconomic History   Marital status: Married    Spouse name: Not on file   Number of children: Not on file   Years of education: Not on file   Highest education level: Not on file  Occupational History   Not on  file  Tobacco Use   Smoking status: Former    Packs/day: 0.50    Years: 24.00    Pack years: 12.00    Types: Cigarettes, Pipe    Quit date: 11/01/1978    Years since quitting: 43.1   Smokeless tobacco: Never  Vaping Use   Vaping Use: Never used  Substance and Sexual Activity   Alcohol use: Yes    Comment: occas.   Drug use: No   Sexual activity: Not on file  Other Topics Concern   Not on file  Social History Narrative   Not on file   Social Determinants of Health   Financial Resource Strain: Not on file  Food Insecurity: Not on file  Transportation Needs: Not on file  Physical Activity: Not on file  Stress: Not on file  Social Connections: Not on file  Intimate Partner Violence: Not on file    Family History: Family History  Problem Relation Age of Onset   Diabetes Mother    Cancer Mother        abdomen   Heart disease Father    Diabetes Son    Cancer Maternal Aunt        lung ca, smoker    Review of Systems: Positive: Gross hematuria, clots Negative: .  A further 10 point review of systems was negative except what is listed in the HPI.  Physical Exam: @VITALS2 @ General: No acute distress.  Awake. Head:  Normocephalic.  Atraumatic. ENT:  EOMI.  Mucous membranes moist Neck:  Supple.  No lymphadenopathy. CV:  Regular rate. Pulmonary: Equal effort bilaterally.   Abdomen: Soft.  Non-tender to palpation.  Bladder nonpalpable  Studies:  Recent Labs    12/11/21 1414  HGB 13.4  WBC 10.3  PLT 232    Recent Labs    12/11/21 1414  NA 139  K 4.3  CL 107  CO2 24  BUN 11  CREATININE 1.14  CALCIUM 9.0  GFRNONAA >60     No results for input(s): INR, APTT in the last 72 hours.  Invalid input(s): PT   Invalid input(s): ABG    Assessment: Gross hematuria, most likely secondary to prostate bleeding, seemingly resolving  Plan: As he has voided here and the urine is less bloody/light brown, I think it is fine to send the patient home.  I told him  to take it easy this weekend, drink plenty of fluids.  I am on call the rest of the weekend if he needs further management, he will call  I have recommended that he drop a urine specimen off at his next  visit with [place] which will be in the next 2 to 3 weeks.    Pager:[phone]

## 2021-12-11 NOTE — ED Provider Triage Note (Signed)
Emergency Medicine Provider Triage Evaluation Note  Alvin Hardy , a 87 y.o. male  was evaluated in triage.  Pt complains of blood in the urine and difficulty urinating.  Patient reports positive fecal occult blood testing done at PCP last week.  He has not seen any blood in the stool.  GI follow-up for this scheduled next week.  Today he was having some difficulty urinating.  All of a sudden his urine started to flow and he passed a bloody clot.  Denies pain/dysuria, increased frequency or urgency.  Reports previous TURP.  Review of Systems  Positive: Hematuria Negative: Abdominal pain  Physical Exam  BP (!) 151/79    Pulse (!) 108    Temp 98.2 F (36.8 C) (Oral)    Resp 18    Ht 5\' 10"  (1.778 m)    Wt 83.8 kg    SpO2 97%    BMI 26.52 kg/m  Gen:   Awake, no distress   Resp:  Normal effort  MSK:   Moves extremities without difficulty  Other:  Abdomen including suprapubic area soft and nontender.  Medical Decision Making  Medically screening exam initiated at 1:54 PM.  Appropriate orders placed.  Alvin Hardy was informed that the remainder of the evaluation will be completed by another provider, this initial triage assessment does not replace that evaluation, and the importance of remaining in the ED until their evaluation is complete.     Carlisle Cater, PA-C 12/11/21 1356

## 2021-12-11 NOTE — ED Triage Notes (Signed)
Patient states he wa unable to urinate last night and having difficulty voiding this AM. Patient states that he had blood with a clot and states that he had more blood and very little urine. Patient also reports that he had a stool that tested positive for blood a couple weeks ago. Patient states that he has been taking Doxycycline and is on round 3 for a left toe infection/osteomyelitis.

## 2021-12-14 ENCOUNTER — Ambulatory Visit: Payer: Medicare Other | Admitting: Podiatry

## 2021-12-14 ENCOUNTER — Other Ambulatory Visit: Payer: Self-pay | Admitting: Gastroenterology

## 2021-12-14 DIAGNOSIS — R195 Other fecal abnormalities: Secondary | ICD-10-CM

## 2021-12-14 DIAGNOSIS — K625 Hemorrhage of anus and rectum: Secondary | ICD-10-CM

## 2021-12-14 DIAGNOSIS — R6881 Early satiety: Secondary | ICD-10-CM

## 2021-12-15 ENCOUNTER — Other Ambulatory Visit: Payer: Self-pay | Admitting: Podiatry

## 2021-12-15 ENCOUNTER — Encounter: Payer: Self-pay | Admitting: Podiatry

## 2021-12-15 DIAGNOSIS — M86672 Other chronic osteomyelitis, left ankle and foot: Secondary | ICD-10-CM | POA: Diagnosis not present

## 2021-12-15 MED ORDER — OXYCODONE HCL 5 MG PO CAPS: 5.0000 mg | ORAL_CAPSULE | Freq: Four times a day (QID) | ORAL | 0 refills | Status: AC | PRN

## 2021-12-15 MED ORDER — CEPHALEXIN 500 MG PO CAPS: 500.0000 mg | ORAL_CAPSULE | Freq: Four times a day (QID) | ORAL | 0 refills | Status: AC

## 2021-12-21 ENCOUNTER — Telehealth: Payer: Self-pay | Admitting: *Deleted

## 2021-12-21 NOTE — Telephone Encounter (Signed)
He has a spot on his leg and has is seeing a dermatologist for this, reason for a sooner trying to get appointment w/ [place].

## 2021-12-21 NOTE — Telephone Encounter (Signed)
Ok that will be fine

## 2021-12-21 NOTE — Telephone Encounter (Signed)
He should not need anymore antibiotics unless there is a problem he needs to come in sooner for.

## 2021-12-21 NOTE — Telephone Encounter (Signed)
Patient is calling to let the physician know that he has completed his antibiotic, and that he has an upcoming appointment on 12/31/21 but would like a sooner appointment if possible. Please advise.

## 2021-12-21 NOTE — Telephone Encounter (Signed)
Please call patient for a sooner appointment if available.

## 2021-12-23 ENCOUNTER — Encounter: Payer: Self-pay | Admitting: Podiatry

## 2021-12-23 ENCOUNTER — Telehealth: Payer: Self-pay | Admitting: *Deleted

## 2021-12-23 ENCOUNTER — Other Ambulatory Visit: Payer: Self-pay

## 2021-12-23 ENCOUNTER — Ambulatory Visit: Payer: Federal, State, Local not specified - PPO | Admitting: Podiatry

## 2021-12-23 DIAGNOSIS — Z9889 Other specified postprocedural states: Secondary | ICD-10-CM

## 2021-12-23 DIAGNOSIS — L97521 Non-pressure chronic ulcer of other part of left foot limited to breakdown of skin: Secondary | ICD-10-CM

## 2021-12-23 NOTE — Progress Notes (Signed)
Subjective:  Patient ID: Alvin Hardy, male    DOB: 26-Feb-1935,  MRN: [id number]  No chief complaint on file.   DOS: 01/12/22  Procedure: Left partial fifth digit amputation  87 y.o. male returns for POV#1. Relates he is doing well without much pain. Dressing came loose so here a little early.   Review of Systems: Negative except as noted in the HPI. Denies N/V/F/Ch.  Past Medical History:  Diagnosis Date   Allergy    Arthritis    psoriatic arthritis, ? , treated /w mmethotrexate    Bipolar 1 disorder ([place])    BPH (benign prostatic hyperplasia)    Cataract 2010   Bilateral   Chest pain    Depression    Deviated septum    TO THE LEFT   ED (erectile dysfunction)    Essential hypertension 06/28/2021   Finger injury    mallet finger-- 02/13/2013, cast in place, followed by dr. Fredna Dow   GERD (gastroesophageal reflux disease)    Gilbert's syndrome    History of radiation therapy 07/24/13-08/27/13   50Gy/25 fx chest   Hyperlipidemia    Hypogonadism male    Laryngopharyngeal reflux    lung ca dx'd 12/2012   rul   Lung mass 02/07/13   RIGHT UPPER LOBE   Morton's neuroma    LEFT FOOT   Neuromuscular disorder ([facility])    peripheral neuropathy   Shortness of breath    voice breaks, ? related to reflux, although told by Dr. Ardis Hughs, S.- early 60 yr. old    Current Outpatient Medications:    albuterol (VENTOLIN HFA) 108 (90 Base) MCG/ACT inhaler, Inhale 2 puffs into the lungs every 4 (four) hours as needed for shortness of breath., Disp: , Rfl:    aspirin EC 81 MG tablet, Take 81 mg by mouth every morning. , Disp: , Rfl:    Calcium Carbonate-Vitamin D 600-400 MG-UNIT tablet, Take 1 tablet by mouth every morning., Disp: , Rfl:    Cholecalciferol (VITAMIN D-3) 1000 UNITS CAPS, Take 1,000 Units by mouth every morning. , Disp: , Rfl:    clonazePAM (KLONOPIN) 0.5 MG tablet, Take 1 tablet (0.5 mg total) by mouth at bedtime., Disp: 30 tablet, Rfl: 0   cyanocobalamin 1000 MCG tablet, Take  1,000 mcg by mouth every morning. , Disp: , Rfl:    esomeprazole (NEXIUM) 40 MG capsule, Take 40 mg by mouth daily before breakfast., Disp: , Rfl:    finasteride (PROSCAR) 5 MG tablet, Take 5 mg by mouth daily before breakfast. , Disp: , Rfl:    furosemide (LASIX) 20 MG tablet, TAKE 1 TABLET(20 MG) BY MOUTH DAILY, Disp: 90 tablet, Rfl: 0   gabapentin (NEURONTIN) 300 MG capsule, Take 1,200 mg by mouth at bedtime. He takes two capsules every morning and three capsules at bedtime., Disp: , Rfl:    lithium carbonate (LITHOBID) 300 MG CR tablet, Take 600 mg by mouth at bedtime. , Disp: , Rfl:    Multiple Vitamin (MULTIVITAMIN WITH MINERALS) TABS tablet, Take 1 tablet by mouth every morning., Disp: , Rfl:    OVER THE COUNTER MEDICATION, Take 1 tablet by mouth 2 (two) times daily. Glucosamine Chondroitin 1500mg /1200mg , Disp: , Rfl:    potassium chloride (KLOR-CON) 10 MEQ tablet, Take 10 mEq by mouth daily., Disp: , Rfl:    Probiotic Product (PROBIOTIC DAILY PO), Take 1 each by mouth daily., Disp: , Rfl:    REPATHA SURECLICK 175 MG/ML SOAJ, Inject 140 mg as directed every 14 (fourteen) days.,  Disp: , Rfl:    tamsulosin (FLOMAX) 0.4 MG CAPS capsule, Take 0.4 mg by mouth daily., Disp: , Rfl: 3   testosterone cypionate (DEPOTESTOTERONE CYPIONATE) 200 MG/ML injection, Inject 0.5 mLs into the muscle every 21 ( twenty-one) days. Now 300mg /ml, Disp: , Rfl: 1   umeclidinium-vilanterol (ANORO ELLIPTA) 62.5-25 MCG/ACT AEPB, Inhale 1 puff into the lungs daily., Disp: 1 each, Rfl: 11  Social History   Tobacco Use  Smoking Status Former   Packs/day: 0.50   Years: 24.00   Pack years: 12.00   Types: Cigarettes, Pipe   Quit date: 11/01/1978   Years since quitting: 43.1  Smokeless Tobacco Never    Allergies  Allergen Reactions   Codeine Anaphylaxis   Hydrocodone-Acetaminophen     Other reaction(s): Other (See Comments) Reactions unknown   Omeprazole Magnesium     Other reaction(s): Other (See  Comments) Reaction Unknown   Tramadol     Other reaction(s): Other (See Comments) Reactions Unknown   Decadron [Dexamethasone]     Pt says it is contraindication due to the lithium he is taking   Fenofibrate     Other reaction(s): Other (See Comments) Reactions Unknown   Neomycin-Bacitracin Zn-Polymyx     Other reaction(s): Other (See Comments) Reactions Unknown   Other     Pt. Remarks that all pain med. Make him nauseated    Penicillin G     Other reaction(s): Other (See Comments) Reactions Unknown   Propoxyphene     Other reaction(s): Other (See Comments) Reaction Unknown   Statins     Lipitor, Crestor, Zocor all caused muscle aches   Ubidecarenone     Other reaction(s): Other (See Comments) Reactions Unknown   Zetia [Ezetimibe]     Muscle aches   Zoloft [Sertraline Hcl] Other (See Comments)    REACTION:  unknown   Objective:  There were no vitals filed for this visit. There is no height or weight on file to calculate BMI. Constitutional Well developed. Well nourished.  Vascular Foot warm and well perfused. Capillary refill normal to all digits.   Neurologic Normal speech. Oriented to person, place, and time. Epicritic sensation to light touch grossly present bilaterally.  Dermatologic Skin healing well without signs of infection. Skin edges well coapted without signs of infection.  Orthopedic: Tenderness to palpation noted about the surgical site.   Radiographs: Partial amputation of fifth digit. No new osseous erosions  Assessment:  No diagnosis found. Plan:  Patient was evaluated and treated and all questions answered.  S/p foot surgery left -Progressing as expected post-operatively. -WB Status: WBAT in surgical shoe  -Sutures: intact . -Medications: n/a -Foot redressed. Follow-up in 2 weeks for suture removal   No follow-ups on file.

## 2021-12-23 NOTE — Telephone Encounter (Signed)
Patient is having problems with his bandages, seems too loose,requesting a sooner appointment for today.  He has been rescheduled for today @ 2:15,confirmed  w/ patient.

## 2021-12-24 ENCOUNTER — Encounter: Payer: Federal, State, Local not specified - PPO | Admitting: Podiatry

## 2022-01-05 ENCOUNTER — Other Ambulatory Visit: Payer: Self-pay

## 2022-01-05 ENCOUNTER — Ambulatory Visit
Admission: RE | Admit: 2022-01-05 | Discharge: 2022-01-05 | Disposition: A | Discharge status: Home / Self Care | Payer: Federal, State, Local not specified - PPO | Source: Ambulatory Visit | Attending: Gastroenterology | Admitting: Gastroenterology

## 2022-01-05 DIAGNOSIS — K625 Hemorrhage of anus and rectum: Secondary | ICD-10-CM

## 2022-01-05 DIAGNOSIS — R6881 Early satiety: Secondary | ICD-10-CM

## 2022-01-05 DIAGNOSIS — R195 Other fecal abnormalities: Secondary | ICD-10-CM

## 2022-01-05 MED ORDER — IOPAMIDOL (ISOVUE-300) INJECTION 61%
100.0000 mL | Freq: Once | INTRAVENOUS | Status: AC | PRN
  Administered 2022-01-05: 100 mL via INTRAVENOUS

## 2022-01-06 ENCOUNTER — Encounter: Payer: Self-pay | Admitting: Podiatry

## 2022-01-06 ENCOUNTER — Telehealth: Payer: Self-pay | Admitting: *Deleted

## 2022-01-06 ENCOUNTER — Ambulatory Visit (INDEPENDENT_AMBULATORY_CARE_PROVIDER_SITE_OTHER): Payer: Federal, State, Local not specified - PPO | Admitting: Podiatry

## 2022-01-06 DIAGNOSIS — M869 Osteomyelitis, unspecified: Secondary | ICD-10-CM

## 2022-01-06 DIAGNOSIS — Z9889 Other specified postprocedural states: Secondary | ICD-10-CM

## 2022-01-06 NOTE — Telephone Encounter (Signed)
Its is just dry skin and can be treated with lotions like Aveeno or Cerave

## 2022-01-06 NOTE — Telephone Encounter (Signed)
Patient is calling and wants to speak with the physician, forgot to ask if the stuff on his feet is not fungus, what is it and how to treat? Please advise. ?

## 2022-01-06 NOTE — Progress Notes (Signed)
?Subjective:  ?Patient ID: Alvin Hardy, male    DOB: 03/15/1935,  MRN: [id number] ? ?No chief complaint on file. ? ? ?DOS: 12/15/21  ?Procedure: Left partial fifth digit amputation ? ?87 y.o. male returns for POV#2. Relates he is doing well without much pain. Here today for suture removal   ? ?Review of Systems: Negative except as noted in the HPI. Denies N/V/F/Ch. ? ?Past Medical History:  ?Diagnosis Date  ? Allergy   ? Arthritis   ? psoriatic arthritis, ? , treated /w mmethotrexate   ? Bipolar 1 disorder ([place])   ? BPH (benign prostatic hyperplasia)   ? Cataract 2010  ? Bilateral  ? Chest pain   ? Depression   ? Deviated septum   ? TO THE LEFT  ? ED (erectile dysfunction)   ? Essential hypertension 06/28/2021  ? Finger injury   ? mallet finger-- 02/13/2013, cast in place, followed by dr. Fredna Dow  ? GERD (gastroesophageal reflux disease)   ? Gilbert's syndrome   ? History of radiation therapy 07/24/13-08/27/13  ? 50Gy/25 fx chest  ? Hyperlipidemia   ? Hypogonadism male   ? Laryngopharyngeal reflux   ? lung ca dx'd 12/2012  ? rul  ? Lung mass 02/07/13  ? RIGHT UPPER LOBE  ? Morton's neuroma   ? LEFT FOOT  ? Neuromuscular disorder ([place])   ? peripheral neuropathy  ? Shortness of breath   ? voice breaks, ? related to reflux, although told by Dr. Ardis Hughs, S.- early 33 yr. old  ? ? ?Current Outpatient Medications:  ?  albuterol (VENTOLIN HFA) 108 (90 Base) MCG/ACT inhaler, Inhale 2 puffs into the lungs every 4 (four) hours as needed for shortness of breath., Disp: , Rfl:  ?  aspirin EC 81 MG tablet, Take 81 mg by mouth every morning. , Disp: , Rfl:  ?  Calcium Carbonate-Vitamin D 600-400 MG-UNIT tablet, Take 1 tablet by mouth every morning., Disp: , Rfl:  ?  Cholecalciferol (VITAMIN D-3) 1000 UNITS CAPS, Take 1,000 Units by mouth every morning. , Disp: , Rfl:  ?  clonazePAM (KLONOPIN) 0.5 MG tablet, Take 1 tablet (0.5 mg total) by mouth at bedtime., Disp: 30 tablet, Rfl: 0 ?  cyanocobalamin 1000 MCG tablet, Take 1,000 mcg by  mouth every morning. , Disp: , Rfl:  ?  esomeprazole (NEXIUM) 40 MG capsule, Take 40 mg by mouth daily before breakfast., Disp: , Rfl:  ?  finasteride (PROSCAR) 5 MG tablet, Take 5 mg by mouth daily before breakfast. , Disp: , Rfl:  ?  furosemide (LASIX) 20 MG tablet, TAKE 1 TABLET(20 MG) BY MOUTH DAILY, Disp: 90 tablet, Rfl: 0 ?  gabapentin (NEURONTIN) 300 MG capsule, Take 1,200 mg by mouth at bedtime. He takes two capsules every morning and three capsules at bedtime., Disp: , Rfl:  ?  lithium carbonate (LITHOBID) 300 MG CR tablet, Take 600 mg by mouth at bedtime. , Disp: , Rfl:  ?  Multiple Vitamin (MULTIVITAMIN WITH MINERALS) TABS tablet, Take 1 tablet by mouth every morning., Disp: , Rfl:  ?  OVER THE COUNTER MEDICATION, Take 1 tablet by mouth 2 (two) times daily. Glucosamine Chondroitin 1500mg /1200mg , Disp: , Rfl:  ?  potassium chloride (KLOR-CON) 10 MEQ tablet, Take 10 mEq by mouth daily., Disp: , Rfl:  ?  Probiotic Product (PROBIOTIC DAILY PO), Take 1 each by mouth daily., Disp: , Rfl:  ?  REPATHA SURECLICK 409 MG/ML SOAJ, Inject 140 mg as directed every 14 (fourteen) days., Disp: ,  Rfl:  ?  tamsulosin (FLOMAX) 0.4 MG CAPS capsule, Take 0.4 mg by mouth daily., Disp: , Rfl: 3 ?  testosterone cypionate (DEPOTESTOTERONE CYPIONATE) 200 MG/ML injection, Inject 0.5 mLs into the muscle every 21 ( twenty-one) days. Now 300mg /ml, Disp: , Rfl: 1 ?  umeclidinium-vilanterol (ANORO ELLIPTA) 62.5-25 MCG/ACT AEPB, Inhale 1 puff into the lungs daily., Disp: 1 each, Rfl: 11 ? ?Social History  ? ?Tobacco Use  ?Smoking Status Former  ? Packs/day: 0.50  ? Years: 24.00  ? Pack years: 12.00  ? Types: Cigarettes, Pipe  ? Quit date: 11/01/1978  ? Years since quitting: 43.2  ?Smokeless Tobacco Never  ? ? ?Allergies  ?Allergen Reactions  ? Codeine Anaphylaxis  ? Hydrocodone-Acetaminophen   ?  Other reaction(s): Other (See Comments) ?Reactions unknown  ? Omeprazole Magnesium   ?  Other reaction(s): Other (See Comments) ?Reaction Unknown   ? Tramadol   ?  Other reaction(s): Other (See Comments) ?Reactions Unknown  ? Decadron [Dexamethasone]   ?  Pt says it is contraindication due to the lithium he is taking  ? Fenofibrate   ?  Other reaction(s): Other (See Comments) ?Reactions Unknown  ? Neomycin-Bacitracin Zn-Polymyx   ?  Other reaction(s): Other (See Comments) ?Reactions Unknown  ? Other   ?  Pt. Remarks that all pain med. Make him nauseated   ? Penicillin G   ?  Other reaction(s): Other (See Comments) ?Reactions Unknown  ? Propoxyphene   ?  Other reaction(s): Other (See Comments) ?Reaction Unknown  ? Statins   ?  Lipitor, Crestor, Zocor all caused muscle aches  ? Ubidecarenone   ?  Other reaction(s): Other (See Comments) ?Reactions Unknown  ? Zetia [Ezetimibe]   ?  Muscle aches  ? Zoloft [Sertraline Hcl] Other (See Comments)  ?  REACTION:  unknown  ? ?Objective:  ?There were no vitals filed for this visit. ?There is no height or weight on file to calculate BMI. ?Constitutional Well developed. ?Well nourished.  ?Vascular Foot warm and well perfused. ?Capillary refill normal to all digits.   ?Neurologic Normal speech. ?Oriented to person, place, and time. ?Epicritic sensation to light touch grossly present bilaterally.  ?Dermatologic Skin healing well without signs of infection. Skin edges well coapted without signs of infection.  ?Orthopedic: Tenderness to palpation noted about the surgical site.  ? ?Radiographs: Partial amputation of fifth digit. No new osseous erosions  ?Assessment:  ?No diagnosis found. ?Plan:  ?Patient was evaluated and treated and all questions answered. ? ?S/p foot surgery left ?-Progressing as expected post-operatively. ?-WB Status: May transition to regular shoes WBAT.  ?-Sutures: removed without incident.  ?-Medications: n/a ?-Foot redressed. ?Follow-up in 3 weeks for re-evaluation.  ? ?No follow-ups on file.  ? ?

## 2022-01-07 ENCOUNTER — Encounter: Payer: Federal, State, Local not specified - PPO | Admitting: Podiatry

## 2022-01-07 NOTE — Telephone Encounter (Signed)
Notified patient thru vmessage,no answer.

## 2022-01-19 ENCOUNTER — Ambulatory Visit: Payer: Federal, State, Local not specified - PPO | Admitting: Student

## 2022-01-25 ENCOUNTER — Ambulatory Visit (HOSPITAL_COMMUNITY)
Admission: RE | Admit: 2022-01-25 | Discharge: 2022-01-25 | Disposition: A | Discharge status: Home / Self Care | Payer: Federal, State, Local not specified - PPO | Source: Ambulatory Visit | Attending: Internal Medicine | Admitting: Internal Medicine

## 2022-01-25 ENCOUNTER — Inpatient Hospital Stay: Payer: Federal, State, Local not specified - PPO | Attending: Internal Medicine

## 2022-01-25 ENCOUNTER — Other Ambulatory Visit: Payer: Self-pay

## 2022-01-25 DIAGNOSIS — C3490 Malignant neoplasm of unspecified part of unspecified bronchus or lung: Secondary | ICD-10-CM

## 2022-01-25 LAB — CBC WITH DIFFERENTIAL (CANCER CENTER ONLY)
Abs Immature Granulocytes: 0.04 10*3/uL (ref 0.00–0.07)
Basophils Absolute: 0.1 10*3/uL (ref 0.0–0.1)
Basophils Relative: 1 %
Eosinophils Absolute: 0.2 10*3/uL (ref 0.0–0.5)
Eosinophils Relative: 2 %
HCT: 47.1 % (ref 39.0–52.0)
Hemoglobin: 14.6 g/dL (ref 13.0–17.0)
Immature Granulocytes: 0 %
Lymphocytes Relative: 16 %
Lymphs Abs: 1.6 10*3/uL (ref 0.7–4.0)
MCH: 25.3 pg — ABNORMAL LOW (ref 26.0–34.0)
MCHC: 31 g/dL (ref 30.0–36.0)
MCV: 81.5 fL (ref 80.0–100.0)
Monocytes Absolute: 0.9 10*3/uL (ref 0.1–1.0)
Monocytes Relative: 9 %
Neutro Abs: 7.4 10*3/uL (ref 1.7–7.7)
Neutrophils Relative %: 72 %
Platelet Count: 229 10*3/uL (ref 150–400)
RBC: 5.78 MIL/uL (ref 4.22–5.81)
RDW: 18.1 % — ABNORMAL HIGH (ref 11.5–15.5)
WBC Count: 10.2 10*3/uL (ref 4.0–10.5)
nRBC: 0 % (ref 0.0–0.2)

## 2022-01-25 LAB — CMP (CANCER CENTER ONLY)
ALT: 15 U/L (ref 0–44)
AST: 17 U/L (ref 15–41)
Albumin: 4.2 g/dL (ref 3.5–5.0)
Alkaline Phosphatase: 57 U/L (ref 38–126)
Anion gap: 6 (ref 5–15)
BUN: 15 mg/dL (ref 8–23)
CO2: 28 mmol/L (ref 22–32)
Calcium: 9.2 mg/dL (ref 8.9–10.3)
Chloride: 105 mmol/L (ref 98–111)
Creatinine: 1.09 mg/dL (ref 0.61–1.24)
GFR, Estimated: >60 mL/min (ref >60)
Glucose, Bld: 102 mg/dL — ABNORMAL HIGH (ref 70–99)
Potassium: 4.1 mmol/L (ref 3.5–5.1)
Sodium: 139 mmol/L (ref 135–145)
Total Bilirubin: 1.2 mg/dL (ref 0.3–1.2)
Total Protein: 6.6 g/dL (ref 6.5–8.1)

## 2022-01-25 MED ORDER — IOHEXOL 300 MG/ML  SOLN
75.0000 mL | Freq: Once | INTRAMUSCULAR | Status: AC | PRN
  Administered 2022-01-25: 75 mL via INTRAVENOUS

## 2022-01-27 ENCOUNTER — Other Ambulatory Visit: Payer: Self-pay

## 2022-01-27 ENCOUNTER — Inpatient Hospital Stay (HOSPITAL_BASED_OUTPATIENT_CLINIC_OR_DEPARTMENT_OTHER): Payer: Federal, State, Local not specified - PPO | Admitting: Internal Medicine

## 2022-01-27 VITALS — BP 137/83 | HR 92 | Temp 97.5°F | Resp 18 | Ht 70.0 in | Wt 187.2 lb

## 2022-01-27 DIAGNOSIS — Z85118 Personal history of other malignant neoplasm of bronchus and lung: Secondary | ICD-10-CM | POA: Diagnosis present

## 2022-01-27 DIAGNOSIS — Z923 Personal history of irradiation: Secondary | ICD-10-CM | POA: Insufficient documentation

## 2022-01-27 DIAGNOSIS — Z9221 Personal history of antineoplastic chemotherapy: Secondary | ICD-10-CM | POA: Insufficient documentation

## 2022-01-27 DIAGNOSIS — Z902 Acquired absence of lung [part of]: Secondary | ICD-10-CM | POA: Insufficient documentation

## 2022-01-27 DIAGNOSIS — C3490 Malignant neoplasm of unspecified part of unspecified bronchus or lung: Secondary | ICD-10-CM | POA: Diagnosis not present

## 2022-01-27 NOTE — Progress Notes (Signed)
?    [place] ?[phone]   [phone] ? ?OFFICE PROGRESS NOTE ? ?Crist Infante, MD ?[address] ?[place] [address] ? ?DIAGNOSIS AND STAGE: Stage III A (T2a, N2, M0) non-small cell lung cancer, adenocarcinoma with positive EGFR mutation in exon 19 and negative ALK gene translocation diagnosed in April 2014  ? ?PRIOR THERAPY:  ?1) Status post right upper lobectomy with lymph node dissection under the care of Dr. Servando Snare on 02/21/2013. ?2) Systemic adjuvant chemotherapy with carboplatin for AUC of 5 and Alimta 500 mg/M2 every 3 weeks. Status post 4 cycles. Last cycle was given on 05/29/2013. (Carboplatin was used in place of cisplatin secondary to concern about intolerability and significant adverse effects of cisplatin).  ?3) Adjuvant radiotherapy to the mediastinum under the care of Dr. Lisbeth Renshaw completed 08/27/2013.  ? ?CURRENT THERAPY: Observation ? ?INTERVAL HISTORY: ?Alvin Hardy 87 y.o. male returns to the clinic today for follow-up visit.  The patient is feeling fine today with no concerning complaints.  He was admitted to the hospital on June 28, 2021 with shortness of breath and fever he was diagnosed with pneumonia.  He required intensive care management and was intubated for 3 days.  He was treated with course of antibiotic as well as steroid at that time.  He also had acute osteomyelitis of the left fifth toe in February 2023 treated by Dr. Blenda Mounts.  He denied having any current chest pain, shortness of breath, cough or hemoptysis.  He has no nausea, vomiting, diarrhea or constipation.  He denied having any headache or visual changes.  He has no weight loss or night sweats.  The patient had repeat CT scan of the chest performed recently and is here for evaluation and discussion of his scan results. ? ?MEDICAL HISTORY: ?Past Medical History:  ?Diagnosis Date  ? Allergy   ? Arthritis   ? psoriatic arthritis, ? , treated /w mmethotrexate   ? Bipolar 1  disorder ([place])   ? BPH (benign prostatic hyperplasia)   ? Cataract 2010  ? Bilateral  ? Chest pain   ? Depression   ? Deviated septum   ? TO THE LEFT  ? ED (erectile dysfunction)   ? Essential hypertension 06/28/2021  ? Finger injury   ? mallet finger-- 02/13/2013, cast in place, followed by dr. Fredna Dow  ? GERD (gastroesophageal reflux disease)   ? Gilbert's syndrome   ? History of radiation therapy 07/24/13-08/27/13  ? 50Gy/25 fx chest  ? Hyperlipidemia   ? Hypogonadism male   ? Laryngopharyngeal reflux   ? lung ca dx'd 12/2012  ? rul  ? Lung mass 02/07/13  ? RIGHT UPPER LOBE  ? Morton's neuroma   ? LEFT FOOT  ? Neuromuscular disorder ([place])   ? peripheral neuropathy  ? Shortness of breath   ? voice breaks, ? related to reflux, although told by Dr. Ardis Hughs, S.- early 83 yr. old  ? ? ?ALLERGIES:  is allergic to codeine, hydrocodone-acetaminophen, omeprazole magnesium, tramadol, decadron [dexamethasone], fenofibrate, neomycin-bacitracin zn-polymyx, other, penicillin g, propoxyphene, statins, ubidecarenone, zetia [ezetimibe], and zoloft [sertraline hcl]. ? ?MEDICATIONS:  ?Current Outpatient Medications  ?Medication Sig Dispense Refill  ? albuterol (VENTOLIN HFA) 108 (90 Base) MCG/ACT inhaler Inhale 2 puffs into the lungs every 4 (four) hours as needed for shortness of breath.    ? aspirin EC 81 MG tablet Take 81 mg by mouth every morning.     ? Calcium Carbonate-Vitamin D 600-400 MG-UNIT tablet Take 1 tablet by  mouth every morning.    ? Cholecalciferol (VITAMIN D-3) 1000 UNITS CAPS Take 1,000 Units by mouth every morning.     ? clonazePAM (KLONOPIN) 0.5 MG tablet Take 1 tablet (0.5 mg total) by mouth at bedtime. 30 tablet 0  ? cyanocobalamin 1000 MCG tablet Take 1,000 mcg by mouth every morning.     ? esomeprazole (NEXIUM) 40 MG capsule Take 40 mg by mouth daily before breakfast.    ? finasteride (PROSCAR) 5 MG tablet Take 5 mg by mouth daily before breakfast.     ? furosemide (LASIX) 20 MG tablet TAKE 1 TABLET(20 MG) BY MOUTH  DAILY 90 tablet 0  ? gabapentin (NEURONTIN) 300 MG capsule Take 1,200 mg by mouth at bedtime. He takes two capsules every morning and three capsules at bedtime.    ? lithium carbonate (LITHOBID) 300 MG CR tablet Take 600 mg by mouth at bedtime.     ? Multiple Vitamin (MULTIVITAMIN WITH MINERALS) TABS tablet Take 1 tablet by mouth every morning.    ? OVER THE COUNTER MEDICATION Take 1 tablet by mouth 2 (two) times daily. Glucosamine Chondroitin 1546m/1200mg    ? potassium chloride (KLOR-CON) 10 MEQ tablet Take 10 mEq by mouth daily.    ? Probiotic Product (PROBIOTIC DAILY PO) Take 1 each by mouth daily.    ? REPATHA SURECLICK 1962MG/ML SOAJ Inject 140 mg as directed every 14 (fourteen) days.    ? tamsulosin (FLOMAX) 0.4 MG CAPS capsule Take 0.4 mg by mouth daily.  3  ? testosterone cypionate (DEPOTESTOTERONE CYPIONATE) 200 MG/ML injection Inject 0.5 mLs into the muscle every 21 ( twenty-one) days. Now 3047mml  1  ? umeclidinium-vilanterol (ANORO ELLIPTA) 62.5-25 MCG/ACT AEPB Inhale 1 puff into the lungs daily. 1 each 11  ? ?No current facility-administered medications for this visit.  ? ? ?SURGICAL HISTORY:  ?Past Surgical History:  ?Procedure Laterality Date  ? CYSTOSCOPY N/A 02/21/2013  ? Procedure: CYSTOSCOPY FLEXIBLE with insertion of foley catheter;  Surgeon: MaFredricka BonineMD;  [place] [facility];  Laterality: N/A;  ? EYE SURGERY    ? cataracts removed fr. both eyes, IOL in place   ? KNEE ARTHROSCOPY Right 01/2007  ? PILONIDAL CYST EXCISION  1960's  ? [place]  ? VIDEO ASSISTED THORACOSCOPY (VATS)/WEDGE RESECTION Right 02/21/2013  ? Procedure: Right VIDEO ASSISTED THORACOSCOPY ,Thoracotomy with right upper lobectomy, node sampling ;  Surgeon: EdGrace IsaacMD;  [facility]: Thoracic;  Laterality: Right;  ? VIDEO BRONCHOSCOPY N/A 02/21/2013  ? Procedure: VIDEO BRONCHOSCOPY;  Surgeon: EdGrace IsaacMD;  [facility];  Service: Thoracic;   Laterality: N/A;  ? ? ?REVIEW OF SYSTEMS:  A comprehensive review of systems was negative.  ? ?PHYSICAL EXAMINATION: General appearance: alert, cooperative, and no distress ?Head: Normocephalic, without obvious abnormality, atraumatic ?Neck: no adenopathy ?Lymph nodes: Cervical, supraclavicular, and axillary nodes normal. ?Resp: clear to auscultation bilaterally ?Back: symmetric, no curvature. ROM normal. No CVA tenderness. ?Cardio: regular rate and rhythm, S1, S2 normal, no murmur, click, rub or gallop ?GI: soft, non-tender; bowel sounds normal; no masses,  no organomegaly ?Extremities: extremities normal, atraumatic, no cyanosis or edema ? ?ECOG PERFORMANCE STATUS: 0 - Asymptomatic ? ?Blood pressure 137/83, pulse 92, temperature (!) 97.5 ?F (36.4 ?C), temperature source Tympanic, resp. rate 18, height '5\' 10"'  (1.778 m), weight 187 lb 3 oz (84.9 kg), SpO2 100 %. ? ?LABORATORY DATA: ?Lab Results  ?Component Value  Date  ? WBC 10.2 01/25/2022  ? HGB 14.6 01/25/2022  ? HCT 47.1 01/25/2022  ? MCV 81.5 01/25/2022  ? PLT 229 01/25/2022  ? ? ?  Chemistry   ?   ?Component Value Date/Time  ? NA 139 01/25/2022 1302  ? NA 142 01/17/2017 1125  ? K 4.1 01/25/2022 1302  ? K 4.6 01/17/2017 1125  ? CL 105 01/25/2022 1302  ? CL 107 04/24/2013 1110  ? CO2 28 01/25/2022 1302  ? CO2 26 01/17/2017 1125  ? BUN 15 01/25/2022 1302  ? BUN 13.0 01/17/2017 1125  ? CREATININE 1.09 01/25/2022 1302  ? CREATININE 1.0 01/17/2017 1125  ?    ?Component Value Date/Time  ? CALCIUM 9.2 01/25/2022 1302  ? CALCIUM 9.1 01/17/2017 1125  ? ALKPHOS 57 01/25/2022 1302  ? ALKPHOS 59 01/17/2017 1125  ? AST 17 01/25/2022 1302  ? AST 15 01/17/2017 1125  ? ALT 15 01/25/2022 1302  ? ALT 18 01/17/2017 1125  ? BILITOT 1.2 01/25/2022 1302  ? BILITOT 1.57 (H) 01/17/2017 1125  ?  ? ? ? ?RADIOGRAPHIC STUDIES: ?CT Chest W Contrast ? ?Result Date: 01/26/2022 ?CLINICAL DATA:  Primary Cancer Type: Lung Imaging Indication: Routine surveillance Interval therapy since last  imaging? No Initial Cancer Diagnosis Date: 02/21/2013; Established by: Biopsy-proven Detailed Pathology: Stage III A non-small cell lung cancer, adenocarcinoma. Primary Tumor location: Right upper lobe. Surgeries: Ri

## 2022-02-02 ENCOUNTER — Encounter: Payer: Self-pay | Admitting: Podiatry

## 2022-02-02 ENCOUNTER — Ambulatory Visit (INDEPENDENT_AMBULATORY_CARE_PROVIDER_SITE_OTHER): Payer: Federal, State, Local not specified - PPO | Admitting: Podiatry

## 2022-02-02 DIAGNOSIS — M79674 Pain in right toe(s): Secondary | ICD-10-CM

## 2022-02-02 DIAGNOSIS — M79675 Pain in left toe(s): Secondary | ICD-10-CM

## 2022-02-02 DIAGNOSIS — B351 Tinea unguium: Secondary | ICD-10-CM

## 2022-02-02 DIAGNOSIS — Z899 Acquired absence of limb, unspecified: Secondary | ICD-10-CM | POA: Diagnosis not present

## 2022-02-02 DIAGNOSIS — Z9889 Other specified postprocedural states: Secondary | ICD-10-CM

## 2022-02-02 NOTE — Progress Notes (Signed)
?Subjective:  ?Patient ID: Alvin Hardy, male    DOB: 03/24/1935,  MRN: [id number] ? ?No chief complaint on file. ? ? ?DOS: 12/15/21  ?Procedure: Left partial fifth digit amputation ? ?87 y.o. male returns for POV#3. Relates he is doing well without much pain. Hoping to have nails trimmed today as well.  ? ?Review of Systems: Negative except as noted in the HPI. Denies N/V/F/Ch. ? ?Past Medical History:  ?Diagnosis Date  ? Allergy   ? Arthritis   ? psoriatic arthritis, ? , treated /w mmethotrexate   ? Bipolar 1 disorder ([place])   ? BPH (benign prostatic hyperplasia)   ? Cataract 2010  ? Bilateral  ? Chest pain   ? Depression   ? Deviated septum   ? TO THE LEFT  ? ED (erectile dysfunction)   ? Essential hypertension 06/28/2021  ? Finger injury   ? mallet finger-- 02/13/2013, cast in place, followed by dr. Fredna Dow  ? GERD (gastroesophageal reflux disease)   ? Gilbert's syndrome   ? History of radiation therapy 07/24/13-08/27/13  ? 50Gy/25 fx chest  ? Hyperlipidemia   ? Hypogonadism male   ? Laryngopharyngeal reflux   ? lung ca dx'd 12/2012  ? rul  ? Lung mass 02/07/13  ? RIGHT UPPER LOBE  ? Morton's neuroma   ? LEFT FOOT  ? Neuromuscular disorder ([place])   ? peripheral neuropathy  ? Shortness of breath   ? voice breaks, ? related to reflux, although told by Dr. Ardis Hughs, S.- early 69 yr. old  ? ? ?Current Outpatient Medications:  ?  albuterol (VENTOLIN HFA) 108 (90 Base) MCG/ACT inhaler, Inhale 2 puffs into the lungs every 4 (four) hours as needed for shortness of breath., Disp: , Rfl:  ?  aspirin EC 81 MG tablet, Take 81 mg by mouth every morning. , Disp: , Rfl:  ?  Calcium Carbonate-Vitamin D 600-400 MG-UNIT tablet, Take 1 tablet by mouth every morning., Disp: , Rfl:  ?  Cholecalciferol (VITAMIN D-3) 1000 UNITS CAPS, Take 1,000 Units by mouth every morning. , Disp: , Rfl:  ?  clonazePAM (KLONOPIN) 0.5 MG tablet, Take 1 tablet (0.5 mg total) by mouth at bedtime., Disp: 30 tablet, Rfl: 0 ?  cyanocobalamin 1000 MCG tablet, Take  1,000 mcg by mouth every morning. , Disp: , Rfl:  ?  esomeprazole (NEXIUM) 40 MG capsule, Take 40 mg by mouth daily before breakfast., Disp: , Rfl:  ?  finasteride (PROSCAR) 5 MG tablet, Take 5 mg by mouth daily before breakfast. , Disp: , Rfl:  ?  furosemide (LASIX) 20 MG tablet, TAKE 1 TABLET(20 MG) BY MOUTH DAILY, Disp: 90 tablet, Rfl: 0 ?  gabapentin (NEURONTIN) 300 MG capsule, Take 1,200 mg by mouth at bedtime. He takes two capsules every morning and three capsules at bedtime., Disp: , Rfl:  ?  lithium carbonate (LITHOBID) 300 MG CR tablet, Take 600 mg by mouth at bedtime. , Disp: , Rfl:  ?  Multiple Vitamin (MULTIVITAMIN WITH MINERALS) TABS tablet, Take 1 tablet by mouth every morning., Disp: , Rfl:  ?  OVER THE COUNTER MEDICATION, Take 1 tablet by mouth 2 (two) times daily. Glucosamine Chondroitin 1500mg /1200mg , Disp: , Rfl:  ?  potassium chloride (KLOR-CON) 10 MEQ tablet, Take 10 mEq by mouth daily., Disp: , Rfl:  ?  Probiotic Product (PROBIOTIC DAILY PO), Take 1 each by mouth daily., Disp: , Rfl:  ?  REPATHA SURECLICK 588 MG/ML SOAJ, Inject 140 mg as directed every 14 (fourteen) days.,  Disp: , Rfl:  ?  tamsulosin (FLOMAX) 0.4 MG CAPS capsule, Take 0.4 mg by mouth daily., Disp: , Rfl: 3 ?  testosterone cypionate (DEPOTESTOTERONE CYPIONATE) 200 MG/ML injection, Inject 0.5 mLs into the muscle every 21 ( twenty-one) days. Now 300mg /ml, Disp: , Rfl: 1 ?  umeclidinium-vilanterol (ANORO ELLIPTA) 62.5-25 MCG/ACT AEPB, Inhale 1 puff into the lungs daily. (Patient not taking: Reported on 01/27/2022), Disp: 1 each, Rfl: 11 ? ?Social History  ? ?Tobacco Use  ?Smoking Status Former  ? Packs/day: 0.50  ? Years: 24.00  ? Pack years: 12.00  ? Types: Cigarettes, Pipe  ? Quit date: 11/01/1978  ? Years since quitting: 43.2  ?Smokeless Tobacco Never  ? ? ?Allergies  ?Allergen Reactions  ? Codeine Anaphylaxis  ? Hydrocodone-Acetaminophen   ?  Other reaction(s): Other (See Comments) ?Reactions unknown  ? Omeprazole Magnesium   ?   Other reaction(s): Other (See Comments) ?Reaction Unknown  ? Tramadol   ?  Other reaction(s): Other (See Comments) ?Reactions Unknown  ? Decadron [Dexamethasone]   ?  Pt says it is contraindication due to the lithium he is taking  ? Fenofibrate   ?  Other reaction(s): Other (See Comments) ?Reactions Unknown  ? Neomycin-Bacitracin Zn-Polymyx   ?  Other reaction(s): Other (See Comments) ?Reactions Unknown  ? Other   ?  Pt. Remarks that all pain med. Make him nauseated   ? Penicillin G   ?  Other reaction(s): Other (See Comments) ?Reactions Unknown  ? Propoxyphene   ?  Other reaction(s): Other (See Comments) ?Reaction Unknown  ? Statins   ?  Lipitor, Crestor, Zocor all caused muscle aches  ? Ubidecarenone   ?  Other reaction(s): Other (See Comments) ?Reactions Unknown  ? Zetia [Ezetimibe]   ?  Muscle aches  ? Zoloft [Sertraline Hcl] Other (See Comments)  ?  REACTION:  unknown  ? ?Objective:  ?There were no vitals filed for this visit. ?There is no height or weight on file to calculate BMI. ?Constitutional Well developed. ?Well nourished.  ?Vascular Foot warm and well perfused. ?Capillary refill normal to all digits.   ?Neurologic Normal speech. ?Oriented to person, place, and time. ?Epicritic sensation to light touch grossly present bilaterally.  ?Dermatologic Skin healing well without signs of infection. Skin edges well coapted without signs of infection. Nails 1-5 bilateral thickened welongated and discolored.   ?Orthopedic: Tenderness to palpation noted about the surgical site.  ? ?Radiographs: Partial amputation of fifth digit. No new osseous erosions  ?Assessment:  ? ?1. Post-operative state   ?2. History of amputation   ?3. Pain due to onychomycosis of toenails of both feet   ? ?Plan:  ?Patient was evaluated and treated and all questions answered. ? ?S/p foot surgery left ?-Progressing as expected post-operatively. ?-WB Status: WBAT in regular shoes.   ?-Medications: n/a ?-Discussed and educated patient on foot  care, especially with  ?regards to the vascular, neurological and musculoskeletal systems.  ?-Discussed supportive shoes at all times and checking feet regularly.  ?-Mechanically debrided all nails 1-5 bilateral using sterile nail nipper and filed with dremel without incident  ?-Answered all patient questions ?-Patient to return  in 3 months for at risk foot care ?-Patient advised to call the office if any problems or questions arise in the meantime. ? ? ?Return in about 3 months (around 05/04/2022) for rfc.  ? ?

## 2022-02-07 NOTE — Progress Notes (Signed)
? ? ?Synopsis: Referred for COPD by Crist Infante, MD ? ?Subjective:  ? ?PATIENT ID: Alvin Hardy GENDER: male DOB: 22-May-1935, MRN: [id number] ? ?Chief Complaint  ?Patient presents with  ? Follow-up  ?  PFT's done today. Breathing has improved some since the last visit.    ? ?87yM with COPD previously followed by Dr. Lamonte Sakai, stage IIIa RUL adenoCA s/p lobectomy, chemoXRT, psoriatic arthritis previously on methotrexate 2016, remote smoking pipes mostly - quit in 1995 30 years or so, radiation pneumonitis in 2014, GERD, LPR ? ?06/28/21 had admission to [facility] for multifocal pneumonia, AECOPD requiring intubation and ICU admission, course c/b encephalopathy that cleared, urinary retention. Discharged to SNF. Has been home for a couple weeks.  ? ?He is taking dulera 2 puffs twice daily, forgets to rinse his mouth. He had some DOE prior to 8/28 admission. At this point he feels like tires easily - not necessarily due to dyspnea. He says walking he feels more limited by leg weakness. He has no substantial cough productive or otherwise. No CP. He doesn't think he has any overt issues with solid or liquid food dysphagia/aspiration - speech eval was ok after encephalopathy cleared.  ? ?He has no family history of lung disease. ? 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 ? ?Interval HPI: ?Having gradual improvement in DOE, no cough.  ? ? ?Otherwise pertinent review of systems is negative. ? ?Past Medical History:  ?Diagnosis Date  ? Allergy   ? Arthritis   ? psoriatic arthritis, ? , treated /w mmethotrexate   ? Bipolar 1 disorder ([place])   ? BPH (benign prostatic hyperplasia)   ? Cataract 2010  ? Bilateral  ? Chest pain   ? Depression   ? Deviated septum   ? TO THE LEFT  ? ED (erectile dysfunction)   ? Essential hypertension 06/28/2021  ? Finger injury   ? mallet finger-- 02/13/2013, cast in place, followed by dr. Fredna Dow  ? GERD (gastroesophageal reflux disease)   ? Gilbert's syndrome   ?  History of radiation therapy 07/24/13-08/27/13  ? 50Gy/25 fx chest  ? Hyperlipidemia   ? Hypogonadism male   ? Laryngopharyngeal reflux   ? lung ca dx'd 12/2012  ? rul  ? Lung mass 02/07/13  ? RIGHT UPPER LOBE  ? Morton's neuroma   ? LEFT FOOT  ? Neuromuscular disorder ([place])   ? peripheral neuropathy  ? Shortness of breath   ? voice breaks, ? related to reflux, although told by Dr. Ardis Hughs, S.- early 87 yr. old  ?  ? ?Family History  ?Problem Relation Age of Onset  ? Diabetes Mother   ? Cancer Mother   ?     abdomen  ? Heart disease Father   ? Diabetes Son   ? Cancer Maternal Aunt   ?     lung ca, smoker  ?  ? ?Past Surgical History:  ?Procedure Laterality Date  ? CYSTOSCOPY N/A 02/21/2013  ? Procedure: CYSTOSCOPY FLEXIBLE with insertion of foley catheter;  Surgeon: Fredricka Bonine, MD;  Location: [facility];  Service: Urology;  Laterality: N/A;  ? EYE SURGERY    ? cataracts removed fr. both eyes, IOL in place   ? KNEE ARTHROSCOPY Right 01/2007  ? PILONIDAL CYST EXCISION  1960's  ? [place]  ? TURP  ? VIDEO ASSISTED THORACOSCOPY (VATS)/WEDGE RESECTION Right 02/21/2013  ? Procedure: Right VIDEO ASSISTED THORACOSCOPY ,Thoracotomy with right upper lobectomy,  node sampling ;  Surgeon: Grace Isaac, MD;  Location: [facility];  Service: Thoracic;  Laterality: Right;  ? VIDEO BRONCHOSCOPY N/A 02/21/2013  ? Procedure: VIDEO BRONCHOSCOPY;  Surgeon: Grace Isaac, MD;  Location: [facility];  Service: Thoracic;  Laterality: N/A;  ? ? ?Social History  ? ?Socioeconomic History  ? Marital status: Married  ?  Spouse name: Not on file  ? Number of children: Not on file  ? Years of education: Not on file  ? Highest education level: Not on file  ?Occupational History  ? Not on file  ?Tobacco Use  ? Smoking status: Former  ?  Packs/day: 0.50  ?  Years: 24.00  ?  Pack years: 12.00  ?  Types: Cigarettes, Pipe  ?  Quit date: 11/01/1978  ?  Years since quitting: 43.3  ? Smokeless tobacco: Never  ?Vaping Use  ? Vaping Use: Never used   ?Substance and Sexual Activity  ? Alcohol use: Yes  ?  Comment: occas.  ? Drug use: No  ? Sexual activity: Not on file  ?Other Topics Concern  ? Not on file  ?Social History Narrative  ? Not on file  ? ?Social Determinants of Health  ? ?Financial Resource Strain: Not on file  ?Food Insecurity: Not on file  ?Transportation Needs: Not on file  ?Physical Activity: Not on file  ?Stress: Not on file  ?Social Connections: Not on file  ?Intimate Partner Violence: Not on file  ?  ? ?Allergies  ?Allergen Reactions  ? Codeine Anaphylaxis  ? Hydrocodone-Acetaminophen   ?  Other reaction(s): Other (See Comments) ?Reactions unknown  ? Omeprazole Magnesium   ?  Other reaction(s): Other (See Comments) ?Reaction Unknown  ? Tramadol   ?  Other reaction(s): Other (See Comments) ?Reactions Unknown  ? Decadron [Dexamethasone]   ?  Pt says it is contraindication due to the lithium he is taking  ? Fenofibrate   ?  Other reaction(s): Other (See Comments) ?Reactions Unknown  ? Neomycin-Bacitracin Zn-Polymyx   ?  Other reaction(s): Other (See Comments) ?Reactions Unknown  ? Other   ?  Pt. Remarks that all pain med. Make him nauseated   ? Penicillin G   ?  Other reaction(s): Other (See Comments) ?Reactions Unknown  ? Propoxyphene   ?  Other reaction(s): Other (See Comments) ?Reaction Unknown  ? Statins   ?  Lipitor, Crestor, Zocor all caused muscle aches  ? Ubidecarenone   ?  Other reaction(s): Other (See Comments) ?Reactions Unknown  ? Zetia [Ezetimibe]   ?  Muscle aches  ? Zoloft [Sertraline Hcl] Other (See Comments)  ?  REACTION:  unknown  ?  ? ?Outpatient Medications Prior to Visit  ?Medication Sig Dispense Refill  ? albuterol (VENTOLIN HFA) 108 (90 Base) MCG/ACT inhaler Inhale 2 puffs into the lungs every 6 (six) hours as needed for wheezing or shortness of breath.    ? aspirin EC 81 MG tablet Take 81 mg by mouth every morning.     ? Calcium Carbonate-Vitamin D 600-400 MG-UNIT tablet Take 1 tablet by mouth every morning.    ?  Cholecalciferol (VITAMIN D-3) 1000 UNITS CAPS Take 1,000 Units by mouth every morning.     ? clonazePAM (KLONOPIN) 0.5 MG tablet Take 1 tablet (0.5 mg total) by mouth at bedtime. 30 tablet 0  ? cyanocobalamin 1000 MCG tablet Take 1,000 mcg by mouth every morning.     ? esomeprazole (NEXIUM) 40 MG capsule Take 40 mg by mouth  daily before breakfast.    ? finasteride (PROSCAR) 5 MG tablet Take 5 mg by mouth daily before breakfast.     ? furosemide (LASIX) 20 MG tablet TAKE 1 TABLET(20 MG) BY MOUTH DAILY 90 tablet 0  ? gabapentin (NEURONTIN) 300 MG capsule Take 1,200 mg by mouth at bedtime. He takes two capsules every morning and three capsules at bedtime.    ? lithium carbonate (LITHOBID) 300 MG CR tablet Take 600 mg by mouth at bedtime.     ? Multiple Vitamin (MULTIVITAMIN WITH MINERALS) TABS tablet Take 1 tablet by mouth every morning.    ? OVER THE COUNTER MEDICATION Take 1 tablet by mouth 2 (two) times daily. Glucosamine Chondroitin 1500mg /1200mg     ? potassium chloride (KLOR-CON) 10 MEQ tablet Take 10 mEq by mouth daily.    ? Probiotic Product (PROBIOTIC DAILY PO) Take 1 each by mouth daily.    ? REPATHA SURECLICK 361 MG/ML SOAJ Inject 140 mg as directed every 14 (fourteen) days.    ? tamsulosin (FLOMAX) 0.4 MG CAPS capsule Take 0.4 mg by mouth daily.  3  ? testosterone cypionate (DEPOTESTOTERONE CYPIONATE) 200 MG/ML injection Inject 0.5 mLs into the muscle every 21 ( twenty-one) days. Now 300mg /ml  1  ? albuterol (VENTOLIN HFA) 108 (90 Base) MCG/ACT inhaler Inhale 2 puffs into the lungs every 4 (four) hours as needed for shortness of breath.    ? umeclidinium-vilanterol (ANORO ELLIPTA) 62.5-25 MCG/ACT AEPB Inhale 1 puff into the lungs daily. 1 each 11  ? ?No facility-administered medications prior to visit.  ? ? ? ? ? ?Objective:  ? ?Physical Exam: ?General appearance: 87 y.o., male, NAD, conversant  ?Eyes: anicteric sclerae; PERRL, tracking appropriately ?HENT: NCAT; MMM ?Neck: Trachea midline; no  lymphadenopathy, no JVD ?Lungs: CTAB, no crackles, no wheeze, with normal respiratory effort ?CV: RRR, no murmur  ?Abdomen: Soft, non-tender; non-distended, BS present  ?Extremities: No peripheral edema, warm ?Skin: Normal

## 2022-02-09 ENCOUNTER — Ambulatory Visit: Payer: Federal, State, Local not specified - PPO | Admitting: Student

## 2022-02-09 ENCOUNTER — Ambulatory Visit (INDEPENDENT_AMBULATORY_CARE_PROVIDER_SITE_OTHER): Payer: Federal, State, Local not specified - PPO | Admitting: Student

## 2022-02-09 ENCOUNTER — Encounter: Payer: Self-pay | Admitting: Student

## 2022-02-09 VITALS — BP 116/64 | HR 89 | Temp 98.1°F | Ht 69.0 in | Wt 185.0 lb

## 2022-02-09 DIAGNOSIS — R0609 Other forms of dyspnea: Secondary | ICD-10-CM

## 2022-02-09 DIAGNOSIS — J701 Chronic and other pulmonary manifestations due to radiation: Secondary | ICD-10-CM

## 2022-02-09 LAB — PULMONARY FUNCTION TEST
DL/VA % pred: 90 %
DL/VA: 3.48 ml/min/mmHg/L
DLCO cor % pred: 74 %
DLCO cor: 17.3 ml/min/mmHg
DLCO unc % pred: 74 %
DLCO unc: 17.3 ml/min/mmHg
FEF 25-75 Post: 1.13 L/sec
FEF 25-75 Pre: 1.2 L/sec
FEF2575-%Change-Post: -6 %
FEF2575-%Pred-Post: 66 %
FEF2575-%Pred-Pre: 71 %
FEV1-%Change-Post: 0 %
FEV1-%Pred-Post: 82 %
FEV1-%Pred-Pre: 81 %
FEV1-Post: 2.13 L
FEV1-Pre: 2.11 L
FEV1FVC-%Change-Post: -1 %
FEV1FVC-%Pred-Pre: 93 %
FEV6-%Change-Post: 2 %
FEV6-%Pred-Post: 93 %
FEV6-%Pred-Pre: 90 %
FEV6-Post: 3.19 L
FEV6-Pre: 3.09 L
FEV6FVC-%Change-Post: 0 %
FEV6FVC-%Pred-Post: 105 %
FEV6FVC-%Pred-Pre: 104 %
FVC-%Change-Post: 2 %
FVC-%Pred-Post: 88 %
FVC-%Pred-Pre: 86 %
FVC-Post: 3.27 L
FVC-Pre: 3.2 L
Post FEV1/FVC ratio: 65 %
Post FEV6/FVC ratio: 97 %
Pre FEV1/FVC ratio: 66 %
Pre FEV6/FVC Ratio: 97 %
RV % pred: 93 %
RV: 2.5 L
TLC % pred: 82 %
TLC: 5.66 L

## 2022-02-09 NOTE — Progress Notes (Signed)
PFT done today. 

## 2022-02-09 NOTE — Patient Instructions (Signed)
-   If worsening shortness of breath with exertion limiting your activities, bothersome cough, or if any new concerning abnormalities on surveillance CT imaging I'd be glad to see you in clinic again. Just send message or call. ?

## 2022-04-06 ENCOUNTER — Ambulatory Visit: Payer: Federal, State, Local not specified - PPO | Admitting: Cardiovascular Disease

## 2022-04-06 ENCOUNTER — Encounter: Payer: Self-pay | Admitting: Cardiovascular Disease

## 2022-04-06 DIAGNOSIS — I739 Peripheral vascular disease, unspecified: Secondary | ICD-10-CM | POA: Diagnosis not present

## 2022-04-06 DIAGNOSIS — E782 Mixed hyperlipidemia: Secondary | ICD-10-CM | POA: Diagnosis not present

## 2022-04-06 DIAGNOSIS — I10 Essential (primary) hypertension: Secondary | ICD-10-CM

## 2022-04-06 DIAGNOSIS — R600 Localized edema: Secondary | ICD-10-CM | POA: Diagnosis not present

## 2022-04-06 NOTE — Assessment & Plan Note (Signed)
Mild bilateral lower extreme edema on low-dose Gypsy Balsam.

## 2022-04-06 NOTE — Assessment & Plan Note (Addendum)
Lower extremity arterial Doppler studies performed  02/13/14 were entirely normal.  He has 2+ pedal pulses on exam.

## 2022-04-06 NOTE — Patient Instructions (Signed)

## 2022-04-06 NOTE — Assessment & Plan Note (Signed)
History of hyperlipidemia on Repatha with lipid profile performed 08/20/2021 revealing total cholesterol of 146, LDL 73 and HDL 44.

## 2022-04-06 NOTE — Assessment & Plan Note (Signed)
History of essential hypertension with blood pressure measured today at 128/70.  He is currently not on antihypertensive medications.

## 2022-04-06 NOTE — Progress Notes (Signed)
04/06/2022 [name]   06-24-1935  [id number]  Primary Physician Crist Infante, MD Primary Cardiologist: Lorretta Harp MD Lupe Carney, [place]  HPI:  Alvin Hardy is a 87 y.o.  fit-appearing married Caucasian male father of 2 children, grandfather of 2 grandchildren referred by Dr. Crist Infante for cardiovascular evaluation.  I last saw him in the office 10/07/2021.  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 His cardiovascular risk factor profile is markable for hyperlipidemia intolerant to statin drugs and family history of heart disease with a father who had a myocardial infarction at age 55. He is nonhypertensive, diabetic nor has she smoked in the in the past. He has never had a heart attack or stroke. He was getting occasional chest pain that occurred both with exertion as well as at rest and after eating. He has had lung cancer and has had a VATS procedure by Dr. Servando Snare of the right upper lobe along with chemotherapy and radiation therapy. He has seen a gastroenterologist and has had EGD and is going to be referred to Dr. Malvin Johns for pulmonary evaluation. A Myoview stress test was performed that was entirely normal as were her lower extremity Doppler studies. Because of the statin intolerance he was recently begun on Repatha by Dr. Joylene Draft the marked reduction in his cholesterol levels. Over the last 6 weeks or so he's had progressive increase in frequency and severity of exertional chest pressure as well as dyspnea on exertion.  He did have a 2D echo and Myoview stress test which were essentially normal.  The chest pain which he was experiencing when I saw him last has resolved.  He was hospitalized for pneumonia from the end of August to the end of September (5 weeks.  He went to [facility] for several weeks and now is home.  He has noticed some left lower extremities edema on furosemide.  A 2D echocardiogram performed 06/30/2021 revealed a slight decline in LV function with an EF  of 45 to 50% with grade 2 diastolic dysfunction.  There are no valvular abnormalities noted.  Since I saw him 6 months ago he is slowly recovering from his episode of pneumonia with prolonged hospitalization.  He no longer has dyspnea.  He has mild lower extremity edema.  He does complain of symptoms compatible with peripheral neuropathy in his lower extremities.   Current Meds  Medication Sig   aspirin EC 81 MG tablet Take 81 mg by mouth every morning.    Calcium Carbonate-Vitamin D 600-400 MG-UNIT tablet Take 1 tablet by mouth every morning.   Cholecalciferol (VITAMIN D-3) 1000 UNITS CAPS Take 1,000 Units by mouth every morning.    clonazePAM (KLONOPIN) 0.5 MG tablet Take 1 tablet (0.5 mg total) by mouth at bedtime.   cyanocobalamin 1000 MCG tablet Take 1,000 mcg by mouth every morning.    esomeprazole (NEXIUM) 40 MG capsule Take 40 mg by mouth daily before breakfast.   finasteride (PROSCAR) 5 MG tablet Take 5 mg by mouth daily before breakfast.    furosemide (LASIX) 20 MG tablet TAKE 1 TABLET(20 MG) BY MOUTH DAILY   gabapentin (NEURONTIN) 300 MG capsule Take 1,200 mg by mouth at bedtime. He takes two capsules every morning and three capsules at bedtime.   lithium carbonate (LITHOBID) 300 MG CR tablet Take 600 mg by mouth at bedtime.    Multiple Vitamin (MULTIVITAMIN WITH MINERALS) TABS tablet Take 1 tablet by mouth every morning.   OVER THE COUNTER  MEDICATION Take 1 tablet by mouth 2 (two) times daily. Glucosamine Chondroitin 1500mg /1200mg    potassium chloride (KLOR-CON) 10 MEQ tablet Take 10 mEq by mouth daily.   Probiotic Product (PROBIOTIC DAILY PO) Take 1 each by mouth daily.   REPATHA SURECLICK 518 MG/ML SOAJ Inject 140 mg as directed every 14 (fourteen) days.   tamsulosin (FLOMAX) 0.4 MG CAPS capsule Take 0.4 mg by mouth daily.   testosterone cypionate (DEPOTESTOTERONE CYPIONATE) 200 MG/ML injection Inject 0.5 mLs into the muscle every 21 ( twenty-one) days. Now 300mg /ml      Allergies  Allergen Reactions   Codeine Anaphylaxis   Hydrocodone-Acetaminophen     Other reaction(s): Other (See Comments) Reactions unknown   Omeprazole Magnesium     Other reaction(s): Other (See Comments) Reaction Unknown   Tramadol     Other reaction(s): Other (See Comments) Reactions Unknown   Decadron [Dexamethasone]     Pt says it is contraindication due to the lithium he is taking   Fenofibrate     Other reaction(s): Other (See Comments) Reactions Unknown   Neomycin-Bacitracin Zn-Polymyx     Other reaction(s): Other (See Comments) Reactions Unknown   Other     Pt. Remarks that all pain med. Make him nauseated    Penicillin G     Other reaction(s): Other (See Comments) Reactions Unknown   Propoxyphene     Other reaction(s): Other (See Comments) Reaction Unknown   Statins     Lipitor, Crestor, Zocor all caused muscle aches   Ubidecarenone     Other reaction(s): Other (See Comments) Reactions Unknown   Zetia [Ezetimibe]     Muscle aches   Zoloft [Sertraline Hcl] Other (See Comments)    REACTION:  unknown    Social History   Socioeconomic History   Marital status: Married    Spouse name: Not on file   Number of children: Not on file   Years of education: Not on file   Highest education level: Not on file  Occupational History   Not on file  Tobacco Use   Smoking status: Former    Packs/day: 0.50    Years: 24.00    Pack years: 12.00    Types: Cigarettes, Pipe    Quit date: 11/01/1978    Years since quitting: 43.4   Smokeless tobacco: Never  Vaping Use   Vaping Use: Never used  Substance and Sexual Activity   Alcohol use: Yes    Comment: occas.   Drug use: No   Sexual activity: Not on file  Other Topics Concern   Not on file  Social History Narrative   Not on file   Social Determinants of Health   Financial Resource Strain: Not on file  Food Insecurity: Not on file  Transportation Needs: Not on file  Physical Activity: Not on file   Stress: Not on file  Social Connections: Not on file  Intimate Partner Violence: Not on file     Review of Systems: General: negative for chills, fever, night sweats or weight changes.  Cardiovascular: negative for chest pain, dyspnea on exertion, edema, orthopnea, palpitations, paroxysmal nocturnal dyspnea or shortness of breath Dermatological: negative for rash Respiratory: negative for cough or wheezing Urologic: negative for hematuria Abdominal: negative for nausea, vomiting, diarrhea, bright red blood per rectum, melena, or hematemesis Neurologic: negative for visual changes, syncope, or dizziness All other systems reviewed and are otherwise negative except as noted above.    Blood pressure 128/70, pulse 97, height 5\' 10"  (1.778 m), weight  190 lb 3.2 oz (86.3 kg), SpO2 95 %.  General appearance: alert and no distress Neck: no adenopathy, no carotid bruit, no JVD, supple, symmetrical, trachea midline, and thyroid not enlarged, symmetric, no tenderness/mass/nodules Lungs: clear to auscultation bilaterally Heart: regular rate and rhythm, S1, S2 normal, no murmur, click, rub or gallop Extremities: extremities normal, atraumatic, no cyanosis or edema Pulses: 2+ and symmetric Skin: Skin color, texture, turgor normal. No rashes or lesions Neurologic: Grossly normal  EKG sinus rhythm at 97 with right bundle branch block and left axis deviation.  I personally reviewed this EKG.  ASSESSMENT AND PLAN:   Hyperlipidemia History of hyperlipidemia on Repatha with lipid profile performed 08/20/2021 revealing total cholesterol of 146, LDL 73 and HDL 44.  Claudication [facility]) Lower extremity arterial Doppler studies performed  02/13/14 were entirely normal.  He has 2+ pedal pulses on exam.  Essential hypertension History of essential hypertension with blood pressure measured today at 128/70.  He is currently not on antihypertensive medications.  Bilateral lower extremity edema Mild  bilateral lower extreme edema on low-dose Gypsy Balsam.     Lorretta Harp MD FACP,FACC,FAHA, FSCAI 04/06/2022 1:32 PM

## 2022-04-08 ENCOUNTER — Ambulatory Visit: Payer: Federal, State, Local not specified - PPO | Admitting: Podiatry

## 2022-04-08 ENCOUNTER — Encounter: Payer: Self-pay | Admitting: Podiatry

## 2022-04-08 DIAGNOSIS — M79675 Pain in left toe(s): Secondary | ICD-10-CM

## 2022-04-08 DIAGNOSIS — M79674 Pain in right toe(s): Secondary | ICD-10-CM

## 2022-04-08 DIAGNOSIS — Z899 Acquired absence of limb, unspecified: Secondary | ICD-10-CM

## 2022-04-08 DIAGNOSIS — L84 Corns and callosities: Secondary | ICD-10-CM

## 2022-04-08 DIAGNOSIS — B351 Tinea unguium: Secondary | ICD-10-CM

## 2022-04-08 NOTE — Progress Notes (Signed)
  Subjective:  Patient ID: Alvin Hardy, male    DOB: 08/29/1935,   MRN: [id number]  Chief Complaint  Patient presents with   Callouses    Callus on the ball of the left foot and the 5th toe left has a hard skin or nail   Nail Problem    Trim nails     87 y.o. male presents for concern of thickened elongated and painful nails that are difficult to trim. Requesting to have them trimmed today. Relates burning and tingling in their feet. History of left partial fifth digit amputation  Patient is diabetic and last A1c was  Lab Results  Component Value Date   HGBA1C 5.7 (H) 07/02/2021   .   PCP:  Crist Infante, MD    . Denies any other pedal complaints. Denies n/v/f/c.   Past Medical History:  Diagnosis Date   Allergy    Arthritis    psoriatic arthritis, ? , treated /w mmethotrexate    Bipolar 1 disorder ([place])    BPH (benign prostatic hyperplasia)    Cataract 2010   Bilateral   Chest pain    Depression    Deviated septum    TO THE LEFT   ED (erectile dysfunction)    Essential hypertension 06/28/2021   Finger injury    mallet finger-- 02/13/2013, cast in place, followed by dr. Fredna Dow   GERD (gastroesophageal reflux disease)    Gilbert's syndrome    History of radiation therapy 07/24/13-08/27/13   50Gy/25 fx chest   Hyperlipidemia    Hypogonadism male    Laryngopharyngeal reflux    lung ca dx'd 12/2012   rul   Lung mass 02/07/13   RIGHT UPPER LOBE   Morton's neuroma    LEFT FOOT   Neuromuscular disorder ([place])    peripheral neuropathy   Shortness of breath    voice breaks, ? related to reflux, although told by Dr. Ardis Hughs, S.- early 47 yr. old    Objective:  Physical Exam: Vascular: DP/PT pulses 2/4 bilateral. CFT <3 seconds. Absent hair growth on digits. Edema noted to bilateral lower extremities. Xerosis noted bilaterally.  Skin. No lacerations or abrasions bilateral feet. Nails 1-5 bilateral  are thickened discolored and elongated with subungual debris. Hyperkeratotic  tissue noted over fifth left digit.  Musculoskeletal: MMT 5/5 bilateral lower extremities in DF, PF, Inversion and Eversion. Deceased ROM in DF of ankle joint. Partial fifth digit amputation  Neurological: Sensation intact to light touch. Protective sensation diminished bilateral.    Assessment:   1. History of amputation   2. Pain due to onychomycosis of toenails of both feet      Plan:  Patient was evaluated and treated and all questions answered. -Discussed and educated patient on foot care, especially with  regards to the vascular, neurological and musculoskeletal systems.  -Discussed supportive shoes at all times and checking feet regularly.  -Mechanically debrided all nails 1-5 bilateral using sterile nail nipper and filed with dremel without incident  -Answered all patient questions -Hyperkeratotic tissue trimmed without incident -Patient to return  in 3 months for at risk foot care -Patient advised to call the office if any problems or questions arise in the meantime.    Lorenda Peck, DPM

## 2022-04-15 ENCOUNTER — Ambulatory Visit: Payer: Federal, State, Local not specified - PPO | Admitting: Podiatry

## 2022-05-12 ENCOUNTER — Encounter: Payer: Self-pay | Admitting: Podiatry

## 2022-05-12 ENCOUNTER — Ambulatory Visit: Payer: Federal, State, Local not specified - PPO | Admitting: Podiatry

## 2022-05-12 DIAGNOSIS — M79674 Pain in right toe(s): Secondary | ICD-10-CM

## 2022-05-12 DIAGNOSIS — Z899 Acquired absence of limb, unspecified: Secondary | ICD-10-CM | POA: Diagnosis not present

## 2022-05-12 DIAGNOSIS — M79675 Pain in left toe(s): Secondary | ICD-10-CM

## 2022-05-12 DIAGNOSIS — L84 Corns and callosities: Secondary | ICD-10-CM

## 2022-05-12 DIAGNOSIS — B351 Tinea unguium: Secondary | ICD-10-CM | POA: Diagnosis not present

## 2022-05-12 NOTE — Progress Notes (Signed)
  Subjective:  Patient ID: Alvin Hardy, male    DOB: Oct 28, 1935,   MRN: [id number]  Chief Complaint  Patient presents with   Nail Problem     Routine foot care    87 y.o. male presents for concern of thickened elongated and painful nails that are difficult to trim. Requesting to have them trimmed today. Relates burning and tingling in their feet. History of left partial fifth digit amputation and pre-ulcerative callus in that area.   Patient is diabetic and last A1c was  Lab Results  Component Value Date   HGBA1C 5.7 (H) 07/02/2021   .   PCP:  Crist Infante, MD    . Denies any other pedal complaints. Denies n/v/f/c.   Past Medical History:  Diagnosis Date   Allergy    Arthritis    psoriatic arthritis, ? , treated /w mmethotrexate    Bipolar 1 disorder ([place])    BPH (benign prostatic hyperplasia)    Cataract 2010   Bilateral   Chest pain    Depression    Deviated septum    TO THE LEFT   ED (erectile dysfunction)    Essential hypertension 06/28/2021   Finger injury    mallet finger-- 02/13/2013, cast in place, followed by dr. Fredna Dow   GERD (gastroesophageal reflux disease)    Gilbert's syndrome    History of radiation therapy 07/24/13-08/27/13   50Gy/25 fx chest   Hyperlipidemia    Hypogonadism male    Laryngopharyngeal reflux    lung ca dx'd 12/2012   rul   Lung mass 02/07/13   RIGHT UPPER LOBE   Morton's neuroma    LEFT FOOT   Neuromuscular disorder ([place])    peripheral neuropathy   Shortness of breath    voice breaks, ? related to reflux, although told by Dr. Ardis Hughs, S.- early 13 yr. old    Objective:  Physical Exam: Vascular: DP/PT pulses 2/4 bilateral. CFT <3 seconds. Absent hair growth on digits. Edema noted to bilateral lower extremities. Xerosis noted bilaterally.  Skin. No lacerations or abrasions bilateral feet. Nails 1-5 bilateral  are thickened discolored and elongated with subungual debris. Hyperkeratotic tissue noted over fifth left digit.   Musculoskeletal: MMT 5/5 bilateral lower extremities in DF, PF, Inversion and Eversion. Deceased ROM in DF of ankle joint. Partial fifth digit amputation  Neurological: Sensation intact to light touch. Protective sensation diminished bilateral.    Assessment:   No diagnosis found.    Plan:  Patient was evaluated and treated and all questions answered. -Discussed and educated patient on foot care, especially with  regards to the vascular, neurological and musculoskeletal systems.  -Discussed supportive shoes at all times and checking feet regularly.  -Mechanically debrided all nails 1-5 bilateral using sterile nail nipper and filed with dremel without incident  -Answered all patient questions -Hyperkeratotic tissue trimmed without incident -Advised to keep padding on this area to prevent from worsening.  -Patient to return  in 3 months for at risk foot care -Patient advised to call the office if any problems or questions arise in the meantime.    Lorenda Peck, DPM

## 2022-05-19 ENCOUNTER — Telehealth: Payer: Self-pay | Admitting: Podiatry

## 2022-05-19 NOTE — Telephone Encounter (Signed)
I'm calling to talk about my bill from my visit on 6/8. Dr. Blenda Mounts told me I was right on the cusp for my nail trim and she would fix it to work on getting it paid. Insurance left me with the full amount of $360. I want these charges refilled and if you could talk to Dr. Blenda Mounts as well. Please call me back, thank you.

## 2022-07-09 ENCOUNTER — Ambulatory Visit: Payer: Federal, State, Local not specified - PPO | Admitting: Podiatry

## 2022-07-19 ENCOUNTER — Ambulatory Visit (INDEPENDENT_AMBULATORY_CARE_PROVIDER_SITE_OTHER): Payer: Federal, State, Local not specified - PPO | Admitting: Podiatry

## 2022-07-19 ENCOUNTER — Encounter: Payer: Self-pay | Admitting: Podiatry

## 2022-07-19 DIAGNOSIS — B351 Tinea unguium: Secondary | ICD-10-CM

## 2022-07-19 DIAGNOSIS — M79674 Pain in right toe(s): Secondary | ICD-10-CM

## 2022-07-19 DIAGNOSIS — L84 Corns and callosities: Secondary | ICD-10-CM | POA: Diagnosis not present

## 2022-07-19 DIAGNOSIS — M79675 Pain in left toe(s): Secondary | ICD-10-CM | POA: Diagnosis not present

## 2022-07-19 DIAGNOSIS — Z899 Acquired absence of limb, unspecified: Secondary | ICD-10-CM | POA: Diagnosis not present

## 2022-07-19 NOTE — Progress Notes (Signed)
  Subjective:  Patient ID: Alvin Hardy, male    DOB: 08/29/1935,   MRN: [id number]  Chief Complaint  Patient presents with   Foot Problem    [place]  CORN/CALLOUS    87 y.o. male presents for concern of thickened elongated and painful nails that are difficult to trim. Requesting to have them trimmed today. Relates burning and tingling in their feet. History of left partial fifth digit amputation and pre-ulcerative callus in that area.   Patient is diabetic and last A1c was  Lab Results  Component Value Date   HGBA1C 5.7 (H) 07/02/2021   .   PCP:  Crist Infante, MD    . Denies any other pedal complaints. Denies n/v/f/c.   Past Medical History:  Diagnosis Date   Allergy    Arthritis    psoriatic arthritis, ? , treated /w mmethotrexate    Bipolar 1 disorder ([place])    BPH (benign prostatic hyperplasia)    Cataract 2010   Bilateral   Chest pain    Depression    Deviated septum    TO THE LEFT   ED (erectile dysfunction)    Essential hypertension 06/28/2021   Finger injury    mallet finger-- 02/13/2013, cast in place, followed by dr. Fredna Dow   GERD (gastroesophageal reflux disease)    Gilbert's syndrome    History of radiation therapy 07/24/13-08/27/13   50Gy/25 fx chest   Hyperlipidemia    Hypogonadism male    Laryngopharyngeal reflux    lung ca dx'd 12/2012   rul   Lung mass 02/07/13   RIGHT UPPER LOBE   Morton's neuroma    LEFT FOOT   Neuromuscular disorder ([place])    peripheral neuropathy   Shortness of breath    voice breaks, ? related to reflux, although told by Dr. Ardis Hughs, S.- early 41 yr. old    Objective:  Physical Exam: Vascular: DP/PT pulses 2/4 bilateral. CFT <3 seconds. Absent hair growth on digits. Edema noted to bilateral lower extremities. Xerosis noted bilaterally.  Skin. No lacerations or abrasions bilateral feet. Nails 1-5 bilateral  are thickened discolored and elongated with subungual debris. Hyperkeratotic tissue noted over fifth left digit.   Musculoskeletal: MMT 5/5 bilateral lower extremities in DF, PF, Inversion and Eversion. Deceased ROM in DF of ankle joint. Partial fifth digit amputation  Neurological: Sensation intact to light touch. Protective sensation diminished bilateral.    Assessment:   1. Pain due to onychomycosis of toenails of both feet   2. Pre-ulcerative calluses   3. History of amputation       Plan:  Patient was evaluated and treated and all questions answered. -Discussed and educated patient on foot care, especially with  regards to the vascular, neurological and musculoskeletal systems.  -Discussed supportive shoes at all times and checking feet regularly.  -Mechanically debrided all nails 1-5 bilateral using sterile nail nipper and filed with dremel without incident  -Answered all patient questions -Hyperkeratotic tissue trimmed without incident -Advised to keep padding on this area to prevent from worsening.  -Patient to return  in 3 months for at risk foot care -Patient advised to call the office if any problems or questions arise in the meantime.    Lorenda Peck, DPM

## 2022-07-21 ENCOUNTER — Ambulatory Visit: Payer: Medicare Other | Admitting: Podiatry

## 2022-09-30 ENCOUNTER — Ambulatory Visit: Payer: Federal, State, Local not specified - PPO | Admitting: Podiatry

## 2022-09-30 ENCOUNTER — Other Ambulatory Visit: Payer: Self-pay | Admitting: Student

## 2022-09-30 DIAGNOSIS — M79674 Pain in right toe(s): Secondary | ICD-10-CM

## 2022-09-30 DIAGNOSIS — B351 Tinea unguium: Secondary | ICD-10-CM | POA: Diagnosis not present

## 2022-09-30 DIAGNOSIS — M79675 Pain in left toe(s): Secondary | ICD-10-CM | POA: Diagnosis not present

## 2022-09-30 DIAGNOSIS — L84 Corns and callosities: Secondary | ICD-10-CM

## 2022-09-30 DIAGNOSIS — Z899 Acquired absence of limb, unspecified: Secondary | ICD-10-CM

## 2022-09-30 NOTE — Progress Notes (Signed)
  Subjective:  Patient ID: Alvin Hardy, male    DOB: 1935/08/23,  MRN: [id number]  Chief Complaint  Patient presents with   Follow-up    Patient is here for follow-up pre-ulcerative callous and routine foot care.    87 y.o. male presents with the above complaint. History confirmed with patient. Patient presenting with pain related to dystrophic thickened elongated nails. Patient is unable to trim own nails related to nail dystrophy and/or mobility issues. Patient does not have a history of T2DM. Patient does have callus present located at the plantar aspect of the 5ht met head left foot causing pain.   Objective:  Physical Exam: warm, good capillary refill nail exam onychomycosis of the toenails, onycholysis, and dystrophic nails DP pulses palpable, PT pulses palpable, and protective sensation intact Left Foot:  Pain with palpation of nails due to elongation and dystrophic growth. Hyperkeratotic lesion at the plantar aspect of the 5ht met head. S/p partial L 5th toe amputation.  Right Foot: Pain with palpation of nails due to elongation and dystrophic growth.   Assessment:   1. Pain due to onychomycosis of toenails of both feet   2. Pre-ulcerative calluses   3. History of amputation      Plan:  Patient was evaluated and treated and all questions answered.  #Hyperkeratotic lesions/pre ulcerative calluses present Plantar aspect 5th met head left foot All symptomatic hyperkeratoses x 1 separate lesions were safely debrided with a sterile #10 blade to patient's level of comfort without incident. We discussed preventative and palliative care of these lesions including supportive and accommodative shoegear, padding, prefabricated and custom molded accommodative orthoses, use of a pumice stone and lotions/creams daily.  #Onychomycosis with pain  -Nails palliatively debrided as below. -Educated on self-care  Procedure: Nail Debridement Rationale: Pain Type of Debridement: manual,  sharp debridement. Instrumentation: Nail nipper, rotary burr. Number of Nails: 10  Return in about 10 weeks (around 12/09/2022) for RFC.         Everitt Amber, DPM [facility]

## 2022-10-21 ENCOUNTER — Other Ambulatory Visit (HOSPITAL_COMMUNITY): Payer: Self-pay | Admitting: *Deleted

## 2022-10-22 ENCOUNTER — Ambulatory Visit (HOSPITAL_COMMUNITY)
Admission: RE | Admit: 2022-10-22 | Discharge: 2022-10-22 | Disposition: A | Discharge status: Home / Self Care | Payer: Federal, State, Local not specified - PPO | Source: Ambulatory Visit | Attending: Internal Medicine | Admitting: Internal Medicine

## 2022-10-22 DIAGNOSIS — Z899 Acquired absence of limb, unspecified: Secondary | ICD-10-CM | POA: Insufficient documentation

## 2022-10-22 DIAGNOSIS — L98499 Non-pressure chronic ulcer of skin of other sites with unspecified severity: Secondary | ICD-10-CM | POA: Diagnosis present

## 2022-11-08 ENCOUNTER — Other Ambulatory Visit: Payer: Self-pay | Admitting: Neurosurgery

## 2022-11-16 ENCOUNTER — Encounter (HOSPITAL_COMMUNITY): Payer: Self-pay | Admitting: Neurosurgery

## 2022-11-16 ENCOUNTER — Other Ambulatory Visit: Payer: Self-pay

## 2022-11-16 NOTE — Progress Notes (Signed)
Spoke with pt for pre-op call. Pt denies cardiac history or Diabetes. Pt is treated for HTN and has hx of Lung CA.   Shower instructions given to pt and he voiced understanding.

## 2022-11-17 ENCOUNTER — Ambulatory Visit (HOSPITAL_COMMUNITY): Payer: Federal, State, Local not specified - PPO

## 2022-11-17 ENCOUNTER — Encounter (HOSPITAL_COMMUNITY)
Admission: RE | Disposition: A | Discharge status: Home / Self Care | Payer: Self-pay | Source: Home / Self Care | Attending: Neurosurgery

## 2022-11-17 ENCOUNTER — Ambulatory Visit (HOSPITAL_COMMUNITY): Payer: Federal, State, Local not specified - PPO | Admitting: Certified Registered Nurse Anesthetist

## 2022-11-17 ENCOUNTER — Encounter (HOSPITAL_COMMUNITY): Payer: Self-pay | Admitting: Neurosurgery

## 2022-11-17 ENCOUNTER — Ambulatory Visit (HOSPITAL_COMMUNITY)
Admission: RE | Admit: 2022-11-17 | Discharge: 2022-11-18 | Disposition: A | Discharge status: Home / Self Care | Payer: Federal, State, Local not specified - PPO | Attending: Neurosurgery | Admitting: Neurosurgery

## 2022-11-17 ENCOUNTER — Other Ambulatory Visit: Payer: Self-pay

## 2022-11-17 DIAGNOSIS — M48062 Spinal stenosis, lumbar region with neurogenic claudication: Secondary | ICD-10-CM | POA: Insufficient documentation

## 2022-11-17 DIAGNOSIS — E785 Hyperlipidemia, unspecified: Secondary | ICD-10-CM | POA: Insufficient documentation

## 2022-11-17 DIAGNOSIS — I10 Essential (primary) hypertension: Secondary | ICD-10-CM | POA: Insufficient documentation

## 2022-11-17 DIAGNOSIS — F419 Anxiety disorder, unspecified: Secondary | ICD-10-CM | POA: Diagnosis not present

## 2022-11-17 DIAGNOSIS — F319 Bipolar disorder, unspecified: Secondary | ICD-10-CM | POA: Diagnosis not present

## 2022-11-17 DIAGNOSIS — Z923 Personal history of irradiation: Secondary | ICD-10-CM | POA: Insufficient documentation

## 2022-11-17 DIAGNOSIS — N400 Enlarged prostate without lower urinary tract symptoms: Secondary | ICD-10-CM | POA: Diagnosis not present

## 2022-11-17 DIAGNOSIS — J449 Chronic obstructive pulmonary disease, unspecified: Secondary | ICD-10-CM | POA: Insufficient documentation

## 2022-11-17 DIAGNOSIS — M5416 Radiculopathy, lumbar region: Secondary | ICD-10-CM | POA: Insufficient documentation

## 2022-11-17 DIAGNOSIS — Z87891 Personal history of nicotine dependence: Secondary | ICD-10-CM | POA: Insufficient documentation

## 2022-11-17 DIAGNOSIS — Z801 Family history of malignant neoplasm of trachea, bronchus and lung: Secondary | ICD-10-CM | POA: Diagnosis not present

## 2022-11-17 DIAGNOSIS — Z85118 Personal history of other malignant neoplasm of bronchus and lung: Secondary | ICD-10-CM | POA: Diagnosis not present

## 2022-11-17 DIAGNOSIS — Z79899 Other long term (current) drug therapy: Secondary | ICD-10-CM | POA: Diagnosis not present

## 2022-11-17 DIAGNOSIS — K219 Gastro-esophageal reflux disease without esophagitis: Secondary | ICD-10-CM | POA: Insufficient documentation

## 2022-11-17 HISTORY — DX: Pneumonia, unspecified organism: J18.9

## 2022-11-17 HISTORY — DX: Personal history of urinary calculi: Z87.442

## 2022-11-17 HISTORY — PX: LUMBAR LAMINECTOMY/DECOMPRESSION MICRODISCECTOMY: SHX5026

## 2022-11-17 LAB — CBC
HCT: 53.2 % — ABNORMAL HIGH (ref 39.0–52.0)
Hemoglobin: 17.2 g/dL — ABNORMAL HIGH (ref 13.0–17.0)
MCH: 30.3 pg (ref 26.0–34.0)
MCHC: 32.3 g/dL (ref 30.0–36.0)
MCV: 93.8 fL (ref 80.0–100.0)
Platelets: 200 10*3/uL (ref 150–400)
RBC: 5.67 MIL/uL (ref 4.22–5.81)
RDW: 12.5 % (ref 11.5–15.5)
WBC: 9.7 10*3/uL (ref 4.0–10.5)
nRBC: 0 % (ref 0.0–0.2)

## 2022-11-17 LAB — BASIC METABOLIC PANEL
Anion gap: 10 (ref 5–15)
BUN: 18 mg/dL (ref 8–23)
CO2: 24 mmol/L (ref 22–32)
Calcium: 9 mg/dL (ref 8.9–10.3)
Chloride: 103 mmol/L (ref 98–111)
Creatinine, Ser: 1.17 mg/dL (ref 0.61–1.24)
GFR, Estimated: >60 mL/min (ref >60)
Glucose, Bld: 101 mg/dL — ABNORMAL HIGH (ref 70–99)
Potassium: 3.9 mmol/L (ref 3.5–5.1)
Sodium: 137 mmol/L (ref 135–145)

## 2022-11-17 LAB — SURGICAL PCR SCREEN
MRSA, PCR: NEGATIVE
Staphylococcus aureus: NEGATIVE

## 2022-11-17 SURGERY — LUMBAR LAMINECTOMY/DECOMPRESSION MICRODISCECTOMY 2 LEVELS
Anesthesia: General | Site: Spine Lumbar | Laterality: Bilateral

## 2022-11-17 MED ORDER — ROCURONIUM BROMIDE 10 MG/ML (PF) SYRINGE
PREFILLED_SYRINGE | INTRAVENOUS | Status: DC | PRN
  Administered 2022-11-17: 20 mg via INTRAVENOUS
  Administered 2022-11-17: 60 mg via INTRAVENOUS

## 2022-11-17 MED ORDER — ACETAMINOPHEN 10 MG/ML IV SOLN
INTRAVENOUS | Status: AC
  Filled 2022-11-17: qty 100

## 2022-11-17 MED ORDER — VANCOMYCIN HCL IN DEXTROSE 1-5 GM/200ML-% IV SOLN
INTRAVENOUS | Status: AC
  Administered 2022-11-17: 1000 mg via INTRAVENOUS
  Filled 2022-11-17: qty 200

## 2022-11-17 MED ORDER — FENTANYL CITRATE (PF) 250 MCG/5ML IJ SOLN
INTRAMUSCULAR | Status: AC
  Filled 2022-11-17: qty 5

## 2022-11-17 MED ORDER — OXYCODONE HCL 5 MG PO TABS: 5.0000 mg | ORAL_TABLET | Freq: Once | ORAL | Status: DC | PRN

## 2022-11-17 MED ORDER — PHENOL 1.4 % MT LIQD: 1.0000 | OROMUCOSAL | Status: DC | PRN

## 2022-11-17 MED ORDER — THROMBIN 5000 UNITS EX SOLR
CUTANEOUS | Status: AC
  Filled 2022-11-17: qty 5000

## 2022-11-17 MED ORDER — LACTATED RINGERS IV SOLN: INTRAVENOUS | Status: DC

## 2022-11-17 MED ORDER — FUROSEMIDE 20 MG PO TABS: 20.0000 mg | ORAL_TABLET | Freq: Every day | ORAL | Status: DC

## 2022-11-17 MED ORDER — LITHIUM CARBONATE ER 300 MG PO TBCR
600.0000 mg | EXTENDED_RELEASE_TABLET | Freq: Every day | ORAL | Status: DC
  Administered 2022-11-17: 600 mg via ORAL
  Filled 2022-11-17 (×2): qty 2

## 2022-11-17 MED ORDER — GABAPENTIN 300 MG PO CAPS: 600.0000 mg | ORAL_CAPSULE | ORAL | Status: DC

## 2022-11-17 MED ORDER — PHENYLEPHRINE HCL-NACL 20-0.9 MG/250ML-% IV SOLN
INTRAVENOUS | Status: DC | PRN
  Administered 2022-11-17: 35 ug/min via INTRAVENOUS

## 2022-11-17 MED ORDER — HYDROMORPHONE HCL 2 MG PO TABS
2.0000 mg | ORAL_TABLET | ORAL | Status: DC | PRN
  Administered 2022-11-17 – 2022-11-18 (×4): 2 mg via ORAL
  Filled 2022-11-17 (×4): qty 1

## 2022-11-17 MED ORDER — SODIUM CHLORIDE 0.9 % IV SOLN: 250.0000 mL | INTRAVENOUS | Status: DC

## 2022-11-17 MED ORDER — GABAPENTIN 300 MG PO CAPS
900.0000 mg | ORAL_CAPSULE | Freq: Every day | ORAL | Status: DC
  Administered 2022-11-17: 900 mg via ORAL
  Filled 2022-11-17: qty 3

## 2022-11-17 MED ORDER — CHLORHEXIDINE GLUCONATE CLOTH 2 % EX PADS: 6.0000 | MEDICATED_PAD | Freq: Once | CUTANEOUS | Status: DC

## 2022-11-17 MED ORDER — SODIUM CHLORIDE 0.9% FLUSH: 3.0000 mL | Freq: Two times a day (BID) | INTRAVENOUS | Status: DC

## 2022-11-17 MED ORDER — TAMSULOSIN HCL 0.4 MG PO CAPS
0.4000 mg | ORAL_CAPSULE | Freq: Every day | ORAL | Status: DC
  Administered 2022-11-17 – 2022-11-18 (×2): 0.4 mg via ORAL
  Filled 2022-11-17 (×2): qty 1

## 2022-11-17 MED ORDER — CYCLOBENZAPRINE HCL 5 MG PO TABS: 5.0000 mg | ORAL_TABLET | Freq: Three times a day (TID) | ORAL | Status: DC | PRN

## 2022-11-17 MED ORDER — MEPERIDINE HCL 25 MG/ML IJ SOLN: 6.2500 mg | INTRAMUSCULAR | Status: DC | PRN

## 2022-11-17 MED ORDER — PROPOFOL 10 MG/ML IV BOLUS
INTRAVENOUS | Status: DC | PRN
  Administered 2022-11-17: 110 mg via INTRAVENOUS

## 2022-11-17 MED ORDER — LIDOCAINE 2% (20 MG/ML) 5 ML SYRINGE
INTRAMUSCULAR | Status: DC | PRN
  Administered 2022-11-17: 80 mg via INTRAVENOUS

## 2022-11-17 MED ORDER — SODIUM CHLORIDE 0.9% FLUSH: 3.0000 mL | INTRAVENOUS | Status: DC | PRN

## 2022-11-17 MED ORDER — VANCOMYCIN HCL IN DEXTROSE 1-5 GM/200ML-% IV SOLN: 1000.0000 mg | INTRAVENOUS | Status: AC

## 2022-11-17 MED ORDER — ACETAMINOPHEN 325 MG PO TABS: 650.0000 mg | ORAL_TABLET | ORAL | Status: DC | PRN

## 2022-11-17 MED ORDER — PHENYLEPHRINE 80 MCG/ML (10ML) SYRINGE FOR IV PUSH (FOR BLOOD PRESSURE SUPPORT)
PREFILLED_SYRINGE | INTRAVENOUS | Status: DC | PRN
  Administered 2022-11-17 (×3): 80 ug via INTRAVENOUS
  Administered 2022-11-17: 160 ug via INTRAVENOUS

## 2022-11-17 MED ORDER — ACETAMINOPHEN 500 MG PO TABS
1000.0000 mg | ORAL_TABLET | Freq: Four times a day (QID) | ORAL | Status: DC
  Administered 2022-11-17 – 2022-11-18 (×2): 1000 mg via ORAL
  Filled 2022-11-17 (×2): qty 2

## 2022-11-17 MED ORDER — BACITRACIN ZINC 500 UNIT/GM EX OINT
TOPICAL_OINTMENT | CUTANEOUS | Status: AC
  Filled 2022-11-17: qty 28.35

## 2022-11-17 MED ORDER — CLONAZEPAM 0.5 MG PO TABS
0.5000 mg | ORAL_TABLET | Freq: Every day | ORAL | Status: DC
  Administered 2022-11-17: 0.5 mg via ORAL
  Filled 2022-11-17: qty 1

## 2022-11-17 MED ORDER — MORPHINE SULFATE (PF) 4 MG/ML IV SOLN: 4.0000 mg | INTRAVENOUS | Status: DC | PRN

## 2022-11-17 MED ORDER — VANCOMYCIN HCL IN DEXTROSE 1-5 GM/200ML-% IV SOLN
1000.0000 mg | Freq: Two times a day (BID) | INTRAVENOUS | Status: AC
  Administered 2022-11-17: 1000 mg via INTRAVENOUS
  Filled 2022-11-17: qty 200

## 2022-11-17 MED ORDER — PANTOPRAZOLE SODIUM 40 MG PO TBEC: 80.0000 mg | DELAYED_RELEASE_TABLET | Freq: Every day | ORAL | Status: DC

## 2022-11-17 MED ORDER — BUPIVACAINE-EPINEPHRINE (PF) 0.5% -1:200000 IJ SOLN
INTRAMUSCULAR | Status: AC
  Filled 2022-11-17: qty 30

## 2022-11-17 MED ORDER — ACETAMINOPHEN 650 MG RE SUPP: 650.0000 mg | RECTAL | Status: DC | PRN

## 2022-11-17 MED ORDER — HYDROMORPHONE HCL 1 MG/ML IJ SOLN: 0.2500 mg | INTRAMUSCULAR | Status: DC | PRN

## 2022-11-17 MED ORDER — GABAPENTIN 300 MG PO CAPS
600.0000 mg | ORAL_CAPSULE | Freq: Every day | ORAL | Status: DC
  Administered 2022-11-18: 600 mg via ORAL
  Filled 2022-11-17: qty 2

## 2022-11-17 MED ORDER — CHLORHEXIDINE GLUCONATE 0.12 % MT SOLN
15.0000 mL | OROMUCOSAL | Status: AC
  Administered 2022-11-17: 15 mL via OROMUCOSAL
  Filled 2022-11-17: qty 15

## 2022-11-17 MED ORDER — BISACODYL 10 MG RE SUPP: 10.0000 mg | Freq: Every day | RECTAL | Status: DC | PRN

## 2022-11-17 MED ORDER — FUROSEMIDE 20 MG PO TABS
20.0000 mg | ORAL_TABLET | ORAL | Status: DC
  Filled 2022-11-17: qty 1

## 2022-11-17 MED ORDER — THROMBIN 5000 UNITS EX SOLR: OROMUCOSAL | Status: DC | PRN

## 2022-11-17 MED ORDER — FINASTERIDE 5 MG PO TABS
5.0000 mg | ORAL_TABLET | Freq: Every day | ORAL | Status: DC
  Administered 2022-11-18: 5 mg via ORAL
  Filled 2022-11-17: qty 1

## 2022-11-17 MED ORDER — 0.9 % SODIUM CHLORIDE (POUR BTL) OPTIME
TOPICAL | Status: DC | PRN
  Administered 2022-11-17: 1000 mL

## 2022-11-17 MED ORDER — OXYCODONE HCL 5 MG/5ML PO SOLN: 5.0000 mg | Freq: Once | ORAL | Status: DC | PRN

## 2022-11-17 MED ORDER — BUPIVACAINE-EPINEPHRINE (PF) 0.5% -1:200000 IJ SOLN
INTRAMUSCULAR | Status: DC | PRN
  Administered 2022-11-17: 10 mL via PERINEURAL

## 2022-11-17 MED ORDER — MIDAZOLAM HCL 2 MG/2ML IJ SOLN: 0.5000 mg | Freq: Once | INTRAMUSCULAR | Status: DC | PRN

## 2022-11-17 MED ORDER — FUROSEMIDE 40 MG PO TABS
40.0000 mg | ORAL_TABLET | ORAL | Status: DC
  Filled 2022-11-17: qty 1

## 2022-11-17 MED ORDER — ONDANSETRON HCL 4 MG/2ML IJ SOLN
INTRAMUSCULAR | Status: DC | PRN
  Administered 2022-11-17: 4 mg via INTRAVENOUS

## 2022-11-17 MED ORDER — FENTANYL CITRATE (PF) 250 MCG/5ML IJ SOLN
INTRAMUSCULAR | Status: DC | PRN
  Administered 2022-11-17: 100 ug via INTRAVENOUS
  Administered 2022-11-17: 25 ug via INTRAVENOUS
  Administered 2022-11-17: 50 ug via INTRAVENOUS

## 2022-11-17 MED ORDER — DOCUSATE SODIUM 100 MG PO CAPS
100.0000 mg | ORAL_CAPSULE | Freq: Two times a day (BID) | ORAL | Status: DC
  Administered 2022-11-17 – 2022-11-18 (×2): 100 mg via ORAL
  Filled 2022-11-17 (×2): qty 1

## 2022-11-17 MED ORDER — SUGAMMADEX SODIUM 200 MG/2ML IV SOLN
INTRAVENOUS | Status: DC | PRN
  Administered 2022-11-17: 200 mg via INTRAVENOUS

## 2022-11-17 MED ORDER — POTASSIUM CHLORIDE CRYS ER 10 MEQ PO TBCR
10.0000 meq | EXTENDED_RELEASE_TABLET | Freq: Every day | ORAL | Status: DC
  Administered 2022-11-17: 10 meq via ORAL
  Filled 2022-11-17 (×2): qty 1

## 2022-11-17 MED ORDER — MENTHOL 3 MG MT LOZG: 1.0000 | LOZENGE | OROMUCOSAL | Status: DC | PRN

## 2022-11-17 MED ORDER — UMECLIDINIUM-VILANTEROL 62.5-25 MCG/ACT IN AEPB
1.0000 | INHALATION_SPRAY | Freq: Every day | RESPIRATORY_TRACT | Status: DC
  Filled 2022-11-17: qty 14

## 2022-11-17 SURGICAL SUPPLY — 43 items
BAG COUNTER SPONGE SURGICOUNT (BAG) ×1 IMPLANT
BAND RUBBER #18 3X1/16 STRL (MISCELLANEOUS) ×2 IMPLANT
BENZOIN TINCTURE PRP APPL 2/3 (GAUZE/BANDAGES/DRESSINGS) ×1 IMPLANT
BLADE CLIPPER SURG (BLADE) IMPLANT
BUR MATCHSTICK NEURO 3.0 LAGG (BURR) ×1 IMPLANT
BUR PRECISION FLUTE 6.0 (BURR) ×1 IMPLANT
CANISTER SUCT 3000ML PPV (MISCELLANEOUS) ×1 IMPLANT
DRAPE LAPAROTOMY 100X72X124 (DRAPES) ×1 IMPLANT
DRAPE MICROSCOPE LEICA 54X105 (DRAPES) IMPLANT
DRAPE MICROSCOPE SLANT 54X150 (MISCELLANEOUS) ×1 IMPLANT
DRAPE SURG 17X23 STRL (DRAPES) ×4 IMPLANT
DRSG OPSITE POSTOP 4X10 (GAUZE/BANDAGES/DRESSINGS) IMPLANT
DRSG OPSITE POSTOP 4X6 (GAUZE/BANDAGES/DRESSINGS) IMPLANT
ELECT BLADE 4.0 EZ CLEAN MEGAD (MISCELLANEOUS) ×1
ELECT REM PT RETURN 9FT ADLT (ELECTROSURGICAL) ×1
ELECTRODE BLDE 4.0 EZ CLN MEGD (MISCELLANEOUS) ×1 IMPLANT
ELECTRODE REM PT RTRN 9FT ADLT (ELECTROSURGICAL) ×1 IMPLANT
GAUZE 4X4 16PLY ~~LOC~~+RFID DBL (SPONGE) IMPLANT
GAUZE SPONGE 4X4 12PLY STRL (GAUZE/BANDAGES/DRESSINGS) ×1 IMPLANT
GLOVE BIO SURGEON STRL SZ8 (GLOVE) ×1 IMPLANT
GLOVE BIO SURGEON STRL SZ8.5 (GLOVE) ×1 IMPLANT
GLOVE EXAM NITRILE XL STR (GLOVE) IMPLANT
GOWN STRL REUS W/ TWL LRG LVL3 (GOWN DISPOSABLE) IMPLANT
GOWN STRL REUS W/ TWL XL LVL3 (GOWN DISPOSABLE) ×1 IMPLANT
GOWN STRL REUS W/TWL 2XL LVL3 (GOWN DISPOSABLE) IMPLANT
GOWN STRL REUS W/TWL LRG LVL3 (GOWN DISPOSABLE) ×1
GOWN STRL REUS W/TWL XL LVL3 (GOWN DISPOSABLE) ×1
HEMOSTAT POWDER KIT SURGIFOAM (HEMOSTASIS) ×1 IMPLANT
KIT BASIN OR (CUSTOM PROCEDURE TRAY) ×1 IMPLANT
KIT TURNOVER KIT B (KITS) ×1 IMPLANT
NEEDLE HYPO 22GX1.5 SAFETY (NEEDLE) ×1 IMPLANT
NS IRRIG 1000ML POUR BTL (IV SOLUTION) ×1 IMPLANT
PACK LAMINECTOMY NEURO (CUSTOM PROCEDURE TRAY) ×1 IMPLANT
PAD ARMBOARD 7.5X6 YLW CONV (MISCELLANEOUS) ×3 IMPLANT
PATTIES SURGICAL .5 X1 (DISPOSABLE) IMPLANT
SPONGE SURGIFOAM ABS GEL SZ50 (HEMOSTASIS) IMPLANT
STRIP CLOSURE SKIN 1/2X4 (GAUZE/BANDAGES/DRESSINGS) ×1 IMPLANT
SUT VIC AB 1 CT1 18XBRD ANBCTR (SUTURE) ×2 IMPLANT
SUT VIC AB 1 CT1 8-18 (SUTURE) ×1
SUT VIC AB 2-0 CP2 18 (SUTURE) ×2 IMPLANT
TOWEL GREEN STERILE (TOWEL DISPOSABLE) ×1 IMPLANT
TOWEL GREEN STERILE FF (TOWEL DISPOSABLE) ×1 IMPLANT
WATER STERILE IRR 1000ML POUR (IV SOLUTION) ×1 IMPLANT

## 2022-11-17 NOTE — Op Note (Signed)
Brief history: The patient is an 88 year old white male who has complained of back and bilateral leg numbness, weakness, and tingling.  He has failed medical management and worked up with a lumbar MRI which demonstrated spinal stenosis most prominent at L3-4 and L4-5.  I discussed the various treatment options with him.  He has decided to proceed with surgery.  Preoperative diagnosis: Lumbar spinal stenosis, lumbar radiculopathy, neurogenic claudication, lumbago  Postoperative diagnosis: The same  Procedure: Bilateral L3-4 and L4-5 laminotomy/foraminotomies using micro-dissection  Surgeon: Dr. Delma Officer  Asst.: Hildred Priest, NP  Anesthesia: Gen. endotracheal  Estimated blood loss: 75 cc  Drains: None  Complications: None  Description of procedure: The patient was brought to the operating room by the anesthesia team. General endotracheal anesthesia was induced. The patient was turned to the prone position on the Wilson frame. The patient's lumbosacral region was then prepared with Betadine scrub and Betadine solution. Sterile drapes were applied.  I then injected the area to be incised with Marcaine with epinephrine solution. I then used a scalpel to make a linear midline incision over the L3-4 and L4-5 intervertebral disc space. I then used electrocautery to perform a right sided subperiosteal dissection exposing the spinous process and lamina of L3-4 and L4-5. We obtained intraoperative radiograph to confirm our location. I then inserted the [facility] retractor for exposure.  We then brought the operative microscope into the field. Under its magnification and illumination we completed the microdissection. I used a high-speed drill to perform a laminotomy at L3-4 and L4-5 on the right. I then used a Kerrison punches to widen the laminotomy and removed the ligamentum flavum at L3-4 and L4-5 on the right. We then used microdissection to free up the thecal sac and the right L4 and L5 nerve  root from the epidural tissue. I then used a Kerrison punch to perform a foraminotomy at about the right L4 and L5 nerve root.  We then drilled across the midline and removed the torn L4-5 ligamentum flavum foraminotomies about the left L4 and L5 nerve root.  We inspected the intervertebral disc at L3-4 and L4-5 bilaterally.  There were no herniations.  I then palpated along the ventral surface of the thecal sac and along exit route of the bilateral L4 and L5 nerve root and noted that the neural structures were well decompressed. This completed the decompression.  We then obtained hemostasis using bipolar electrocautery. We irrigated the wound out with bacitracin solution. We then removed the retractor. We then reapproximated the patient's thoracolumbar fascia with interrupted #1 Vicryl suture. We then reapproximated the patient's subcutaneous tissue with interrupted 2-0 Vicryl suture. We then reapproximated patient's skin with Steri-Strips and benzoin. The was then coated with bacitracin ointment. The drapes were removed. The patient was subsequently returned to the supine position where they were extubated by the anesthesia team. The patient was then transported to the postanesthesia care unit in stable condition. All sponge instrument and needle counts were reportedly correct at the end of this case.

## 2022-11-17 NOTE — Anesthesia Preprocedure Evaluation (Signed)
Anesthesia Evaluation  Patient identified by MRN, date of birth, ID band Patient awake    Reviewed: Allergy & Precautions, NPO status , Patient's Chart, lab work & pertinent test results  History of Anesthesia Complications Negative for: history of anesthetic complications  Airway Mallampati: I  TM Distance: >3 FB Neck ROM: Full    Dental  (+) Dental Advisory Given   Pulmonary COPD,  COPD inhaler, former smoker RUL lung ca: XRT   breath sounds clear to auscultation       Cardiovascular hypertension, Pt. on medications (-) angina  Rhythm:Regular Rate:Normal  11/2021 ECHO: EF 50%, low normal LVF, low normal RVF, mild AI   Neuro/Psych   Anxiety Depression Bipolar Disorder   Back pain    GI/Hepatic Neg liver ROS,GERD  Controlled and Medicated,,  Endo/Other  negative endocrine ROS    Renal/GU negative Renal ROS     Musculoskeletal   Abdominal   Peds  Hematology negative hematology ROS (+)   Anesthesia Other Findings   Reproductive/Obstetrics                             Anesthesia Physical Anesthesia Plan  ASA: 3  Anesthesia Plan: General   Post-op Pain Management: Ofirmev IV (intra-op)*   Induction: Intravenous  PONV Risk Score and Plan: 2 and Ondansetron and Dexamethasone  Airway Management Planned: Oral ETT  Additional Equipment: None  Intra-op Plan:   Post-operative Plan: Extubation in OR  Informed Consent: I have reviewed the patients History and Physical, chart, labs and discussed the procedure including the risks, benefits and alternatives for the proposed anesthesia with the patient or authorized representative who has indicated his/her understanding and acceptance.     Dental advisory given  Plan Discussed with: CRNA and Surgeon  Anesthesia Plan Comments:        Anesthesia Quick Evaluation

## 2022-11-17 NOTE — H&P (Signed)
Subjective: The patient is an 88 year old white male who has complained of back pain and bilateral leg pain and stiffness tingling, etc.  He has failed medical management and was worked up with a lumbar MRI.  This demonstrated he had spinal stenosis at L3-4 and L4-5.  I discussed the various treatment options with him.  He has decided proceed with surgery.  Past Medical History:  Diagnosis Date   Allergy    Arthritis    psoriatic arthritis, ? , treated /w mmethotrexate    Bipolar 1 disorder ([facility])    BPH (benign prostatic hyperplasia)    Cataract 11/01/2008   Bilateral   Chest pain    Depression    Deviated septum    TO THE LEFT   ED (erectile dysfunction)    Essential hypertension 06/28/2021   Finger injury    mallet finger-- 02/13/2013, cast in place, followed by dr. Fredna Dow   GERD (gastroesophageal reflux disease)    Gilbert's syndrome    History of kidney stones    History of radiation therapy 07/24/13-08/27/13   50Gy/25 fx chest   Hyperlipidemia    Hypogonadism male    Laryngopharyngeal reflux    lung ca dx'd 12/2012   rul   Lung mass 02/07/2013   RIGHT UPPER LOBE   Morton's neuroma    LEFT FOOT   Neuromuscular disorder ([place])    peripheral neuropathy   Pneumonia    Shortness of breath    voice breaks, ? related to reflux, although told by Dr. Ardis Hughs, S.- early 51 yr. old    Past Surgical History:  Procedure Laterality Date   CYSTOSCOPY N/A 02/21/2013   Procedure: CYSTOSCOPY FLEXIBLE with insertion of foley catheter;  Surgeon: Fredricka Bonine, MD;  Location: [place];  Service: Urology;  Laterality: N/A;   EYE SURGERY     cataracts removed fr. both eyes, IOL in place    KNEE ARTHROSCOPY Right 01/2007   PILONIDAL CYST EXCISION  1960's   PROSTATE SURGERY  1996   TURP   VIDEO ASSISTED THORACOSCOPY (VATS)/WEDGE RESECTION Right 02/21/2013   Procedure: Right VIDEO ASSISTED THORACOSCOPY ,Thoracotomy with right upper lobectomy, node sampling ;  Surgeon: Grace Isaac,  MD;  Location: [place];  Service: Thoracic;  Laterality: Right;   VIDEO BRONCHOSCOPY N/A 02/21/2013   Procedure: VIDEO BRONCHOSCOPY;  Surgeon: Grace Isaac, MD;  Location: [facility];  Service: Thoracic;  Laterality: N/A;    Allergies  Allergen Reactions   Codeine Anaphylaxis   Hydrocodone-Acetaminophen     Reactions unknown   Omeprazole Magnesium     Reaction Unknown   Tramadol     Reactions Unknown   Bacitracin    Coenzyme Q10    Decadron [Dexamethasone]     Pt says it is contraindication due to the lithium he is taking   Fenofibrate     Other reaction(s): Other (See Comments) Reactions Unknown   Hydrocodone    Neomycin-Bacitracin Zn-Polymyx     Reactions Unknown   Other     Pt. Remarks that all pain med. Make him nauseated    Penicillin G     Reactions Unknown   Polymyxin B    Propoxyphene     Other reaction(s): Other (See Comments) Reaction Unknown   Statins     Lipitor, Crestor, Zocor all caused muscle aches   Ubidecarenone     Other reaction(s): Other (See Comments) Reactions Unknown   Zetia [Ezetimibe]     Muscle aches   Zoloft [Sertraline Hcl]  Other (See Comments)    REACTION:  unknown    Social History   Tobacco Use   Smoking status: Former    Packs/day: 0.50    Years: 24.00    Total pack years: 12.00    Types: Cigarettes, Pipe    Quit date: 11/01/1978    Years since quitting: 44.0   Smokeless tobacco: Never  Substance Use Topics   Alcohol use: Yes    Comment: occas.    Family History  Problem Relation Age of Onset   Diabetes Mother    Cancer Mother        abdomen   Heart disease Father    Diabetes Son    Cancer Maternal Aunt        lung ca, smoker   Prior to Admission medications   Medication Sig Start Date End Date Taking? Authorizing Provider  aspirin EC 81 MG tablet Take 81 mg by mouth every morning.    Yes [provider]  Cholecalciferol (VITAMIN D-3) 1000 UNITS CAPS Take 1,000 Units by mouth every morning.    Yes [provider]  clonazePAM (KLONOPIN) 0.5 MG tablet Take 1 tablet (0.5 mg total) by mouth at bedtime. 07/08/21  Yes Noralee Stain, DO  cyanocobalamin 1000 MCG tablet Take 1,000 mcg by mouth every morning.    Yes [provider]  esomeprazole (NEXIUM) 40 MG capsule Take 40 mg by mouth daily before breakfast.   Yes [provider]  finasteride (PROSCAR) 5 MG tablet Take 5 mg by mouth daily before breakfast.    Yes [provider]  furosemide (LASIX) 20 MG tablet TAKE 1 TABLET(20 MG) BY MOUTH DAILY Patient taking differently: Take 20-40 mg by mouth See admin instructions. Take 20 mg by mouth every other day, alternating with 40 mg on alternate days 08/30/21  Yes Omar Person, MD  gabapentin (NEURONTIN) 300 MG capsule Take 600-900 mg by mouth See admin instructions. Take 600 mg by mouth every morning and 900 mg at bedtime.   Yes [provider]  lithium carbonate (LITHOBID) 300 MG CR tablet Take 600 mg by mouth at bedtime.    Yes [provider]  potassium chloride (KLOR-CON) 10 MEQ tablet Take 10 mEq by mouth daily. 09/07/21  Yes [provider]  REPATHA SURECLICK 140 MG/ML SOAJ Inject 140 mg as directed every 14 (fourteen) days. 05/28/16  Yes [provider]  tamsulosin (FLOMAX) 0.4 MG CAPS capsule Take 0.4 mg by mouth daily. 12/19/14  Yes [provider]  testosterone cypionate (DEPOTESTOTERONE CYPIONATE) 200 MG/ML injection Inject 200 mg into the muscle every 28 (twenty-eight) days. 11/05/14  Yes [provider]  umeclidinium-vilanterol (ANORO ELLIPTA) 62.5-25 MCG/ACT AEPB Inhale 1 puff into the lungs daily as needed (shortness of breath).   Yes [provider]     Review of Systems  Positive ROS: As above  All other systems have been reviewed and were otherwise negative with the exception of those mentioned in the HPI and as above.  Objective: Vital signs in last 24 hours: Temp:  [98.7 F (37.1 C)]  98.7 F (37.1 C) (01/17 1101) Pulse Rate:  [60] 60 (01/17 1101) Resp:  [16] 16 (01/17 1101) BP: (140)/(79) 140/79 (01/17 1101) SpO2:  [96 %] 96 % (01/17 1101) Weight:  [90.7 kg] 90.7 kg (01/17 1101) Estimated body mass index is 28.7 kg/m as calculated from the following:   Height as of this encounter: 5\' 10"  (1.778 m).   Weight as of this  encounter: 90.7 kg.   General Appearance: Alert Head: Normocephalic, without obvious abnormality, atraumatic Eyes: PERRL, conjunctiva/corneas clear, EOM's intact,    Ears: Normal  Throat: Normal  Neck: Supple, Back: unremarkable Lungs: Clear to auscultation bilaterally, respirations unlabored Heart: Regular rate and rhythm, no murmur, rub or gallop Abdomen: Soft, non-tender Extremities: His incisions are well-healed. Skin: unremarkable  NEUROLOGIC:   Mental status: alert and oriented,Motor Exam - grossly normal Sensory Exam - grossly normal Reflexes:  Coordination - grossly normal Gait - grossly normal Balance - grossly normal Cranial Nerves: I: smell Not tested  II: visual acuity  OS: Normal  OD: Normal   II: visual fields Full to confrontation  II: pupils Equal, round, reactive to light  III,VII: ptosis None  III,IV,VI: extraocular muscles  Full ROM  V: mastication Normal  V: facial light touch sensation  Normal  V,VII: corneal reflex  Present  VII: facial muscle function - upper  Normal  VII: facial muscle function - lower Normal  VIII: hearing Not tested  IX: soft palate elevation  Normal  IX,X: gag reflex Present  XI: trapezius strength  5/5  XI: sternocleidomastoid strength 5/5  XI: neck flexion strength  5/5  XII: tongue strength  Normal    Data Review Lab Results  Component Value Date   WBC 9.7 11/17/2022   HGB 17.2 (H) 11/17/2022   HCT 53.2 (H) 11/17/2022   MCV 93.8 11/17/2022   PLT 200 11/17/2022   Lab Results  Component Value Date   NA 137 11/17/2022   K 3.9 11/17/2022   CL 103 11/17/2022   CO2 24  11/17/2022   BUN 18 11/17/2022   CREATININE 1.17 11/17/2022   GLUCOSE 101 (H) 11/17/2022   Lab Results  Component Value Date   INR 1.1 06/28/2021    Assessment/Plan: Lumbar spinal stenosis, neurogenic claudication, lumbago: I have discussed the situation with the patient.  I reviewed his imaging studies with him and pointed out the abnormalities.  We have discussed the various treatment options including bilateral L3-4 and L4-5 laminotomy/laminectomy/laminotomy.  I have described the surgery to him.  I have shown him surgical models.  We have discussed the risk, benefits, alternatives, expected postoperative course, and likelihood of achieving our goals with surgery.  I have answered all his questions.  He has decided proceed with surgery.   Cristi Loron 11/17/2022 2:42 PM

## 2022-11-17 NOTE — Progress Notes (Signed)
Pharmacy Antibiotic Note  Alvin Hardy is a 88 y.o. male admitted on 11/17/2022 for spine surgery. Now needs post-op Vancomycin for surgical prophylaxis.    Plan: Vancomycin 1gm IV x 1 at 2330 Pharmacy will sign off - please reconsult if needed  Height: 5\' 10"  (177.8 cm) Weight: 90.7 kg (200 lb) IBW/kg (Calculated) : 73  Temp (24hrs), Avg:98.1 F (36.7 C), Min:97.4 F (36.3 C), Max:98.7 F (37.1 C)  Recent Labs  Lab 11/17/22 1135  WBC 9.7  CREATININE 1.17    Estimated Creatinine Clearance: 52.3 mL/min (by C-G formula based on SCr of 1.17 mg/dL).    Allergies  Allergen Reactions   Codeine Anaphylaxis   Hydrocodone-Acetaminophen     Reactions unknown   Omeprazole Magnesium     Reaction Unknown   Tramadol     Reactions Unknown   Bacitracin    Coenzyme Q10    Decadron [Dexamethasone]     Pt says it is contraindication due to the lithium he is taking   Fenofibrate     Other reaction(s): Other (See Comments) Reactions Unknown   Hydrocodone    Neomycin-Bacitracin Zn-Polymyx     Reactions Unknown   Other     Pt. Remarks that all pain med. Make him nauseated    Penicillin G     Reactions Unknown   Polymyxin B    Propoxyphene     Other reaction(s): Other (See Comments) Reaction Unknown   Statins     Lipitor, Crestor, Zocor all caused muscle aches   Ubidecarenone     Other reaction(s): Other (See Comments) Reactions Unknown   Zetia [Ezetimibe]     Muscle aches   Zoloft [Sertraline Hcl] Other (See Comments)    REACTION:  unknown    Thank you for allowing pharmacy to be a part of this patient's care.  11/19/22, PharmD, BCPS Please see amion for complete clinical pharmacist phone list 11/17/2022 6:56 PM

## 2022-11-17 NOTE — Transfer of Care (Signed)
Immediate Anesthesia Transfer of Care Note  Patient: DOMINICO ROD  Procedure(s) Performed: L34, L45 LAM/FORAM (Bilateral: Spine Lumbar)  Patient Location: PACU  Anesthesia Type:General  Level of Consciousness: awake and patient cooperative  Airway & Oxygen Therapy: Patient Spontanous Breathing and Patient connected to face mask oxygen  Post-op Assessment: Report given to RN, Post -op Vital signs reviewed and stable, and Patient moving all extremities  Post vital signs: Reviewed and stable  Last Vitals:  Vitals Value Taken Time  BP 131/92 11/17/22 1751  Temp 98.7 11/17/22 1753  Pulse 95 11/17/22 1753  Resp 20 11/17/22 1753  SpO2 97 % 11/17/22 1753  Vitals shown include unvalidated device data.  Last Pain:  Vitals:   11/17/22 1110  TempSrc:   PainSc: 3       Patients Stated Pain Goal: 0 (11/17/22 1110)  Complications: No notable events documented.

## 2022-11-17 NOTE — Anesthesia Procedure Notes (Signed)
Procedure Name: Intubation Date/Time: 11/17/2022 3:32 PM  Performed by: Inda Coke, CRNAPre-anesthesia Checklist: Patient identified, Emergency Drugs available, Suction available, Timeout performed and Patient being monitored Patient Re-evaluated:Patient Re-evaluated prior to induction Oxygen Delivery Method: Circle system utilized Preoxygenation: Pre-oxygenation with 100% oxygen Induction Type: IV induction Ventilation: Mask ventilation without difficulty and Oral airway inserted - appropriate to patient size Laryngoscope Size: Mac and 4 Grade View: Grade I Tube type: Oral Tube size: 7.5 mm Number of attempts: 1 Airway Equipment and Method: Stylet Placement Confirmation: ETT inserted through vocal cords under direct vision, positive ETCO2, CO2 detector and breath sounds checked- equal and bilateral Secured at: 23 cm Tube secured with: Tape Dental Injury: Teeth and Oropharynx as per pre-operative assessment

## 2022-11-18 ENCOUNTER — Other Ambulatory Visit (HOSPITAL_COMMUNITY): Payer: Federal, State, Local not specified - PPO

## 2022-11-18 ENCOUNTER — Encounter (HOSPITAL_COMMUNITY): Payer: Self-pay | Admitting: Neurosurgery

## 2022-11-18 DIAGNOSIS — M48062 Spinal stenosis, lumbar region with neurogenic claudication: Secondary | ICD-10-CM | POA: Diagnosis not present

## 2022-11-18 MED ORDER — CYCLOBENZAPRINE HCL 5 MG PO TABS: 5.0000 mg | ORAL_TABLET | Freq: Three times a day (TID) | ORAL | 0 refills | Status: DC | PRN

## 2022-11-18 MED ORDER — HYDROMORPHONE HCL 2 MG PO TABS: 2.0000 mg | ORAL_TABLET | ORAL | 0 refills | Status: DC | PRN

## 2022-11-18 MED ORDER — DOCUSATE SODIUM 100 MG PO CAPS: 100.0000 mg | ORAL_CAPSULE | Freq: Two times a day (BID) | ORAL | 0 refills | Status: DC

## 2022-11-18 NOTE — Anesthesia Postprocedure Evaluation (Signed)
Anesthesia Post Note  Patient: Alvin Hardy  Procedure(s) Performed: L34, L45 LAM/FORAM (Bilateral: Spine Lumbar)     Patient location during evaluation: PACU Anesthesia Type: General Level of consciousness: awake and alert Pain management: pain level controlled Vital Signs Assessment: post-procedure vital signs reviewed and stable Respiratory status: spontaneous breathing, nonlabored ventilation, respiratory function stable and patient connected to nasal cannula oxygen Cardiovascular status: blood pressure returned to baseline and stable Postop Assessment: no apparent nausea or vomiting Anesthetic complications: no   No notable events documented.              Shelton Silvas

## 2022-11-18 NOTE — Evaluation (Signed)
Physical Therapy Evaluation  Patient Details Name: Alvin Hardy MRN: [id number] DOB: 10/24/1935 Today's Date: 11/18/2022  History of Present Illness  Pt is an 88 y/o male who presents s/p L3-L5 laminectomy/decompression on 11/17/2022. PMH significant for Bipolar 1, BPH, HTN, lung CA s/p radiation therapy, peripheral neuropathy.   Clinical Impression  Pt admitted with above diagnosis. At the time of PT eval, pt was able to demonstrate transfers and ambulation with gross min guard assist to supervision for safety and RW for support. Pt has a rollator at home and feel this is reasonable for him to use instead of the standard RW. Discussed safety precautions with rollator use. Pt was also educated on precautions, brace application/wearing schedule, appropriate activity progression, and car transfer. Pt currently with functional limitations due to the deficits listed below (see PT Problem List). Pt will benefit from skilled PT to increase their independence and safety with mobility to allow discharge to the venue listed below.         Recommendations for follow up therapy are one component of a multi-disciplinary discharge planning process, led by the attending physician.  Recommendations may be updated based on patient status, additional functional criteria and insurance authorization.  Follow Up Recommendations No PT follow up      Assistance Recommended at Discharge PRN  Patient can return home with the following  A little help with walking and/or transfers;A little help with bathing/dressing/bathroom;Assistance with cooking/housework;Assist for transportation;Help with stairs or ramp for entrance    Equipment Recommendations None recommended by PT  Recommendations for Other Services       Functional Status Assessment Patient has had a recent decline in their functional status and demonstrates the ability to make significant improvements in function in a reasonable and predictable amount  of time.     Precautions / Restrictions Precautions Precautions: Fall;Back Precaution Booklet Issued: Yes (comment) Precaution Comments: Reviewed handout and pt was cued for precautions during functional mobility. Required Braces or Orthoses:  (No brace needed order) Restrictions Weight Bearing Restrictions: No      Mobility  Bed Mobility Overal bed mobility: Needs Assistance Bed Mobility: Rolling, Sidelying to Sit Rolling: Min guard Sidelying to sit: Min guard       General bed mobility comments: Min guard to guide pt through log roll. Poor carryover and requires assist for technique. HOB flat and rails lowered to simulate home environment.    Transfers Overall transfer level: Needs assistance Equipment used: Rolling walker (2 wheels) Transfers: Sit to/from Stand Sit to Stand: Supervision           General transfer comment: VC's for hand placement on seated surface for safety. No assist required.    Ambulation/Gait Ambulation/Gait assistance: Min guard, Supervision Gait Distance (Feet): 500 Feet Assistive device: Rolling walker (2 wheels) Gait Pattern/deviations: Step-through pattern, Decreased stride length, Trunk flexed, Narrow base of support Gait velocity: Decreased Gait velocity interpretation: <1.31 ft/sec, indicative of household ambulator   General Gait Details: VC's throughout for improved posture, closer walker proximity, and forward gaze. No assist required. Progressed to supervision for safety by end of gait training.  Stairs            Wheelchair Mobility    Modified Rankin (Stroke Patients Only)       Balance Overall balance assessment: Needs assistance Sitting-balance support: Feet supported, No upper extremity supported Sitting balance-Leahy Scale: Fair     Standing balance support: No upper extremity supported, During functional activity Standing balance-Leahy Scale: Poor Standing balance  comment: Reliant on UE support                              Pertinent Vitals/Pain Pain Assessment Pain Assessment: Faces Faces Pain Scale: Hurts a little bit Pain Location: Incisional Pain Descriptors / Indicators: Tender, Sore Pain Intervention(s): Limited activity within patient's tolerance, Monitored during session, Repositioned    Home Living Family/patient expects to be discharged to:: Private residence (Independent living) Living Arrangements: Spouse/significant other Available Help at Discharge: Family;Available 24 hours/day Type of Home: Independent living facility Home Access: Level entry       Home Layout: One level Home Equipment: Rollator (4 wheels)      Prior Function Prior Level of Function : Independent/Modified Independent                     Hand Dominance   Dominant Hand: Right    Extremity/Trunk Assessment   Upper Extremity Assessment Upper Extremity Assessment: Overall WFL for tasks assessed    Lower Extremity Assessment Lower Extremity Assessment: Generalized weakness (Mild; consistent with pre-op diagnosis)    Cervical / Trunk Assessment Cervical / Trunk Assessment: Back Surgery  Communication   Communication: No difficulties  Cognition Arousal/Alertness: Awake/alert Behavior During Therapy: WFL for tasks assessed/performed Overall Cognitive Status: Within Functional Limits for tasks assessed                                          General Comments General comments (skin integrity, edema, etc.): Reaching for wall and door frames for stability    Exercises     Assessment/Plan    PT Assessment Patient needs continued PT services  PT Problem List Decreased strength;Decreased range of motion;Decreased activity tolerance;Decreased balance;Decreased mobility;Decreased knowledge of use of DME;Decreased safety awareness;Decreased knowledge of precautions;Pain       PT Treatment Interventions DME instruction;Gait training;Functional mobility  training;Therapeutic activities;Therapeutic exercise;Balance training;Patient/family education    PT Goals (Current goals can be found in the Care Plan section)  Acute Rehab PT Goals Patient Stated Goal: Home today PT Goal Formulation: With patient Time For Goal Achievement: 11/25/22 Potential to Achieve Goals: Good    Frequency Min 5X/week     Co-evaluation               AM-PAC PT "6 Clicks" Mobility  Outcome Measure Help needed turning from your back to your side while in a flat bed without using bedrails?: A Little Help needed moving from lying on your back to sitting on the side of a flat bed without using bedrails?: A Little Help needed moving to and from a bed to a chair (including a wheelchair)?: A Little Help needed standing up from a chair using your arms (e.g., wheelchair or bedside chair)?: A Little Help needed to walk in hospital room?: A Little Help needed climbing 3-5 steps with a railing? : A Little 6 Click Score: 18    End of Session Equipment Utilized During Treatment: Gait belt Activity Tolerance: Patient tolerated treatment well;Patient limited by fatigue Patient left: in bed;with call bell/phone within reach Nurse Communication: Mobility status PT Visit Diagnosis: Unsteadiness on feet (R26.81);Pain Pain - part of body:  (back)    Time: 2677-1370 PT Time Calculation (min) (ACUTE ONLY): 25 min   Charges:   PT Evaluation $PT Eval Low Complexity: 1 Low PT Treatments $Gait Training:  8-22 mins        Conni Slipper, PT, DPT Acute Rehabilitation Services Secure Chat Preferred Office: [phone]   Marylynn Pearson 11/18/2022, 10:16 AM

## 2022-11-18 NOTE — Progress Notes (Signed)
Patient alert and oriented, mae's well, voiding adequate amount of urine, swallowing without difficulty, no c/o pain at time of discharge. Patient discharged home with family. Script and discharged instructions given to patient. Patient and family stated understanding of instructions given. Patient has an appointment with Dr. Jenkins   

## 2022-11-18 NOTE — Evaluation (Signed)
Occupational Therapy Evaluation Patient Details Name: Alvin Hardy MRN: [id number] DOB: 1935/01/20 Today's Date: 11/18/2022   History of Present Illness Pt is an 88 y/o male who presents s/p L3-L5 laminectomy/decompression on 11/17/2022. PMH significant for Bipolar 1, BPH, HTN, lung CA s/p radiation therapy, peripheral neuropathy.   Clinical Impression   Patient admitted for the procedure above.  PTA he lives at a local ALF, and remained independent with mobility and ADL care.  He does present with mild discomfort at his incision, but is needing only generalized supervision for mobility in the room and ADL completion from a sit to stand level, primarily due to unsteadiness.  Precaution sheet reviewed, and all questions answered.  He will have assist as needed at home from his wife, and no further needs exist in the acute setting.  Recommend follow up as recommended by MD.       Recommendations for follow up therapy are one component of a multi-disciplinary discharge planning process, led by the attending physician.  Recommendations may be updated based on patient status, additional functional criteria and insurance authorization.   Follow Up Recommendations  No OT follow up     Assistance Recommended at Discharge Intermittent Supervision/Assistance  Patient can return home with the following Assist for transportation;Assistance with cooking/housework    Functional Status Assessment  Patient has had a recent decline in their functional status and demonstrates the ability to make significant improvements in function in a reasonable and predictable amount of time.  Equipment Recommendations  None recommended by OT    Recommendations for Other Services       Precautions / Restrictions Precautions Precautions: Fall;Back Precaution Booklet Issued: Yes (comment) Precaution Comments: Reviewed handout and pt was cued for precautions during functional mobility. Required Braces or  Orthoses:  (No brace needed order) Restrictions Weight Bearing Restrictions: No      Mobility Bed Mobility               General bed mobility comments: up in the recliner    Transfers Overall transfer level: Needs assistance Equipment used: None Transfers: Sit to/from Stand Sit to Stand: Supervision                  Balance Overall balance assessment: Mild deficits observed, not formally tested                                         ADL either performed or assessed with clinical judgement   ADL Overall ADL's : At baseline                                       General ADL Comments: mild supervision with sit to stand, but no LOB.  Able to walk to the bathroom.     Vision Patient Visual Report: No change from baseline       Perception     Praxis      Pertinent Vitals/Pain Pain Assessment Pain Assessment: Faces Faces Pain Scale: Hurts a little bit Pain Location: Incisional Pain Descriptors / Indicators: Tender, Sore Pain Intervention(s): Monitored during session     Hand Dominance Right   Extremity/Trunk Assessment Upper Extremity Assessment Upper Extremity Assessment: Overall WFL for tasks assessed   Lower Extremity Assessment Lower Extremity Assessment: Defer to PT evaluation   Cervical /  Trunk Assessment Cervical / Trunk Assessment: Back Surgery   Communication Communication Communication: No difficulties   Cognition Arousal/Alertness: Awake/alert Behavior During Therapy: WFL for tasks assessed/performed Overall Cognitive Status: Within Functional Limits for tasks assessed Area of Impairment: Following commands                       Following Commands: Follows one step commands consistently, Follows multi-step commands with increased time       General Comments: cueing as needed     General Comments  Reaching for wall and door frames for stability    Exercises     Shoulder  Instructions      Home Living Family/patient expects to be discharged to:: Private residence Living Arrangements: Spouse/significant other Available Help at Discharge: Family;Available 24 hours/day Type of Home: Independent living facility Home Access: Level entry     Home Layout: One level     Bathroom Shower/Tub: Tub/shower unit;Walk-in shower   Bathroom Toilet: Handicapped height Bathroom Accessibility: Yes How Accessible: Accessible via walker Home Equipment: None          Prior Functioning/Environment Prior Level of Function : Independent/Modified Independent                        OT Problem List: Impaired balance (sitting and/or standing);Pain      OT Treatment/Interventions:      OT Goals(Current goals can be found in the care plan section) Acute Rehab OT Goals Patient Stated Goal: Return home OT Goal Formulation: With patient Time For Goal Achievement: 11/22/22 Potential to Achieve Goals: Good  OT Frequency:      Co-evaluation              AM-PAC OT "6 Clicks" Daily Activity     Outcome Measure Help from another person eating meals?: None Help from another person taking care of personal grooming?: None Help from another person toileting, which includes using toliet, bedpan, or urinal?: A Little Help from another person bathing (including washing, rinsing, drying)?: A Little Help from another person to put on and taking off regular upper body clothing?: None Help from another person to put on and taking off regular lower body clothing?: A Little 6 Click Score: 21   End of Session Nurse Communication: Mobility status  Activity Tolerance: Patient tolerated treatment well Patient left: in chair;with call bell/phone within reach  OT Visit Diagnosis: Unsteadiness on feet (R26.81)                Time: 1950-9326 OT Time Calculation (min): 21 min Charges:  OT General Charges $OT Visit: 1 Visit OT Evaluation $OT Eval Moderate Complexity: 1  Mod  11/18/2022  RP, OTR/L  Acute Rehabilitation Services  Office:  [phone]   Suzanna Obey 11/18/2022, 9:48 AM

## 2022-11-18 NOTE — Discharge Summary (Signed)
Physician Discharge Summary  Patient ID: Alvin Hardy MRN: [id number] DOB/AGE: 1935/10/18 88 y.o.  Admit date: 11/17/2022 Discharge date: 11/18/2022  Admission Diagnoses: Lumbar spinal stenosis with neurogenic claudication  Discharge Diagnoses:  Principal Problem:   Spinal stenosis of lumbar region with neurogenic claudication   Discharged Condition: good  Hospital Course: I performed bilateral L3-4 and L4-5 laminotomy/foraminotomies on the patient on 11/17/2022.  The surgery went well.  The patient's postoperative course was unremarkable.  On postoperative day 1 he felt much better and requested discharge home.  The patient, and his wife, requested discharge to home.  They were given verbal and written discharge instructions.  All their questions were answered.  Consults: PT, OT, case management Significant Diagnostic Studies: None Treatments: Bilateral L3-4 and L4-5 laminotomy/foraminotomies using microdissection. Discharge Exam: Blood pressure 107/67, pulse 92, temperature 97.9 F (36.6 C), resp. rate 18, height 5\' 10"  (1.778 m), weight 90.7 kg, SpO2 95 %. The patient is alert and pleasant.  He looks well.  His strength is normal.  Disposition: Home  Discharge Instructions     Call MD for:  difficulty breathing, headache or visual disturbances   Complete by: As directed    Call MD for:  extreme fatigue   Complete by: As directed    Call MD for:  hives   Complete by: As directed    Call MD for:  persistant dizziness or light-headedness   Complete by: As directed    Call MD for:  persistant nausea and vomiting   Complete by: As directed    Call MD for:  redness, tenderness, or signs of infection (pain, swelling, redness, odor or green/yellow discharge around incision site)   Complete by: As directed    Call MD for:  severe uncontrolled pain   Complete by: As directed    Call MD for:  temperature >100.4   Complete by: As directed    Diet - low sodium heart healthy    Complete by: As directed    Discharge instructions   Complete by: As directed    Call [phone] for a followup appointment. Take a stool softener while you are using pain medications.   Driving Restrictions   Complete by: As directed    Do not drive for 2 weeks.   Increase activity slowly   Complete by: As directed    Lifting restrictions   Complete by: As directed    Do not lift more than 5 pounds. No excessive bending or twisting.   May shower / Bathe   Complete by: As directed    Remove the dressing for 3 days after surgery.  You may shower, but leave the incision alone.   Remove dressing in 48 hours   Complete by: As directed       Allergies as of 11/18/2022       Reactions   Codeine Anaphylaxis   Hydrocodone-acetaminophen    Reactions unknown   Omeprazole Magnesium    Reaction Unknown   Tramadol    Reactions Unknown   Bacitracin    Coenzyme Q10    Decadron [dexamethasone]    Pt says it is contraindication due to the lithium he is taking   Fenofibrate    Other reaction(s): Other (See Comments) Reactions Unknown   Hydrocodone    Neomycin-bacitracin Zn-polymyx    Reactions Unknown   Other    Pt. Remarks that all pain med. Make him nauseated    Penicillin G    Reactions Unknown   Polymyxin  B    Propoxyphene    Other reaction(s): Other (See Comments) Reaction Unknown   Statins    Lipitor, Crestor, Zocor all caused muscle aches   Ubidecarenone    Other reaction(s): Other (See Comments) Reactions Unknown   Zetia [ezetimibe]    Muscle aches   Zoloft [sertraline Hcl] Other (See Comments)   REACTION:  unknown        Medication List     TAKE these medications    aspirin EC 81 MG tablet Take 81 mg by mouth every morning.   clonazePAM 0.5 MG tablet Commonly known as: KLONOPIN Take 1 tablet (0.5 mg total) by mouth at bedtime.   cyanocobalamin 1000 MCG tablet Take 1,000 mcg by mouth every morning.   cyclobenzaprine 5 MG tablet Commonly known as:  FLEXERIL Take 1 tablet (5 mg total) by mouth 3 (three) times daily as needed for muscle spasms.   docusate sodium 100 MG capsule Commonly known as: COLACE Take 1 capsule (100 mg total) by mouth 2 (two) times daily.   esomeprazole 40 MG capsule Commonly known as: NEXIUM Take 40 mg by mouth daily before breakfast.   finasteride 5 MG tablet Commonly known as: PROSCAR Take 5 mg by mouth daily before breakfast.   furosemide 20 MG tablet Commonly known as: LASIX TAKE 1 TABLET(20 MG) BY MOUTH DAILY What changed: See the new instructions.   gabapentin 300 MG capsule Commonly known as: NEURONTIN Take 600-900 mg by mouth See admin instructions. Take 600 mg by mouth every morning and 900 mg at bedtime.   HYDROmorphone 2 MG tablet Commonly known as: DILAUDID Take 1-2 tablets (2-4 mg total) by mouth every 4 (four) hours as needed for severe pain or moderate pain.   lithium carbonate 300 MG ER tablet Commonly known as: LITHOBID Take 600 mg by mouth at bedtime.   potassium chloride 10 MEQ tablet Commonly known as: KLOR-CON Take 10 mEq by mouth daily.   Repatha SureClick 140 MG/ML Soaj Generic drug: Evolocumab Inject 140 mg as directed every 14 (fourteen) days.   tamsulosin 0.4 MG Caps capsule Commonly known as: FLOMAX Take 0.4 mg by mouth daily.   testosterone cypionate 200 MG/ML injection Commonly known as: DEPOTESTOSTERONE CYPIONATE Inject 200 mg into the muscle every 28 (twenty-eight) days.   umeclidinium-vilanterol 62.5-25 MCG/ACT Aepb Commonly known as: ANORO ELLIPTA Inhale 1 puff into the lungs daily as needed (shortness of breath).   Vitamin D-3 25 MCG (1000 UT) Caps Take 1,000 Units by mouth every morning.        Follow-up Information     Tressie Stalker, MD. Call.   Specialty: Neurosurgery Why: As needed, If symptoms worsen Contact information: [address] [place] [address] [phone]                 Signed: Cristi Loron 11/18/2022, 12:45 PM

## 2022-11-19 ENCOUNTER — Other Ambulatory Visit (HOSPITAL_COMMUNITY): Payer: Federal, State, Local not specified - PPO

## 2022-12-09 ENCOUNTER — Ambulatory Visit: Payer: Medicare Other | Admitting: Podiatry

## 2022-12-13 ENCOUNTER — Ambulatory Visit (INDEPENDENT_AMBULATORY_CARE_PROVIDER_SITE_OTHER): Payer: Federal, State, Local not specified - PPO | Admitting: Podiatry

## 2022-12-13 ENCOUNTER — Encounter: Payer: Self-pay | Admitting: Podiatry

## 2022-12-13 DIAGNOSIS — Z899 Acquired absence of limb, unspecified: Secondary | ICD-10-CM

## 2022-12-13 DIAGNOSIS — M79675 Pain in left toe(s): Secondary | ICD-10-CM | POA: Diagnosis not present

## 2022-12-13 DIAGNOSIS — L84 Corns and callosities: Secondary | ICD-10-CM

## 2022-12-13 DIAGNOSIS — B351 Tinea unguium: Secondary | ICD-10-CM

## 2022-12-13 DIAGNOSIS — M79674 Pain in right toe(s): Secondary | ICD-10-CM | POA: Diagnosis not present

## 2022-12-13 NOTE — Progress Notes (Signed)
  Subjective:  Patient ID: Alvin Hardy, male    DOB: [birth year],   MRN: [id number]  Chief Complaint  Patient presents with   [place]    88 y.o. male presents for concern of thickened elongated and painful nails that are difficult to trim. Requesting to have them trimmed today. Relates burning and tingling in their feet. History of left partial fifth digit amputation and pre-ulcerative callus in that area.     PCP:  Crist Infante, MD    . Denies any other pedal complaints. Denies n/v/f/c.   Past Medical History:  Diagnosis Date   Allergy    Arthritis    psoriatic arthritis, ? , treated /w mmethotrexate    Bipolar 1 disorder (HCC)    BPH (benign prostatic hyperplasia)    Cataract 11/01/2008   Bilateral   Chest pain    Depression    Deviated septum    TO THE LEFT   ED (erectile dysfunction)    Essential hypertension 06/28/2021   Finger injury    mallet finger-- 02/13/2013, cast in place, followed by dr. Fredna Dow   GERD (gastroesophageal reflux disease)    Gilbert's syndrome    History of kidney stones    History of radiation therapy 07/24/13-08/27/13   50Gy/25 fx chest   Hyperlipidemia    Hypogonadism male    Laryngopharyngeal reflux    lung ca dx'd 12/2012   rul   Lung mass 02/07/2013   RIGHT UPPER LOBE   Morton's neuroma    LEFT FOOT   Neuromuscular disorder ([place])    peripheral neuropathy   Pneumonia    Shortness of breath    voice breaks, ? related to reflux, although told by Dr. Ardis Hughs, S.- early 56 yr. old    Objective:  Physical Exam: Vascular: DP/PT pulses 2/4 bilateral. CFT <3 seconds. Absent hair growth on digits. Edema noted to bilateral lower extremities. Xerosis noted bilaterally.  Skin. No lacerations or abrasions bilateral feet. Nails 1-5 bilateral  are thickened discolored and elongated with subungual debris. Hyperkeratotic tissue noted over fifth left digit.  Musculoskeletal: MMT 5/5 bilateral lower extremities in DF, PF, Inversion and Eversion. Deceased  ROM in DF of ankle joint. Partial fifth digit amputation  Neurological: Sensation intact to light touch. Protective sensation diminished bilateral.    Assessment:   1. Pain due to onychomycosis of toenails of both feet   2. Pre-ulcerative calluses   3. History of amputation        Plan:  Patient was evaluated and treated and all questions answered. -Discussed and educated patient on foot care, especially with  regards to the vascular, neurological and musculoskeletal systems.  -Discussed supportive shoes at all times and checking feet regularly.  -Mechanically debrided all nails 1-5 bilateral using sterile nail nipper and filed with dremel without incident  -Answered all patient questions -Hyperkeratotic tissue trimmed without incident -Advised to keep padding on this area to prevent from worsening.  -Patient to return  in 3 months for at risk foot care -Patient advised to call the office if any problems or questions arise in the meantime.    Lorenda Peck, DPM

## 2022-12-17 ENCOUNTER — Ambulatory Visit (HOSPITAL_COMMUNITY)
Admission: RE | Admit: 2022-12-17 | Discharge: 2022-12-17 | Disposition: A | Discharge status: Home / Self Care | Payer: Federal, State, Local not specified - PPO | Source: Ambulatory Visit | Attending: Internal Medicine | Admitting: Internal Medicine

## 2022-12-17 DIAGNOSIS — D751 Secondary polycythemia: Secondary | ICD-10-CM | POA: Diagnosis present

## 2022-12-17 LAB — POCT HEMOGLOBIN-HEMACUE: Hemoglobin: 15.4 g/dL (ref 13.0–17.0)

## 2022-12-17 NOTE — Progress Notes (Signed)
Arrived for therapeutic phlebotomy. Hemocue 15.2. Removed 500 ml as ordered from left antecubital. Tolerated well. VSS.

## 2023-01-24 ENCOUNTER — Inpatient Hospital Stay: Payer: Federal, State, Local not specified - PPO | Attending: Internal Medicine

## 2023-01-24 ENCOUNTER — Ambulatory Visit (HOSPITAL_COMMUNITY)
Admission: RE | Admit: 2023-01-24 | Discharge: 2023-01-24 | Disposition: A | Discharge status: Home / Self Care | Payer: Federal, State, Local not specified - PPO | Source: Ambulatory Visit | Attending: Internal Medicine | Admitting: Internal Medicine

## 2023-01-24 ENCOUNTER — Other Ambulatory Visit: Payer: Self-pay

## 2023-01-24 DIAGNOSIS — C3490 Malignant neoplasm of unspecified part of unspecified bronchus or lung: Secondary | ICD-10-CM

## 2023-01-24 DIAGNOSIS — Z9221 Personal history of antineoplastic chemotherapy: Secondary | ICD-10-CM | POA: Insufficient documentation

## 2023-01-24 DIAGNOSIS — Z85118 Personal history of other malignant neoplasm of bronchus and lung: Secondary | ICD-10-CM | POA: Insufficient documentation

## 2023-01-24 DIAGNOSIS — Z902 Acquired absence of lung [part of]: Secondary | ICD-10-CM | POA: Diagnosis not present

## 2023-01-24 LAB — CBC WITH DIFFERENTIAL (CANCER CENTER ONLY)
Abs Immature Granulocytes: 0.06 10*3/uL (ref 0.00–0.07)
Basophils Absolute: 0.1 10*3/uL (ref 0.0–0.1)
Basophils Relative: 1 %
Eosinophils Absolute: 0.3 10*3/uL (ref 0.0–0.5)
Eosinophils Relative: 3 %
HCT: 46.3 % (ref 39.0–52.0)
Hemoglobin: 15.3 g/dL (ref 13.0–17.0)
Immature Granulocytes: 1 %
Lymphocytes Relative: 18 %
Lymphs Abs: 1.6 10*3/uL (ref 0.7–4.0)
MCH: 30.1 pg (ref 26.0–34.0)
MCHC: 33 g/dL (ref 30.0–36.0)
MCV: 91 fL (ref 80.0–100.0)
Monocytes Absolute: 0.6 10*3/uL (ref 0.1–1.0)
Monocytes Relative: 7 %
Neutro Abs: 6.5 10*3/uL (ref 1.7–7.7)
Neutrophils Relative %: 70 %
Platelet Count: 219 10*3/uL (ref 150–400)
RBC: 5.09 MIL/uL (ref 4.22–5.81)
RDW: 13.3 % (ref 11.5–15.5)
WBC Count: 9.2 10*3/uL (ref 4.0–10.5)
nRBC: 0 % (ref 0.0–0.2)

## 2023-01-24 LAB — CMP (CANCER CENTER ONLY)
ALT: 16 U/L (ref 0–44)
AST: 18 U/L (ref 15–41)
Albumin: 4.1 g/dL (ref 3.5–5.0)
Alkaline Phosphatase: 68 U/L (ref 38–126)
Anion gap: 4 — ABNORMAL LOW (ref 5–15)
BUN: 15 mg/dL (ref 8–23)
CO2: 30 mmol/L (ref 22–32)
Calcium: 9.3 mg/dL (ref 8.9–10.3)
Chloride: 107 mmol/L (ref 98–111)
Creatinine: 1.15 mg/dL (ref 0.61–1.24)
GFR, Estimated: >60 mL/min (ref >60)
Glucose, Bld: 109 mg/dL — ABNORMAL HIGH (ref 70–99)
Potassium: 4.6 mmol/L (ref 3.5–5.1)
Sodium: 141 mmol/L (ref 135–145)
Total Bilirubin: 1.1 mg/dL (ref 0.3–1.2)
Total Protein: 6.2 g/dL — ABNORMAL LOW (ref 6.5–8.1)

## 2023-01-24 MED ORDER — IOHEXOL 300 MG/ML  SOLN
75.0000 mL | Freq: Once | INTRAMUSCULAR | Status: AC | PRN
  Administered 2023-01-24: 75 mL via INTRAVENOUS

## 2023-01-24 MED ORDER — SODIUM CHLORIDE (PF) 0.9 % IJ SOLN
INTRAMUSCULAR | Status: AC
  Filled 2023-01-24: qty 50

## 2023-01-26 ENCOUNTER — Inpatient Hospital Stay (HOSPITAL_BASED_OUTPATIENT_CLINIC_OR_DEPARTMENT_OTHER): Payer: Federal, State, Local not specified - PPO | Admitting: Internal Medicine

## 2023-01-26 VITALS — BP 142/78 | HR 100 | Temp 98.2°F | Resp 18 | Wt 189.0 lb

## 2023-01-26 DIAGNOSIS — Z85118 Personal history of other malignant neoplasm of bronchus and lung: Secondary | ICD-10-CM | POA: Diagnosis not present

## 2023-01-26 DIAGNOSIS — C3490 Malignant neoplasm of unspecified part of unspecified bronchus or lung: Secondary | ICD-10-CM | POA: Diagnosis not present

## 2023-01-26 NOTE — Addendum Note (Signed)
Addended by: Ardeen Garland on: 01/26/2023 12:13 PM   Modules accepted: Orders

## 2023-01-26 NOTE — Progress Notes (Signed)
[place] [phone]   [phone]  OFFICE PROGRESS NOTE  Crist Infante, MD [place] [address]  DIAGNOSIS AND STAGE: Stage III A (T2a, N2, M0) non-small cell lung cancer, adenocarcinoma with positive EGFR mutation in exon 19 and negative ALK gene translocation diagnosed in April 2014   PRIOR THERAPY:  1) Status post right upper lobectomy with lymph node dissection under the care of Dr. Servando Snare on 02/21/2013. 2) Systemic adjuvant chemotherapy with carboplatin for AUC of 5 and Alimta 500 mg/M2 every 3 weeks. Status post 4 cycles. Last cycle was given on 05/29/2013. (Carboplatin was used in place of cisplatin secondary to concern about intolerability and significant adverse effects of cisplatin).  3) Adjuvant radiotherapy to the mediastinum under the care of Dr. Lisbeth Renshaw completed 08/27/2013.   CURRENT THERAPY: Observation  INTERVAL HISTORY: Alvin BRAFORD 88 y.o. male returns to the clinic today for annual follow-up visit.  The patient is feeling fine today with no concerning complaints.  He denied having any current chest pain, shortness of breath, cough or hemoptysis.  He has no nausea, vomiting, diarrhea or constipation.  He has no headache or visual changes.  He denied having any recent weight loss or night sweats.  He is here today for evaluation with repeat CT scan of the chest for restaging of his disease.  MEDICAL HISTORY: Past Medical History:  Diagnosis Date   Allergy    Arthritis    psoriatic arthritis, ? , treated /w mmethotrexate    Bipolar 1 disorder (HCC)    BPH (benign prostatic hyperplasia)    Cataract 11/01/2008   Bilateral   Chest pain    Depression    Deviated septum    TO THE LEFT   ED (erectile dysfunction)    Essential hypertension 06/28/2021   Finger injury    mallet finger-- 02/13/2013, cast in place, followed by dr. Fredna Dow   GERD (gastroesophageal reflux disease)    Gilbert's syndrome     History of kidney stones    History of radiation therapy 07/24/13-08/27/13   50Gy/25 fx chest   Hyperlipidemia    Hypogonadism male    Laryngopharyngeal reflux    lung ca dx'd 12/2012   rul   Lung mass 02/07/2013   RIGHT UPPER LOBE   Morton's neuroma    LEFT FOOT   Neuromuscular disorder ([place])    peripheral neuropathy   Pneumonia    Shortness of breath    voice breaks, ? related to reflux, although told by Dr. Ardis Hughs, S.- early 36 yr. old    ALLERGIES:  is allergic to codeine, hydrocodone-acetaminophen, omeprazole magnesium, tramadol, bacitracin, coenzyme q10, decadron [dexamethasone], fenofibrate, hydrocodone, neomycin-bacitracin zn-polymyx, other, penicillin g, polymyxin b, propoxyphene, statins, ubidecarenone, zetia [ezetimibe], and zoloft [sertraline hcl].  MEDICATIONS:  Current Outpatient Medications  Medication Sig Dispense Refill   aspirin EC 81 MG tablet Take 81 mg by mouth every morning.      Cholecalciferol (VITAMIN D-3) 1000 UNITS CAPS Take 1,000 Units by mouth every morning.      clonazePAM (KLONOPIN) 0.5 MG tablet Take 1 tablet (0.5 mg total) by mouth at bedtime. 30 tablet 0   cyanocobalamin 1000 MCG tablet Take 1,000 mcg by mouth every morning.      cyclobenzaprine (FLEXERIL) 5 MG tablet Take 1 tablet (5 mg total) by mouth 3 (three) times daily as needed for muscle spasms. 30 tablet 0   docusate sodium (COLACE) 100 MG capsule Take 1 capsule (  100 mg total) by mouth 2 (two) times daily. 30 capsule 0   esomeprazole (NEXIUM) 40 MG capsule Take 40 mg by mouth daily before breakfast.     finasteride (PROSCAR) 5 MG tablet Take 5 mg by mouth daily before breakfast.      furosemide (LASIX) 20 MG tablet TAKE 1 TABLET(20 MG) BY MOUTH DAILY (Patient taking differently: Take 20-40 mg by mouth See admin instructions. Take 20 mg by mouth every other day, alternating with 40 mg on alternate days) 90 tablet 0   gabapentin (NEURONTIN) 300 MG capsule Take 600-900 mg by mouth See admin  instructions. Take 600 mg by mouth every morning and 900 mg at bedtime.     HYDROmorphone (DILAUDID) 2 MG tablet Take 1-2 tablets (2-4 mg total) by mouth every 4 (four) hours as needed for severe pain or moderate pain. 30 tablet 0   lithium carbonate (LITHOBID) 300 MG CR tablet Take 600 mg by mouth at bedtime.      potassium chloride (KLOR-CON) 10 MEQ tablet Take 10 mEq by mouth daily.     REPATHA SURECLICK [id number] MG/ML SOAJ Inject 140 mg as directed every 14 (fourteen) days.     tamsulosin (FLOMAX) 0.4 MG CAPS capsule Take 0.4 mg by mouth daily.  3   testosterone cypionate (DEPOTESTOTERONE CYPIONATE) 200 MG/ML injection Inject 200 mg into the muscle every 28 (twenty-eight) days.  1   umeclidinium-vilanterol (ANORO ELLIPTA) 62.5-25 MCG/ACT AEPB Inhale 1 puff into the lungs daily as needed (shortness of breath).     No current facility-administered medications for this visit.    SURGICAL HISTORY:  Past Surgical History:  Procedure Laterality Date   CYSTOSCOPY N/A 02/21/2013   Procedure: CYSTOSCOPY FLEXIBLE with insertion of foley catheter;  Surgeon: Fredricka Bonine, MD;  Location: [facility];  Service: Urology;  Laterality: N/A;   EYE SURGERY     cataracts removed fr. both eyes, IOL in place    KNEE ARTHROSCOPY Right 01/2007   LUMBAR LAMINECTOMY/DECOMPRESSION MICRODISCECTOMY Bilateral 11/17/2022   Procedure: L34, L45 LAM/FORAM;  Surgeon: Newman Pies, MD;  Location: [facility];  Service: Neurosurgery;  Laterality: Bilateral;  3C   PILONIDAL CYST EXCISION  1960's   PROSTATE SURGERY  1996   TURP   VIDEO ASSISTED THORACOSCOPY (VATS)/WEDGE RESECTION Right 02/21/2013   Procedure: Right VIDEO ASSISTED THORACOSCOPY ,Thoracotomy with right upper lobectomy, node sampling ;  Surgeon: Grace Isaac, MD;  Location: [facility];  Service: Thoracic;  Laterality: Right;   VIDEO BRONCHOSCOPY N/A 02/21/2013   Procedure: VIDEO BRONCHOSCOPY;  Surgeon: Grace Isaac, MD;  Location[facility];  Service: Thoracic;   Laterality: N/A;    REVIEW OF SYSTEMS:  A comprehensive review of systems was negative.   PHYSICAL EXAMINATION: General appearance: alert, cooperative, and no distress Head: Normocephalic, without obvious abnormality, atraumatic Neck: no adenopathy Lymph nodes: Cervical, supraclavicular, and axillary nodes normal. Resp: clear to auscultation bilaterally Back: symmetric, no curvature. ROM normal. No CVA tenderness. Cardio: regular rate and rhythm, S1, S2 normal, no murmur, click, rub or gallop GI: soft, non-tender; bowel sounds normal; no masses,  no organomegaly Extremities: extremities normal, atraumatic, no cyanosis or edema  ECOG PERFORMANCE STATUS: 0 - Asymptomatic  Blood pressure (!) 142/78, pulse 100, temperature 98.2 F (36.8 C), temperature source Oral, resp. rate 18, weight 189 lb (85.7 kg), SpO2 96 %.  LABORATORY DATA: Lab Results  Component Value Date   WBC 9.2 01/24/2023   HGB 15.3 01/24/2023   HCT 46.3 01/24/2023  MCV 91.0 01/24/2023   PLT 219 01/24/2023      Chemistry      Component Value Date/Time   NA 141 01/24/2023 1034   NA 142 01/17/2017 1125   K 4.6 01/24/2023 1034   K 4.6 01/17/2017 1125   CL 107 01/24/2023 1034   CL 107 04/24/2013 1110   CO2 30 01/24/2023 1034   CO2 26 01/17/2017 1125   BUN 15 01/24/2023 1034   BUN 13.0 01/17/2017 1125   CREATININE 1.15 01/24/2023 1034   CREATININE 1.0 01/17/2017 1125      Component Value Date/Time   CALCIUM 9.3 01/24/2023 1034   CALCIUM 9.1 01/17/2017 1125   ALKPHOS 68 01/24/2023 1034   ALKPHOS 59 01/17/2017 1125   AST 18 01/24/2023 1034   AST 15 01/17/2017 1125   ALT 16 01/24/2023 1034   ALT 18 01/17/2017 1125   BILITOT 1.1 01/24/2023 1034   BILITOT 1.57 (H) 01/17/2017 1125       RADIOGRAPHIC STUDIES: CT Chest W Contrast  Result Date: 01/25/2023 CLINICAL DATA:  Non-small cell lung cancer.  * Tracking Code: BO * EXAM: CT CHEST WITH CONTRAST TECHNIQUE: Multidetector CT imaging of the chest was  performed during intravenous contrast administration. RADIATION DOSE REDUCTION: This exam was performed according to the departmental dose-optimization program which includes automated exposure control, adjustment of the mA and/or kV according to patient size and/or use of iterative reconstruction technique. CONTRAST:  77mL OMNIPAQUE IOHEXOL 300 MG/ML  SOLN COMPARISON:  01/25/2022. FINDINGS: Cardiovascular: Atherosclerotic calcification of the aorta, aortic valve and coronary arteries. Heart is at the upper limits of normal in size. Left ventricle appears hypertrophied. No pericardial effusion. Mediastinum/Nodes: Subcentimeter low-attenuation lesions in the left thyroid. No follow-up recommended. (Ref: J Am Coll Radiol. 2015 Feb;12(2): 143-50).No pathologically enlarged mediastinal, hilar or axillary lymph nodes. Esophagus is grossly unremarkable. Lungs/Pleura: Right upper lobectomy with post radiation parenchymal retraction and bronchiectasis in the perihilar right lung. Ground-glass nodule in the superior segment right lower lobe measures 1.1 x 1.6 cm (5/28), stable. No new pulmonary nodules. No pleural fluid. Airway is otherwise unremarkable. Upper Abdomen: Liver, gallbladder and adrenal glands are unremarkable. Low-attenuation lesions in the left kidney. No specific follow-up necessary. Visualized portions of the kidneys, spleen, pancreas, stomach and bowel are otherwise grossly unremarkable. No upper abdominal adenopathy. Musculoskeletal: Degenerative changes in the spine. No worrisome lytic or sclerotic lesions. Old right rib fracture. IMPRESSION: 1. Right upper lobectomy with post radiation scarring in the right hemithorax. No evidence of recurrent or metastatic disease. 2. Stable ground-glass nodule in the superior segment right lower lobe. Recommend continued attention on follow-up as adenocarcinoma cannot be excluded. 3. Aortic atherosclerosis (ICD10-I70.0). Coronary artery calcification. Electronically  Signed   By: Lorin Picket M.D.   On: 01/25/2023 14:17    ASSESSMENT AND PLAN: This is a very pleasant 88 years old white male with history of stage IIIa non-small cell lung cancer, adenocarcinoma with positive EGFR mutation in exon 19 diagnosed in April 2014.  Status post right upper lobectomy with lymph node dissection followed by 4 cycles of adjuvant systemic chemotherapy followed by adjuvant radiation to the mediastinum. The patient has been in observation since that time and he is feeling fine with no concerning complaints. He had repeat CT scan of the chest performed recently.  I personally and independently reviewed the scan and discussed the results with the patient today. His scan showed no concerning findings for disease recurrence or metastasis. I recommended for him to continue on  observation with repeat CT scan of the chest in 1 year. He was advised to call immediately if he has any concerning symptoms in the interval. The patient voices understanding of current disease status and treatment options and is in agreement with the current care plan. All questions were answered. The patient knows to call the clinic with any problems, questions or concerns. We can certainly see the patient much sooner if necessary.   Disclaimer: This note was dictated with voice recognition software. Similar sounding words can inadvertently be transcribed and may not be corrected upon review.

## 2023-02-04 ENCOUNTER — Ambulatory Visit (HOSPITAL_COMMUNITY)
Admission: RE | Admit: 2023-02-04 | Discharge: 2023-02-04 | Disposition: A | Discharge status: Home / Self Care | Payer: Federal, State, Local not specified - PPO | Source: Ambulatory Visit | Attending: Internal Medicine | Admitting: Internal Medicine

## 2023-02-04 ENCOUNTER — Telehealth: Payer: Self-pay | Admitting: Podiatry

## 2023-02-04 DIAGNOSIS — D751 Secondary polycythemia: Secondary | ICD-10-CM | POA: Diagnosis present

## 2023-02-04 NOTE — Progress Notes (Signed)
Pt here for therapeutic phlebotomy. Removed 473 ml blood per MD order from left AC area. VSS. Pt tolerated well.

## 2023-02-04 NOTE — Telephone Encounter (Signed)
Pt called and canceled his appt on 4.22 and does not want to reschedule. He is unhappy with the bill he received and states he was told it would be covered by insurance and it was not. He did speak to billing and they explained but he will be going to get his nails done at the place his wife goes and they do a better job per pt.

## 2023-02-21 ENCOUNTER — Ambulatory Visit: Payer: Medicare Other | Admitting: Podiatry

## 2023-03-07 ENCOUNTER — Ambulatory Visit (HOSPITAL_COMMUNITY)
Admission: RE | Admit: 2023-03-07 | Discharge: 2023-03-07 | Disposition: A | Discharge status: Home / Self Care | Payer: Federal, State, Local not specified - PPO | Source: Ambulatory Visit | Attending: Surgery | Admitting: Surgery

## 2023-03-07 ENCOUNTER — Other Ambulatory Visit (HOSPITAL_COMMUNITY): Payer: Self-pay | Admitting: Registered Nurse

## 2023-03-07 DIAGNOSIS — R600 Localized edema: Secondary | ICD-10-CM

## 2023-03-10 ENCOUNTER — Other Ambulatory Visit: Payer: Self-pay | Admitting: *Deleted

## 2023-03-10 DIAGNOSIS — M7989 Other specified soft tissue disorders: Secondary | ICD-10-CM

## 2023-03-18 ENCOUNTER — Ambulatory Visit (HOSPITAL_COMMUNITY)
Admission: RE | Admit: 2023-03-18 | Discharge: 2023-03-18 | Disposition: A | Discharge status: Home / Self Care | Payer: Federal, State, Local not specified - PPO | Source: Ambulatory Visit | Attending: Vascular Surgery | Admitting: Vascular Surgery

## 2023-03-18 DIAGNOSIS — M7989 Other specified soft tissue disorders: Secondary | ICD-10-CM | POA: Diagnosis not present

## 2023-03-24 ENCOUNTER — Ambulatory Visit: Payer: Federal, State, Local not specified - PPO | Admitting: Physician Assistant

## 2023-03-24 VITALS — BP 138/74 | HR 100 | Temp 98.2°F | Resp 18 | Ht 70.0 in | Wt 189.0 lb

## 2023-03-24 DIAGNOSIS — I872 Venous insufficiency (chronic) (peripheral): Secondary | ICD-10-CM

## 2023-03-24 NOTE — Progress Notes (Signed)
[facility] [place]   Reason for referral: Swollen left > right leg  History of Present Illness  Alvin Hardy is a 88 y.o. male who presents with chief complaint: swollen leg.  Patient notes, onset of swelling several months ago, associated with sedentary lifestyle, prolonged sitting.  The patient has had no history of DVT, no history of varicose vein, no history of venous stasis ulcers, no history of  Lymphedema and positive mild erythema after skin cancer removed.  history of skin changes in lower legs.  There is unknown family history of venous disorders.  The patient has  used compression stockings in the past.  He had spinal surgery Jan 2024 and notice left LE edema.  He was placed on lasix which seems to have helped.  He then started elevating his legs over the past few weeks which to away the edema.  He has history of left partial fifth digit amputation and pre-ulcerative callus in that area. He denies claudication,rest pain and non healing wounds.  He also complains of right knee pain due to osteoarthritis.      Past Medical History:  Diagnosis Date   Allergy    Arthritis    psoriatic arthritis, ? , treated /w mmethotrexate    Bipolar 1 disorder (HCC)    BPH (benign prostatic hyperplasia)    Cataract 11/01/2008   Bilateral   Chest pain    Depression    Deviated septum    TO THE LEFT   ED (erectile dysfunction)    Essential hypertension 06/28/2021   Finger injury    mallet finger-- 02/13/2013, cast in place, followed by dr. Merlyn Lot   GERD (gastroesophageal reflux disease)    Gilbert's syndrome    History of kidney stones    History of radiation therapy 07/24/13-08/27/13   50Gy/25 fx chest   Hyperlipidemia    Hypogonadism male    Laryngopharyngeal reflux    lung ca dx'd 12/2012   rul   Lung mass 02/07/2013   RIGHT UPPER LOBE   Morton's neuroma    LEFT FOOT   Neuromuscular disorder (HCC)    peripheral neuropathy   Pneumonia    Shortness of breath     voice breaks, ? related to reflux, although told by Dr. Christella Hartigan, S.- early 85 yr. old    Past Surgical History:  Procedure Laterality Date   CYSTOSCOPY N/A 02/21/2013   Procedure: CYSTOSCOPY FLEXIBLE with insertion of foley catheter;  Surgeon: Antony Haste, MD;  Location: [facility] OR;  Service: Urology;  Laterality: N/A;   EYE SURGERY     cataracts removed fr. both eyes, IOL in place    KNEE ARTHROSCOPY Right 01/2007   LUMBAR LAMINECTOMY/DECOMPRESSION MICRODISCECTOMY Bilateral 11/17/2022   Procedure: L34, L45 LAM/FORAM;  Surgeon: Tressie Stalker, MD;  Location: [facility] OR;  Service: Neurosurgery;  Laterality: Bilateral;  3C   PILONIDAL CYST EXCISION  1960's   PROSTATE SURGERY  1996   TURP   VIDEO ASSISTED THORACOSCOPY (VATS)/WEDGE RESECTION Right 02/21/2013   Procedure: Right VIDEO ASSISTED THORACOSCOPY ,Thoracotomy with right upper lobectomy, node sampling ;  Surgeon: Delight Ovens, MD;  Location: [facility] OR;  Service: Thoracic;  Laterality: Right;   VIDEO BRONCHOSCOPY N/A 02/21/2013   Procedure: VIDEO BRONCHOSCOPY;  Surgeon: Delight Ovens, MD;  Location: [facility] OR;  Service: Thoracic;  Laterality: N/A;    Social History   Socioeconomic History   Marital status: Married    Spouse name: Not on file   Number of  children: Not on file   Years of education: Not on file   Highest education level: Not on file  Occupational History   Not on file  Tobacco Use   Smoking status: Former    Packs/day: 0.50    Years: 24.00    Additional pack years: 0.00    Total pack years: 12.00    Types: Cigarettes, Pipe    Quit date: 11/01/1978    Years since quitting: 44.4   Smokeless tobacco: Never  Vaping Use   Vaping Use: Never used  Substance and Sexual Activity   Alcohol use: Yes    Comment: occas.   Drug use: No   Sexual activity: Not on file  Other Topics Concern   Not on file  Social History Narrative   Not on file   Social Determinants of Health   Financial Resource Strain: Not on  file  Food Insecurity: Not on file  Transportation Needs: Not on file  Physical Activity: Not on file  Stress: Not on file  Social Connections: Not on file  Intimate Partner Violence: Not on file    Family History  Problem Relation Age of Onset   Diabetes Mother    Cancer Mother        abdomen   Heart disease Father    Diabetes Son    Cancer Maternal Aunt        lung ca, smoker    Current Outpatient Medications on File Prior to Visit  Medication Sig Dispense Refill   aspirin EC 81 MG tablet Take 81 mg by mouth every morning.      Cholecalciferol (VITAMIN D-3) 1000 UNITS CAPS Take 1,000 Units by mouth every morning.      clonazePAM (KLONOPIN) 0.5 MG tablet Take 1 tablet (0.5 mg total) by mouth at bedtime. 30 tablet 0   cyanocobalamin 1000 MCG tablet Take 1,000 mcg by mouth every morning.      cyclobenzaprine (FLEXERIL) 5 MG tablet Take 1 tablet (5 mg total) by mouth 3 (three) times daily as needed for muscle spasms. 30 tablet 0   docusate sodium (COLACE) 100 MG capsule Take 1 capsule (100 mg total) by mouth 2 (two) times daily. 30 capsule 0   esomeprazole (NEXIUM) 40 MG capsule Take 40 mg by mouth daily before breakfast.     finasteride (PROSCAR) 5 MG tablet Take 5 mg by mouth daily before breakfast.      furosemide (LASIX) 20 MG tablet TAKE 1 TABLET(20 MG) BY MOUTH DAILY (Patient taking differently: Take 20-40 mg by mouth See admin instructions. Take 20 mg by mouth every other day, alternating with 40 mg on alternate days) 90 tablet 0   gabapentin (NEURONTIN) 300 MG capsule Take 600-900 mg by mouth See admin instructions. Take 600 mg by mouth every morning and 900 mg at bedtime.     galantamine (RAZADYNE ER) 8 MG 24 hr capsule Take 8 mg by mouth daily.     HYDROmorphone (DILAUDID) 2 MG tablet Take 1-2 tablets (2-4 mg total) by mouth every 4 (four) hours as needed for severe pain or moderate pain. 30 tablet 0   lithium carbonate (LITHOBID) 300 MG CR tablet Take 600 mg by mouth at  bedtime.      potassium chloride (KLOR-CON) 10 MEQ tablet Take 10 mEq by mouth daily.     REPATHA SURECLICK 140 MG/ML SOAJ Inject 140 mg as directed every 14 (fourteen) days.     tamsulosin (FLOMAX) 0.4 MG CAPS capsule Take 0.4 mg  by mouth daily.  3   testosterone cypionate (DEPOTESTOTERONE CYPIONATE) 200 MG/ML injection Inject 200 mg into the muscle every 28 (twenty-eight) days.  1   umeclidinium-vilanterol (ANORO ELLIPTA) 62.5-25 MCG/ACT AEPB Inhale 1 puff into the lungs daily as needed (shortness of breath).     No current facility-administered medications on file prior to visit.    Allergies as of 03/24/2023 - Review Complete 02/04/2023  Allergen Reaction Noted   Codeine Anaphylaxis 02/15/2013   Hydrocodone-acetaminophen  07/10/2018   Omeprazole magnesium  07/10/2018   Tramadol  07/10/2018   Bacitracin  07/08/2021   Coenzyme q10  07/08/2021   Decadron [dexamethasone]  05/08/2013   Fenofibrate  07/10/2018   Hydrocodone  07/08/2021   Neomycin-bacitracin zn-polymyx  07/10/2018   Other  02/20/2013   Penicillin g  07/10/2018   Polymyxin b  07/08/2021   Propoxyphene  07/10/2018   Statins  02/19/2014   Ubidecarenone Other (See Comments) 07/10/2018   Zetia [ezetimibe]  02/19/2014   Zoloft [sertraline hcl] Other (See Comments) 02/15/2013     ROS:   General:  No weight loss, Fever, chills  HEENT: No recent headaches, no nasal bleeding, no visual changes, no sore throat  Neurologic: No dizziness, blackouts, seizures. No recent symptoms of stroke or mini- stroke. No recent episodes of slurred speech, or temporary blindness.  Cardiac: No recent episodes of chest pain/pressure, no shortness of breath at rest.  No shortness of breath with exertion.  Denies history of atrial fibrillation or irregular heartbeat  Vascular: No history of rest pain in feet.  No history of claudication.  No history of non-healing ulcer, No history of DVT   Pulmonary: No home oxygen, no productive cough,  no hemoptysis,  No asthma or wheezing  Musculoskeletal:  [ x] Arthritis, [ ]  Low back pain,  [x ] Joint pain  Hematologic:No history of hypercoagulable state.  No history of easy bleeding.  No history of anemia  Gastrointestinal: No hematochezia or melena,  No gastroesophageal reflux, no trouble swallowing  Urinary: [ ]  chronic Kidney disease, [ ]  on HD - [ ]  MWF or [ ]  TTHS, [ ]  Burning with urination, [ ]  Frequent urination, [ ]  Difficulty urinating;   Skin: No rashes  Psychological: No history of anxiety,  No history of depression  Physical Examination  Vitals:   03/24/23 1324  BP: 138/74  Pulse: 100  Resp: 18  Temp: 98.2 F (36.8 C)  TempSrc: Temporal  SpO2: 98%  Weight: 189 lb (85.7 kg)  Height: 5\' 10"  (1.778 m)    Body mass index is 27.12 kg/m.  General:  Alert and oriented, no acute distress HEENT: Normal Neck: No bruit or JVD Pulmonary: Clear to auscultation bilaterally Cardiac: Regular Rate and Rhythm without murmur Abdomen: Soft, non-tender, non-distended, no mass, no scars Skin: No rash erythema, brawny skin discoloration medial anterior shin after skin cancer removed. No ischemic skin changes Extremity Pulses:  radial,, femoral, dorsalis pedis, pulses bilaterally Musculoskeletal: No deformity or edema  Neurologic: Upper and lower extremity motor 5/5 and symmetric  DATA: RIGHT    CompressibilityPhasicitySpontaneityPropertiesThrombus  Aging  +---------+---------------+---------+-----------+----------+--------------+   CFV     Full           Yes      Yes                                   +---------+---------------+---------+-----------+----------+--------------+   SFJ  Full                    Yes                                   +---------+---------------+---------+-----------+----------+--------------+   FV Prox  Full           Yes      Yes                                    +---------+---------------+---------+-----------+----------+--------------+   FV Mid   Full           Yes      Yes                                   +---------+---------------+---------+-----------+----------+--------------+   FV DistalFull           Yes      Yes                                   +---------+---------------+---------+-----------+----------+--------------+   POP     Full           Yes      Yes                                   +---------+---------------+---------+-----------+----------+--------------+   PTV     Full                    Yes                                   +---------+---------------+---------+-----------+----------+--------------+   PERO    Full                    Yes                                   +---------+---------------+---------+-----------+----------+--------------+   GSV     Full           Yes      Yes                                   +---------+---------------+---------+-----------+----------+--------------+      +---------+---------------+---------+-----------+----------+--------------+   LEFT    CompressibilityPhasicitySpontaneityPropertiesThrombus  Aging  +---------+---------------+---------+-----------+----------+--------------+   CFV     Full           Yes      Yes                                   +---------+---------------+---------+-----------+----------+--------------+   SFJ     Full                    Yes                                   +---------+---------------+---------+-----------+----------+--------------+  FV Prox  Full           Yes      Yes                                   +---------+---------------+---------+-----------+----------+--------------+   FV Mid   Full           Yes      Yes                                   +---------+---------------+---------+-----------+----------+--------------+   FV DistalFull            Yes      Yes                                   +---------+---------------+---------+-----------+----------+--------------+   POP     Full           Yes      Yes                                   +---------+---------------+---------+-----------+----------+--------------+   PTV     Full                    Yes                                   +---------+---------------+---------+-----------+----------+--------------+   PERO    Full                    Yes                                   +---------+---------------+---------+-----------+----------+--------------+   GSV     Full           Yes      Yes                                   +---------+---------------+---------+-----------+----------+--------------+    Findings reported to [place] at 3:30 pm.    Summary:  RIGHT:  - There is no evidence of deep vein thrombosis in the lower extremity.  - There is no evidence of superficial venous thrombosis.    LEFT:  - There is no evidence of deep vein thrombosis in the lower extremity.  - There is no evidence of superficial venous thrombosis.    +--------------+---------+------+-----------+------------+--------+  LEFT         Reflux NoRefluxReflux TimeDiameter cmsComments                          Yes                                   +--------------+---------+------+-----------+------------+--------+  CFV                    yes   >1 second                       +--------------+---------+------+-----------+------------+--------+  FV mid        no                                              +--------------+---------+------+-----------+------------+--------+  Popliteal    no                                              +--------------+---------+------+-----------+------------+--------+  GSV at SFJ              yes    >500 ms      .485              +--------------+---------+------+-----------+------------+--------+   GSV prox thighno                            .369              +--------------+---------+------+-----------+------------+--------+  GSV mid thigh no                            .355              +--------------+---------+------+-----------+------------+--------+  GSV dist thighno        yes    >500 ms      .329              +--------------+---------+------+-----------+------------+--------+  GSV at knee             yes    >500 ms      .345              +--------------+---------+------+-----------+------------+--------+  GSV prox calf no                            .281              +--------------+---------+------+-----------+------------+--------+  GSV mid calf  no                            .285              +--------------+---------+------+-----------+------------+--------+  SSV Pop Fossa no                            .393              +--------------+---------+------+-----------+------------+--------+  SSV prox calf           yes    >500 ms      .365              +--------------+---------+------+-----------+------------+--------+  SSV mid calf            yes    >500 ms      .343              +--------------+---------+------+-----------+------------+--------+    Summary:  Left:  - No evidence of deep vein thrombosis seen in the left lower extremity,  from the common femoral through the popliteal veins.  - No evidence of superficial venous thrombosis in the left lower  extremity.  - Venous reflux is noted in the left common  femoral vein.  - Venous reflux is noted in the left sapheno-femoral junction.  - Venous reflux is noted in the left greater saphenous vein in the thigh.  - Venous reflux is noted in the left short saphenous vein.        Assessment/Plan:  Venous reflux with left > right edema - Venous reflux is noted in the left common femoral vein.  - Venous reflux is noted in the left sapheno-femoral junction.   - Venous reflux is noted in the left greater saphenous vein in the thigh.  - Venous reflux is noted in the left short saphenous vein.  The vein size is small< 0.4 from the SFJ down.  The reflux is mild and the vein size is not large enough to qualify for intervention.  He lives a sedentary life.  I recommend compression, elevation when at rest and exercise such as a stationary bike to Protect from knee pain due to osteoarthritis.  F/U PRN he and his wife agree with this plan of care.  He has palpable pedal pulse and is not at risk of limb loss.       Mosetta Pigeon PA-C [facility] [place] Office: [phone]  MD in clinic [place]

## 2023-03-25 ENCOUNTER — Encounter: Payer: Self-pay | Admitting: Physician Assistant

## 2023-04-01 ENCOUNTER — Ambulatory Visit (HOSPITAL_COMMUNITY)
Admission: RE | Admit: 2023-04-01 | Discharge: 2023-04-01 | Disposition: A | Discharge status: Home / Self Care | Payer: Federal, State, Local not specified - PPO | Source: Ambulatory Visit | Attending: Internal Medicine | Admitting: Internal Medicine

## 2023-04-01 DIAGNOSIS — K219 Gastro-esophageal reflux disease without esophagitis: Secondary | ICD-10-CM | POA: Diagnosis present

## 2023-04-01 LAB — POCT HEMOGLOBIN-HEMACUE: Hemoglobin: 15.2 g/dL (ref 13.0–17.0)

## 2023-05-11 ENCOUNTER — Ambulatory Visit (INDEPENDENT_AMBULATORY_CARE_PROVIDER_SITE_OTHER): Payer: Federal, State, Local not specified - PPO | Admitting: Podiatry

## 2023-05-11 ENCOUNTER — Encounter: Payer: Self-pay | Admitting: Podiatry

## 2023-05-11 DIAGNOSIS — L84 Corns and callosities: Secondary | ICD-10-CM | POA: Diagnosis not present

## 2023-05-11 DIAGNOSIS — M79675 Pain in left toe(s): Secondary | ICD-10-CM

## 2023-05-11 DIAGNOSIS — M79674 Pain in right toe(s): Secondary | ICD-10-CM | POA: Diagnosis not present

## 2023-05-11 DIAGNOSIS — B351 Tinea unguium: Secondary | ICD-10-CM

## 2023-05-11 DIAGNOSIS — R200 Anesthesia of skin: Secondary | ICD-10-CM | POA: Insufficient documentation

## 2023-05-11 DIAGNOSIS — Z9889 Other specified postprocedural states: Secondary | ICD-10-CM | POA: Insufficient documentation

## 2023-05-11 DIAGNOSIS — Z899 Acquired absence of limb, unspecified: Secondary | ICD-10-CM

## 2023-05-11 NOTE — Progress Notes (Signed)
  Subjective:  Patient ID: Alvin Hardy, male    DOB: 02/21/1935,   MRN: [id number]  Chief Complaint  Patient presents with   Debridement    Trim toenails/check 5th toe left - keeps getting caught on his socks    88 y.o. male presents for concern of thickened elongated and painful nails that are difficult to trim. Requesting to have them trimmed today. Relates burning and tingling in their feet. History of left partial fifth digit amputation and pre-ulcerative callus in that area.     PCP:  Rodrigo Ran, MD    . Denies any other pedal complaints. Denies n/v/f/c.   Past Medical History:  Diagnosis Date   Allergy    Arthritis    psoriatic arthritis, ? , treated /w mmethotrexate    Bipolar 1 disorder (HCC)    BPH (benign prostatic hyperplasia)    Cataract 11/01/2008   Bilateral   Chest pain    Depression    Deviated septum    TO THE LEFT   ED (erectile dysfunction)    Essential hypertension 06/28/2021   Finger injury    mallet finger-- 02/13/2013, cast in place, followed by dr. Merlyn Lot   GERD (gastroesophageal reflux disease)    Gilbert's syndrome    History of kidney stones    History of radiation therapy 07/24/13-08/27/13   50Gy/25 fx chest   Hyperlipidemia    Hypogonadism male    Laryngopharyngeal reflux    lung ca dx'd 12/2012   rul   Lung mass 02/07/2013   RIGHT UPPER LOBE   Morton's neuroma    LEFT FOOT   Neuromuscular disorder (HCC)    peripheral neuropathy   Pneumonia    Shortness of breath    voice breaks, ? related to reflux, although told by Dr. Christella Hartigan, S.- early 34 yr. old    Objective:  Physical Exam: Vascular: DP/PT pulses 2/4 bilateral. CFT <3 seconds. Absent hair growth on digits. Edema noted to bilateral lower extremities. Xerosis noted bilaterally.  Skin. No lacerations or abrasions bilateral feet. Nails 1-5 bilateral  are thickened discolored and elongated with subungual debris. Hyperkeratotic tissue noted over fifth left digit.  Musculoskeletal:  MMT 5/5 bilateral lower extremities in DF, PF, Inversion and Eversion. Deceased ROM in DF of ankle joint. Partial fifth digit amputation  Neurological: Sensation intact to light touch. Protective sensation diminished bilateral.    Assessment:   1. Pain due to onychomycosis of toenails of both feet   2. Pre-ulcerative calluses   3. History of amputation         Plan:  Patient was evaluated and treated and all questions answered. -Discussed and educated patient on foot care, especially with  regards to the vascular, neurological and musculoskeletal systems.  -Discussed supportive shoes at all times and checking feet regularly.  -Mechanically debrided all nails 1-5 bilateral using sterile nail nipper and filed with dremel without incident  -Answered all patient questions -Hyperkeratotic tissue trimmed without incident -Advised to keep padding on this area to prevent from worsening.  -Patient to return  in 10 weeks for at risk foot care -Patient advised to call the office if any problems or questions arise in the meantime.    Louann Sjogren, DPM

## 2023-05-23 ENCOUNTER — Other Ambulatory Visit (HOSPITAL_COMMUNITY): Payer: Self-pay | Admitting: *Deleted

## 2023-05-23 DIAGNOSIS — D751 Secondary polycythemia: Secondary | ICD-10-CM

## 2023-05-25 ENCOUNTER — Ambulatory Visit (HOSPITAL_COMMUNITY)
Admission: RE | Admit: 2023-05-25 | Discharge: 2023-05-25 | Disposition: A | Discharge status: Home / Self Care | Payer: Federal, State, Local not specified - PPO | Source: Ambulatory Visit | Attending: Internal Medicine | Admitting: Internal Medicine

## 2023-05-25 DIAGNOSIS — D751 Secondary polycythemia: Secondary | ICD-10-CM | POA: Diagnosis not present

## 2023-05-25 LAB — POCT HEMOGLOBIN-HEMACUE: Hemoglobin: 14.6 g/dL (ref 13.0–17.0)

## 2023-05-25 NOTE — Progress Notes (Signed)
Patient here for therapeutic phlebotomy. Hemocue 14.6. 473 ml removed as ordered from left AC. Patient tolerated well.

## 2023-05-27 ENCOUNTER — Encounter (HOSPITAL_COMMUNITY): Payer: Federal, State, Local not specified - PPO

## 2023-07-20 ENCOUNTER — Ambulatory Visit (INDEPENDENT_AMBULATORY_CARE_PROVIDER_SITE_OTHER): Payer: Federal, State, Local not specified - PPO | Admitting: Podiatry

## 2023-07-20 DIAGNOSIS — L84 Corns and callosities: Secondary | ICD-10-CM | POA: Diagnosis not present

## 2023-07-20 DIAGNOSIS — Z899 Acquired absence of limb, unspecified: Secondary | ICD-10-CM | POA: Diagnosis not present

## 2023-07-20 DIAGNOSIS — B351 Tinea unguium: Secondary | ICD-10-CM

## 2023-07-20 DIAGNOSIS — M79674 Pain in right toe(s): Secondary | ICD-10-CM

## 2023-07-20 DIAGNOSIS — M79675 Pain in left toe(s): Secondary | ICD-10-CM

## 2023-07-20 NOTE — Progress Notes (Signed)
Subjective:  Patient ID: Alvin Hardy, male    DOB: 22-Nov-1934,   MRN: [id number]  Chief Complaint  Patient presents with   routine foot care    88 y.o. male presents for concern of thickened elongated and painful nails that are difficult to trim. Requesting to have them trimmed today. Relates burning and tingling in their feet. History of left partial fifth digit amputation and pre-ulcerative callus in that area.     PCP:  Rodrigo Ran, MD    . Denies any other pedal complaints. Denies n/v/f/c.   Past Medical History:  Diagnosis Date   Allergy    Arthritis    psoriatic arthritis, ? , treated /w mmethotrexate    Bipolar 1 disorder (HCC)    BPH (benign prostatic hyperplasia)    Cataract 11/01/2008   Bilateral   Chest pain    Depression    Deviated septum    TO THE LEFT   ED (erectile dysfunction)    Essential hypertension 06/28/2021   Finger injury    mallet finger-- 02/13/2013, cast in place, followed by dr. Merlyn Lot   GERD (gastroesophageal reflux disease)    Gilbert's syndrome    History of kidney stones    History of radiation therapy 07/24/13-08/27/13   50Gy/25 fx chest   Hyperlipidemia    Hypogonadism male    Laryngopharyngeal reflux    lung ca dx'd 12/2012   rul   Lung mass 02/07/2013   RIGHT UPPER LOBE   Morton's neuroma    LEFT FOOT   Neuromuscular disorder (HCC)    peripheral neuropathy   Pneumonia    Shortness of breath    voice breaks, ? related to reflux, although told by Dr. Christella Hartigan, S.- early 89 yr. old    Objective:  Physical Exam: Vascular: DP/PT pulses 2/4 bilateral. CFT <3 seconds. Absent hair growth on digits. Edema noted to bilateral lower extremities. Xerosis noted bilaterally.  Skin. No lacerations or abrasions bilateral feet. Nails 1-5 bilateral  are thickened discolored and elongated with subungual debris. Hyperkeratotic tissue noted over fifth left digit.  Musculoskeletal: MMT 5/5 bilateral lower extremities in DF, PF, Inversion and  Eversion. Deceased ROM in DF of ankle joint. Partial fifth digit amputation  Neurological: Sensation intact to light touch. Protective sensation diminished bilateral.    Assessment:   1. Pain due to onychomycosis of toenails of both feet   2. Pre-ulcerative calluses   3. History of amputation         Plan:  Patient was evaluated and treated and all questions answered. -Discussed and educated patient on foot care, especially with  regards to the vascular, neurological and musculoskeletal systems.  -Discussed supportive shoes at all times and checking feet regularly.  -Mechanically debrided all nails 1-5 bilateral using sterile nail nipper and filed with dremel without incident  -Answered all patient questions -Hyperkeratotic tissue trimmed without incident -Advised to keep padding on this area to prevent from worsening.  -Patient to return  in 10 weeks for at risk foot care -Patient advised to call the office if any problems or questions arise in the meantime.    Louann Sjogren, DPM

## 2023-07-21 ENCOUNTER — Ambulatory Visit (HOSPITAL_COMMUNITY)
Admission: RE | Admit: 2023-07-21 | Discharge: 2023-07-21 | Disposition: A | Discharge status: Home / Self Care | Payer: Federal, State, Local not specified - PPO | Source: Ambulatory Visit | Attending: Internal Medicine | Admitting: Internal Medicine

## 2023-07-21 DIAGNOSIS — D751 Secondary polycythemia: Secondary | ICD-10-CM | POA: Insufficient documentation

## 2023-07-21 LAB — POCT HEMOGLOBIN-HEMACUE: Hemoglobin: 12.8 g/dL — ABNORMAL LOW (ref 13.0–17.0)

## 2023-07-21 NOTE — Progress Notes (Signed)
Patient here at [facility] for therapeutic phlebotomy. Hemocue 12.8 which is less than ordered 14. Called Dr. Laurey Morale office, spoke with Lawson Fiscal. Per Dr. Waynard Edwards, do not remove any blood today and will not return for 8 weeks as originally ordered. Patient verbalized understanding.

## 2023-08-10 ENCOUNTER — Ambulatory Visit: Payer: Federal, State, Local not specified - PPO | Admitting: Cardiovascular Disease

## 2023-09-14 ENCOUNTER — Other Ambulatory Visit (HOSPITAL_COMMUNITY): Payer: Self-pay | Admitting: *Deleted

## 2023-09-15 ENCOUNTER — Ambulatory Visit (HOSPITAL_COMMUNITY)
Admission: RE | Admit: 2023-09-15 | Discharge: 2023-09-15 | Disposition: A | Discharge status: Home / Self Care | Payer: Federal, State, Local not specified - PPO | Source: Ambulatory Visit | Attending: Internal Medicine | Admitting: Internal Medicine

## 2023-09-15 DIAGNOSIS — D751 Secondary polycythemia: Secondary | ICD-10-CM | POA: Insufficient documentation

## 2023-09-15 LAB — POCT HEMOGLOBIN-HEMACUE: Hemoglobin: 12.8 g/dL — ABNORMAL LOW (ref 13.0–17.0)

## 2023-09-20 ENCOUNTER — Ambulatory Visit: Payer: Federal, State, Local not specified - PPO | Attending: Cardiovascular Disease | Admitting: Cardiovascular Disease

## 2023-09-20 ENCOUNTER — Encounter: Payer: Self-pay | Admitting: Cardiovascular Disease

## 2023-09-20 VITALS — BP 124/66 | HR 95 | Ht 70.0 in | Wt 186.0 lb

## 2023-09-20 DIAGNOSIS — I10 Essential (primary) hypertension: Secondary | ICD-10-CM | POA: Diagnosis not present

## 2023-09-20 DIAGNOSIS — E782 Mixed hyperlipidemia: Secondary | ICD-10-CM

## 2023-09-20 DIAGNOSIS — R600 Localized edema: Secondary | ICD-10-CM | POA: Diagnosis not present

## 2023-09-20 NOTE — Progress Notes (Signed)
09/20/2023 Alvin Hardy   [birth year]  [id number]  Primary Physician Alvin Ran, MD Primary Cardiologist: Runell Gess MD Nicholes Calamity, [place]  HPI:  Alvin Hardy is a 88 y.o.  fit-appearing married Caucasian male father of 2 children, grandfather of 2 grandchildren referred by Dr. Rodrigo Hardy for cardiovascular evaluation.  I last saw him in the office 04/06/2022.  He is a retired Hospital doctor disability judge. His cardiovascular risk factor profile is markable for hyperlipidemia intolerant to statin drugs and family history of heart disease with a father who had a myocardial infarction at age 62. He is nonhypertensive, diabetic nor has she smoked in the in the past. He has never had a heart attack or stroke. He was getting occasional chest pain that occurred both with exertion as well as at rest and after eating. He has had lung cancer and has had a VATS procedure by Dr. Tyrone Sage of the right upper lobe along with chemotherapy and radiation therapy. He has seen a gastroenterologist and has had EGD and is going to be referred to Dr. Solon Augusta for pulmonary evaluation. A Myoview stress test was performed that was entirely normal as were her lower extremity Doppler studies. Because of the statin intolerance he was recently begun on Repatha by Dr. Waynard Edwards the marked reduction in his cholesterol levels. Over the last 6 weeks or so he's had progressive increase in frequency and severity of exertional chest pressure as well as dyspnea on exertion.  He did have a 2D echo and Myoview stress test which were essentially normal.  The chest pain which he was experiencing when I saw him last has resolved.  He was hospitalized for pneumonia from the end of August to the end of September (5 weeks.  He went to [facility] for several weeks and now is home.  He has noticed some left lower extremities edema on furosemide.  A 2D echocardiogram performed 06/30/2021 revealed a slight decline in LV function with an  EF of 45 to 50% with grade 2 diastolic dysfunction.  There are no valvular abnormalities noted.  Since I saw him a year and a half ago he continues to do well.  Does have mild lower extremity edema treated with compression stockings and furosemide.  He also has venous reflux by venous Dopplers.  He had a 2D echo performed 11/23/2021 revealing low normal LV systolic function without significant valvular abnormalities.  He denies chest pain or shortness of breath.   Current Meds  Medication Sig   aspirin EC 81 MG tablet Take 81 mg by mouth every morning.    Cholecalciferol (VITAMIN D-3) 1000 UNITS CAPS Take 1,000 Units by mouth every morning.    clonazePAM (KLONOPIN) 0.5 MG tablet Take 1 tablet (0.5 mg total) by mouth at bedtime.   cyanocobalamin 1000 MCG tablet Take 1,000 mcg by mouth every morning.    cyclobenzaprine (FLEXERIL) 5 MG tablet Take 1 tablet (5 mg total) by mouth 3 (three) times daily as needed for muscle spasms.   docusate sodium (COLACE) 100 MG capsule Take 1 capsule (100 mg total) by mouth 2 (two) times daily.   esomeprazole (NEXIUM) 40 MG capsule Take 40 mg by mouth daily before breakfast.   finasteride (PROSCAR) 5 MG tablet Take 5 mg by mouth daily before breakfast.    furosemide (LASIX) 20 MG tablet TAKE 1 TABLET(20 MG) BY MOUTH DAILY (Patient taking differently: Take 20-40 mg by mouth See admin instructions. Take 20 mg by mouth  every other day, alternating with 40 mg on alternate days)   gabapentin (NEURONTIN) 300 MG capsule Take 600-900 mg by mouth See admin instructions. Take 600 mg by mouth every morning and 900 mg at bedtime.   galantamine (RAZADYNE ER) 8 MG 24 hr capsule Take 8 mg by mouth daily.   lithium carbonate (LITHOBID) 300 MG CR tablet Take 300 mg by mouth at bedtime.   potassium chloride (KLOR-CON) 10 MEQ tablet Take 10 mEq by mouth daily.   REPATHA SURECLICK 140 MG/ML SOAJ Inject 140 mg as directed every 14 (fourteen) days.   tamsulosin (FLOMAX) 0.4 MG CAPS  capsule Take 0.4 mg by mouth daily.   testosterone cypionate (DEPOTESTOTERONE CYPIONATE) 200 MG/ML injection Inject 200 mg into the muscle every 28 (twenty-eight) days.   umeclidinium-vilanterol (ANORO ELLIPTA) 62.5-25 MCG/ACT AEPB Inhale 1 puff into the lungs daily as needed (shortness of breath).     Allergies  Allergen Reactions   Codeine Anaphylaxis   Hydrocodone-Acetaminophen     Reactions unknown   Omeprazole Magnesium     Reaction Unknown   Tramadol     Reactions Unknown   Bacitracin    Coenzyme Q10    Decadron [Dexamethasone]     Pt says it is contraindication due to the lithium he is taking   Fenofibrate     Other reaction(s): Other (See Comments) Reactions Unknown   Hydrocodone    Neomycin-Bacitracin Zn-Polymyx     Reactions Unknown   Other     Pt. Remarks that all pain med. Make him nauseated    Penicillin G     Reactions Unknown   Polymyxin B    Propoxyphene     Other reaction(s): Other (See Comments) Reaction Unknown   Statins     Lipitor, Crestor, Zocor all caused muscle aches   Ubidecarenone Other (See Comments)    Other reaction(s): Other (See Comments)  Reactions Unknown   Zetia [Ezetimibe]     Muscle aches   Zoloft [Sertraline Hcl] Other (See Comments)    REACTION:  unknown    Social History   Socioeconomic History   Marital status: Married    Spouse name: Not on file   Number of children: Not on file   Years of education: Not on file   Highest education level: Not on file  Occupational History   Not on file  Tobacco Use   Smoking status: Former    Current packs/day: 0.00    Average packs/day: 0.5 packs/day for 24.0 years (12.0 ttl pk-yrs)    Types: Cigarettes, Pipe    Start date: 11/01/1954    Quit date: 11/01/1978    Years since quitting: 44.9   Smokeless tobacco: Never  Vaping Use   Vaping status: Never Used  Substance and Sexual Activity   Alcohol use: Yes    Comment: occas.   Drug use: No   Sexual activity: Not on file  Other  Topics Concern   Not on file  Social History Narrative   Not on file   Social Determinants of Health   Financial Resource Strain: Not on file  Food Insecurity: Not on file  Transportation Needs: Not on file  Physical Activity: Not on file  Stress: Not on file  Social Connections: Not on file  Intimate Partner Violence: Not on file     Review of Systems: General: negative for chills, fever, night sweats or weight changes.  Cardiovascular: negative for chest pain, dyspnea on exertion, edema, orthopnea, palpitations, paroxysmal nocturnal dyspnea or  shortness of breath Dermatological: negative for rash Respiratory: negative for cough or wheezing Urologic: negative for hematuria Abdominal: negative for nausea, vomiting, diarrhea, bright red blood per rectum, melena, or hematemesis Neurologic: negative for visual changes, syncope, or dizziness All other systems reviewed and are otherwise negative except as noted above.    Blood pressure 124/66, pulse 95, height 5\' 10"  (1.778 m), weight 186 lb (84.4 kg), SpO2 96%.  General appearance: alert and no distress Neck: no adenopathy, no carotid bruit, no JVD, supple, symmetrical, trachea midline, and thyroid not enlarged, symmetric, no tenderness/mass/nodules Lungs: clear to auscultation bilaterally Heart: regular rate and rhythm, S1, S2 normal, no murmur, click, rub or gallop Extremities: extremities normal, atraumatic, no cyanosis or edema Pulses: 2+ and symmetric Skin: Skin color, texture, turgor normal. No rashes or lesions Neurologic: Grossly normal  EKG EKG Interpretation Date/Time:  Tuesday September 20 2023 11:31:39 EST Ventricular Rate:  95 PR Interval:  224 QRS Duration:  134 QT Interval:  392 QTC Calculation: 492 R Axis:   233  Text Interpretation: Sinus rhythm with 1st degree A-V block Possible Left atrial enlargement Right bundle branch block Inferior infarct , age undetermined When compared with ECG of 28-Jun-2021  01:07, PREVIOUS ECG IS PRESENT Confirmed by Nanetta Batty [phone]) on 09/20/2023 12:20:45 PM    ASSESSMENT AND PLAN:   Hyperlipidemia History of hyperlipidemia on Repatha with lipid profile performed 09/28/2022 revealing total Lester 115, LDL 36 and HDL 53.  Essential hypertension History of essential hypertension blood pressure measured today at 124/66.  He is not on antihypertensive medications.  Bilateral lower extremity edema History bilateral lower extremity edema on compression stockings and furosemide with venous Dopplers that showed venous reflux.     Runell Gess MD FACP,FACC,FAHA, [facility] 09/20/2023 12:31 PM

## 2023-09-20 NOTE — Assessment & Plan Note (Signed)
History bilateral lower extremity edema on compression stockings and furosemide with venous Dopplers that showed venous reflux.

## 2023-09-20 NOTE — Assessment & Plan Note (Signed)
History of essential hypertension blood pressure measured today at 124/66.  He is not on antihypertensive medications.

## 2023-09-20 NOTE — Assessment & Plan Note (Signed)
History of hyperlipidemia on Repatha with lipid profile performed 09/28/2022 revealing total Lester 115, LDL 36 and HDL 53.

## 2023-09-20 NOTE — Patient Instructions (Signed)

## 2023-10-03 ENCOUNTER — Ambulatory Visit: Payer: Medicare Other | Admitting: Podiatry

## 2023-10-06 ENCOUNTER — Emergency Department (HOSPITAL_COMMUNITY)
Admission: EM | Admit: 2023-10-06 | Discharge: 2023-10-06 | Disposition: A | Discharge status: Home / Self Care | Payer: Federal, State, Local not specified - PPO | Attending: Emergency Medicine | Admitting: Emergency Medicine

## 2023-10-06 ENCOUNTER — Emergency Department (HOSPITAL_COMMUNITY): Payer: Federal, State, Local not specified - PPO

## 2023-10-06 ENCOUNTER — Other Ambulatory Visit: Payer: Self-pay

## 2023-10-06 DIAGNOSIS — Z7982 Long term (current) use of aspirin: Secondary | ICD-10-CM | POA: Diagnosis not present

## 2023-10-06 DIAGNOSIS — R051 Acute cough: Secondary | ICD-10-CM | POA: Diagnosis not present

## 2023-10-06 DIAGNOSIS — Z1152 Encounter for screening for COVID-19: Secondary | ICD-10-CM | POA: Insufficient documentation

## 2023-10-06 DIAGNOSIS — R0981 Nasal congestion: Secondary | ICD-10-CM | POA: Diagnosis not present

## 2023-10-06 DIAGNOSIS — M545 Low back pain, unspecified: Secondary | ICD-10-CM | POA: Insufficient documentation

## 2023-10-06 LAB — SARS CORONAVIRUS 2 BY RT PCR: SARS Coronavirus 2 by RT PCR: NEGATIVE

## 2023-10-06 MED ORDER — BENZONATATE 100 MG PO CAPS: 100.0000 mg | ORAL_CAPSULE | Freq: Three times a day (TID) | ORAL | 0 refills | Status: DC

## 2023-10-06 MED ORDER — LIDOCAINE 5 % EX PTCH
1.0000 | MEDICATED_PATCH | CUTANEOUS | Status: DC
  Administered 2023-10-06: 1 via TRANSDERMAL
  Filled 2023-10-06: qty 1

## 2023-10-06 MED ORDER — BENZONATATE 100 MG PO CAPS
100.0000 mg | ORAL_CAPSULE | Freq: Once | ORAL | Status: AC
  Administered 2023-10-06: 100 mg via ORAL
  Filled 2023-10-06: qty 1

## 2023-10-06 MED ORDER — LIDOCAINE 5 % EX PTCH: 1.0000 | MEDICATED_PATCH | CUTANEOUS | 0 refills | Status: DC

## 2023-10-06 MED ORDER — CYCLOBENZAPRINE HCL 10 MG PO TABS
5.0000 mg | ORAL_TABLET | Freq: Once | ORAL | Status: DC
  Filled 2023-10-06: qty 1

## 2023-10-06 MED ORDER — OXYCODONE HCL 5 MG PO TABS
10.0000 mg | ORAL_TABLET | Freq: Once | ORAL | Status: AC
  Administered 2023-10-06: 10 mg via ORAL
  Filled 2023-10-06: qty 2

## 2023-10-06 NOTE — ED Triage Notes (Addendum)
Pt. Stated, Alvin Hardy had back pain started 3-4 days ago but I've had this awful cough that is nonstop. I called Dr. Lovell Sheehan and he said to come here. I had back surgery a year ago and I had PNA 2 years ago so it worries me.

## 2023-10-06 NOTE — ED Provider Notes (Signed)
Alvin Hardy EMERGENCY DEPARTMENT AT [facility] Provider Note   CSN: [id number] Arrival date & time: 10/06/23  7829     History Chief Complaint  Patient presents with   Cough   Nasal Congestion   Back Pain    Alvin Hardy is a 88 y.o. male patient with history of laminectomy who presents to the emerged permit with right lower back pain that started 1 day ago.  Patient states that the last 3 to 4 days she has been having a dry nonproductive cough.  Shortly after having the cough and excessively coughing he began having right lower back pain.  Pain does not radiate.  He denies any bowel or bladder incontinence.  He denies fever or chills.   Cough Back Pain      Home Medications Prior to Admission medications   Medication Sig Start Date End Date Taking? Authorizing Provider  benzonatate (TESSALON) 100 MG capsule Take 1 capsule (100 mg total) by mouth every 8 (eight) hours. 10/06/23  Yes Nolan Lasser M, PA-C  lidocaine (LIDODERM) 5 % Place 1 patch onto the skin daily. Remove & Discard patch within 12 hours or as directed by MD 10/06/23  Yes Teressa Lower, PA-C  aspirin EC 81 MG tablet Take 81 mg by mouth every morning.     [provider]  Cholecalciferol (VITAMIN D-3) 1000 UNITS CAPS Take 1,000 Units by mouth every morning.     [provider]  clonazePAM (KLONOPIN) 0.5 MG tablet Take 1 tablet (0.5 mg total) by mouth at bedtime. 07/08/21   Noralee Stain, DO  cyanocobalamin 1000 MCG tablet Take 1,000 mcg by mouth every morning.     [provider]  cyclobenzaprine (FLEXERIL) 5 MG tablet Take 1 tablet (5 mg total) by mouth 3 (three) times daily as needed for muscle spasms. 11/18/22   Tressie Stalker, MD  docusate sodium (COLACE) 100 MG capsule Take 1 capsule (100 mg total) by mouth 2 (two) times daily. 11/18/22   Tressie Stalker, MD  esomeprazole (NEXIUM) 40 MG capsule Take 40 mg by mouth daily before breakfast.    [provider]   finasteride (PROSCAR) 5 MG tablet Take 5 mg by mouth daily before breakfast.     [provider]  furosemide (LASIX) 20 MG tablet TAKE 1 TABLET(20 MG) BY MOUTH DAILY Patient taking differently: Take 20-40 mg by mouth See admin instructions. Take 20 mg by mouth every other day, alternating with 40 mg on alternate days 08/30/21   Omar Person, MD  gabapentin (NEURONTIN) 300 MG capsule Take 600-900 mg by mouth See admin instructions. Take 600 mg by mouth every morning and 900 mg at bedtime.    [provider]  galantamine (RAZADYNE ER) 8 MG 24 hr capsule Take 8 mg by mouth daily.    [provider]  HYDROmorphone (DILAUDID) 2 MG tablet Take 1-2 tablets (2-4 mg total) by mouth every 4 (four) hours as needed for severe pain or moderate pain. Patient not taking: Reported on 09/20/2023 11/18/22   Tressie Stalker, MD  lithium carbonate (LITHOBID) 300 MG CR tablet Take 300 mg by mouth at bedtime.    [provider]  potassium chloride (KLOR-CON) 10 MEQ tablet Take 10 mEq by mouth daily. 09/07/21   [provider]  REPATHA SURECLICK 140 MG/ML SOAJ Inject 140 mg as directed every 14 (fourteen) days. 05/28/16   [provider]  tamsulosin (FLOMAX) 0.4 MG CAPS capsule Take 0.4 mg by mouth daily.  12/19/14   [provider]  testosterone cypionate (DEPOTESTOTERONE CYPIONATE) 200 MG/ML injection Inject 200 mg into the muscle every 28 (twenty-eight) days. 11/05/14   [provider]  umeclidinium-vilanterol (ANORO ELLIPTA) 62.5-25 MCG/ACT AEPB Inhale 1 puff into the lungs daily as needed (shortness of breath).    [provider]      Allergies    Codeine, Hydrocodone-acetaminophen, Omeprazole magnesium, Tramadol, Bacitracin, Coenzyme q10, Decadron [dexamethasone], Fenofibrate, Hydrocodone, Neomycin-bacitracin zn-polymyx, Other, Penicillin g, Polymyxin b, Propoxyphene, Statins, Ubidecarenone, Zetia [ezetimibe], and Zoloft [sertraline  hcl]    Review of Systems   Review of Systems  Respiratory:  Positive for cough.   Musculoskeletal:  Positive for back pain.  All other systems reviewed and are negative.   Physical Exam Updated Vital Signs BP (!) 158/90 (BP Location: Right Arm)   Pulse (!) 106   Temp 98.5 F (36.9 C)   Resp 18   SpO2 95%  Physical Exam Vitals and nursing note reviewed.  Constitutional:      Appearance: Normal appearance.  HENT:     Head: Normocephalic and atraumatic.  Eyes:     General:        Right eye: No discharge.        Left eye: No discharge.     Conjunctiva/sclera: Conjunctivae normal.  Pulmonary:     Effort: Pulmonary effort is normal.  Musculoskeletal:     Comments: There is no tenderness over the lumbar spine or paralumbar musculature.  Normal sensation to the lower extremities.  Strength is intact and equal.  Skin:    General: Skin is warm and dry.     Findings: No rash.  Neurological:     General: No focal deficit present.     Mental Status: He is alert.  Psychiatric:        Mood and Affect: Mood normal.        Behavior: Behavior normal.     ED Results / Procedures / Treatments   Labs (all labs ordered are listed, but only abnormal results are displayed) Labs Reviewed  SARS CORONAVIRUS 2 BY RT PCR    EKG None  Radiology DG Chest 2 View  Result Date: 10/06/2023 CLINICAL DATA:  Cough. EXAM: CHEST - 2 VIEW COMPARISON:  Radiograph from 07/04/2021 and chest CT scan from 01/24/2023. FINDINGS: Redemonstration of right parahilar opacity, which is unchanged since several prior studies and corresponds to fibrosis/scarring from prior post radiation/treatment changes on prior CT scan. There is associated tenting of right hemidiaphragm. Bilateral lung fields are otherwise clear. No acute consolidation or lung collapse. Bilateral costophrenic angles are clear. Normal cardio-mediastinal silhouette. No acute osseous abnormalities. The soft tissues are within normal limits.  IMPRESSION: *No active cardiopulmonary disease. Stable posttreatment changes in the right parahilar region, as described above. Electronically Signed   By: Jules Schick M.D.   On: 10/06/2023 11:43    Procedures Procedures    Medications Ordered in ED Medications  cyclobenzaprine (FLEXERIL) tablet 5 mg (has no administration in time range)  lidocaine (LIDODERM) 5 % 1 patch (1 patch Transdermal Patch Applied 10/06/23 2115)  oxyCODONE (Oxy IR/ROXICODONE) immediate release tablet 10 mg (10 mg Oral Given 10/06/23 2115)  benzonatate (TESSALON) capsule 100 mg (100 mg Oral Given 10/06/23 2115)    ED Course/ Medical Decision Making/ A&P   {   Click here for ABCD2, HEART and other calculators  Medical Decision Making Alvin Hardy is a 88 y.o. male patient who presents to the emergency department today for  further evaluation of right lower back pain and cough.  Cough is likely from viral bronchitis.  COVID was negative and chest x-ray was negative for pneumonia.  With regards to his back have a low suspicion for cauda equina syndrome or deep-seated infection at this time.  Will treat conservatively for muscular strain likely from coughing.  Lidocaine patches prescribed and Tessalon Perles.  Strict return precautions were discussed.  He is safe for discharge.   Risk Prescription drug management.    Final Clinical Impression(s) / ED Diagnoses Final diagnoses:  Acute cough  Acute right-sided low back pain without sciatica    Rx / DC Orders ED Discharge Orders          Ordered    benzonatate (TESSALON) 100 MG capsule  Every 8 hours        10/06/23 2109    lidocaine (LIDODERM) 5 %  Every 24 hours        10/06/23 2109              Teressa Lower, PA-C 10/06/23 2120    Anders Simmonds T, DO 10/08/23 [phone]

## 2023-10-06 NOTE — ED Notes (Signed)
Patient complaining of increased pain especially after ambulating. Patient states that their pain is a 10/10, triage RN notified.

## 2023-10-06 NOTE — Discharge Instructions (Addendum)
Please take Tessalon Perles as prescribed.  You can use lidocaine patches as prescribed as well for your back.  Please follow-up with your primary care doctor.  You may return to the emerged apartment for any worsening symptoms.

## 2023-11-10 ENCOUNTER — Ambulatory Visit (HOSPITAL_COMMUNITY)
Admission: RE | Admit: 2023-11-10 | Discharge: 2023-11-10 | Disposition: A | Discharge status: Home / Self Care | Payer: Federal, State, Local not specified - PPO | Source: Ambulatory Visit | Attending: Internal Medicine | Admitting: Internal Medicine

## 2023-11-10 DIAGNOSIS — D751 Secondary polycythemia: Secondary | ICD-10-CM | POA: Diagnosis present

## 2023-11-10 LAB — POCT HEMOGLOBIN-HEMACUE: Hemoglobin: 12 g/dL — ABNORMAL LOW (ref 13.0–17.0)

## 2023-11-10 NOTE — Progress Notes (Signed)
 Pt here today for phlebotomy.  Hbg is 12.0 which is not in range for phlebotomy per orders.  Pt has appointment with Dr Shayne Feb 28th and his next phlebotomy is scheduled for March 6th.  Pt has been held last 3 times.  Dates and hgb levels written down for pt to discuss with Dr Shayne at his appointment.

## 2023-12-23 ENCOUNTER — Other Ambulatory Visit (HOSPITAL_BASED_OUTPATIENT_CLINIC_OR_DEPARTMENT_OTHER): Payer: Self-pay | Admitting: Internal Medicine

## 2023-12-23 DIAGNOSIS — M47896 Other spondylosis, lumbar region: Secondary | ICD-10-CM

## 2024-01-05 ENCOUNTER — Encounter (HOSPITAL_COMMUNITY): Payer: Federal, State, Local not specified - PPO

## 2024-01-05 ENCOUNTER — Ambulatory Visit (HOSPITAL_BASED_OUTPATIENT_CLINIC_OR_DEPARTMENT_OTHER)
Admission: RE | Admit: 2024-01-05 | Discharge: 2024-01-05 | Disposition: A | Discharge status: Home / Self Care | Payer: Federal, State, Local not specified - PPO | Source: Ambulatory Visit | Attending: Internal Medicine | Admitting: Internal Medicine

## 2024-01-05 DIAGNOSIS — M47896 Other spondylosis, lumbar region: Secondary | ICD-10-CM | POA: Insufficient documentation

## 2024-01-23 ENCOUNTER — Ambulatory Visit (HOSPITAL_COMMUNITY)
Admission: RE | Admit: 2024-01-23 | Discharge: 2024-01-23 | Disposition: A | Discharge status: Home / Self Care | Payer: Federal, State, Local not specified - PPO | Source: Ambulatory Visit | Attending: Internal Medicine | Admitting: Internal Medicine

## 2024-01-23 ENCOUNTER — Inpatient Hospital Stay: Payer: Federal, State, Local not specified - PPO | Attending: Internal Medicine

## 2024-01-23 DIAGNOSIS — R0602 Shortness of breath: Secondary | ICD-10-CM | POA: Insufficient documentation

## 2024-01-23 DIAGNOSIS — M51369 Other intervertebral disc degeneration, lumbar region without mention of lumbar back pain or lower extremity pain: Secondary | ICD-10-CM | POA: Diagnosis not present

## 2024-01-23 DIAGNOSIS — J90 Pleural effusion, not elsewhere classified: Secondary | ICD-10-CM | POA: Insufficient documentation

## 2024-01-23 DIAGNOSIS — C3490 Malignant neoplasm of unspecified part of unspecified bronchus or lung: Secondary | ICD-10-CM | POA: Insufficient documentation

## 2024-01-23 DIAGNOSIS — C3411 Malignant neoplasm of upper lobe, right bronchus or lung: Secondary | ICD-10-CM | POA: Diagnosis present

## 2024-01-23 DIAGNOSIS — N200 Calculus of kidney: Secondary | ICD-10-CM | POA: Insufficient documentation

## 2024-01-23 LAB — CBC WITH DIFFERENTIAL (CANCER CENTER ONLY)
Abs Immature Granulocytes: 0.04 10*3/uL (ref 0.00–0.07)
Basophils Absolute: 0.1 10*3/uL (ref 0.0–0.1)
Basophils Relative: 1 %
Eosinophils Absolute: 0.3 10*3/uL (ref 0.0–0.5)
Eosinophils Relative: 3 %
HCT: 39.3 % (ref 39.0–52.0)
Hemoglobin: 11.8 g/dL — ABNORMAL LOW (ref 13.0–17.0)
Immature Granulocytes: 0 %
Lymphocytes Relative: 16 %
Lymphs Abs: 1.5 10*3/uL (ref 0.7–4.0)
MCH: 25.7 pg — ABNORMAL LOW (ref 26.0–34.0)
MCHC: 30 g/dL (ref 30.0–36.0)
MCV: 85.6 fL (ref 80.0–100.0)
Monocytes Absolute: 0.9 10*3/uL (ref 0.1–1.0)
Monocytes Relative: 9 %
Neutro Abs: 6.7 10*3/uL (ref 1.7–7.7)
Neutrophils Relative %: 71 %
Platelet Count: 262 10*3/uL (ref 150–400)
RBC: 4.59 MIL/uL (ref 4.22–5.81)
RDW: 17.2 % — ABNORMAL HIGH (ref 11.5–15.5)
WBC Count: 9.5 10*3/uL (ref 4.0–10.5)
nRBC: 0 % (ref 0.0–0.2)

## 2024-01-23 LAB — CMP (CANCER CENTER ONLY)
ALT: 15 U/L (ref 0–44)
AST: 15 U/L (ref 15–41)
Albumin: 4.3 g/dL (ref 3.5–5.0)
Alkaline Phosphatase: 58 U/L (ref 38–126)
Anion gap: 4 — ABNORMAL LOW (ref 5–15)
BUN: 17 mg/dL (ref 8–23)
CO2: 30 mmol/L (ref 22–32)
Calcium: 9.3 mg/dL (ref 8.9–10.3)
Chloride: 109 mmol/L (ref 98–111)
Creatinine: 1.27 mg/dL — ABNORMAL HIGH (ref 0.61–1.24)
GFR, Estimated: 55 mL/min — ABNORMAL LOW (ref >60)
Glucose, Bld: 108 mg/dL — ABNORMAL HIGH (ref 70–99)
Potassium: 5.1 mmol/L (ref 3.5–5.1)
Sodium: 143 mmol/L (ref 135–145)
Total Bilirubin: 0.9 mg/dL (ref 0.0–1.2)
Total Protein: 6.7 g/dL (ref 6.5–8.1)

## 2024-01-23 MED ORDER — IOHEXOL 300 MG/ML  SOLN
75.0000 mL | Freq: Once | INTRAMUSCULAR | Status: AC | PRN
  Administered 2024-01-23: 75 mL via INTRAVENOUS

## 2024-01-25 ENCOUNTER — Inpatient Hospital Stay: Payer: Federal, State, Local not specified - PPO | Admitting: Internal Medicine

## 2024-01-25 VITALS — BP 132/73 | HR 97 | Temp 98.4°F | Resp 16 | Ht 70.0 in | Wt 189.5 lb

## 2024-01-25 DIAGNOSIS — C3411 Malignant neoplasm of upper lobe, right bronchus or lung: Secondary | ICD-10-CM | POA: Diagnosis not present

## 2024-01-25 NOTE — Progress Notes (Signed)
 [facility] [phone]   [phone]  OFFICE PROGRESS NOTE  Rodrigo Ran, MD [address] [place] [address]  DIAGNOSIS AND STAGE: Stage III A (T2a, N2, M0) non-small cell lung cancer, adenocarcinoma with positive EGFR mutation in exon 19 and negative ALK gene translocation diagnosed in April 2014   PRIOR THERAPY:  1) Status post right upper lobectomy with lymph node dissection under the care of Dr. Tyrone Sage on 02/21/2013. 2) Systemic adjuvant chemotherapy with carboplatin for AUC of 5 and Alimta 500 mg/M2 every 3 weeks. Status post 4 cycles. Last cycle was given on 05/29/2013. (Carboplatin was used in place of cisplatin secondary to concern about intolerability and significant adverse effects of cisplatin).  3) Adjuvant radiotherapy to the mediastinum under the care of Dr. Mitzi Hansen completed 08/27/2013.   CURRENT THERAPY: Observation  INTERVAL HISTORY: Alvin Hardy 89 y.o. male returns to the clinic today for annual follow-up visit.Discussed the use of AI scribe software for clinical note transcription with the patient, who gave verbal consent to proceed.  History of Present Illness   Alvin Hardy is an 89 year old male with lung cancer who presents for evaluation with repeat CT scan of the chest for restaging of his disease.  He is undergoing follow-up evaluation with a repeat CT scan of the chest to restage his lung cancer. His last treatment was on May 29, 2013, and he has been under observation since. The recent CT scan shows a stable ground glass nodule in the superior segment of the right lower lobe and a slight increase in right-sided pleural effusion. Additionally, a small kidney stone was noted on the right side.  Over the past year, he has experienced back pain, for which he underwent surgery. He has a history of degenerative disc disease, confirmed by a recent MRI showing severe changes. He experiences shortness of breath,  which he attributes to age, especially after physical exertion such as walking to a class at [facility]. He feels exhausted but regains energy after resting.  In 2022, he had a severe episode of pneumonia, requiring intubation and ventilation for three days, followed by two weeks in the hospital and three weeks in rehabilitation. He describes this as bacterial pneumonia and recalls being very weak during recovery.  Earlier this year, he experienced significant weight loss, losing about 6 to 8 pounds, but has since returned to his normal weight of 183 pounds. He resides in an independent living facility with his wife, Erskine Squibb, and he has been there for five years. He reports making friends and adjusting to the community.  No current cough, hemoptysis, or chest pain. He experiences shortness of breath with exertion but not at rest.        MEDICAL HISTORY: Past Medical History:  Diagnosis Date   Allergy    Arthritis    psoriatic arthritis, ? , treated /w mmethotrexate    Bipolar 1 disorder (HCC)    BPH (benign prostatic hyperplasia)    Cataract 11/01/2008   Bilateral   Chest pain    Depression    Deviated septum    TO THE LEFT   ED (erectile dysfunction)    Essential hypertension 06/28/2021   Finger injury    mallet finger-- 02/13/2013, cast in place, followed by dr. Merlyn Lot   GERD (gastroesophageal reflux disease)    Gilbert's syndrome    History of kidney stones    History of radiation therapy 07/24/13-08/27/13   50Gy/25 fx chest  Hyperlipidemia    Hypogonadism male    Laryngopharyngeal reflux    lung ca dx'd 12/2012   rul   Lung mass 02/07/2013   RIGHT UPPER LOBE   Morton's neuroma    LEFT FOOT   Neuromuscular disorder (HCC)    peripheral neuropathy   Pneumonia    Shortness of breath    voice breaks, ? related to reflux, although told by Dr. Christella Hartigan, S.- early 37 yr. old    ALLERGIES:  is allergic to codeine, hydrocodone-acetaminophen, omeprazole magnesium, tramadol, bacitracin,  coenzyme q10, decadron [dexamethasone], fenofibrate, hydrocodone, neomycin-bacitracin zn-polymyx, other, penicillin g, polymyxin b, propoxyphene, statins, ubidecarenone, zetia [ezetimibe], and zoloft [sertraline hcl].  MEDICATIONS:  Current Outpatient Medications  Medication Sig Dispense Refill   aspirin EC 81 MG tablet Take 81 mg by mouth every morning.      benzonatate (TESSALON) 100 MG capsule Take 1 capsule (100 mg total) by mouth every 8 (eight) hours. 21 capsule 0   Cholecalciferol (VITAMIN D-3) 1000 UNITS CAPS Take 1,000 Units by mouth every morning.      clonazePAM (KLONOPIN) 0.5 MG tablet Take 1 tablet (0.5 mg total) by mouth at bedtime. 30 tablet 0   cyanocobalamin 1000 MCG tablet Take 1,000 mcg by mouth every morning.      cyclobenzaprine (FLEXERIL) 5 MG tablet Take 1 tablet (5 mg total) by mouth 3 (three) times daily as needed for muscle spasms. 30 tablet 0   docusate sodium (COLACE) 100 MG capsule Take 1 capsule (100 mg total) by mouth 2 (two) times daily. 30 capsule 0   esomeprazole (NEXIUM) 40 MG capsule Take 40 mg by mouth daily before breakfast.     finasteride (PROSCAR) 5 MG tablet Take 5 mg by mouth daily before breakfast.      furosemide (LASIX) 20 MG tablet TAKE 1 TABLET(20 MG) BY MOUTH DAILY (Patient taking differently: Take 20-40 mg by mouth See admin instructions. Take 20 mg by mouth every other day, alternating with 40 mg on alternate days) 90 tablet 0   gabapentin (NEURONTIN) 300 MG capsule Take 600-900 mg by mouth See admin instructions. Take 600 mg by mouth every morning and 900 mg at bedtime.     galantamine (RAZADYNE ER) 8 MG 24 hr capsule Take 8 mg by mouth daily.     HYDROmorphone (DILAUDID) 2 MG tablet Take 1-2 tablets (2-4 mg total) by mouth every 4 (four) hours as needed for severe pain or moderate pain. (Patient not taking: Reported on 09/20/2023) 30 tablet 0   lidocaine (LIDODERM) 5 % Place 1 patch onto the skin daily. Remove & Discard patch within 12 hours or  as directed by MD 30 patch 0   lithium carbonate (LITHOBID) 300 MG CR tablet Take 300 mg by mouth at bedtime.     potassium chloride (KLOR-CON) 10 MEQ tablet Take 10 mEq by mouth daily.     REPATHA SURECLICK 140 MG/ML SOAJ Inject 140 mg as directed every 14 (fourteen) days.     tamsulosin (FLOMAX) 0.4 MG CAPS capsule Take 0.4 mg by mouth daily.  3   testosterone cypionate (DEPOTESTOTERONE CYPIONATE) 200 MG/ML injection Inject 200 mg into the muscle every 28 (twenty-eight) days.  1   umeclidinium-vilanterol (ANORO ELLIPTA) 62.5-25 MCG/ACT AEPB Inhale 1 puff into the lungs daily as needed (shortness of breath).     No current facility-administered medications for this visit.    SURGICAL HISTORY:  Past Surgical History:  Procedure Laterality Date   CYSTOSCOPY N/A 02/21/2013   Procedure:  CYSTOSCOPY FLEXIBLE with insertion of foley catheter;  Surgeon: Antony Haste, MD;  Location: [facility] OR;  Service: Urology;  Laterality: N/A;   EYE SURGERY     cataracts removed fr. both eyes, IOL in place    KNEE ARTHROSCOPY Right 01/2007   LUMBAR LAMINECTOMY/DECOMPRESSION MICRODISCECTOMY Bilateral 11/17/2022   Procedure: L34, L45 LAM/FORAM;  Surgeon: Tressie Stalker, MD;  Location: [facility] OR;  Service: Neurosurgery;  Laterality: Bilateral;  3C   PILONIDAL CYST EXCISION  1960's   PROSTATE SURGERY  1996   TURP   VIDEO ASSISTED THORACOSCOPY (VATS)/WEDGE RESECTION Right 02/21/2013   Procedure: Right VIDEO ASSISTED THORACOSCOPY ,Thoracotomy with right upper lobectomy, node sampling ;  Surgeon: Delight Ovens, MD;  Location: [facility] OR;  Service: Thoracic;  Laterality: Right;   VIDEO BRONCHOSCOPY N/A 02/21/2013   Procedure: VIDEO BRONCHOSCOPY;  Surgeon: Delight Ovens, MD;  Location: [facility] OR;  Service: Thoracic;  Laterality: N/A;    REVIEW OF SYSTEMS:  A comprehensive review of systems was negative except for: Respiratory: positive for dyspnea on exertion   PHYSICAL EXAMINATION: General appearance: alert,  cooperative, and no distress Head: Normocephalic, without obvious abnormality, atraumatic Neck: no adenopathy Lymph nodes: Cervical, supraclavicular, and axillary nodes normal. Resp: clear to auscultation bilaterally Back: symmetric, no curvature. ROM normal. No CVA tenderness. Cardio: regular rate and rhythm, S1, S2 normal, no murmur, click, rub or gallop GI: soft, non-tender; bowel sounds normal; no masses,  no organomegaly Extremities: extremities normal, atraumatic, no cyanosis or edema  ECOG PERFORMANCE STATUS: 1 - Symptomatic but completely ambulatory  Blood pressure 132/73, pulse 97, temperature 98.4 F (36.9 C), temperature source Temporal, resp. rate 16, height 5\' 10"  (1.778 m), weight 189 lb 8 oz (86 kg), SpO2 100%.  LABORATORY DATA: Lab Results  Component Value Date   WBC 9.5 01/23/2024   HGB 11.8 (L) 01/23/2024   HCT 39.3 01/23/2024   MCV 85.6 01/23/2024   PLT 262 01/23/2024      Chemistry      Component Value Date/Time   NA 143 01/23/2024 1338   NA 142 01/17/2017 1125   K 5.1 01/23/2024 1338   K 4.6 01/17/2017 1125   CL 109 01/23/2024 1338   CL 107 04/24/2013 1110   CO2 30 01/23/2024 1338   CO2 26 01/17/2017 1125   BUN 17 01/23/2024 1338   BUN 13.0 01/17/2017 1125   CREATININE 1.27 (H) 01/23/2024 1338   CREATININE 1.0 01/17/2017 1125      Component Value Date/Time   CALCIUM 9.3 01/23/2024 1338   CALCIUM 9.1 01/17/2017 1125   ALKPHOS 58 01/23/2024 1338   ALKPHOS 59 01/17/2017 1125   AST 15 01/23/2024 1338   AST 15 01/17/2017 1125   ALT 15 01/23/2024 1338   ALT 18 01/17/2017 1125   BILITOT 0.9 01/23/2024 1338   BILITOT 1.57 (H) 01/17/2017 1125       RADIOGRAPHIC STUDIES: CT Chest W Contrast Result Date: 01/25/2024 CLINICAL DATA:  Non-small cell lung cancer.  * Tracking Code: BO * EXAM: CT CHEST WITH CONTRAST TECHNIQUE: Multidetector CT imaging of the chest was performed during intravenous contrast administration. RADIATION DOSE REDUCTION: This exam  was performed according to the departmental dose-optimization program which includes automated exposure control, adjustment of the mA and/or kV according to patient size and/or use of iterative reconstruction technique. CONTRAST:  75mL OMNIPAQUE IOHEXOL 300 MG/ML  SOLN COMPARISON:  01/24/2023. FINDINGS: Cardiovascular: Atherosclerotic calcification of the aorta, aortic valve and coronary arteries. Heart is at the upper limits  of normal in size to mildly enlarged. Trace pericardial effusion. Mediastinum/Nodes: No pathologically enlarged mediastinal, hilar or axillary lymph nodes. Esophagus is grossly unremarkable. Lungs/Pleura: Ground-glass nodule with surrounding architectural distortion in the superior segment right lower lobe measures 1.2 x 1.6 cm (6/34), stable. Right upper lobectomy with post radiation parenchymal retraction and bronchiectasis in the perihilar right lung. Small loculated right pleural effusion, increased. No left pleural fluid. Airway is otherwise unremarkable. Upper Abdomen: Small right renal stone. Minimally hyperdense 2.0 cm lesion off the upper pole left kidney, unchanged from 01/24/2023. No specific follow-up necessary. Visualized portions of the liver, gallbladder, adrenal glands, kidneys, spleen, pancreas, stomach and bowel are otherwise grossly unremarkable. No upper abdominal adenopathy. Musculoskeletal: Degenerative changes in the spine. No worrisome lytic or sclerotic lesions. Right-sided thoracotomies. IMPRESSION: 1. Right upper lobectomy with post radiation scarring in the medial right hemithorax. No evidence of metastatic disease. 2. Ground-glass nodule in the superior segment right lower lobe, stable. Recommend continued attention on follow-up as adenocarcinoma cannot be excluded. 3. Trace pericardial effusion. 4. Small partially loculated right pleural effusion, increased. 5. Small right renal stone. 6. Aortic atherosclerosis (ICD10-I70.0). Coronary artery calcification.  Electronically Signed   By: Leanna Battles M.D.   On: 01/25/2024 15:17   MR LUMBAR SPINE WO CONTRAST Result Date: 01/25/2024 CLINICAL DATA:  Low back pain EXAM: MRI LUMBAR SPINE WITHOUT CONTRAST TECHNIQUE: Multiplanar, multisequence MR imaging of the lumbar spine was performed. No intravenous contrast was administered. COMPARISON:  MRI of the lumbar spine dated 10/20/2022 FINDINGS: Segmentation: Standard. Alignment:  Mild dextrocurvature of the lumbar spine. Vertebrae: Prior posterior decompression at L3-L4 and L4-L5. Degenerative endplate marrow changes at several levels. Schmorl's nodes at multiple levels throughout the lumbar spine. No compression fractures. Conus medullaris and cauda equina: The conus medullaris terminates at the level of L1-L2. The distal spinal cord signal intensity is normal. Paraspinal and other soft tissues: The visualized abdomen and pelvis show no soft tissue abnormality. The visualized aorta is normal. Disc levels: L1-L2: Disc bulge. Mild bilateral facet arthropathy. Moderate bilateral neuroforaminal stenosis. Mild spinal canal stenosis. L2-L3: Disc bulge. Moderate bilateral facet arthropathy. Severe bilateral neuroforaminal stenosis. Severe spinal canal stenosis. L3-L4: Disc bulge. Moderate bilateral facet arthropathy. Severe left and moderate right neuroforaminal stenosis. No spinal canal stenosis. L4-L5: Disc bulge. Moderate bilateral facet arthropathy. Severe bilateral neuroforaminal stenosis. No spinal canal stenosis. L5-S1: Disc bulge. Severe bilateral facet arthropathy. Severe bilateral neuroforaminal stenosis. No spinal canal stenosis. IMPRESSION: 1. Prior posterior decompression at L3-L4 and L4-L5. 2. Severe canal stenosis at L2-L3 secondary to disc bulging and facet arthropathy. 3. Moderate and severe foraminal stenoses throughout the lumbar spine secondary to disc bulging and facet arthropathy as detailed above. Electronically Signed   By: Baldemar Friday M.D.   On:  01/25/2024 11:19    ASSESSMENT AND PLAN: This is a very pleasant 89 years old white male with history of stage IIIa non-small cell lung cancer, adenocarcinoma with positive EGFR mutation in exon 19 diagnosed in April 2014.  Status post right upper lobectomy with lymph node dissection followed by 4 cycles of adjuvant systemic chemotherapy followed by adjuvant radiation to the mediastinum. The patient has been in observation since that time and he is feeling fine with no concerning complaints. Assessment and Plan    Stage III A (T2a, N2, M0) non-small cell lung cancer, adenocarcinoma with positive EGFR mutation in exon 19 and negative ALK gene translocation diagnosed in April 2014. Status post right upper lobectomy with lymph node dissection under  the care of Dr. Tyrone Sage on 02/21/2013. Systemic adjuvant chemotherapy with carboplatin for AUC of 5 and Alimta 500 mg/M2 every 3 weeks. Status post 4 cycles. Last cycle was given on 05/29/2013. (Carboplatin was used in place of cisplatin secondary to concern about intolerability and significant adverse effects of cisplatin).  S/P Adjuvant radiotherapy to the mediastinum under the care of Dr. Mitzi Hansen completed 08/27/2013.  Lung cancer with a ground glass nodule in the superior segment of the right lower lobe remains unchanged. No evidence of new growth or metastasis. EGFR mutation present, offering oral medication as a treatment option if recurrence occurs. Previous chemotherapy and radiation have not led to recurrence. - Monitor ground glass nodule and symptoms such as dyspnea, hemoptysis, chest pain, and unintentional weight loss - Discuss oral medication for EGFR mutation if cancer recurs  Pleural effusion Right-sided pleural effusion has slightly increased in size and is under ongoing observation.  Shortness of breath Reports dyspnea attributed to age-related changes and exertion. Oxygen saturation is 100%, with no acute respiratory issues. - Evaluate  changes in respiratory symptoms - Consult primary care physician if symptoms persist  Kidney stone Small right-sided kidney stone present, asymptomatic but potential for pain if it becomes problematic. - Monitor for renal colic  Degenerative disc disease Degenerative disc disease confirmed by MRI, causing back pain. He has undergone surgery for this condition. - Continue follow-up with neurosurgeon for management  Weight changes Experienced a weight loss of 6-8 pounds but has returned to baseline weight. Weight loss was not linked to any specific illness. - Monitor weight and report significant changes to primary care physician  Follow-up Considering whether to continue annual visits with hematologist/oncologist or follow up with primary care physician based on condition stability and primary care physician's ability to monitor health. Comfortable with primary care physician managing routine care, will contact hematologist/oncologist if new symptoms or concerns arise. - Follow up with primary care physician for routine monitoring - Contact hematologist/oncologist if new symptoms or concerns arise     The patient voices understanding of current disease status and treatment options and is in agreement with the current care plan. All questions were answered. The patient knows to call the clinic with any problems, questions or concerns. We can certainly see the patient much sooner if necessary.   Disclaimer: This note was dictated with voice recognition software. Similar sounding words can inadvertently be transcribed and may not be corrected upon review.

## 2024-06-01 ENCOUNTER — Ambulatory Visit: Admitting: Podiatry

## 2024-06-01 ENCOUNTER — Encounter: Payer: Self-pay | Admitting: Podiatry

## 2024-06-01 DIAGNOSIS — Z899 Acquired absence of limb, unspecified: Secondary | ICD-10-CM | POA: Diagnosis not present

## 2024-06-01 DIAGNOSIS — L84 Corns and callosities: Secondary | ICD-10-CM

## 2024-06-01 DIAGNOSIS — M79674 Pain in right toe(s): Secondary | ICD-10-CM | POA: Diagnosis not present

## 2024-06-01 DIAGNOSIS — M79675 Pain in left toe(s): Secondary | ICD-10-CM | POA: Diagnosis not present

## 2024-06-01 DIAGNOSIS — B351 Tinea unguium: Secondary | ICD-10-CM | POA: Diagnosis not present

## 2024-06-01 NOTE — Progress Notes (Signed)
  Subjective:  Patient ID: Alvin Hardy, male    DOB: Oct 08, 1935,   MRN: [id number]  Chief Complaint  Patient presents with   Nail Problem    Trim these nails.   Callouses    This callus on my left foot hurts like the dickens.  I have one on the right foot too.    89 y.o. male presents for concern of thickened elongated and painful nails that are difficult to trim. Requesting to have them trimmed today. Relates burning and tingling in their feet. History of left partial fifth digit amputation and pre-ulcerative callus in that area.     PCP:  Shayne Anes, MD    . Denies any other pedal complaints. Denies n/v/f/c.   Past Medical History:  Diagnosis Date   Allergy    Arthritis    psoriatic arthritis, ? , treated /w mmethotrexate    Bipolar 1 disorder (HCC)    BPH (benign prostatic hyperplasia)    Cataract 11/01/2008   Bilateral   Chest pain    Depression    Deviated septum    TO THE LEFT   ED (erectile dysfunction)    Essential hypertension 06/28/2021   Finger injury    mallet finger-- 02/13/2013, cast in place, followed by dr. Murrell   GERD (gastroesophageal reflux disease)    Gilbert's syndrome    History of kidney stones    History of radiation therapy 07/24/13-08/27/13   50Gy/25 fx chest   Hyperlipidemia    Hypogonadism male    Laryngopharyngeal reflux    lung ca dx'd 12/2012   rul   Lung mass 02/07/2013   RIGHT UPPER LOBE   Morton's neuroma    LEFT FOOT   Neuromuscular disorder (HCC)    peripheral neuropathy   Pneumonia    Shortness of breath    voice breaks, ? related to reflux, although told by Dr. Teressa, S.- early 37 yr. old    Objective:  Physical Exam: Vascular: DP/PT pulses 2/4 bilateral. CFT <3 seconds. Absent hair growth on digits. Edema noted to bilateral lower extremities. Xerosis noted bilaterally.  Skin. No lacerations or abrasions bilateral feet. Nails 1-5 bilateral  are thickened discolored and elongated with subungual debris.  Hyperkeratotic tissue noted over fifth left metatarsal head.  Musculoskeletal: MMT 5/5 bilateral lower extremities in DF, PF, Inversion and Eversion. Deceased ROM in DF of ankle joint. Partial fifth digit amputation  Neurological: Sensation intact to light touch. Protective sensation diminished bilateral.    Assessment:   1. Pain due to onychomycosis of toenails of both feet   2. Pre-ulcerative calluses   3. History of amputation          Plan:  Patient was evaluated and treated and all questions answered. -ABN signed today.  -Discussed and educated patient on foot care, especially with  regards to the vascular, neurological and musculoskeletal systems.  -Discussed supportive shoes at all times and checking feet regularly.  -Mechanically debrided all nails 1-5 bilateral using sterile nail nipper and filed with dremel without incident  -Answered all patient questions -Hyperkeratotic tissue trimmed without incident to plantar fifth metatarsal head.  -Advised to keep padding on this area to prevent from worsening.  -Patient to return  in  3 months for at risk foot care -Patient advised to call the office if any problems or questions arise in the meantime.    Asberry Failing, DPM

## 2024-08-08 ENCOUNTER — Emergency Department (HOSPITAL_COMMUNITY)

## 2024-08-08 ENCOUNTER — Encounter (HOSPITAL_COMMUNITY): Payer: Self-pay

## 2024-08-08 ENCOUNTER — Other Ambulatory Visit: Payer: Self-pay

## 2024-08-08 ENCOUNTER — Inpatient Hospital Stay (HOSPITAL_COMMUNITY)
Admission: EM | Admit: 2024-08-08 | Discharge: 2024-08-11 | DRG: 291 | Disposition: A | Discharge status: Home / Self Care | Attending: Internal Medicine | Admitting: Internal Medicine

## 2024-08-08 DIAGNOSIS — I5043 Acute on chronic combined systolic (congestive) and diastolic (congestive) heart failure: Secondary | ICD-10-CM | POA: Diagnosis present

## 2024-08-08 DIAGNOSIS — F319 Bipolar disorder, unspecified: Secondary | ICD-10-CM | POA: Diagnosis present

## 2024-08-08 DIAGNOSIS — E785 Hyperlipidemia, unspecified: Secondary | ICD-10-CM | POA: Diagnosis present

## 2024-08-08 DIAGNOSIS — D649 Anemia, unspecified: Secondary | ICD-10-CM | POA: Diagnosis present

## 2024-08-08 DIAGNOSIS — Z87892 Personal history of anaphylaxis: Secondary | ICD-10-CM

## 2024-08-08 DIAGNOSIS — I080 Rheumatic disorders of both mitral and aortic valves: Secondary | ICD-10-CM | POA: Diagnosis present

## 2024-08-08 DIAGNOSIS — I43 Cardiomyopathy in diseases classified elsewhere: Secondary | ICD-10-CM | POA: Diagnosis present

## 2024-08-08 DIAGNOSIS — N1831 Chronic kidney disease, stage 3a: Secondary | ICD-10-CM | POA: Diagnosis present

## 2024-08-08 DIAGNOSIS — I509 Heart failure, unspecified: Principal | ICD-10-CM

## 2024-08-08 DIAGNOSIS — M79661 Pain in right lower leg: Secondary | ICD-10-CM | POA: Diagnosis not present

## 2024-08-08 DIAGNOSIS — N400 Enlarged prostate without lower urinary tract symptoms: Secondary | ICD-10-CM | POA: Diagnosis present

## 2024-08-08 DIAGNOSIS — Z902 Acquired absence of lung [part of]: Secondary | ICD-10-CM | POA: Diagnosis not present

## 2024-08-08 DIAGNOSIS — Z7982 Long term (current) use of aspirin: Secondary | ICD-10-CM | POA: Diagnosis not present

## 2024-08-08 DIAGNOSIS — I2489 Other forms of acute ischemic heart disease: Secondary | ICD-10-CM | POA: Diagnosis present

## 2024-08-08 DIAGNOSIS — I5031 Acute diastolic (congestive) heart failure: Secondary | ICD-10-CM | POA: Diagnosis present

## 2024-08-08 DIAGNOSIS — J449 Chronic obstructive pulmonary disease, unspecified: Secondary | ICD-10-CM | POA: Diagnosis present

## 2024-08-08 DIAGNOSIS — Z87891 Personal history of nicotine dependence: Secondary | ICD-10-CM | POA: Diagnosis not present

## 2024-08-08 DIAGNOSIS — Z880 Allergy status to penicillin: Secondary | ICD-10-CM

## 2024-08-08 DIAGNOSIS — I2510 Atherosclerotic heart disease of native coronary artery without angina pectoris: Secondary | ICD-10-CM | POA: Diagnosis present

## 2024-08-08 DIAGNOSIS — J9811 Atelectasis: Secondary | ICD-10-CM | POA: Diagnosis present

## 2024-08-08 DIAGNOSIS — I10 Essential (primary) hypertension: Secondary | ICD-10-CM | POA: Diagnosis present

## 2024-08-08 DIAGNOSIS — Z85118 Personal history of other malignant neoplasm of bronchus and lung: Secondary | ICD-10-CM

## 2024-08-08 DIAGNOSIS — Z9221 Personal history of antineoplastic chemotherapy: Secondary | ICD-10-CM

## 2024-08-08 DIAGNOSIS — Z885 Allergy status to narcotic agent status: Secondary | ICD-10-CM

## 2024-08-08 DIAGNOSIS — Z801 Family history of malignant neoplasm of trachea, bronchus and lung: Secondary | ICD-10-CM

## 2024-08-08 DIAGNOSIS — Z66 Do not resuscitate: Secondary | ICD-10-CM | POA: Diagnosis present

## 2024-08-08 DIAGNOSIS — C3410 Malignant neoplasm of upper lobe, unspecified bronchus or lung: Secondary | ICD-10-CM | POA: Diagnosis present

## 2024-08-08 DIAGNOSIS — Z8249 Family history of ischemic heart disease and other diseases of the circulatory system: Secondary | ICD-10-CM

## 2024-08-08 DIAGNOSIS — Z79899 Other long term (current) drug therapy: Secondary | ICD-10-CM | POA: Diagnosis not present

## 2024-08-08 DIAGNOSIS — Z888 Allergy status to other drugs, medicaments and biological substances status: Secondary | ICD-10-CM

## 2024-08-08 DIAGNOSIS — I130 Hypertensive heart and chronic kidney disease with heart failure and stage 1 through stage 4 chronic kidney disease, or unspecified chronic kidney disease: Secondary | ICD-10-CM | POA: Diagnosis present

## 2024-08-08 DIAGNOSIS — E854 Organ-limited amyloidosis: Secondary | ICD-10-CM | POA: Diagnosis present

## 2024-08-08 DIAGNOSIS — R600 Localized edema: Secondary | ICD-10-CM | POA: Diagnosis present

## 2024-08-08 DIAGNOSIS — I872 Venous insufficiency (chronic) (peripheral): Secondary | ICD-10-CM | POA: Diagnosis present

## 2024-08-08 DIAGNOSIS — Z923 Personal history of irradiation: Secondary | ICD-10-CM | POA: Diagnosis not present

## 2024-08-08 DIAGNOSIS — K219 Gastro-esophageal reflux disease without esophagitis: Secondary | ICD-10-CM | POA: Diagnosis present

## 2024-08-08 DIAGNOSIS — I5033 Acute on chronic diastolic (congestive) heart failure: Secondary | ICD-10-CM | POA: Diagnosis not present

## 2024-08-08 DIAGNOSIS — Z8701 Personal history of pneumonia (recurrent): Secondary | ICD-10-CM

## 2024-08-08 DIAGNOSIS — I878 Other specified disorders of veins: Secondary | ICD-10-CM | POA: Diagnosis present

## 2024-08-08 DIAGNOSIS — G629 Polyneuropathy, unspecified: Secondary | ICD-10-CM | POA: Diagnosis present

## 2024-08-08 DIAGNOSIS — Z87442 Personal history of urinary calculi: Secondary | ICD-10-CM

## 2024-08-08 DIAGNOSIS — Z833 Family history of diabetes mellitus: Secondary | ICD-10-CM

## 2024-08-08 LAB — I-STAT CHEM 8, ED
BUN: 17 mg/dL (ref 8–23)
Calcium, Ion: 1.19 mmol/L (ref 1.15–1.40)
Chloride: 109 mmol/L (ref 98–111)
Creatinine, Ser: 1.2 mg/dL (ref 0.61–1.24)
Glucose, Bld: 100 mg/dL — ABNORMAL HIGH (ref 70–99)
HCT: 32 % — ABNORMAL LOW (ref 39.0–52.0)
Hemoglobin: 10.9 g/dL — ABNORMAL LOW (ref 13.0–17.0)
Potassium: 4.2 mmol/L (ref 3.5–5.1)
Sodium: 141 mmol/L (ref 135–145)
TCO2: 22 mmol/L (ref 22–32)

## 2024-08-08 LAB — CBC WITH DIFFERENTIAL/PLATELET
Abs Immature Granulocytes: 0.07 K/uL (ref 0.00–0.07)
Basophils Absolute: 0.1 K/uL (ref 0.0–0.1)
Basophils Relative: 1 %
Eosinophils Absolute: 0.2 K/uL (ref 0.0–0.5)
Eosinophils Relative: 2 %
HCT: 38.5 % — ABNORMAL LOW (ref 39.0–52.0)
Hemoglobin: 11.1 g/dL — ABNORMAL LOW (ref 13.0–17.0)
Immature Granulocytes: 1 %
Lymphocytes Relative: 13 %
Lymphs Abs: 1.4 K/uL (ref 0.7–4.0)
MCH: 23.6 pg — ABNORMAL LOW (ref 26.0–34.0)
MCHC: 28.8 g/dL — ABNORMAL LOW (ref 30.0–36.0)
MCV: 81.9 fL (ref 80.0–100.0)
Monocytes Absolute: 0.8 K/uL (ref 0.1–1.0)
Monocytes Relative: 8 %
Neutro Abs: 7.9 K/uL — ABNORMAL HIGH (ref 1.7–7.7)
Neutrophils Relative %: 75 %
Platelets: 233 K/uL (ref 150–400)
RBC: 4.7 MIL/uL (ref 4.22–5.81)
RDW: 16.2 % — ABNORMAL HIGH (ref 11.5–15.5)
WBC: 10.5 K/uL (ref 4.0–10.5)
nRBC: 0 % (ref 0.0–0.2)

## 2024-08-08 LAB — TROPONIN T, HIGH SENSITIVITY
Troponin T High Sensitivity: 67 ng/L — ABNORMAL HIGH (ref 0–19)
Troponin T High Sensitivity: 60 ng/L — ABNORMAL HIGH (ref 0–19)

## 2024-08-08 LAB — COMPREHENSIVE METABOLIC PANEL WITH GFR
ALT: 33 U/L (ref 0–44)
AST: 25 U/L (ref 15–41)
Albumin: 4.2 g/dL (ref 3.5–5.0)
Alkaline Phosphatase: 77 U/L (ref 38–126)
Anion gap: 13 (ref 5–15)
BUN: 20 mg/dL (ref 8–23)
CO2: 21 mmol/L — ABNORMAL LOW (ref 22–32)
Calcium: 9.4 mg/dL (ref 8.9–10.3)
Chloride: 106 mmol/L (ref 98–111)
Creatinine, Ser: 1.16 mg/dL (ref 0.61–1.24)
GFR, Estimated: >60 mL/min (ref >60)
Glucose, Bld: 116 mg/dL — ABNORMAL HIGH (ref 70–99)
Potassium: 4.2 mmol/L (ref 3.5–5.1)
Sodium: 139 mmol/L (ref 135–145)
Total Bilirubin: 0.9 mg/dL (ref 0.0–1.2)
Total Protein: 6.5 g/dL (ref 6.5–8.1)

## 2024-08-08 LAB — PRO BRAIN NATRIURETIC PEPTIDE: Pro Brain Natriuretic Peptide: 1466 pg/mL — ABNORMAL HIGH (ref <300.0)

## 2024-08-08 LAB — LITHIUM LEVEL: Lithium Lvl: 0.64 mmol/L (ref 0.60–1.20)

## 2024-08-08 LAB — D-DIMER, QUANTITATIVE: D-Dimer, Quant: 1.03 ug{FEU}/mL — ABNORMAL HIGH (ref 0.00–0.50)

## 2024-08-08 MED ORDER — CARBOXYMETHYLCELLULOSE SODIUM 1 % OP SOLN: 1.0000 [drp] | Freq: Three times a day (TID) | OPHTHALMIC | Status: DC | PRN

## 2024-08-08 MED ORDER — FUROSEMIDE 10 MG/ML IJ SOLN
40.0000 mg | Freq: Every day | INTRAMUSCULAR | Status: DC
  Filled 2024-08-08: qty 4

## 2024-08-08 MED ORDER — IBUPROFEN 200 MG PO TABS
400.0000 mg | ORAL_TABLET | Freq: Four times a day (QID) | ORAL | Status: DC | PRN
  Administered 2024-08-09: 400 mg via ORAL
  Filled 2024-08-08: qty 2

## 2024-08-08 MED ORDER — UMECLIDINIUM-VILANTEROL 62.5-25 MCG/ACT IN AEPB
1.0000 | INHALATION_SPRAY | Freq: Every day | RESPIRATORY_TRACT | Status: DC
  Administered 2024-08-09: 1 via RESPIRATORY_TRACT
  Filled 2024-08-08: qty 14

## 2024-08-08 MED ORDER — LORAZEPAM 1 MG PO TABS
1.0000 mg | ORAL_TABLET | Freq: Four times a day (QID) | ORAL | Status: DC | PRN
  Administered 2024-08-08 – 2024-08-11 (×4): 1 mg via ORAL
  Filled 2024-08-08 (×4): qty 1

## 2024-08-08 MED ORDER — GABAPENTIN 300 MG PO CAPS
600.0000 mg | ORAL_CAPSULE | Freq: Every day | ORAL | Status: DC
  Administered 2024-08-09 – 2024-08-11 (×3): 600 mg via ORAL
  Filled 2024-08-08 (×3): qty 2

## 2024-08-08 MED ORDER — FUROSEMIDE 10 MG/ML IJ SOLN: 20.0000 mg | Freq: Once | INTRAMUSCULAR | Status: DC

## 2024-08-08 MED ORDER — CLONAZEPAM 0.5 MG PO TABS
0.5000 mg | ORAL_TABLET | Freq: Every day | ORAL | Status: DC
  Administered 2024-08-08 – 2024-08-10 (×3): 0.5 mg via ORAL
  Filled 2024-08-08 (×3): qty 1

## 2024-08-08 MED ORDER — TRAZODONE HCL 50 MG PO TABS
25.0000 mg | ORAL_TABLET | Freq: Every evening | ORAL | Status: DC | PRN
  Administered 2024-08-08 – 2024-08-10 (×3): 25 mg via ORAL
  Filled 2024-08-08 (×3): qty 1

## 2024-08-08 MED ORDER — IOHEXOL 350 MG/ML SOLN
75.0000 mL | Freq: Once | INTRAVENOUS | Status: AC | PRN
  Administered 2024-08-08: 75 mL via INTRAVENOUS

## 2024-08-08 MED ORDER — TAMSULOSIN HCL 0.4 MG PO CAPS
0.4000 mg | ORAL_CAPSULE | Freq: Every day | ORAL | Status: DC
  Administered 2024-08-09 – 2024-08-11 (×3): 0.4 mg via ORAL
  Filled 2024-08-08 (×3): qty 1

## 2024-08-08 MED ORDER — ASPIRIN 81 MG PO TBEC
81.0000 mg | DELAYED_RELEASE_TABLET | Freq: Every morning | ORAL | Status: DC
  Administered 2024-08-09 – 2024-08-11 (×3): 81 mg via ORAL
  Filled 2024-08-08 (×3): qty 1

## 2024-08-08 MED ORDER — GABAPENTIN 300 MG PO CAPS
900.0000 mg | ORAL_CAPSULE | Freq: Every day | ORAL | Status: DC
  Administered 2024-08-08 – 2024-08-10 (×3): 900 mg via ORAL
  Filled 2024-08-08 (×3): qty 3

## 2024-08-08 MED ORDER — FINASTERIDE 5 MG PO TABS
5.0000 mg | ORAL_TABLET | Freq: Every day | ORAL | Status: DC
  Administered 2024-08-09 – 2024-08-11 (×3): 5 mg via ORAL
  Filled 2024-08-08 (×3): qty 1

## 2024-08-08 MED ORDER — ENOXAPARIN SODIUM 40 MG/0.4ML IJ SOSY
40.0000 mg | PREFILLED_SYRINGE | INTRAMUSCULAR | Status: DC
Start: 2024-08-08 — End: 2024-08-11
  Administered 2024-08-08 – 2024-08-10 (×3): 40 mg via SUBCUTANEOUS
  Filled 2024-08-08 (×3): qty 0.4

## 2024-08-08 MED ORDER — POLYVINYL ALCOHOL 1.4 % OP SOLN: 1.0000 [drp] | Freq: Three times a day (TID) | OPHTHALMIC | Status: DC | PRN

## 2024-08-08 MED ORDER — FUROSEMIDE 10 MG/ML IJ SOLN
40.0000 mg | Freq: Once | INTRAMUSCULAR | Status: AC
  Administered 2024-08-08: 40 mg via INTRAVENOUS
  Filled 2024-08-08: qty 4

## 2024-08-08 MED ORDER — GABAPENTIN 300 MG PO CAPS
600.0000 mg | ORAL_CAPSULE | ORAL | Status: DC
Start: 2024-08-08 — End: 2024-08-08

## 2024-08-08 MED ORDER — POTASSIUM CHLORIDE CRYS ER 10 MEQ PO TBCR
20.0000 meq | EXTENDED_RELEASE_TABLET | Freq: Every day | ORAL | Status: DC
  Administered 2024-08-09: 20 meq via ORAL
  Filled 2024-08-08: qty 2

## 2024-08-08 MED ORDER — GALANTAMINE HYDROBROMIDE ER 8 MG PO CP24
8.0000 mg | ORAL_CAPSULE | Freq: Every day | ORAL | Status: DC
Start: 2024-08-09 — End: 2024-08-11
  Administered 2024-08-09 – 2024-08-11 (×3): 8 mg via ORAL
  Filled 2024-08-08 (×3): qty 1

## 2024-08-08 MED ORDER — PANTOPRAZOLE SODIUM 40 MG PO TBEC
40.0000 mg | DELAYED_RELEASE_TABLET | Freq: Every day | ORAL | Status: DC
  Administered 2024-08-09 – 2024-08-11 (×3): 40 mg via ORAL
  Filled 2024-08-08 (×3): qty 1

## 2024-08-08 NOTE — H&P (Signed)
 History and Physical  Alvin Hardy FMW:[id number] DOB: 1935-07-23 DOA: 08/08/2024  PCP: Shayne Anes, MD   Chief Complaint: Leg swelling, shortness of breath  HPI: Alvin Hardy is a 89 y.o. male with medical history significant for essential hypertension, treated lung cancer, bipolar 1 disorder, heart failure with preserved EF on Lasix  being admitted to the hospital with several days of lower extremity edema, dyspnea on exertion and orthopnea and concern for acute on chronic congestive heart failure.  Patient states that he takes Lasix  daily but is unable to tell me the dose.  He dose about 4 days ago.  He was starting to develop bilateral ankle edema right greater than left.  He also started to develop some dyspnea on exertion, and orthopnea.  No cough, no chest pain, no fevers, chills.  He noticed about 3 days ago that his weight was up about 10 pounds.  However he did not call his cardiologist, did not change his medications.  Last night he got increasingly short of breath to the point that he was having trouble sleeping when lying flat so decided to come to the ER for evaluation.  Review of Systems: Please see HPI for pertinent positives and negatives. A complete 10 system review of systems are otherwise negative.  Past Medical History:  Diagnosis Date   Allergy    Arthritis    psoriatic arthritis, ? , treated /w mmethotrexate    Bipolar 1 disorder (HCC)    BPH (benign prostatic hyperplasia)    Cataract 11/01/2008   Bilateral   Chest pain    Depression    Deviated septum    TO THE LEFT   ED (erectile dysfunction)    Essential hypertension 06/28/2021   Finger injury    mallet finger-- 02/13/2013, cast in place, followed by dr. Murrell   GERD (gastroesophageal reflux disease)    Gilbert's syndrome    History of kidney stones    History of radiation therapy 07/24/13-08/27/13   50Gy/25 fx chest   Hyperlipidemia    Hypogonadism male    Laryngopharyngeal reflux    lung ca dx'd  12/2012   rul   Lung mass 02/07/2013   RIGHT UPPER LOBE   Morton's neuroma    LEFT FOOT   Neuromuscular disorder (HCC)    peripheral neuropathy   Pneumonia    Shortness of breath    voice breaks, ? related to reflux, although told by Dr. Teressa, S.- early 37 yr. old   Past Surgical History:  Procedure Laterality Date   CYSTOSCOPY N/A 02/21/2013   Procedure: CYSTOSCOPY FLEXIBLE with insertion of foley catheter;  Surgeon: Donnice Gwenyth Brooks, MD;  Location: [facility] OR;  Service: Urology;  Laterality: N/A;   EYE SURGERY     cataracts removed fr. both eyes, IOL in place    KNEE ARTHROSCOPY Right 01/2007   LUMBAR LAMINECTOMY/DECOMPRESSION MICRODISCECTOMY Bilateral 11/17/2022   Procedure: L34, L45 LAM/FORAM;  Surgeon: Mavis Purchase, MD;  Location: [facility] OR;  Service: Neurosurgery;  Laterality: Bilateral;  3C   PILONIDAL CYST EXCISION  1960's   PROSTATE SURGERY  1996   TURP   VIDEO ASSISTED THORACOSCOPY (VATS)/WEDGE RESECTION Right 02/21/2013   Procedure: Right VIDEO ASSISTED THORACOSCOPY ,Thoracotomy with right upper lobectomy, node sampling ;  Surgeon: [place] KATHEE Jude, MD;  Location: [facility] OR;  Service: Thoracic;  Laterality: Right;   VIDEO BRONCHOSCOPY N/A 02/21/2013   Procedure: VIDEO BRONCHOSCOPY;  Surgeon: Dallas KATHEE Jude, MD;  Location: [facility] OR;  Service: Thoracic;  Laterality: N/A;  Social History:  reports that he quit smoking about 45 years ago. His smoking use included cigarettes and pipe. He started smoking about 69 years ago. He has a 12 pack-year smoking history. He has never used smokeless tobacco. He reports current alcohol  use. He reports that he does not use drugs.  Allergies  Allergen Reactions   Codeine Anaphylaxis   Hydrocodone-Acetaminophen  Other (See Comments)    Reaction unknown     Omeprazole Magnesium  Other (See Comments)    Reaction Unknown   Tramadol     Reaction Unknown   Atorvastatin Other (See Comments)    Leg pain   Bacitracin     Bacitracin -Polymyxin B     Coenzyme Q10 Other (See Comments)    Tightness in legs   Dexamethasone      Pt says it is contraindication due to the lithium  he is taking  Other Reaction(s): Not available, Unknown  dexamethasone    Ezetimibe     Muscle aches  Other Reaction(s): Not available  ezetimibe   Fenofibrate Other (See Comments)    Muscle spasms   Hydrocodone     Other Reaction(s): Not available, Unknown   Neomycin-Bacitracin  Zn-Polymyx Other (See Comments)    Reactions Unknown   Other     Pt. Remarks that all pain med. Make him nauseated    Penicillin G     Reactions Unknown  Other Reaction(s): Not available, Unknown  penicillin G   Penicillins     Other Reaction(s): Unknown   Polymyxin B     Other Reaction(s): Not available   Pravastatin Other (See Comments)    Other Reaction(s): aching in legs  pravastatin   Propoxyphene Other (See Comments)    Other reaction(s): Other (See Comments)  Reaction Unknown  Other Reaction(s): Not available  propoxyphene   Rosuvastatin  Other (See Comments)    Other Reaction(s): bad leg pain  rosuvastatin    Sertraline Other (See Comments)    Other Reaction(s): mood changes, Not available  sertraline   Statins     Lipitor, Crestor , Zocor all caused muscle aches  Other Reaction(s): Not available   Ubidecarenone Other (See Comments)    Other reaction(s): Other (See Comments)  Reactions Unknown  Other Reaction(s): Not available  ubidecarenone   Zoloft [Sertraline Hcl] Other (See Comments)    REACTION:  unknown    Family History  Problem Relation Age of Onset   Diabetes Mother    Cancer Mother        abdomen   Heart disease Father    Diabetes Son    Cancer Maternal Aunt        lung ca, smoker     Prior to Admission medications   Medication Sig Start Date End Date Taking? Authorizing Provider  aspirin  EC 81 MG tablet Take 81 mg by mouth every morning.     [provider]  benzonatate  (TESSALON ) 100 MG capsule Take 1 capsule  (100 mg total) by mouth every 8 (eight) hours. 10/06/23   Theotis Peers M, PA-C  Cholecalciferol  (VITAMIN D -3) 1000 UNITS CAPS Take 1,000 Units by mouth every morning.     [provider]  clonazePAM  (KLONOPIN ) 0.5 MG tablet Take 1 tablet (0.5 mg total) by mouth at bedtime. 07/08/21   Rojelio Nest, DO  cyanocobalamin  1000 MCG tablet Take 1,000 mcg by mouth every morning.     [provider]  cyclobenzaprine  (FLEXERIL ) 5 MG tablet Take 1 tablet (5 mg total) by mouth 3 (three) times daily as needed for muscle spasms. 11/18/22  Mavis Purchase, MD  docusate sodium  (COLACE) 100 MG capsule Take 1 capsule (100 mg total) by mouth 2 (two) times daily. 11/18/22   Mavis Purchase, MD  esomeprazole (NEXIUM) 40 MG capsule Take 40 mg by mouth daily before breakfast.    [provider]  finasteride  (PROSCAR ) 5 MG tablet Take 5 mg by mouth daily before breakfast.     [provider]  furosemide  (LASIX ) 20 MG tablet TAKE 1 TABLET(20 MG) BY MOUTH DAILY Patient taking differently: Take 20-40 mg by mouth See admin instructions. Take 20 mg by mouth every other day, alternating with 40 mg on alternate days 08/30/21   Gladis Leonor HERO, MD  gabapentin  (NEURONTIN ) 300 MG capsule Take 600-900 mg by mouth See admin instructions. Take 600 mg by mouth every morning and 900 mg at bedtime.    [provider]  galantamine (RAZADYNE ER) 8 MG 24 hr capsule Take 8 mg by mouth daily.    [provider]  HYDROmorphone  (DILAUDID ) 2 MG tablet Take 1-2 tablets (2-4 mg total) by mouth every 4 (four) hours as needed for severe pain or moderate pain. Patient not taking: Reported on 09/20/2023 11/18/22   Mavis Purchase, MD  lidocaine  (LIDODERM ) 5 % Place 1 patch onto the skin daily. Remove & Discard patch within 12 hours or as directed by MD Patient not taking: Reported on 08/08/2024 10/06/23   Theotis Cameron HERO, PA-C  lithium  carbonate (LITHOBID ) 300 MG CR tablet Take 300 mg by mouth  at bedtime.    [provider]  potassium chloride  (KLOR-CON ) 10 MEQ tablet Take 10 mEq by mouth daily. 09/07/21   [provider]  REPATHA SURECLICK 140 MG/ML SOAJ Inject 140 mg as directed every 14 (fourteen) days. 05/28/16   [provider]  tamsulosin  (FLOMAX ) 0.4 MG CAPS capsule Take 0.4 mg by mouth daily. 12/19/14   [provider]  testosterone  cypionate (DEPOTESTOTERONE CYPIONATE) 200 MG/ML injection Inject 200 mg into the muscle every 28 (twenty-eight) days. 11/05/14   [provider]  umeclidinium-vilanterol (ANORO ELLIPTA ) 62.5-25 MCG/ACT AEPB Inhale 1 puff into the lungs daily as needed (shortness of breath).    [provider]    Physical Exam: BP 125/77   Pulse 84   Temp 98.5 F (36.9 C) (Oral)   Resp (!) 28   Ht 5' 10 (1.778 m)   Wt 86.6 kg   SpO2 96%   BMI 27.41 kg/m  General:  Alert, oriented, calm, in no acute distress, resting comfortably on room air, generally the patient looks moderately volume overloaded Cardiovascular: RRR, no murmurs or rubs, 2+ pitting edema up to the ankles Respiratory: clear to auscultation bilaterally, no wheezes, no crackles  Abdomen: soft, nontender, nondistended, normal bowel tones heard  Skin: dry, no rashes  Musculoskeletal: no joint effusions, normal range of motion  Psychiatric: appropriate affect, normal speech  Neurologic: extraocular muscles intact, clear speech, moving all extremities with intact sensorium         Labs on Admission:  Basic Metabolic Panel: Recent Labs  Lab 08/08/24 0942 08/08/24 1208  NA 139 141  K 4.2 4.2  CL 106 109  CO2 21*  --   GLUCOSE 116* 100*  BUN 20 17  CREATININE 1.16 1.20  CALCIUM  9.4  --    Liver Function Tests: Recent Labs  Lab 08/08/24 0942  AST 25  ALT 33  ALKPHOS 77  BILITOT 0.9  PROT 6.5  ALBUMIN 4.2   No results for input(s): LIPASE, AMYLASE  in the last 168 hours. No results for input(s): AMMONIA in the last 168  hours. CBC: Recent Labs  Lab 08/08/24 0942 08/08/24 1208  WBC 10.5  --   NEUTROABS 7.9*  --   HGB 11.1* 10.9*  HCT 38.5* 32.0*  MCV 81.9  --   PLT 233  --    Cardiac Enzymes: No results for input(s): CKTOTAL, CKMB, CKMBINDEX, TROPONINI in the last 168 hours. BNP (last 3 results) No results for input(s): BNP in the last 8760 hours.  ProBNP (last 3 results) Recent Labs    08/08/24 0942  PROBNP 1,466.0*    CBG: No results for input(s): GLUCAP in the last 168 hours.  Radiological Exams on Admission: CT Angio Chest PE W and/or Wo Contrast Result Date: 08/08/2024 CLINICAL DATA:  Positive D-dimer.  Evaluate for pulmonary embolus. EXAM: CT ANGIOGRAPHY CHEST WITH CONTRAST TECHNIQUE: Multidetector CT imaging of the chest was performed using the standard protocol during bolus administration of intravenous contrast. Multiplanar CT image reconstructions and MIPs were obtained to evaluate the vascular anatomy. RADIATION DOSE REDUCTION: This exam was performed according to the departmental dose-optimization program which includes automated exposure control, adjustment of the mA and/or kV according to patient size and/or use of iterative reconstruction technique. CONTRAST:  75mL OMNIPAQUE  IOHEXOL  350 MG/ML SOLN COMPARISON:  01/23/2024. FINDINGS: Cardiovascular: Assessment of the segmental and subsegmental pulmonary arteries is challenging due to poor contrast opacification. Otherwise, no central or lobar pulmonary embolus. Atherosclerotic calcification of the aorta and coronary arteries. Heart is enlarged. No pericardial effusion. Mediastinum/Nodes: No pathologically enlarged mediastinal, hilar or axillary lymph nodes. Esophagus is grossly unremarkable. Lungs/Pleura: Image quality is degraded by expiratory phase imaging and respiratory motion. Right upper lobectomy. Vague 12 mm ground-glass nodule in the superior segment right lower lobe (6/48), stable. Post radiation pulmonary retraction  in the perihilar right lung. Small right pleural effusion and trace left pleural effusion. Mild basilar septal thickening. Airway is otherwise unremarkable. Upper Abdomen: 2.0 cm hyperdense lesion in the left kidney, 39 Hounsfield units, previously 2.4 cm and fluid in density on 10/12/2021. Findings suggest a slightly involuted cyst with proteinaceous or hemorrhagic debris. No specific follow-up necessary. Gastric wall thickening. Visualized portions of the liver, gallbladder, adrenal glands, kidneys, spleen, pancreas, stomach and bowel are otherwise grossly unremarkable. No upper abdominal adenopathy. Musculoskeletal: Degenerative changes in the spine. Review of the MIP images confirms the above findings. IMPRESSION: 1. Negative for central or lobar pulmonary embolus. Segmental and subsegmental pulmonary arteries cannot be assessed due to poor opacification and respiratory motion. 2. Mild congestive heart failure. 3. 12 mm ground-glass nodule in the superior segment right lower lobe, stable. Recommend continued attention on follow-up. 4. Gastric wall thickening. 5. Aortic atherosclerosis (ICD10-I70.0). Coronary artery calcification. Electronically Signed   By: Newell Eke M.D.   On: 08/08/2024 13:32   DG Chest 2 View Result Date: 08/08/2024 EXAM: 2 VIEW(S) XRAY OF THE CHEST 08/08/2024 09:59:00 AM COMPARISON: 10/06/2023 CLINICAL HISTORY: SHOB, leg swelling. FINDINGS: LUNGS AND PLEURA: Mildly increased bandlike density at the right lung base favoring atelectasis. Right lung postsurgical changes with volume loss. HEART AND MEDIASTINUM: Mild enlargement of the cardiopericardial silhouette. BONES AND SOFT TISSUES: Thoracic spondylosis. Note: The frontal projection is rotated to the right, reducing diagnostic sensitivity and specificity. IMPRESSION: 1. Mildly increased bandlike density at the right lung base, favoring atelectasis. 2. Right lung postsurgical changes with volume loss. 3. Mild enlargement of the  cardiopericardial silhouette. 4. Thoracic spondylosis. Electronically signed by: Ryan Salvage MD 08/08/2024 10:28  AM EDT RP Workstation: [id number]   Assessment/Plan JAMMY PLOTKIN is a 89 y.o. male with medical history significant for essential hypertension, treated lung cancer, bipolar 1 disorder, heart failure with preserved EF on Lasix  being admitted to the hospital with several days of lower extremity edema, dyspnea on exertion and orthopnea and concern for acute on chronic congestive heart failure.  Acute on chronic heart failure with preserved EF-suspected due to dyspnea, orthopnea, ankle edema, moderately elevated BNP and report of significant weight gain.  Last documented echo January 2023 with EF 50%, indeterminate diastolic dysfunction. -Inpatient admission -Monitor on telemetry -Heart healthy diet with 1800 cc fluid restriction -IV Lasix  40 mg daily with potassium replacement -2D echo -Nonurgent inpatient cardiology consultation requested  Lower extremity edema-reported to be worse on the right, however on my examination it appears to be equal bilaterally.  Suspect this is due to congestive heart failure, patient also wears compression stockings. -Check D-dimer to rule out VTE -Treat heart failure as above  Abnormal troponin-minimal elevation, without acute EKG changes, likely due to CHF will trend  BPH-Proscar   Normocytic anemia-stable  Neuropathy-gabapentin   Bipolar 1-lithium  nightly  DVT prophylaxis: Lovenox      Code Status: Full Code  Consults called: Cardiology  Admission status: The appropriate patient status for this patient is INPATIENT. Inpatient status is judged to be reasonable and necessary in order to provide the required intensity of service to ensure the patient's safety. The patient's presenting symptoms, physical exam findings, and initial radiographic and laboratory data in the context of their chronic comorbidities is felt to place them at high  risk for further clinical deterioration. Furthermore, it is not anticipated that the patient will be medically stable for discharge from the hospital within 2 midnights of admission.    I certify that at the point of admission it is my clinical judgment that the patient will require inpatient hospital care spanning beyond 2 midnights from the point of admission due to high intensity of service, high risk for further deterioration and high frequency of surveillance required  Time spent: 53 minutes  Donis Pinder CHRISTELLA Gail MD [facility] Pager [phone]  If 7PM-7AM, please contact night-coverage [email or web address] Password [email or web address]  08/08/2024, 2:13 PM

## 2024-08-08 NOTE — ED Triage Notes (Signed)
 Pt reports with [facility], right leg/ankle swelling, and a gain of 10 lbs since Monday.

## 2024-08-08 NOTE — ED Notes (Addendum)
 Pt reports feeling anxious, like he wants to jump out of his skin  MD notified.  Giving PO med

## 2024-08-08 NOTE — ED Provider Notes (Signed)
 [place] [facility] DEPARTMENT AT [facility] Provider Note   CSN: [id number] Arrival date & time: 08/08/24  9063     Patient presents with: Shortness of Breath   Alvin Hardy is a 89 y.o. male patient with past medical history of hypertension, hyperlipidemia, bipolar disorder, GERD is presenting to emergency room with complaint of right sided leg swelling, weight gain and shortness of breath.  Has dyspnea with exertion and orthopnea.  Has had some cough but not productive, no fever, no chest pain. No recent travel, no recent surgery, no DVT/PE history. No CHF history.     Shortness of Breath      Prior to Admission medications   Medication Sig Start Date End Date Taking? Authorizing Provider  aspirin  EC 81 MG tablet Take 81 mg by mouth every morning.     [provider]  benzonatate  (TESSALON ) 100 MG capsule Take 1 capsule (100 mg total) by mouth every 8 (eight) hours. 10/06/23   Theotis Peers M, PA-C  Cholecalciferol  (VITAMIN D -3) 1000 UNITS CAPS Take 1,000 Units by mouth every morning.     [provider]  clonazePAM  (KLONOPIN ) 0.5 MG tablet Take 1 tablet (0.5 mg total) by mouth at bedtime. 07/08/21   Rojelio Nest, DO  cyanocobalamin  1000 MCG tablet Take 1,000 mcg by mouth every morning.     [provider]  cyclobenzaprine  (FLEXERIL ) 5 MG tablet Take 1 tablet (5 mg total) by mouth 3 (three) times daily as needed for muscle spasms. 11/18/22   Mavis Purchase, MD  docusate sodium  (COLACE) 100 MG capsule Take 1 capsule (100 mg total) by mouth 2 (two) times daily. 11/18/22   Mavis Purchase, MD  esomeprazole (NEXIUM) 40 MG capsule Take 40 mg by mouth daily before breakfast.    [provider]  finasteride  (PROSCAR ) 5 MG tablet Take 5 mg by mouth daily before breakfast.     [provider]  furosemide  (LASIX ) 20 MG tablet TAKE 1 TABLET(20 MG) BY MOUTH DAILY Patient taking differently: Take 20-40 mg by mouth See admin  instructions. Take 20 mg by mouth every other day, alternating with 40 mg on alternate days 08/30/21   Gladis Leonor HERO, MD  gabapentin  (NEURONTIN ) 300 MG capsule Take 600-900 mg by mouth See admin instructions. Take 600 mg by mouth every morning and 900 mg at bedtime.    [provider]  galantamine (RAZADYNE ER) 8 MG 24 hr capsule Take 8 mg by mouth daily.    [provider]  HYDROmorphone  (DILAUDID ) 2 MG tablet Take 1-2 tablets (2-4 mg total) by mouth every 4 (four) hours as needed for severe pain or moderate pain. Patient not taking: Reported on 09/20/2023 11/18/22   Mavis Purchase, MD  lidocaine  (LIDODERM ) 5 % Place 1 patch onto the skin daily. Remove & Discard patch within 12 hours or as directed by MD 10/06/23   Theotis Peers HERO, PA-C  lithium  carbonate (LITHOBID ) 300 MG CR tablet Take 300 mg by mouth at bedtime.    [provider]  potassium chloride  (KLOR-CON ) 10 MEQ tablet Take 10 mEq by mouth daily. 09/07/21   [provider]  REPATHA SURECLICK 140 MG/ML SOAJ Inject 140 mg as directed every 14 (fourteen) days. 05/28/16   [provider]  tamsulosin  (FLOMAX ) 0.4 MG CAPS capsule Take 0.4 mg by mouth daily. 12/19/14   [provider]  testosterone  cypionate (DEPOTESTOTERONE CYPIONATE) 200 MG/ML injection Inject 200 mg into the muscle every 28 (twenty-eight) days. 11/05/14  [provider]  umeclidinium-vilanterol (ANORO ELLIPTA ) 62.5-25 MCG/ACT AEPB Inhale 1 puff into the lungs daily as needed (shortness of breath).    [provider]    Allergies: Codeine, Hydrocodone-acetaminophen , Omeprazole magnesium , Tramadol, Bacitracin , Coenzyme q10, Decadron  [dexamethasone ], Fenofibrate, Hydrocodone, Neomycin-bacitracin  zn-polymyx, Other, Penicillin g, Polymyxin b, Propoxyphene, Statins, Ubidecarenone, Zetia [ezetimibe], and Zoloft [sertraline hcl]    Review of Systems  Respiratory:  Positive for shortness of breath.      Updated Vital Signs BP 125/77   Pulse 84   Temp 98.5 F (36.9 C) (Oral)   Resp (!) 28   Ht 5' 10 (1.778 m)   Wt 86.6 kg   SpO2 96%   BMI 27.41 kg/m   Physical Exam Vitals and nursing note reviewed.  Constitutional:      General: He is not in acute distress.    Appearance: He is not toxic-appearing.  HENT:     Head: Normocephalic and atraumatic.  Eyes:     General: No scleral icterus.    Conjunctiva/sclera: Conjunctivae normal.  Cardiovascular:     Rate and Rhythm: Normal rate and regular rhythm.     Pulses: Normal pulses.     Heart sounds: Normal heart sounds.  Pulmonary:     Effort: Pulmonary effort is normal. No respiratory distress.     Breath sounds: Rales present.  Abdominal:     General: Abdomen is flat. Bowel sounds are normal.     Palpations: Abdomen is soft.     Tenderness: There is no abdominal tenderness.  Musculoskeletal:     Right lower leg: Edema present.     Left lower leg: Edema present.  Skin:    General: Skin is warm and dry.     Findings: No lesion.  Neurological:     General: No focal deficit present.     Mental Status: He is alert and oriented to person, place, and time. Mental status is at baseline.     (all labs ordered are listed, but only abnormal results are displayed) Labs Reviewed  CBC WITH DIFFERENTIAL/PLATELET - Abnormal; Notable for the following components:      Result Value   Hemoglobin 11.1 (*)    HCT 38.5 (*)    MCH 23.6 (*)    MCHC 28.8 (*)    RDW 16.2 (*)    Neutro Abs 7.9 (*)    All other components within normal limits  COMPREHENSIVE METABOLIC PANEL WITH GFR - Abnormal; Notable for the following components:   CO2 21 (*)    Glucose, Bld 116 (*)    All other components within normal limits  PRO BRAIN NATRIURETIC PEPTIDE - Abnormal; Notable for the following components:   Pro Brain Natriuretic Peptide 1,466.0 (*)    All other components within normal limits  I-STAT CHEM 8, ED - Abnormal; Notable for the  following components:   Glucose, Bld 100 (*)    Hemoglobin 10.9 (*)    HCT 32.0 (*)    All other components within normal limits  LITHIUM  LEVEL  TROPONIN T, HIGH SENSITIVITY    EKG: EKG Interpretation Date/Time:  Wednesday August 08 2024 09:43:02 EDT Ventricular Rate:  93 PR Interval:  228 QRS Duration:  142 QT Interval:  397 QTC Calculation: 494 R Axis:   240  Text Interpretation: Sinus rhythm Prolonged PR interval Probable left atrial enlargement Right bundle branch block Inferior infarct, old Baseline wander in lead(s) V2 No significant change since last tracing Confirmed by Emil Share [phone]) on 08/08/2024 12:18:25  PM  Radiology: CT Angio Chest PE W and/or Wo Contrast Result Date: 08/08/2024 CLINICAL DATA:  Positive D-dimer.  Evaluate for pulmonary embolus. EXAM: CT ANGIOGRAPHY CHEST WITH CONTRAST TECHNIQUE: Multidetector CT imaging of the chest was performed using the standard protocol during bolus administration of intravenous contrast. Multiplanar CT image reconstructions and MIPs were obtained to evaluate the vascular anatomy. RADIATION DOSE REDUCTION: This exam was performed according to the departmental dose-optimization program which includes automated exposure control, adjustment of the mA and/or kV according to patient size and/or use of iterative reconstruction technique. CONTRAST:  75mL OMNIPAQUE  IOHEXOL  350 MG/ML SOLN COMPARISON:  01/23/2024. FINDINGS: Cardiovascular: Assessment of the segmental and subsegmental pulmonary arteries is challenging due to poor contrast opacification. Otherwise, no central or lobar pulmonary embolus. Atherosclerotic calcification of the aorta and coronary arteries. Heart is enlarged. No pericardial effusion. Mediastinum/Nodes: No pathologically enlarged mediastinal, hilar or axillary lymph nodes. Esophagus is grossly unremarkable. Lungs/Pleura: Image quality is degraded by expiratory phase imaging and respiratory motion. Right upper lobectomy.  Vague 12 mm ground-glass nodule in the superior segment right lower lobe (6/48), stable. Post radiation pulmonary retraction in the perihilar right lung. Small right pleural effusion and trace left pleural effusion. Mild basilar septal thickening. Airway is otherwise unremarkable. Upper Abdomen: 2.0 cm hyperdense lesion in the left kidney, 39 Hounsfield units, previously 2.4 cm and fluid in density on 10/12/2021. Findings suggest a slightly involuted cyst with proteinaceous or hemorrhagic debris. No specific follow-up necessary. Gastric wall thickening. Visualized portions of the liver, gallbladder, adrenal glands, kidneys, spleen, pancreas, stomach and bowel are otherwise grossly unremarkable. No upper abdominal adenopathy. Musculoskeletal: Degenerative changes in the spine. Review of the MIP images confirms the above findings. IMPRESSION: 1. Negative for central or lobar pulmonary embolus. Segmental and subsegmental pulmonary arteries cannot be assessed due to poor opacification and respiratory motion. 2. Mild congestive heart failure. 3. 12 mm ground-glass nodule in the superior segment right lower lobe, stable. Recommend continued attention on follow-up. 4. Gastric wall thickening. 5. Aortic atherosclerosis (ICD10-I70.0). Coronary artery calcification. Electronically Signed   By: Newell Eke M.D.   On: 08/08/2024 13:32   DG Chest 2 View Result Date: 08/08/2024 EXAM: 2 VIEW(S) XRAY OF THE CHEST 08/08/2024 09:59:00 AM COMPARISON: 10/06/2023 CLINICAL HISTORY: SHOB, leg swelling. FINDINGS: LUNGS AND PLEURA: Mildly increased bandlike density at the right lung base favoring atelectasis. Right lung postsurgical changes with volume loss. HEART AND MEDIASTINUM: Mild enlargement of the cardiopericardial silhouette. BONES AND SOFT TISSUES: Thoracic spondylosis. Note: The frontal projection is rotated to the right, reducing diagnostic sensitivity and specificity. IMPRESSION: 1. Mildly increased bandlike density at  the right lung base, favoring atelectasis. 2. Right lung postsurgical changes with volume loss. 3. Mild enlargement of the cardiopericardial silhouette. 4. Thoracic spondylosis. Electronically signed by: Ryan Salvage MD 08/08/2024 10:28 AM EDT RP Workstation: [id number]     Procedures   Medications Ordered in the ED  furosemide  (LASIX ) injection 40 mg (has no administration in time range)  enoxaparin  (LOVENOX ) injection 40 mg (has no administration in time range)  traZODone (DESYREL) tablet 25 mg (has no administration in time range)  ibuprofen (ADVIL) tablet 400 mg (has no administration in time range)  iohexol  (OMNIPAQUE ) 350 MG/ML injection 75 mL (75 mLs Intravenous Contrast Given 08/08/24 1246)                                    Medical Decision Making Amount  and/or Complexity of Data Reviewed Labs: ordered. Radiology: ordered.  Risk Prescription drug management. Decision regarding hospitalization.   This patient presents to the ED for concern of shortness of breath, this involves an extensive number of treatment options, and is a complaint that carries with it a high risk of complications and morbidity.  The differential diagnosis includes CHF, PE, pneumonia, pneumothorax, pulmonary embolus, asthma, COPD   Co morbidities that complicate the patient evaluation  Bipolar disorder Hypogonadism in male Hypertension, hyperlipidemia Obesity Lung cancer   Additional history obtained:  Additional history obtained from reviewed oncology visit for his history of his lung cancer.  Not currently receiving treatment for this last treatment was 2014.   Lab Tests:  I personally interpreted labs.  The pertinent results include:   No leukocytosis, hemoglobin is 11 CMP is overall unremarkable with no electrolyte abnormality proBNP is elevated at 1500   Imaging Studies ordered:  I ordered imaging studies including chest x-ray, PE study. I independently visualized and  interpreted imaging which showed right lung base atelectasis, right lung postop changes, mild enlarged cardio silhouette. CT Angio chest PE: Difficult to assess due to respiratory motion however no obvious acute PE.  Does show some mild congestive heart failure. I agree with the radiologist interpretation   Cardiac Monitoring: / EKG:  The patient was maintained on a cardiac monitor.  I personally viewed and interpreted the cardiac monitored which showed an underlying rhythm of: Sinus, right bundle branch block, no significant change since prior EKG.   Consultations Obtained:  I requested consultation with the [facility] team for admission,  and discussed lab and imaging findings as well as pertinent plan.   Problem List / ED Course / Critical interventions / Medication management  Presents with shortness of breath specifically dyspnea on exertion and orthopnea.  This is accompanied by swelling of lower extremities.  Currently experiencing left lower extremities pitting edema.  Although this has been ongoing for some time and has worsened over the last 3 days.  On arrival patient is slightly tachypneic but in no acute distress and not hypoxic.  Lab work shows indeterminant but elevated BNP.  Chest x-ray does not show any congestion.  I am concerned about DVT and PE thus obtaining Doppler ultrasounds and CT PE study.  His EKG shows no ischemic changes and troponin shows mild elevation at 67 Given his symptoms will give Lasix  and reassess. I ordered medication including Lasix .  Reevaluation of the patient after these medicines showed that the patient stayed the same I have reviewed the patients home medicines and have made adjustments as needed. Patient will be admitted.       Final diagnoses:  Congestive heart failure, unspecified HF chronicity, unspecified heart failure type [facility])    ED Discharge Orders     None          Shermon Warren SAILOR, PA-C 08/08/24 1404    Emil Share,  DO 08/08/24 1518

## 2024-08-09 ENCOUNTER — Inpatient Hospital Stay (HOSPITAL_COMMUNITY)

## 2024-08-09 DIAGNOSIS — E785 Hyperlipidemia, unspecified: Secondary | ICD-10-CM

## 2024-08-09 DIAGNOSIS — I5031 Acute diastolic (congestive) heart failure: Secondary | ICD-10-CM

## 2024-08-09 DIAGNOSIS — I2510 Atherosclerotic heart disease of native coronary artery without angina pectoris: Secondary | ICD-10-CM

## 2024-08-09 DIAGNOSIS — I5033 Acute on chronic diastolic (congestive) heart failure: Secondary | ICD-10-CM | POA: Diagnosis not present

## 2024-08-09 LAB — ECHOCARDIOGRAM COMPLETE
Area-P 1/2: 3.77 cm2
Est EF: 50
Height: 70 in
S' Lateral: 3.1 cm
Single Plane A4C EF: 54.3 %
Weight: 3056 [oz_av]

## 2024-08-09 LAB — BASIC METABOLIC PANEL WITH GFR
Anion gap: 8 (ref 5–15)
BUN: 17 mg/dL (ref 8–23)
CO2: 26 mmol/L (ref 22–32)
Calcium: 8.6 mg/dL — ABNORMAL LOW (ref 8.9–10.3)
Chloride: 109 mmol/L (ref 98–111)
Creatinine, Ser: 1.01 mg/dL (ref 0.61–1.24)
GFR, Estimated: >60 mL/min (ref >60)
Glucose, Bld: 112 mg/dL — ABNORMAL HIGH (ref 70–99)
Potassium: 4 mmol/L (ref 3.5–5.1)
Sodium: 143 mmol/L (ref 135–145)

## 2024-08-09 LAB — PRO BRAIN NATRIURETIC PEPTIDE: Pro Brain Natriuretic Peptide: 1292 pg/mL — ABNORMAL HIGH (ref <300.0)

## 2024-08-09 LAB — CBC
HCT: 33.1 % — ABNORMAL LOW (ref 39.0–52.0)
Hemoglobin: 9.3 g/dL — ABNORMAL LOW (ref 13.0–17.0)
MCH: 23.8 pg — ABNORMAL LOW (ref 26.0–34.0)
MCHC: 28.1 g/dL — ABNORMAL LOW (ref 30.0–36.0)
MCV: 84.7 fL (ref 80.0–100.0)
Platelets: 177 K/uL (ref 150–400)
RBC: 3.91 MIL/uL — ABNORMAL LOW (ref 4.22–5.81)
RDW: 16.4 % — ABNORMAL HIGH (ref 11.5–15.5)
WBC: 9.5 K/uL (ref 4.0–10.5)
nRBC: 0 % (ref 0.0–0.2)

## 2024-08-09 MED ORDER — LITHIUM CARBONATE ER 300 MG PO TBCR
300.0000 mg | EXTENDED_RELEASE_TABLET | Freq: Once | ORAL | Status: AC
  Administered 2024-08-09: 300 mg via ORAL
  Filled 2024-08-09: qty 1

## 2024-08-09 MED ORDER — LORAZEPAM 2 MG/ML IJ SOLN
1.0000 mg | Freq: Once | INTRAMUSCULAR | Status: AC
  Administered 2024-08-09: 1 mg via INTRAVENOUS
  Filled 2024-08-09: qty 1

## 2024-08-09 MED ORDER — DAPAGLIFLOZIN PROPANEDIOL 10 MG PO TABS
10.0000 mg | ORAL_TABLET | Freq: Every day | ORAL | Status: DC
  Administered 2024-08-09 – 2024-08-11 (×3): 10 mg via ORAL
  Filled 2024-08-09 (×3): qty 1

## 2024-08-09 MED ORDER — SPIRONOLACTONE 12.5 MG HALF TABLET
12.5000 mg | ORAL_TABLET | Freq: Every day | ORAL | Status: DC
  Administered 2024-08-09 – 2024-08-11 (×3): 12.5 mg via ORAL
  Filled 2024-08-09 (×3): qty 1

## 2024-08-09 MED ORDER — POTASSIUM CHLORIDE CRYS ER 20 MEQ PO TBCR
20.0000 meq | EXTENDED_RELEASE_TABLET | Freq: Every day | ORAL | Status: DC
  Administered 2024-08-10 – 2024-08-11 (×2): 20 meq via ORAL
  Filled 2024-08-09: qty 2
  Filled 2024-08-09: qty 1

## 2024-08-09 MED ORDER — FUROSEMIDE 10 MG/ML IJ SOLN
40.0000 mg | Freq: Two times a day (BID) | INTRAMUSCULAR | Status: DC
  Administered 2024-08-09 – 2024-08-11 (×5): 40 mg via INTRAVENOUS
  Filled 2024-08-09 (×4): qty 4

## 2024-08-09 NOTE — ED Notes (Signed)
 US  at bedside.

## 2024-08-09 NOTE — Consult Note (Addendum)
 Cardiology Consultation   Patient ID: Alvin Hardy MRN: [id number]; DOB: 10/27/1935  Admit date: 08/08/2024 Date of Consult: 08/09/2024  PCP:  Shayne Anes, MD   [facility] Providers Cardiologist:  Dorn Lesches, MD    Patient Profile: Alvin Hardy is a 89 y.o. male with a hx of heart failure with preserved EF, mild nonobstructive CAD, hypertension, treated lung cancer, bipolar 1 disorder, BPH, GERD who is being seen 08/09/2024 for the evaluation of CHF at the request of Dr. Barbarann.  History of Present Illness: Alvin Hardy is an 89 year old male with above medical history who has been followed by Dr. Lesches.   He has a history of lung cancer and previously had a VATS procedure of the right upper lobe, chemotherapy, radiation therapy in 2014.  He has been in observation since that time and continues to regularly follow with oncology.   He had coronary CTA in 2016 that showed a coronary calcium  score of 3.4 (13th percentile).  Mild, nonobstructive plaque noted.  Initially managed with statin therapy but he did not tolerate it and was later started on Repatha.  In 06/2021, he was admitted with pneumonia.  Had an echocardiogram that showed EF 45-50%, grade 2 diastolic dysfunction, mildly reduced RV systolic function.  He had some lower extremity swelling and has been managed with Lasix .  His most recent echocardiogram from 11/2021 showed EF 50%, low normal RV systolic function, mild MR, aortic valve sclerosis without evidence of stenosis.  Last seen by Dr. Lesches in 09/2023.  At that time, patient overall doing well.  Cholesterol was well-controlled.  BP was well-controlled without use of antihypertensive medications.  Lower extremity swelling overall well-controlled with compression socks and furosemide .  Presented to the ED 10/8 with shortness of breath, lower extremity swelling, weight gain of 10 pounds that have been ongoing for about 1 week.  Labs significant for creatinine  1.16, hemoglobin 11.1, WBC 10.5.  proBNP 1466.  Troponin 67> 60.  Chest x-ray showed mildly increased bandlike density at the right lung base favoring atelectasis, right lung postsurgical changes with volume loss, mild enlargement of cardiopericardial silhouette.  CTA chest showed no evidence of PE, mild congestive heart failure.  There was a 12 mm ground glass nodule in the superior segment right lower lobe which was stable.  Already on IV Lasix .  Cardiology consulted.  On interview, patient tells me that he has been having issues with lower extremity swelling for about 2-3 years.  About 3 years ago he was admitted with pneumonia.  After completing treatment for this he started to have lower extremity swelling.  He has been taking Lasix  for this.  On Monday 10/6, he noticed that he was having shortness of breath.  When he first noticed shortness of breath, it was more significant with exertion.  If he sat/lay down to rest, his breathing would improve.  Yesterday morning, he was laying flat in bed when he felt he needed to sit upright and sit on the bed to breathe comfortably.  Since then, he noted issues with shortness of breath when laying flat.  Prior to coming to the ED, he felt short of breath nearly constantly.  He estimates having about 10 pounds weight gain in the past week.  Has had worsening lower extremity swelling.  He denies chest pain, palpitations.  Denies dizziness, syncope, near syncope.  Since coming to the ED he has been started on IV Lasix .  Reports having good urine output with this, but his  breathing has not yet improved.  Continues to be on supplemental oxygen.  He feels short of breath when just sitting in the bed.   Past Medical History:  Diagnosis Date   Allergy    Arthritis    psoriatic arthritis, ? , treated /w mmethotrexate    Bipolar 1 disorder (HCC)    BPH (benign prostatic hyperplasia)    Cataract 11/01/2008   Bilateral   Chest pain    Depression    Deviated septum     TO THE LEFT   ED (erectile dysfunction)    Essential hypertension 06/28/2021   Finger injury    mallet finger-- 02/13/2013, cast in place, followed by dr. Murrell   GERD (gastroesophageal reflux disease)    Gilbert's syndrome    History of kidney stones    History of radiation therapy 07/24/13-08/27/13   50Gy/25 fx chest   Hyperlipidemia    Hypogonadism male    Laryngopharyngeal reflux    lung ca dx'd 12/2012   rul   Lung mass 02/07/2013   RIGHT UPPER LOBE   Morton's neuroma    LEFT FOOT   Neuromuscular disorder (HCC)    peripheral neuropathy   Pneumonia    Shortness of breath    voice breaks, ? related to reflux, although told by Dr. Teressa, S.- early 89 yr. old    Past Surgical History:  Procedure Laterality Date   CYSTOSCOPY N/A 02/21/2013   Procedure: CYSTOSCOPY FLEXIBLE with insertion of foley catheter;  Surgeon: Donnice Gwenyth Brooks, MD;  Location: [facility] OR;  Service: Urology;  Laterality: N/A;   EYE SURGERY     cataracts removed fr. both eyes, IOL in place    KNEE ARTHROSCOPY Right 01/2007   LUMBAR LAMINECTOMY/DECOMPRESSION MICRODISCECTOMY Bilateral 11/17/2022   Procedure: L34, L45 LAM/FORAM;  Surgeon: Mavis Purchase, MD;  Location: [facility] OR;  Service: Neurosurgery;  Laterality: Bilateral;  3C   PILONIDAL CYST EXCISION  1960's   PROSTATE SURGERY  1996   TURP   VIDEO ASSISTED THORACOSCOPY (VATS)/WEDGE RESECTION Right 02/21/2013   Procedure: Right VIDEO ASSISTED THORACOSCOPY ,Thoracotomy with right upper lobectomy, node sampling ;  Surgeon: Dallas KATHEE Jude, MD;  Location: [facility] OR;  Service: Thoracic;  Laterality: Right;   VIDEO BRONCHOSCOPY N/A 02/21/2013   Procedure: VIDEO BRONCHOSCOPY;  Surgeon: Dallas KATHEE Jude, MD;  Location: [facility] OR;  Service: Thoracic;  Laterality: N/A;      Scheduled Meds:  aspirin  EC  81 mg Oral q morning   clonazePAM   0.5 mg Oral QHS   enoxaparin  (LOVENOX ) injection  40 mg Subcutaneous Q24H   finasteride   5 mg Oral QAC breakfast   furosemide   40 mg  Intravenous BID   gabapentin   600 mg Oral Daily   And   gabapentin   900 mg Oral QHS   galantamine  8 mg Oral Q breakfast   pantoprazole   40 mg Oral Daily   potassium chloride   20 mEq Oral Daily   tamsulosin   0.4 mg Oral QPC breakfast   umeclidinium-vilanterol  1 puff Inhalation Daily   Continuous Infusions:  PRN Meds: artificial tears, ibuprofen, LORazepam , traZODone  Allergies:    Allergies  Allergen Reactions   Codeine Anaphylaxis   Hydrocodone-Acetaminophen  Nausea Only        Atorvastatin Other (See Comments)    Leg pain   Coenzyme Q10 Other (See Comments)    Tightness in legs   Dexamethasone  Other (See Comments)    Pt says it is contraindication due to the lithium  he is  taking   Ezetimibe Other (See Comments)    Muscle aches   Fenofibrate Other (See Comments)    Muscle spasms   Hydrocodone Nausea Only   Other Nausea Only and Other (See Comments)    Patient remarked that all pain medications make him nauseated.   Penicillins Other (See Comments)    Ineffective    Pravastatin Other (See Comments)    Aching in legs   Propoxyphene Nausea Only   Rosuvastatin  Other (See Comments)    Bad pains in the legs   Statins Other (See Comments)    Lipitor, Crestor , Zocor all caused muscle aches   Zoloft [Sertraline Hcl] Other (See Comments)    Reacts with lithium     Social History:   Social History   Socioeconomic History   Marital status: Married    Spouse name: Not on file   Number of children: Not on file   Years of education: Not on file   Highest education level: Not on file  Occupational History   Not on file  Tobacco Use   Smoking status: Former    Current packs/day: 0.00    Average packs/day: 0.5 packs/day for 24.0 years (12.0 ttl pk-yrs)    Types: Cigarettes, Pipe    Start date: 11/01/1954    Quit date: 11/01/1978    Years since quitting: 45.8   Smokeless tobacco: Never  Vaping Use   Vaping status: Never Used  Substance and Sexual Activity   Alcohol   use: Yes    Comment: occas.   Drug use: No   Sexual activity: Not on file  Other Topics Concern   Not on file  Social History Narrative   Not on file   Social Drivers of Health   Financial Resource Strain: Not on file  Food Insecurity: Not on file  Transportation Needs: Not on file  Physical Activity: Not on file  Stress: Not on file  Social Connections: Not on file  Intimate Partner Violence: Not on file    Family History:    Family History  Problem Relation Age of Onset   Diabetes Mother    Cancer Mother        abdomen   Heart disease Father    Diabetes Son    Cancer Maternal Aunt        lung ca, smoker     ROS:  Please see the history of present illness.   All other ROS reviewed and negative.     Physical Exam/Data: Vitals:   08/09/24 0500 08/09/24 0530 08/09/24 0600 08/09/24 0736  BP: 127/73 130/71 125/75 (!) 142/87  Pulse: 85 83 92 93  Resp:    (!) 26  Temp:    (!) 97.5 F (36.4 C)  TempSrc:    Oral  SpO2: 96% 97% 93% 98%  Weight:      Height:       No intake or output data in the 24 hours ending 08/09/24 0922    08/08/2024   10:24 AM 01/25/2024    3:35 PM 09/20/2023   11:28 AM  Last 3 Weights  Weight (lbs) 191 lb 189 lb 8 oz 186 lb  Weight (kg) 86.637 kg 85.957 kg 84.369 kg     Body mass index is 27.41 kg/m.  General:  Well nourished, well developed, in no acute distress.  Anxious appearing.  Laying in the bed with head elevated HEENT: normal Neck: no JVD Vascular: Radial pulses 2+ bilaterally Cardiac:  normal S1, S2; RRR; no murmur  Lungs:  c crackles in bilateral lung bases, more significant in right> left Abd: soft, nontender  Ext: 1+ edema in bilateral lower extremities Musculoskeletal:  No deformities  Skin: warm and dry  Neuro:  CNs 2-12 intact, no focal abnormalities noted Psych:  Normal affect   EKG:  The EKG was personally reviewed and demonstrates:  Sinus rhythm, RBBB, baseline wander in V2.  Heart rate 93 bpm Telemetry:   Telemetry was personally reviewed and demonstrates: Normal sinus rhythm, heart rate in the 80s-90s  Relevant CV Studies: Cardiac Studies & Procedures   ______________________________________________________________________________________________   STRESS TESTS  MYOCARDIAL PERFUSION IMAGING 02/20/2015  Narrative Table formatting from the original result was not included. [facility] [address] [place] [address] [place] [address] [phone]  Cardiology Nuclear Med Study  JEREMIE GIANGRANDE is a 89 y.o. male     MRN : [id number]     DOB: 1935-04-18  Procedure Date: 02/20/2015  Nuclear Med Background Indication for Stress Test:  Evaluation for Ischemia History:  No prior cardiac or respiratory history reported; Last NUC MPI on 02/08/2014 Cardiac Risk Factors: Family History - CAD, History of Smoking, Lipids and Overweight Symptoms:  Chest Pain, DOE and Fatigue   Nuclear Pre-Procedure Caffeine/Decaff Intake:  1:30am NPO After: 9:30am IV Site: R Forearm  IV 0.9% NS with Angio Cath:  22g Chest Size (in):  44 IV Started by: Alan Simpers, RN Height: 5' 11 (1.803 m)  Cup Size: n/a BMI:  Body mass index is 26.93 kg/(m^2). Weight:  193 lb (87.544 kg) Tech Comments:  n/a   Nuclear Med Study 1 or 2 day study: 1 day  Stress Test Type:  Stress Order Authorizing Provider:  Dorn Lesches, MD Resting Radionuclide: Technetium 16m Sestamibi  Resting Radionuclide Dose: 10.5 mCi Stress Radionuclide:  Technetium 16m Sestamibi  Stress Radionuclide Dose: 31.2 mCi   Stress Protocol Rest HR: 95 Stress HR: 125 Rest BP: 133/96 Stress BP: 181/84 Exercise Time (min): 7. METS: 7.0  Predicted Max HR: 143 bpm % Max HR: 87.41 bpm Rate Pressure Product: 77374  Dose of Adenosine (mg):  n/a Dose of Lexiscan: n/a mg Dose of Atropine (mg): n/a Dose of Dobutamine: n/a mcg/kg/min (at max HR) Stress Test Technologist: Ninetta Lunger, CCT Nuclear  Technologist:Elizabeth Young,CNMT  Rest Procedure:  Myocardial perfusion imaging was performed at rest 45 minutes following the intravenous administration of Technetium 16m Sestamibi. Stress Procedure:  The patient performed treadmill exercise using a Bruce  Protocol for 7 minutes. The patient stopped due to severe SOB and denied any chest pain.  There were no significant ST-T wave changes.  Technetium 16m Sestamibi was injected IV at peak exercise and myocardial perfusion imaging was performed after a brief delay.  Transient Ischemic Dilatation (Normal <1.22):  1.00  QGS EDV:  76 ml QGS ESV:  29 ml LV Ejection Fraction: 62%     Rest ECG: NSR - Normal EKG  Stress ECG: No significant change from baseline ECG  QPS Raw Data Images:  Normal; no motion artifact; normal heart/lung ratio. Stress Images:  Small focal mild apical defect Rest Images:  Very mild small region of apical thinning. Subtraction (SDS):  No significant reversibility is appreciated.  Impression Exercise Capacity:  Good exercise capacity. BP Response:  Normal blood pressure response. Clinical Symptoms:  Shortness of breath ECG Impression:  No significant ST segment change suggestive of ischemia. Comparison with Prior Nuclear Study: No significant change from previous study  Overall Impression:  Low risk stress nuclear study demonstrating a small mild focal apical defect (extent 4%) without significant ischemia which may be contributed by attenuation.  LV Wall Motion:  NL LV Function, EF 62%; NL Wall Motion   KELLY,THOMAS A, MD  02/20/2015 5:57 PM   ECHOCARDIOGRAM  ECHOCARDIOGRAM COMPLETE 11/23/2021  Narrative ECHOCARDIOGRAM REPORT    Patient Name:   JAMONE GARRIDO [facility] Date of Exam: 11/23/2021 Medical Rec #:  [id number]       Height:       70.0 in Accession #:    [id number]      Weight:       187.0 lb Date of Birth:  [birth year]       BSA:          2.029 m Patient Age:    86 years        BP:           138/68  mmHg Patient Gender: M               HR:           104 bpm. Exam Location:  [address]  Procedure: 2D Echo, 3D Echo, Cardiac Doppler, Color Doppler and Strain Analysis  Indications:    R07.9 Chest pain  History:        Patient has prior history of Echocardiogram examinations, most recent 06/30/2021. Signs/Symptoms:Dyspnea; Risk Factors:Hypertension and Former Smoker. Lung cancer. Obesity.  Sonographer:    Marshia Lawyer BS, RDCS Referring Phys: [phone] JONATHAN J BERRY  IMPRESSIONS   1. Compared to echo from Aug 2022, no significant change in overall LVEF. 2. Global longitudinal strain is -18.1%. Left ventricular ejection fraction, by estimation, is 50%%. The left ventricle has low normal function. Indeterminate diastolic filling due to E-A fusion. 3. Right ventricular systolic function is low normal. The right ventricular size is normal. 4. Mild mitral valve regurgitation. 5. The aortic valve is tricuspid. Aortic valve regurgitation is mild. Aortic valve sclerosis is present, with no evidence of aortic valve stenosis. 6. The inferior vena cava is normal in size with greater than 50% respiratory variability, suggesting right atrial pressure of 3 mmHg.  Comparison(s): 06/30/21. EF 45-50%.  FINDINGS Left Ventricle: Global longitudinal strain is -18.1%. Left ventricular ejection fraction, by estimation, is 50%%. The left ventricle has low normal function. The left ventricular internal cavity size was normal in size. There is no left ventricular hypertrophy. Indeterminate diastolic filling due to E-A fusion.  Right Ventricle: The right ventricular size is normal. Right vetricular wall thickness was not assessed. Right ventricular systolic function is low normal.  Left Atrium: Left atrial size was normal in size.  Right Atrium: Right atrial size was normal in size.  Pericardium: There is no evidence of pericardial effusion.  Mitral Valve: There is mild thickening of the mitral valve  leaflet(s). Mild mitral valve regurgitation.  Tricuspid Valve: The tricuspid valve is normal in structure. Tricuspid valve regurgitation is trivial.  Aortic Valve: The aortic valve is tricuspid. Aortic valve regurgitation is mild. Aortic valve sclerosis is present, with no evidence of aortic valve stenosis.  Pulmonic Valve: The pulmonic valve was grossly normal. Pulmonic valve regurgitation is not visualized.  Aorta: The aortic root and ascending aorta are structurally normal, with no evidence of dilitation.  Venous: The inferior vena cava is normal in size with greater than 50% respiratory variability, suggesting right atrial pressure of 3 mmHg.  IAS/Shunts: No atrial level shunt detected by color flow Doppler.   LEFT VENTRICLE PLAX  2D LVIDd:         4.40 cm   Diastology LVIDs:         3.20 cm   LV e' medial:    7.56 cm/s LV PW:         1.10 cm   LV E/e' medial:  12.6 LV IVS:        1.00 cm   LV e' lateral:   8.66 cm/s LVOT diam:     2.20 cm   LV E/e' lateral: 11.0 LV SV:         73 LV SV Index:   36        2D Longitudinal Strain LVOT Area:     3.80 cm  2D Strain GLS (A2C):   -19.4 % 2D Strain GLS (A3C):   -17.7 % 2D Strain GLS (A4C):   -17.2 % 2D Strain GLS Avg:     -18.1 %  3D Volume EF: 3D EF:        49 % LV EDV:       123 ml LV ESV:       63 ml LV SV:        60 ml  RIGHT VENTRICLE             IVC RV Basal diam:  3.50 cm     IVC diam: 1.80 cm RV S prime:     13.40 cm/s TAPSE (M-mode): 2.0 cm  LEFT ATRIUM           Index        RIGHT ATRIUM           Index LA diam:      4.10 cm 2.02 cm/m   RA Pressure: 3.00 mmHg LA Vol (A2C): 49.4 ml 24.35 ml/m  RA Area:     20.30 cm LA Vol (A4C): 24.1 ml 11.88 ml/m  RA Volume:   60.30 ml  29.73 ml/m AORTIC VALVE LVOT Vmax:   108.00 cm/s LVOT Vmean:  77.000 cm/s LVOT VTI:    0.192 m  AORTA Ao Root diam: 3.40 cm Ao Asc diam:  3.50 cm  MITRAL VALVE               TRICUSPID VALVE Estimated RAP:  3.00 mmHg MV Decel Time: 169  msec MV E velocity: 95.40 cm/s  SHUNTS MV A velocity: 79.70 cm/s  Systemic VTI:  0.19 m MV E/A ratio:  1.20        Systemic Diam: 2.20 cm  Vina Gull MD Electronically signed by Vina Gull MD Signature Date/Time: 11/23/2021/12:56:46 PM    Final      CT SCANS  CT CORONARY MORPH W/CTA COR W/SCORE 04/10/2015  Addendum 04/10/2015 11:44 AM ADDENDUM REPORT: 04/10/2015 11:41  CLINICAL DATA:  89 year old male with hyperlipidemia, family history of premature CAD, chest pain and equivocal stress test.  EXAM: Cardiac/Coronary  CT  TECHNIQUE: The patient was scanned on a Philips 256 scanner.  FINDINGS: A 120 kV prospective scan was triggered in the descending thoracic aorta at 111 HU's. Axial non-contrast 3 mm slices were carried out through the heart. The data set was analyzed on a dedicated work station and scored using the Agatson method. Gantry rotation speed was 270 msecs and collimation was .9 mm. 7.5 of iv Metoprolol  and 0.8 mg of sl NTG was given. The 3D data set was reconstructed in 5% intervals of the 67-82 % of the R-R cycle. Diastolic phases were analyzed on a dedicated work station using  MPR, MIP and VRT modes. The patient received 80 cc of contrast.  Aorta: Normal caliber of the aortic root and visualized portion of the ascending and descending aorta. Minimal diffuse calcifications in the descending thoracic aorta. No dissection.  Aortic Valve:  Trileaflet, no calcifications.  Coronary Arteries:  Normal coronary origin.  Right dominance.  RCA is large caliber dominant vessel that gives rise to PDA and has no plaque.  Left main is a large caliber vessel with no plaque.  LAD is a large caliber and long vessel that wraps around the apex and gives rise to one diagonal branch. There is minimal calcified plaque in the proximal LAD associated with 0-25% stenosis, otherwise no plaque in mid-distal LAD or diagonal branch.  LCX is a moderate caliber vessel that gives  rise to one small OM1, a large OM2 and a posterolateral artery. There is no plaque.  IMPRESSION: 1. Coronary calcium  score of 3.4. This was 13 percentile for age and sex matched control.  2.  Normal coronary origin.  Right dominance.  3. Mild non-obstructive plaque. Risk factor modification is recommended.  Leim Moose   Electronically Signed By: Leim Moose On: 04/10/2015 11:41  Narrative EXAM: OVER-READ INTERPRETATION  CT CHEST  The following report is an over-read performed by radiologist Dr. Elsie Ko [facility] on 04/10/2015. This over-read does not include interpretation of cardiac or coronary anatomy or pathology. The coronary CTA interpretation by the cardiologist is attached.  COMPARISON:  Chest CT 01/14/2015.  FINDINGS: There are stable postsurgical changes status post right upper lobe resection. Radiation changes around the right hilum with resulting architectural distortion, scarring and bronchiectasis are grossly stable. There is stable nodular thickening along the inferior pulmonary ligament. No suspicious nodules are demonstrated within the visualized lungs. There are no enlarged mediastinal lymph nodes. Small hiatal hernia appears unchanged.  IMPRESSION: The visualized lower chest demonstrates no significant changes compared with a chest CT done 3 months ago. Vascular findings are dictated separately  Electronically Signed: By: Elsie Perone M.D. On: 04/10/2015 10:11     ______________________________________________________________________________________________       Laboratory Data: High Sensitivity Troponin:  No results for input(s): TROPONINIHS in the last 720 hours.   Chemistry Recent Labs  Lab 08/08/24 0942 08/08/24 1208 08/09/24 0635  NA 139 141 143  K 4.2 4.2 4.0  CL 106 109 109  CO2 21*  --  26  GLUCOSE 116* 100* 112*  BUN 20 17 17   CREATININE 1.16 1.20 1.01  CALCIUM  9.4  --  8.6*   GFRNONAA >60  --  >60  ANIONGAP 13  --  8    Recent Labs  Lab 08/08/24 0942  PROT 6.5  ALBUMIN 4.2  AST 25  ALT 33  ALKPHOS 77  BILITOT 0.9   Lipids No results for input(s): CHOL, TRIG, HDL, LABVLDL, LDLCALC, CHOLHDL in the last 168 hours.  Hematology Recent Labs  Lab 08/08/24 0942 08/08/24 1208 08/09/24 0635  WBC 10.5  --  9.5  RBC 4.70  --  3.91*  HGB 11.1* 10.9* 9.3*  HCT 38.5* 32.0* 33.1*  MCV 81.9  --  84.7  MCH 23.6*  --  23.8*  MCHC 28.8*  --  28.1*  RDW 16.2*  --  16.4*  PLT 233  --  177   Thyroid No results for input(s): TSH, FREET4 in the last 168 hours.  BNP Recent Labs  Lab 08/08/24 0942 08/09/24 0635  PROBNP 1,466.0* 1,292.0*    DDimer  Recent Labs  Lab 08/08/24 1408  DDIMER 1.03*    Radiology/Studies:  VAS US  LOWER EXTREMITY VENOUS (DVT) (7a-7p) Result Date: 08/08/2024  Lower Venous DVT Study Patient Name:  DALLEN BUNTE [facility]  Date of Exam:   08/08/2024 Medical Rec #: [id number]        Accession #:    [id number] Date of Birth: [birth year]        Patient Gender: M Patient Age:   38 years Exam Location:  [facility] Procedure:      VAS US  LOWER EXTREMITY VENOUS (DVT) Referring Phys: WARREN BARRETT --------------------------------------------------------------------------------  Indications: Swelling, Edema, and Pain.  Comparison Study: Previous study on 5.6.2024. Performing Technologist: Edilia Elden Appl  Examination Guidelines: A complete evaluation includes B-mode imaging, spectral Doppler, color Doppler, and power Doppler as needed of all accessible portions of each vessel. Bilateral testing is considered an integral part of a complete examination. Limited examinations for reoccurring indications may be performed as noted. The reflux portion of the exam is performed with the patient in reverse Trendelenburg.  +---------+---------------+---------+-----------+----------+--------------+ RIGHT     CompressibilityPhasicitySpontaneityPropertiesThrombus Aging +---------+---------------+---------+-----------+----------+--------------+ CFV      Full           Yes      Yes                                 +---------+---------------+---------+-----------+----------+--------------+ SFJ      Full           Yes      Yes                                 +---------+---------------+---------+-----------+----------+--------------+ FV Prox  Full                                                        +---------+---------------+---------+-----------+----------+--------------+ FV Mid   Full                                                        +---------+---------------+---------+-----------+----------+--------------+ FV DistalFull                                                        +---------+---------------+---------+-----------+----------+--------------+ PFV      Full                                                        +---------+---------------+---------+-----------+----------+--------------+ POP      Full           Yes      Yes                                 +---------+---------------+---------+-----------+----------+--------------+  PTV      Full                                                        +---------+---------------+---------+-----------+----------+--------------+ PERO     Full                                                        +---------+---------------+---------+-----------+----------+--------------+   +---------+---------------+---------+-----------+----------+--------------+ LEFT     CompressibilityPhasicitySpontaneityPropertiesThrombus Aging +---------+---------------+---------+-----------+----------+--------------+ CFV      Full           Yes      Yes                                 +---------+---------------+---------+-----------+----------+--------------+ SFJ      Full           Yes      Yes                                  +---------+---------------+---------+-----------+----------+--------------+ FV Prox  Full                                                        +---------+---------------+---------+-----------+----------+--------------+ FV Mid   Full                                                        +---------+---------------+---------+-----------+----------+--------------+ FV DistalFull                                                        +---------+---------------+---------+-----------+----------+--------------+ PFV      Full                                                        +---------+---------------+---------+-----------+----------+--------------+ POP      Full           Yes      Yes                                 +---------+---------------+---------+-----------+----------+--------------+ PTV      Full                                                        +---------+---------------+---------+-----------+----------+--------------+  PERO     Full                                                        +---------+---------------+---------+-----------+----------+--------------+     Summary: BILATERAL: - No evidence of deep vein thrombosis seen in the lower extremities, bilaterally. -No evidence of popliteal cyst, bilaterally.   *See table(s) above for measurements and observations.    Preliminary    CT Angio Chest PE W and/or Wo Contrast Result Date: 08/08/2024 CLINICAL DATA:  Positive D-dimer.  Evaluate for pulmonary embolus. EXAM: CT ANGIOGRAPHY CHEST WITH CONTRAST TECHNIQUE: Multidetector CT imaging of the chest was performed using the standard protocol during bolus administration of intravenous contrast. Multiplanar CT image reconstructions and MIPs were obtained to evaluate the vascular anatomy. RADIATION DOSE REDUCTION: This exam was performed according to the departmental dose-optimization program which includes automated exposure control,  adjustment of the mA and/or kV according to patient size and/or use of iterative reconstruction technique. CONTRAST:  75mL OMNIPAQUE  IOHEXOL  350 MG/ML SOLN COMPARISON:  01/23/2024. FINDINGS: Cardiovascular: Assessment of the segmental and subsegmental pulmonary arteries is challenging due to poor contrast opacification. Otherwise, no central or lobar pulmonary embolus. Atherosclerotic calcification of the aorta and coronary arteries. Heart is enlarged. No pericardial effusion. Mediastinum/Nodes: No pathologically enlarged mediastinal, hilar or axillary lymph nodes. Esophagus is grossly unremarkable. Lungs/Pleura: Image quality is degraded by expiratory phase imaging and respiratory motion. Right upper lobectomy. Vague 12 mm ground-glass nodule in the superior segment right lower lobe (6/48), stable. Post radiation pulmonary retraction in the perihilar right lung. Small right pleural effusion and trace left pleural effusion. Mild basilar septal thickening. Airway is otherwise unremarkable. Upper Abdomen: 2.0 cm hyperdense lesion in the left kidney, 39 Hounsfield units, previously 2.4 cm and fluid in density on 10/12/2021. Findings suggest a slightly involuted cyst with proteinaceous or hemorrhagic debris. No specific follow-up necessary. Gastric wall thickening. Visualized portions of the liver, gallbladder, adrenal glands, kidneys, spleen, pancreas, stomach and bowel are otherwise grossly unremarkable. No upper abdominal adenopathy. Musculoskeletal: Degenerative changes in the spine. Review of the MIP images confirms the above findings. IMPRESSION: 1. Negative for central or lobar pulmonary embolus. Segmental and subsegmental pulmonary arteries cannot be assessed due to poor opacification and respiratory motion. 2. Mild congestive heart failure. 3. 12 mm ground-glass nodule in the superior segment right lower lobe, stable. Recommend continued attention on follow-up. 4. Gastric wall thickening. 5. Aortic  atherosclerosis (ICD10-I70.0). Coronary artery calcification. Electronically Signed   By: Newell Eke M.D.   On: 08/08/2024 13:32   DG Chest 2 View Result Date: 08/08/2024 EXAM: 2 VIEW(S) XRAY OF THE CHEST 08/08/2024 09:59:00 AM COMPARISON: 10/06/2023 CLINICAL HISTORY: SHOB, leg swelling. FINDINGS: LUNGS AND PLEURA: Mildly increased bandlike density at the right lung base favoring atelectasis. Right lung postsurgical changes with volume loss. HEART AND MEDIASTINUM: Mild enlargement of the cardiopericardial silhouette. BONES AND SOFT TISSUES: Thoracic spondylosis. Note: The frontal projection is rotated to the right, reducing diagnostic sensitivity and specificity. IMPRESSION: 1. Mildly increased bandlike density at the right lung base, favoring atelectasis. 2. Right lung postsurgical changes with volume loss. 3. Mild enlargement of the cardiopericardial silhouette. 4. Thoracic spondylosis. Electronically signed by: Ryan Salvage MD 08/08/2024 10:28 AM EDT RP Workstation: [id number]     Assessment and Plan:  Acute on chronic HFpEF Venous insufficiency  Mild MR -Patient has had issues with lower extremity swelling for 3+ years.  Venous reflux studies in 03/2023 showed evidence of venous insufficiency - Most recent echo from 11/2021 showed EF 50%, mildly reduced RV systolic function, mild MR - Patient has been on Lasix  for his lower extremity swelling.  Presented with worsening lower extremity swelling, shortness of breath, 10 pounds weight gain.  Symptoms have been going on since Monday 10/6  - In the ED, proBNP 1466.  CTA chest with evidence of mild congestive heart failure - Patient given 1 dose IV Lasix  yesterday with reported good urine output.  Continues to have crackles in lung bases and lower extremity swelling.  Very symptomatic with orthopnea, shortness of breath despite use of supplemental oxygen - Increase Lasix  to 40 mg twice daily - Continue strict I's/O's, daily weights - Start  Farxiga 10 mg daily and spiro 12.5 mg daily  - Echocardiogram pending this admission  Mild, nonobstructive CAD - Noted on coronary CTA in 2016.  Coronary calcium  score 3.4 at that time (13th percentile)  - Denies chest pain  - Continue ASA 81 mg daily and repatha   HLD  - Previously intolerant of statins, continue Repatha  History of lung cancer - Previously had VATS procedure, chemo, radiation in 2014.  Continues to follow with oncology. - CTA chest on admission showed 12 mm ground glass nodule in the superior segment right lower lobe.  This was stable. - Continue outpatient monitoring through oncology   Risk Assessment/Risk Scores:   [place]  Heart Association (NYHA) Functional Class NYHA Class III     For questions or updates, please contact [facility] Please consult [email or web address] for contact info under    Signed, Rollo FABIENE Louder, PA-C  08/09/2024 9:22 AM   I have seen and examined the patient along with Rollo FABIENE Louder, PA-C.  I have reviewed the chart, notes and new data.  I agree with PA/NP's note.  Key new complaints: Remains short of breath at rest.  Denies chest pain.  Has not had palpitations, dizziness or syncope.  No clear recent overindulgence in sodium rich diet.  Has not missed his medications. Key examination changes: Bilateral lung rales, 1+ pedal and ankle edema bilaterally, regular rate and rhythm, no audible murmurs rubs or gallops. Key new findings / data: ECG shows sinus rhythm with a chronic right bundle branch block.  QRS voltage is relatively low.  Also reviewed his echocardiogram from 2023.  This showed mildly depressed LV systolic function although global longitudinal strain was normal at -18%.  The strain pattern does not support a diagnosis of cardiac amyloidosis.  Difficult to assess diastolic function since there was EA fusion. CT chest shows findings consistent with mild pulmonary edema, no cardiomegaly, mild aortic  atherosclerosis and coronary artery calcification. Elevated proBNP.  PLAN: Continue diuretics.    Reevaluate echocardiogram.  Hopefully we can get an assessment of diastolic function.  ECG vaguely suggestive of amyloidosis, but his previous echocardiogram does not support this diagnosis at all.    Previous assessment for CAD w CT angio in 2016 suggested low long term risk of serious problems, but if there are regional wall motion abnormalities may reconsider coronary evaluation.  Talah Cookston, MD, FACC [facility] [phone] 08/09/2024, 11:17 AM

## 2024-08-09 NOTE — Progress Notes (Signed)
  Echocardiogram 2D Echocardiogram has been performed.  Alvin Hardy, RDCS 08/09/2024, 1:14 PM

## 2024-08-09 NOTE — Progress Notes (Signed)
 Progress Note   Patient: Alvin Hardy FMW:[id number] DOB: 13-Mar-1935 DOA: 08/08/2024     1 DOS: the patient was seen and examined on 08/09/2024   Brief hospital course: 89yo with h/o HTN, lung CA, bipolar d/o, and chronic HFpEF who presented on 10/8 with edema and SOB. No hypoxia. He was diagnosed with acute on chronic diastolic CHF and given Lasix . Echo pending.   Assessment and Plan:  Acute on chronic heart failure with preserved EF Presented with dyspnea, orthopnea, LE edema, moderately elevated BNP and report of significant weight gain Last documented echo January 2023 with EF 50%, indeterminate diastolic dysfunction Admitted to telemetry Heart healthy diet with 1800 cc fluid restriction IV Lasix  40 mg BID 2D echo Cardiology consultation requested Starting GDMT with Farxiga and spironolactone   Lower extremity edema Chronic venous stasis, possibly worse due to current CHF Wears compression stockings D-dimer mildly elevated DVT US  negative Treat heart failure as above   Abnormal troponin No report of chest pain Minimal elevation No acute EKG changes Likely due to demand ischemia in the setting of CHF  Continue ASA  HLD Statin intolerant Continue Repatha   BPH Continue finasteride    Normocytic anemia Stable   Neuropathy Continue gabapentin    Bipolar 1 Continue lithium  nightly  Lung CA S/p lobectomy, chemo/rads Stable RLL nodule on CTA  Followed by Dr. Sherrod with annual visits  DNR I have discussed code status with the patient and he would not desire resuscitation and would prefer to die a natural death should that situation arise. Patient will need a gold out of facility DNR form at the time of discharge       Consultants: None   Procedures: Echocardiogram 10/9 DVT US  10/9   Antibiotics: None  30 Day Unplanned Readmission Risk Score    Flowsheet Row ED to Hosp-Admission (Current) from 08/08/2024 in [place] 4TH FLOOR PROGRESSIVE CARE AND  UROLOGY  30 Day Unplanned Readmission Risk Score (%) 14.37 Filed at 08/09/2024 1600    This score is the patient's risk of an unplanned readmission within 30 days of being discharged (0 -100%). The score is based on dignosis, age, lab data, medications, orders, and past utilization.   Low:  0-14.9   Medium: 15-21.9   High: 22-29.9   Extreme: 30 and above           Subjective: Ongoing mild orthopnea, improved from presentation.  Reports 10 pound weight gain of late.   Objective: Vitals:   08/09/24 1427 08/09/24 1821  BP:  119/69  Pulse:  93  Resp: 20   Temp:  98.6 F (37 C)  SpO2:  99%    Intake/Output Summary (Last 24 hours) at 08/09/2024 1824 Last data filed at 08/09/2024 1820 Gross per 24 hour  Intake 120 ml  Output 900 ml  Net -780 ml   Filed Weights   08/08/24 1024  Weight: 86.6 kg    Exam:  General:  Appears calm and comfortable and is in NAD, on [place] O2 Eyes:  normal lids, iris ENT:  grossly normal hearing, lips & tongue, mmm Cardiovascular:  RRR. 2+ LE edema.  Respiratory:   CTA bilaterally with no wheezes/rales/rhonchi.  Normal respiratory effort. Abdomen:  soft, NT, ND Skin:  no rash or induration seen on limited exam Musculoskeletal:  grossly normal tone BUE/BLE, good ROM, no bony abnormality Psychiatric:  grossly normal mood and affect, speech fluent and appropriate, AOx3 Neurologic:  CN 2-12 grossly intact, moves all extremities in coordinated fashion  Data Reviewed: I have reviewed the patient's lab results since admission.  Pertinent labs for today include:   Glucose 112 Pro-BNP 1292, down from 1466 HS troponin 67, 60 WBC 9.5 Hgb 9.3     Family Communication: None present      Code Status: Limited: Do not attempt resuscitation (DNR) -DNR-LIMITED -Do Not Intubate/DNI   Disposition: Status is: Inpatient Remains inpatient appropriate because: ongoing management     Time spent: 50 minutes  Unresulted Labs (From admission, onward)      Start     Ordered   08/10/24 0500  CBC with Differential/Platelet  Tomorrow morning,   R        08/09/24 1824   08/10/24 0500  Basic metabolic panel with GFR  Tomorrow morning,   R        08/09/24 1824             Author: Delon Herald, MD 08/09/2024 6:24 PM  For on call review [email or web address].

## 2024-08-09 NOTE — Hospital Course (Signed)
 89yo with h/o HTN, lung CA, bipolar d/o, and chronic HFpEF who presented on 10/8 with edema and SOB. No hypoxia. He was diagnosed with acute on chronic diastolic CHF and given Lasix . Echo unremarkable.

## 2024-08-10 DIAGNOSIS — I5031 Acute diastolic (congestive) heart failure: Secondary | ICD-10-CM | POA: Diagnosis not present

## 2024-08-10 DIAGNOSIS — I5033 Acute on chronic diastolic (congestive) heart failure: Secondary | ICD-10-CM | POA: Diagnosis not present

## 2024-08-10 LAB — CBC WITH DIFFERENTIAL/PLATELET
Abs Immature Granulocytes: 0.05 K/uL (ref 0.00–0.07)
Basophils Absolute: 0.1 K/uL (ref 0.0–0.1)
Basophils Relative: 1 %
Eosinophils Absolute: 0.3 K/uL (ref 0.0–0.5)
Eosinophils Relative: 3 %
HCT: 40.3 % (ref 39.0–52.0)
Hemoglobin: 11.8 g/dL — ABNORMAL LOW (ref 13.0–17.0)
Immature Granulocytes: 1 %
Lymphocytes Relative: 15 %
Lymphs Abs: 1.5 K/uL (ref 0.7–4.0)
MCH: 24.3 pg — ABNORMAL LOW (ref 26.0–34.0)
MCHC: 29.3 g/dL — ABNORMAL LOW (ref 30.0–36.0)
MCV: 82.9 fL (ref 80.0–100.0)
Monocytes Absolute: 0.9 K/uL (ref 0.1–1.0)
Monocytes Relative: 9 %
Neutro Abs: 6.7 K/uL (ref 1.7–7.7)
Neutrophils Relative %: 71 %
Platelets: 217 K/uL (ref 150–400)
RBC: 4.86 MIL/uL (ref 4.22–5.81)
RDW: 16.1 % — ABNORMAL HIGH (ref 11.5–15.5)
WBC: 9.5 K/uL (ref 4.0–10.5)
nRBC: 0 % (ref 0.0–0.2)

## 2024-08-10 LAB — BASIC METABOLIC PANEL WITH GFR
Anion gap: 9 (ref 5–15)
BUN: 18 mg/dL (ref 8–23)
CO2: 29 mmol/L (ref 22–32)
Calcium: 8.9 mg/dL (ref 8.9–10.3)
Chloride: 105 mmol/L (ref 98–111)
Creatinine, Ser: 1.22 mg/dL (ref 0.61–1.24)
GFR, Estimated: 57 mL/min — ABNORMAL LOW (ref >60)
Glucose, Bld: 96 mg/dL (ref 70–99)
Potassium: 3.8 mmol/L (ref 3.5–5.1)
Sodium: 143 mmol/L (ref 135–145)

## 2024-08-10 MED ORDER — LITHIUM CARBONATE ER 300 MG PO TBCR
600.0000 mg | EXTENDED_RELEASE_TABLET | Freq: Once | ORAL | Status: DC
  Filled 2024-08-10: qty 2

## 2024-08-10 MED ORDER — LITHIUM CARBONATE ER 300 MG PO TBCR
600.0000 mg | EXTENDED_RELEASE_TABLET | ORAL | Status: DC
  Administered 2024-08-10: 600 mg via ORAL
  Filled 2024-08-10: qty 2

## 2024-08-10 MED ORDER — SENNOSIDES-DOCUSATE SODIUM 8.6-50 MG PO TABS
1.0000 | ORAL_TABLET | Freq: Two times a day (BID) | ORAL | Status: DC
  Administered 2024-08-11: 1 via ORAL
  Filled 2024-08-10 (×2): qty 1

## 2024-08-10 MED ORDER — LITHIUM CARBONATE ER 300 MG PO TBCR: 300.0000 mg | EXTENDED_RELEASE_TABLET | ORAL | Status: DC

## 2024-08-10 NOTE — Progress Notes (Signed)
 Progress Note   Patient: Alvin Hardy FMW:[id number] DOB: May 16, 1935 DOA: 08/08/2024     2 DOS: the patient was seen and examined on 08/10/2024   Brief hospital course: 89yo with h/o HTN, lung CA, bipolar d/o, and chronic HFpEF who presented on 10/8 with edema and SOB. No hypoxia. He was diagnosed with acute on chronic diastolic CHF and given Lasix . Echo unremarkable.  Assessment and Plan:  Acute on chronic combined heart failure Presented with dyspnea, orthopnea, LE edema, moderately elevated BNP and report of significant weight gain Last documented echo January 2023 with EF 50%, indeterminate diastolic dysfunction; repeat echo with similar mildly reduced EF and grade 1 DD Admitted to telemetry Heart healthy diet with 1800 cc fluid restriction IV Lasix  40 mg BID, rapidly improving Cardiology consultation requested Starting GDMT with Farxiga and spironolactone Possible dc to home 10/11 if he has ongoing improvement   Lower extremity edema Chronic venous stasis, possibly worse due to current CHF Wears compression stockings D-dimer mildly elevated DVT US  negative Treat heart failure as above   Abnormal troponin No report of chest pain Minimal elevation No acute EKG changes Likely due to demand ischemia in the setting of CHF  Continue ASA  Stage 3a CKD Appears to be stable at this time Attempt to avoid nephrotoxic medications  HLD Statin intolerant Continue Repatha   BPH Continue finasteride    Normocytic anemia Stable   Neuropathy Continue gabapentin    Bipolar 1 Continue lithium  nightly   Lung CA S/p lobectomy, chemo/rads Stable RLL nodule on CTA  Followed by Dr. Sherrod with annual visits   DNR I have discussed code status with the patient and he would not desire resuscitation and would prefer to die a natural death should that situation arise. Patient will need a gold out of facility DNR form at the time of discharge        Consultants: Cardiology    Procedures: Echocardiogram 10/9 DVT US  10/9   Antibiotics: None   30 Day Unplanned Readmission Risk Score    Flowsheet Row ED to Hosp-Admission (Current) from 08/08/2024 in [place] 4TH FLOOR PROGRESSIVE CARE AND UROLOGY  30 Day Unplanned Readmission Risk Score (%) 12.57 Filed at 08/10/2024 0801    This score is the patient's risk of an unplanned readmission within 30 days of being discharged (0 -100%). The score is based on dignosis, age, lab data, medications, orders, and past utilization.   Low:  0-14.9   Medium: 15-21.9   High: 22-29.9   Extreme: 30 and above           Subjective: Feeling better.  No longer needing O2.  Ambulating better.  LE edema resolved.   Objective: Vitals:   08/10/24 0920 08/10/24 1322  BP: 128/74 126/72  Pulse: 80 92  Resp:    Temp:  98.3 F (36.8 C)  SpO2: 98% 94%    Intake/Output Summary (Last 24 hours) at 08/10/2024 1641 Last data filed at 08/10/2024 1400 Gross per 24 hour  Intake 1810 ml  Output 3600 ml  Net -1790 ml   Filed Weights   08/08/24 1024 08/10/24 0500  Weight: 86.6 kg 81.8 kg    Exam:  General:  Appears calm and comfortable and is in NAD, on [place] O2 Eyes:  normal lids, iris ENT:  grossly normal hearing, lips & tongue, mmm Cardiovascular:  RRR. Trace LE edema.  Respiratory:   CTA bilaterally with no wheezes/rales/rhonchi.  Normal respiratory effort. Abdomen:  soft, NT, ND Skin:  no rash  or induration seen on limited exam Musculoskeletal:  grossly normal tone BUE/BLE, good ROM, no bony abnormality Psychiatric:  grossly normal mood and affect, speech fluent and appropriate, AOx3 Neurologic:  CN 2-12 grossly intact, moves all extremities in coordinated fashion  Data Reviewed: I have reviewed the patient's lab results since admission.  Pertinent labs for today include:   Stable BMP, GFR 57 Stable CBC     Family Communication: Wife was present      Code Status: Limited: Do not attempt resuscitation (DNR)  -DNR-LIMITED -Do Not Intubate/DNI    Disposition: Status is: Inpatient Remains inpatient appropriate because: ongoing monitoring     Time spent: 50 minutes  Unresulted Labs (From admission, onward)     Start     Ordered   Unscheduled  CBC with Differential/Platelet  Tomorrow morning,   R        08/10/24 1641   Unscheduled  Basic metabolic panel with GFR  Tomorrow morning,   R        08/10/24 1641             Author: Delon Herald, MD 08/10/2024 4:41 PM  For on call review [email or web address].

## 2024-08-10 NOTE — Progress Notes (Signed)
  Progress Note  Patient Name: Alvin Hardy Date of Encounter: 08/10/2024 [facility] Cardiologist: Dorn Lesches, MD   Interval Summary   Substantially better, no longer needs oxygen, excellent diuresis. Able to lie supine in bed. Based on his history, it is possible that he has had PND for several months, prior to this admission.  He would wake up with an anxious feeling in the middle of the night and go and sit in his recliner, after 20-30 minutes he would be able to fall asleep again.  Echocardiogram shows borderline left ventricular systolic function with EF 50% and global hypokinesis, mild LVH.  Is also mildly reduced RV systolic function.  Diastolic function was interpreted as showing impaired relaxation but it actually shows pseudonormal filling.  Pulmonary vein flow is diastolic dominant.  The E: A ratio is 1:1 and E/e' ratio is 18.  This is consistent with elevated mean left atrial pressure.  There is only mild dilation of the left atrium and there are no serious valvular abnormalities.  The inferior vena cava was not dilated.  There is mild concentric LVH.  Vital Signs Vitals:   08/10/24 0250 08/10/24 0500 08/10/24 0536 08/10/24 0920  BP: 117/72  112/74 128/74  Pulse: 88  85 80  Resp: 16  16   Temp: 97.8 F (36.6 C)  97.6 F (36.4 C)   TempSrc: Oral  Oral   SpO2: 99%  99% 98%  Weight:  81.8 kg    Height:        Intake/Output Summary (Last 24 hours) at 08/10/2024 1241 Last data filed at 08/10/2024 1130 Gross per 24 hour  Intake 1570 ml  Output 3300 ml  Net -1730 ml      08/10/2024    5:00 AM 08/08/2024   10:24 AM 01/25/2024    3:35 PM  Last 3 Weights  Weight (lbs) 180 lb 6.4 oz 191 lb 189 lb 8 oz  Weight (kg) 81.829 kg 86.637 kg 85.957 kg      Telemetry/ECG  Sinus rhythm- Personally Reviewed  Physical Exam  GEN: No acute distress.   Neck: Mildly elevated JVP, roughly 6 cm Cardiac: RRR, no murmurs, rubs, or gallops.  Respiratory: Clear to  auscultation bilaterally. GI: Soft, nontender, non-distended  MS: No edema  Assessment & Plan   Acute on chronic HFpEF (borderline LVEF 50%), improving rapidly with diuretics. The echo picture is not entirely typical, but could represent cardiac amyloidosis.  Will plan outpatient PYP scan or equivalent. There is been no change in BUN and the creatinine is only marginally changed, continue IV diuretics ( I suspect he will be ready to switch to oral diuretics tomorrow).  Have added SGLT2 inhibitor. He was accustomed to her usual weight of 183 pounds, but his weight is already down to 180 pounds and he still probably has some excess fluid.  Need to challenge his dry weight. We have discussed the importance of dietary sodium restriction, daily weight monitoring and the signs and symptoms of heart failure exacerbation in detail.    For questions or updates, please contact [facility] Please consult [email or web address] for contact info under         Signed, Jerel Balding, MD

## 2024-08-10 NOTE — TOC Initial Note (Signed)
 Transition of Care [facility]) - Initial/Assessment Note   Patient Details  Name: Alvin Hardy MRN: [id number] Date of Birth: 1935-05-23  Transition of Care [facility]) CM/SW Contact:    Duwaine GORMAN Aran, LCSW Phone Number: 08/10/2024, 9:46 AM  Clinical Narrative: Care management consulted for heart failure screening. Patient had 1 hospital admission in the past 6 months and no ED visits. Heart failure navigation team consulted. Consult to be cleared at this time.  Expected Discharge Plan: Home/Self Care ([facility]) Barriers to Discharge: Continued Medical Work up  Expected Discharge Plan and Services In-house Referral: Clinical Social Work Living arrangements for the past 2 months: Independent Living Facility           DME Arranged: N/A DME Agency: NA  Prior Living Arrangements/Services Living arrangements for the past 2 months: Independent Living Facility Lives with:: Spouse Patient language and need for interpreter reviewed:: Yes Do you feel safe going back to the place where you live?: Yes      Need for Family Participation in Patient Care: No (Comment) Care giver support system in place?: Yes (comment) Criminal Activity/Legal Involvement Pertinent to Current Situation/Hospitalization: No - Comment as needed  Activities of Daily Living ADL Screening (condition at time of admission) Independently performs ADLs?: Yes (appropriate for developmental age) Is the patient deaf or have difficulty hearing?: Yes Does the patient have difficulty seeing, even when wearing glasses/contacts?: No Does the patient have difficulty concentrating, remembering, or making decisions?: No  Emotional Assessment Alcohol  / Substance Use: Not Applicable Psych Involvement: No (comment)  Admission diagnosis:  Acute diastolic (congestive) heart failure (HCC) [I50.31] Congestive heart failure, unspecified HF chronicity, unspecified heart failure type (HCC) [I50.9] Patient Active Problem List   Diagnosis  Date Noted   Acute diastolic (congestive) heart failure (HCC) 08/08/2024   History of lumbar laminectomy 05/11/2023   Numbness of lower limb 05/11/2023   Spinal stenosis of lumbar region with neurogenic claudication 11/17/2022   Bilateral lower extremity edema 10/07/2021   Anxiety disorder, unspecified 07/08/2021   Cough, unspecified 07/08/2021   Encounter for prophylactic measures, unspecified 07/08/2021   Fever, unspecified 07/08/2021   Long term (current) use of aspirin  07/08/2021   Pain, unspecified 07/08/2021   Retention of urine, unspecified 07/08/2021   Acute respiratory failure with hypoxia (HCC)    Multifocal pneumonia 06/28/2021   Sepsis (HCC) 06/28/2021   History of lung cancer 06/28/2021   Essential hypertension 06/28/2021   Prolonged QT interval 06/28/2021   Osteoarthritis of left glenohumeral joint 12/22/2018   Numbness of hand 09/12/2018   Aortic atherosclerosis 07/10/2018   Bursitis of elbow 07/10/2018   Diarrhea 07/10/2018   DJD (degenerative joint disease) 07/10/2018   Finger pain 07/10/2018   Foot pain 07/10/2018   GERD (gastroesophageal reflux disease) 07/10/2018   Hoarseness 07/10/2018   Impaired fasting glucose 07/10/2018   Low back pain 07/10/2018   Neck mass 07/10/2018   Obesity 07/10/2018   Other psoriasis 07/10/2018   Postnasal drip 07/10/2018   Prostatic hypertrophy 07/10/2018   Rectal bleeding 07/10/2018   Sinusitis 07/10/2018   Sore throat 07/10/2018   UTI (urinary tract infection) 07/10/2018   Dysuria 07/10/2018   Trochanteric bursitis, right hip 02/27/2018   Cervical radiculitis 11/11/2017   Ulnar neuropathy 11/11/2017   Spinal stenosis, lumbar region with neurogenic claudication 06/28/2017   Stenosis of intervertebral foramina 06/28/2017   Dyspnea 02/04/2014   Hyperlipidemia 01/25/2014   Chest pain 01/25/2014   Claudication 01/25/2014   BPH (benign prostatic hyperplasia)    Hypogonadism  male    Bipolar 1 disorder [facility])    ED  (erectile dysfunction)    Neuromuscular disorder (HCC)    Laryngopharyngeal reflux    Gilbert's syndrome    Morton's neuroma    Lung cancer, Right Upper Lobe    Chronic obstructive pulmonary disease, unspecified (HCC) 08/01/2012   Nutritional deficiency, unspecified 08/01/2012   Vitamin D  deficiency, unspecified 08/01/2012   PCP:  Shayne Anes, MD Pharmacy:   [facility] - [place] [address] [place] [address] Phone: [phone] Fax: [phone]  Social Drivers of Health (SDOH) Social History: SDOH Screenings   Food Insecurity: No Food Insecurity (08/09/2024)  Housing: Low Risk  (08/09/2024)  Transportation Needs: No Transportation Needs (08/09/2024)  Utilities: Not At Risk (08/09/2024)  Social Connections: Moderately Isolated (08/09/2024)  Tobacco Use: Medium Risk (08/08/2024)   SDOH Interventions:    Readmission Risk Interventions     No data to display

## 2024-08-11 ENCOUNTER — Other Ambulatory Visit: Payer: Self-pay | Admitting: Physician Assistant

## 2024-08-11 ENCOUNTER — Telehealth: Payer: Self-pay | Admitting: Physician Assistant

## 2024-08-11 DIAGNOSIS — R9431 Abnormal electrocardiogram [ECG] [EKG]: Secondary | ICD-10-CM

## 2024-08-11 DIAGNOSIS — Z66 Do not resuscitate: Secondary | ICD-10-CM | POA: Diagnosis present

## 2024-08-11 DIAGNOSIS — N179 Acute kidney failure, unspecified: Secondary | ICD-10-CM

## 2024-08-11 DIAGNOSIS — I5043 Acute on chronic combined systolic (congestive) and diastolic (congestive) heart failure: Secondary | ICD-10-CM | POA: Diagnosis not present

## 2024-08-11 DIAGNOSIS — I517 Cardiomegaly: Secondary | ICD-10-CM

## 2024-08-11 DIAGNOSIS — I5033 Acute on chronic diastolic (congestive) heart failure: Secondary | ICD-10-CM | POA: Diagnosis not present

## 2024-08-11 LAB — CBC WITH DIFFERENTIAL/PLATELET
Abs Immature Granulocytes: 0.06 K/uL (ref 0.00–0.07)
Basophils Absolute: 0.1 K/uL (ref 0.0–0.1)
Basophils Relative: 1 %
Eosinophils Absolute: 0.3 K/uL (ref 0.0–0.5)
Eosinophils Relative: 3 %
HCT: 44.7 % (ref 39.0–52.0)
Hemoglobin: 13 g/dL (ref 13.0–17.0)
Immature Granulocytes: 1 %
Lymphocytes Relative: 13 %
Lymphs Abs: 1.6 K/uL (ref 0.7–4.0)
MCH: 24.1 pg — ABNORMAL LOW (ref 26.0–34.0)
MCHC: 29.1 g/dL — ABNORMAL LOW (ref 30.0–36.0)
MCV: 82.9 fL (ref 80.0–100.0)
Monocytes Absolute: 1.1 K/uL — ABNORMAL HIGH (ref 0.1–1.0)
Monocytes Relative: 9 %
Neutro Abs: 8.9 K/uL — ABNORMAL HIGH (ref 1.7–7.7)
Neutrophils Relative %: 73 %
Platelets: 273 K/uL (ref 150–400)
RBC: 5.39 MIL/uL (ref 4.22–5.81)
RDW: 15.9 % — ABNORMAL HIGH (ref 11.5–15.5)
WBC: 12.1 K/uL — ABNORMAL HIGH (ref 4.0–10.5)
nRBC: 0 % (ref 0.0–0.2)

## 2024-08-11 LAB — BASIC METABOLIC PANEL WITH GFR
Anion gap: 11 (ref 5–15)
BUN: 20 mg/dL (ref 8–23)
CO2: 27 mmol/L (ref 22–32)
Calcium: 9.2 mg/dL (ref 8.9–10.3)
Chloride: 100 mmol/L (ref 98–111)
Creatinine, Ser: 1.33 mg/dL — ABNORMAL HIGH (ref 0.61–1.24)
GFR, Estimated: 52 mL/min — ABNORMAL LOW (ref >60)
Glucose, Bld: 103 mg/dL — ABNORMAL HIGH (ref 70–99)
Potassium: 3.7 mmol/L (ref 3.5–5.1)
Sodium: 138 mmol/L (ref 135–145)

## 2024-08-11 MED ORDER — DAPAGLIFLOZIN PROPANEDIOL 10 MG PO TABS: 10.0000 mg | ORAL_TABLET | Freq: Every day | ORAL | 0 refills | Status: DC

## 2024-08-11 MED ORDER — POTASSIUM CHLORIDE CRYS ER 20 MEQ PO TBCR: 20.0000 meq | EXTENDED_RELEASE_TABLET | Freq: Every day | ORAL | 0 refills | Status: DC

## 2024-08-11 MED ORDER — SPIRONOLACTONE 25 MG PO TABS: 12.5000 mg | ORAL_TABLET | Freq: Every day | ORAL | 0 refills | Status: DC

## 2024-08-11 MED ORDER — FUROSEMIDE 40 MG PO TABS: 40.0000 mg | ORAL_TABLET | Freq: Every day | ORAL | 0 refills | Status: DC

## 2024-08-11 MED ORDER — FUROSEMIDE 40 MG PO TABS: 40.0000 mg | ORAL_TABLET | Freq: Every day | ORAL | Status: DC

## 2024-08-11 NOTE — Progress Notes (Signed)
Patient discharged: Home with family  Via: Wheelchair   Discharge paperwork given: to patient and family  Reviewed with teach back  IV and telemetry disconnected  Belongings given to patient    

## 2024-08-11 NOTE — Telephone Encounter (Signed)
   Transition of Care Follow-up Phone Call Request    Patient Name: Alvin Hardy Date of Birth: 06-20-1935 Date of Encounter: 08/11/2024  Primary Care Provider:  Shayne Anes, MD Primary Cardiologist:  Dorn Lesches, MD  Alvin Hardy has been scheduled for a transition of care follow up appointment with a [facility] provider:  Dr. Dorn Lesches  14 day TOC  Please reach out to Alvin Hardy within 48 hours of discharge to confirm appointment and review transition of care protocol questionnaire. Anticipated discharge date: 08/11/2024  Scot Ford, [place]  08/11/2024, 10:07 AM

## 2024-08-11 NOTE — Discharge Summary (Addendum)
 Physician Discharge Summary   Patient: Alvin Hardy MRN: [id number] DOB: January 12, 1935  Admit date:     08/08/2024  Discharge date: 08/11/24  Discharge Physician: Delon Herald   PCP: Shayne Anes, MD   Recommendations at discharge:   Medication changes: Change Lasix  (furosemide ) to 40 mg daily Add Aldactone (spironolactone) 12.5 mg daily Add Farxiga (dapagliflozin) 10 mg daily Increase potassium (Klor-Con ) to 20 mEq daily Follow up with cardiology for outpatient amyloidosis evaluation and possible REVEAL study participation Follow up with Dr. Shayne in 1-2 weeks  Discharge Diagnoses: Principal Problem:   Acute on chronic combined systolic and diastolic CHF (congestive heart failure) (HCC) Active Problems:   BPH (benign prostatic hyperplasia)   Bipolar 1 disorder (HCC)   Lung cancer, Right Upper Lobe   Hyperlipidemia   Essential hypertension   Bilateral lower extremity edema   DNR (do not resuscitate)    Hospital Course: 89yo with h/o HTN, lung CA, bipolar d/o, and chronic HFpEF who presented on 10/8 with edema and SOB. No hypoxia. He was diagnosed with acute on chronic diastolic CHF and given Lasix . Echo unremarkable.  Assessment and Plan:  Acute on chronic combined heart failure Presented with dyspnea, orthopnea, LE edema, moderately elevated BNP and report of significant weight gain Last documented echo January 2023 with EF 50%, indeterminate diastolic dysfunction; repeat echo with similar mildly reduced EF and grade 1 DD Admitted to telemetry Heart healthy diet with 1800 cc fluid restriction IV Lasix  40 mg BID, rapidly improving Cardiology consultation requested Starting GDMT with Farxiga and spironolactone DC to home 10/11 given ongoing improvement   Lower extremity edema Chronic venous stasis, possibly worse due to current CHF Wears compression stockings D-dimer mildly elevated DVT US  negative Treat heart failure as above   Abnormal troponin No report of  chest pain Minimal elevation No acute EKG changes Likely due to demand ischemia in the setting of CHF  Continue ASA   Stage 3a CKD Appears to be stable at this time Attempt to avoid nephrotoxic medications   HLD Statin intolerant Continue Repatha   BPH Continue finasteride  and tamsulosin    Normocytic anemia Stable   Neuropathy Continue gabapentin    Bipolar 1 Continue lithium  nightly, galantamine, and Klonopin    Lung CA/COPD S/p lobectomy, chemo/rads Stable RLL nodule on CTA  Followed by Dr. Sherrod with annual visits Continue Anoro   DNR I have discussed code status with the patient and he would not desire resuscitation and would prefer to die a natural death should that situation arise. Patient will need a gold out of facility DNR form at the time of discharge         Consultants: Cardiology   Procedures: Echocardiogram 10/9 DVT US  10/9   Antibiotics: None       Pain control - [place]  Controlled Substance Reporting System database was reviewed. and patient was instructed, not to drive, operate heavy machinery, perform activities at heights, swimming or participation in water  activities or provide baby-sitting services while on Pain, Sleep and Anxiety Medications; until their outpatient Physician has advised to do so again. Also recommended to not to take more than prescribed Pain, Sleep and Anxiety Medications.   Disposition: Home Diet recommendation:  Cardiac diet DISCHARGE MEDICATION: Allergies as of 08/11/2024       Reactions   Codeine Anaphylaxis   Hydrocodone-acetaminophen  Nausea Only      Atorvastatin Other (See Comments)   Leg pain   Coenzyme Q10 Other (See Comments)   Tightness in legs  Dexamethasone  Other (See Comments)   Pt says it is contraindication due to the lithium  he is taking   Ezetimibe Other (See Comments)   Muscle aches   Fenofibrate Other (See Comments)   Muscle spasms   Hydrocodone Nausea Only   Other Nausea  Only, Other (See Comments)   Patient remarked that all pain medications make him nauseated.   Penicillins Other (See Comments)   Ineffective   Pravastatin Other (See Comments)   Aching in legs   Propoxyphene Nausea Only   Rosuvastatin  Other (See Comments)   Bad pains in the legs   Statins Other (See Comments)   Lipitor, Crestor , Zocor all caused muscle aches   Zoloft [sertraline Hcl] Other (See Comments)   Reacts with lithium         Medication List     STOP taking these medications    HYDROmorphone  2 MG tablet Commonly known as: DILAUDID    potassium chloride  10 MEQ tablet Commonly known as: KLOR-CON        TAKE these medications    Artificial Tears 1 % ophthalmic solution Generic drug: carboxymethylcellulose Place 1 drop into both eyes 3 (three) times daily as needed (for irritation).   aspirin  EC 81 MG tablet Take 81 mg by mouth every morning.   clonazePAM  0.5 MG tablet Commonly known as: KLONOPIN  Take 1 tablet (0.5 mg total) by mouth at bedtime.   cyanocobalamin  1000 MCG tablet Take 1,000 mcg by mouth every morning.   dapagliflozin propanediol 10 MG Tabs tablet Commonly known as: FARXIGA Take 1 tablet (10 mg total) by mouth daily. Start taking on: August 12, 2024   esomeprazole 40 MG capsule Commonly known as: NEXIUM Take 40 mg by mouth daily before breakfast.   finasteride  5 MG tablet Commonly known as: PROSCAR  Take 5 mg by mouth daily before breakfast.   furosemide  40 MG tablet Commonly known as: LASIX  Take 1 tablet (40 mg total) by mouth daily. Start taking on: August 12, 2024 What changed:  medication strength See the new instructions.   gabapentin  300 MG capsule Commonly known as: NEURONTIN  Take 600-900 mg by mouth See admin instructions. Take 600 mg by mouth in the morning and 900 mg at bedtime   galantamine 8 MG 24 hr capsule Commonly known as: RAZADYNE ER Take 8 mg by mouth daily with breakfast.   lidocaine  5 % Commonly known  as: Lidoderm  Place 1 patch onto the skin daily. Remove & Discard patch within 12 hours or as directed by MD What changed:  when to take this reasons to take this additional instructions   lithium  carbonate 300 MG ER tablet Commonly known as: LITHOBID  Take 300-600 mg by mouth See admin instructions. Take 300 mg by mouth at bedtime, alternating with 600 mg every other night   Nasacort  Allergy 24HR 55 MCG/ACT Aero nasal inhaler Generic drug: triamcinolone  Place 2 sprays into the nose in the morning and at bedtime.   potassium chloride  SA 20 MEQ tablet Commonly known as: KLOR-CON  M Take 1 tablet (20 mEq total) by mouth daily. Start taking on: August 12, 2024   Repatha SureClick 140 MG/ML Soaj Generic drug: Evolocumab Inject 140 mg as directed every 14 (fourteen) days.   spironolactone 25 MG tablet Commonly known as: ALDACTONE Take 0.5 tablets (12.5 mg total) by mouth daily. Start taking on: August 12, 2024   tamsulosin  0.4 MG Caps capsule Commonly known as: FLOMAX  Take 0.4 mg by mouth daily after breakfast.   testosterone  cypionate 200 MG/ML injection Commonly known  as: DEPOTESTOSTERONE CYPIONATE Inject 200 mg into the muscle every 28 (twenty-eight) days.   umeclidinium-vilanterol 62.5-25 MCG/ACT Aepb Commonly known as: ANORO ELLIPTA  Inhale 1 puff into the lungs in the morning.   Vitamin D -3 25 MCG (1000 UT) Caps Take 1,000 Units by mouth every morning.        Follow-up Information     Court Dorn PARAS, MD Follow up on 08/22/2024.   Specialties: Cardiology, Radiology Why: 2:30PM. Post hospital follow up. Obtain BMET blood work the same day after the visit in lab on the first floor, no need to fast Contact information: [address] [place] [address] [phone]         [facility] A Dept of [employer] Follow up.   Specialty: Cardiology Why: office scheduler will contact you to arrange PYP scan to rule out cardiac  amyloidosis. Contact information: [address] [place]  [address] [phone]        Shayne Anes, MD. Call in 1 week(s).   Specialty: Internal Medicine Contact information: [address] [place] [address] [phone]                Discharge Exam:   Subjective: Feeling better, sleeping comfortably.   Objective: Vitals:   08/11/24 0817 08/11/24 1225  BP: 120/71 133/79  Pulse: 91 95  Resp:    Temp:  98.7 F (37.1 C)  SpO2: 96% 95%    Intake/Output Summary (Last 24 hours) at 08/11/2024 1341 Last data filed at 08/11/2024 1200 Gross per 24 hour  Intake 780 ml  Output 2100 ml  Net -1320 ml   Filed Weights   08/08/24 1024 08/10/24 0500 08/11/24 0710  Weight: 86.6 kg 81.8 kg 80.3 kg    Exam:  General:  Appears calm and comfortable and is in NAD, on RA Eyes:  normal lids, iris ENT:  grossly normal hearing, lips & tongue, mmm Cardiovascular:  RRR. No LE edema.  Respiratory:   CTA bilaterally with no wheezes/rales/rhonchi.  Normal respiratory effort. Abdomen:  soft, NT, ND Skin:  no rash or induration seen on limited exam Musculoskeletal:  grossly normal tone BUE/BLE, good ROM, no bony abnormality Psychiatric: somnolent/blunted mood and affect, speech fluent and appropriate, AOx3 Neurologic:  CN 2-12 grossly intact, moves all extremities in coordinated fashion  Data Reviewed: I have reviewed the patient's lab results since admission.  Pertinent labs for today include:   BUN 20/Creatinine 1.33/GFR 52, mildly worse than baseline but not AKI WBC 12.1    Condition at discharge: improving  The results of significant diagnostics from this hospitalization (including imaging, microbiology, ancillary and laboratory) are listed below for reference.   Imaging Studies: ECHOCARDIOGRAM COMPLETE Result Date: 08/09/2024    ECHOCARDIOGRAM REPORT   Patient Name:   TYRAIL GRANDFIELD [facility] Date of Exam: 08/09/2024 Medical Rec #:  [id number]        Height:       70.0 in Accession #:    [id number]      Weight:       191.0 lb Date of Birth:  Jan 28, 1935       BSA:          2.047 m Patient Age:    89 years        BP:           125/75 mmHg Patient Gender: M               HR:  89 bpm. Exam Location:  Inpatient Procedure: 2D Echo, Color Doppler and Cardiac Doppler (Both Spectral and Color            Flow Doppler were utilized during procedure). Indications:    CHF- Acute Diastolic I50.31  History:        Patient has prior history of Echocardiogram examinations, most                 recent 11/23/2021. CHF, COPD, Signs/Symptoms:Dyspnea and Chest                 Pain; Risk Factors:Dyslipidemia.  Sonographer:    Koleen Popper RDCS Referring Phys: [id number] MIR M [facility]  Sonographer Comments: Image acquisition challenging due to respiratory motion. IMPRESSIONS  1. Left ventricular ejection fraction, by estimation, is 50%. The left ventricle has mildly decreased function. The left ventricle demonstrates global hypokinesis. There is mild concentric left ventricular hypertrophy. Left ventricular diastolic parameters are consistent with Grade I diastolic dysfunction (impaired relaxation).  2. Right ventricular systolic function is mildly reduced. The right ventricular size is normal. Tricuspid regurgitation signal is inadequate for assessing PA pressure.  3. The mitral valve is normal in structure. No evidence of mitral valve regurgitation. No evidence of mitral stenosis.  4. The aortic valve is tricuspid. There is mild calcification of the aortic valve. Aortic valve regurgitation is trivial. No aortic stenosis is present.  5. The inferior vena cava is normal in size with greater than 50% respiratory variability, suggesting right atrial pressure of 3 mmHg.  6. A small pericardial effusion is present. The pericardial effusion is circumferential. FINDINGS  Left Ventricle: Left ventricular ejection fraction, by estimation, is 50%. The left ventricle has mildly  decreased function. The left ventricle demonstrates global hypokinesis. The left ventricular internal cavity size was normal in size. There is mild concentric left ventricular hypertrophy. Left ventricular diastolic parameters are consistent with Grade I diastolic dysfunction (impaired relaxation). Right Ventricle: The right ventricular size is normal. No increase in right ventricular wall thickness. Right ventricular systolic function is mildly reduced. Tricuspid regurgitation signal is inadequate for assessing PA pressure. Left Atrium: Left atrial size was normal in size. Right Atrium: Right atrial size was normal in size. Pericardium: A small pericardial effusion is present. The pericardial effusion is circumferential. Mitral Valve: The mitral valve is normal in structure. There is mild calcification of the mitral valve leaflet(s). Mild mitral annular calcification. No evidence of mitral valve regurgitation. No evidence of mitral valve stenosis. Tricuspid Valve: The tricuspid valve is normal in structure. Tricuspid valve regurgitation is not demonstrated. Aortic Valve: The aortic valve is tricuspid. There is mild calcification of the aortic valve. Aortic valve regurgitation is trivial. No aortic stenosis is present. Pulmonic Valve: The pulmonic valve was normal in structure. Pulmonic valve regurgitation is not visualized. Aorta: The aortic root is normal in size and structure. Venous: The inferior vena cava is normal in size with greater than 50% respiratory variability, suggesting right atrial pressure of 3 mmHg. IAS/Shunts: No atrial level shunt detected by color flow Doppler.  LEFT VENTRICLE PLAX 2D LVIDd:         4.40 cm      Diastology LVIDs:         3.10 cm      LV e' medial:    5.02 cm/s LV PW:         1.30 cm      LV E/e' medial:  18.7 LV IVS:  1.30 cm      LV e' lateral:   5.50 cm/s LVOT diam:     1.80 cm      LV E/e' lateral: 17.1 LV SV:         45 LV SV Index:   22 LVOT Area:     2.54 cm  LV  Volumes (MOD) LV vol d, MOD A4C: 111.0 ml LV vol s, MOD A4C: 50.7 ml LV SV MOD A4C:     111.0 ml RIGHT VENTRICLE             IVC RV S prime:     12.50 cm/s  IVC diam: 1.50 cm TAPSE (M-mode): 2.4 cm LEFT ATRIUM             Index        RIGHT ATRIUM           Index LA diam:        3.90 cm 1.91 cm/m   RA Area:     17.60 cm LA Vol (A2C):   48.8 ml 23.84 ml/m  RA Volume:   41.30 ml  20.18 ml/m LA Vol (A4C):   51.5 ml 25.16 ml/m LA Biplane Vol: 54.3 ml 26.53 ml/m  AORTIC VALVE LVOT Vmax:   103.00 cm/s LVOT Vmean:  68.100 cm/s LVOT VTI:    0.176 m  AORTA Ao Root diam: 2.90 cm Ao Asc diam:  2.90 cm MITRAL VALVE MV Area (PHT): 3.77 cm    SHUNTS MV Decel Time: 201 msec    Systemic VTI:  0.18 m MV E velocity: 93.80 cm/s  Systemic Diam: 1.80 cm MV A velocity: 90.40 cm/s MV E/A ratio:  1.04 Dalton McleanMD Electronically signed by Ezra Kanner Signature Date/Time: 08/09/2024/2:35:15 PM    Final    VAS US  LOWER EXTREMITY VENOUS (DVT) (7a-7p) Result Date: 08/09/2024  Lower Venous DVT Study Patient Name:  WALLACE GAPPA [facility]  Date of Exam:   08/08/2024 Medical Rec #: [id number]        Accession #:    [id number] Date of Birth: September 01, 1935        Patient Gender: M Patient Age:   76 years Exam Location:  [facility] Procedure:      VAS US  LOWER EXTREMITY VENOUS (DVT) Referring Phys: WARREN BARRETT --------------------------------------------------------------------------------  Indications: Swelling, Edema, and Pain.  Comparison Study: Previous study on 5.6.2024. Performing Technologist: Edilia Elden Appl  Examination Guidelines: A complete evaluation includes B-mode imaging, spectral Doppler, color Doppler, and power Doppler as needed of all accessible portions of each vessel. Bilateral testing is considered an integral part of a complete examination. Limited examinations for reoccurring indications may be performed as noted. The reflux portion of the exam is performed with the patient in reverse Trendelenburg.   +---------+---------------+---------+-----------+----------+--------------+ RIGHT    CompressibilityPhasicitySpontaneityPropertiesThrombus Aging +---------+---------------+---------+-----------+----------+--------------+ CFV      Full           Yes      Yes                                 +---------+---------------+---------+-----------+----------+--------------+ SFJ      Full           Yes      Yes                                 +---------+---------------+---------+-----------+----------+--------------+ FV Prox  Full                                                        +---------+---------------+---------+-----------+----------+--------------+ FV Mid   Full                                                        +---------+---------------+---------+-----------+----------+--------------+ FV DistalFull                                                        +---------+---------------+---------+-----------+----------+--------------+ PFV      Full                                                        +---------+---------------+---------+-----------+----------+--------------+ POP      Full           Yes      Yes                                 +---------+---------------+---------+-----------+----------+--------------+ PTV      Full                                                        +---------+---------------+---------+-----------+----------+--------------+ PERO     Full                                                        +---------+---------------+---------+-----------+----------+--------------+   +---------+---------------+---------+-----------+----------+--------------+ LEFT     CompressibilityPhasicitySpontaneityPropertiesThrombus Aging +---------+---------------+---------+-----------+----------+--------------+ CFV      Full           Yes      Yes                                  +---------+---------------+---------+-----------+----------+--------------+ SFJ      Full           Yes      Yes                                 +---------+---------------+---------+-----------+----------+--------------+ FV Prox  Full                                                        +---------+---------------+---------+-----------+----------+--------------+  FV Mid   Full                                                        +---------+---------------+---------+-----------+----------+--------------+ FV DistalFull                                                        +---------+---------------+---------+-----------+----------+--------------+ PFV      Full                                                        +---------+---------------+---------+-----------+----------+--------------+ POP      Full           Yes      Yes                                 +---------+---------------+---------+-----------+----------+--------------+ PTV      Full                                                        +---------+---------------+---------+-----------+----------+--------------+ PERO     Full                                                        +---------+---------------+---------+-----------+----------+--------------+     Summary: BILATERAL: - No evidence of deep vein thrombosis seen in the lower extremities, bilaterally. -No evidence of popliteal cyst, bilaterally.   *See table(s) above for measurements and observations. Electronically signed by Debby Robertson on 08/09/2024 at 11:52:12 AM.    Final    CT Angio Chest PE W and/or Wo Contrast Result Date: 08/08/2024 CLINICAL DATA:  Positive D-dimer.  Evaluate for pulmonary embolus. EXAM: CT ANGIOGRAPHY CHEST WITH CONTRAST TECHNIQUE: Multidetector CT imaging of the chest was performed using the standard protocol during bolus administration of intravenous contrast. Multiplanar CT image reconstructions and MIPs were  obtained to evaluate the vascular anatomy. RADIATION DOSE REDUCTION: This exam was performed according to the departmental dose-optimization program which includes automated exposure control, adjustment of the mA and/or kV according to patient size and/or use of iterative reconstruction technique. CONTRAST:  75mL OMNIPAQUE  IOHEXOL  350 MG/ML SOLN COMPARISON:  01/23/2024. FINDINGS: Cardiovascular: Assessment of the segmental and subsegmental pulmonary arteries is challenging due to poor contrast opacification. Otherwise, no central or lobar pulmonary embolus. Atherosclerotic calcification of the aorta and coronary arteries. Heart is enlarged. No pericardial effusion. Mediastinum/Nodes: No pathologically enlarged mediastinal, hilar or axillary lymph nodes. Esophagus is grossly unremarkable. Lungs/Pleura: Image quality is degraded by expiratory phase imaging and respiratory motion. Right upper lobectomy. Vague 12 mm ground-glass nodule in the superior segment right lower lobe (6/48),  stable. Post radiation pulmonary retraction in the perihilar right lung. Small right pleural effusion and trace left pleural effusion. Mild basilar septal thickening. Airway is otherwise unremarkable. Upper Abdomen: 2.0 cm hyperdense lesion in the left kidney, 39 Hounsfield units, previously 2.4 cm and fluid in density on 10/12/2021. Findings suggest a slightly involuted cyst with proteinaceous or hemorrhagic debris. No specific follow-up necessary. Gastric wall thickening. Visualized portions of the liver, gallbladder, adrenal glands, kidneys, spleen, pancreas, stomach and bowel are otherwise grossly unremarkable. No upper abdominal adenopathy. Musculoskeletal: Degenerative changes in the spine. Review of the MIP images confirms the above findings. IMPRESSION: 1. Negative for central or lobar pulmonary embolus. Segmental and subsegmental pulmonary arteries cannot be assessed due to poor opacification and respiratory motion. 2. Mild  congestive heart failure. 3. 12 mm ground-glass nodule in the superior segment right lower lobe, stable. Recommend continued attention on follow-up. 4. Gastric wall thickening. 5. Aortic atherosclerosis (ICD10-I70.0). Coronary artery calcification. Electronically Signed   By: Newell Eke M.D.   On: 08/08/2024 13:32   DG Chest 2 View Result Date: 08/08/2024 EXAM: 2 VIEW(S) XRAY OF THE CHEST 08/08/2024 09:59:00 AM COMPARISON: 10/06/2023 CLINICAL HISTORY: SHOB, leg swelling. FINDINGS: LUNGS AND PLEURA: Mildly increased bandlike density at the right lung base favoring atelectasis. Right lung postsurgical changes with volume loss. HEART AND MEDIASTINUM: Mild enlargement of the cardiopericardial silhouette. BONES AND SOFT TISSUES: Thoracic spondylosis. Note: The frontal projection is rotated to the right, reducing diagnostic sensitivity and specificity. IMPRESSION: 1. Mildly increased bandlike density at the right lung base, favoring atelectasis. 2. Right lung postsurgical changes with volume loss. 3. Mild enlargement of the cardiopericardial silhouette. 4. Thoracic spondylosis. Electronically signed by: Ryan Salvage MD 08/08/2024 10:28 AM EDT RP Workstation: [id number]    Microbiology: Results for orders placed or performed during the hospital encounter of 10/06/23  SARS Coronavirus 2 by RT PCR (hospital order, performed in [facility] hospital lab) *cepheid single result test* Anterior Nasal Swab     Status: None   Collection Time: 10/06/23 10:43 AM   Specimen: Anterior Nasal Swab  Result Value Ref Range Status   SARS Coronavirus 2 by RT PCR NEGATIVE NEGATIVE Final    Comment: Performed at [facility], [address] [place], [place] [address]    Labs: CBC: Recent Labs  Lab 08/08/24 [phone] 08/08/24 1208 08/09/24 9364 08/10/24 0513 08/11/24 0511  WBC 10.5  --  9.5 9.5 12.1*  NEUTROABS 7.9*  --   --  6.7 8.9*  HGB 11.1* 10.9* 9.3* 11.8* 13.0  HCT 38.5* 32.0* 33.1* 40.3 44.7  MCV  81.9  --  84.7 82.9 82.9  PLT 233  --  177 217 273   Basic Metabolic Panel: Recent Labs  Lab 08/08/24 0942 08/08/24 1208 08/09/24 0635 08/10/24 0513 08/11/24 0511  NA 139 141 143 143 138  K 4.2 4.2 4.0 3.8 3.7  CL 106 109 109 105 100  CO2 21*  --  26 29 27   GLUCOSE 116* 100* 112* 96 103*  BUN 20 17 17 18 20   CREATININE 1.16 1.20 1.01 1.22 1.33*  CALCIUM  9.4  --  8.6* 8.9 9.2   Liver Function Tests: Recent Labs  Lab 08/08/24 0942  AST 25  ALT 33  ALKPHOS 77  BILITOT 0.9  PROT 6.5  ALBUMIN 4.2   CBG: No results for input(s): GLUCAP in the last 168 hours.  Discharge time spent: greater than 30 minutes.  Signed: Delon Herald, MD [facility] 08/11/2024

## 2024-08-11 NOTE — Progress Notes (Addendum)
  Progress Note  Patient Name: Alvin Hardy Date of Encounter: 08/11/2024 [facility] Cardiologist: Dorn Lesches, MD   Interval Summary   Excellent diuresis last 24 hours.  Remains in normal sinus rhythm, normal BP. Slight bump in creatinine although BUN remains unchanged, with weight now 6 pounds lower than what he had previously estimated to be his usual weight.  Vital Signs Vitals:   08/10/24 2131 08/11/24 0553 08/11/24 0710 08/11/24 0817  BP: 120/66 119/72  120/71  Pulse: 95 91  91  Resp: 16 18    Temp: 98.3 F (36.8 C) 97.6 F (36.4 C)    TempSrc: Oral Oral    SpO2: 96% 92%  96%  Weight:   80.3 kg   Height:        Intake/Output Summary (Last 24 hours) at 08/11/2024 0948 Last data filed at 08/11/2024 0900 Gross per 24 hour  Intake 1240 ml  Output 3250 ml  Net -2010 ml      08/11/2024    7:10 AM 08/10/2024    5:00 AM 08/08/2024   10:24 AM  Last 3 Weights  Weight (lbs) 177 lb 1.6 oz 180 lb 6.4 oz 191 lb  Weight (kg) 80.332 kg 81.829 kg 86.637 kg      Telemetry/ECG  Normal sinus rhythm- Personally Reviewed  Physical Exam  GEN: No acute distress.   Neck: No JVD Cardiac: RRR, no murmurs, rubs, or gallops.  Respiratory: Clear to auscultation bilaterally. GI: Soft, nontender, non-distended  MS: No edema  Assessment & Plan  Acute on chronic HFpEF with borderline LVEF 50%. Transition to oral diuretics. Outpatient evaluation for cardiac amyloidosis. Coronary insufficiency felt to be highly unlikely as the cause for his heart failure, with near normal LVEF, absence of anginal regional wall motion abnormalities, absence of ischemic ECG changes and history of normal CT angiography of the coronary arteries with very low coronary calcium  score in 2016. Myeloma screening labs to be sent today. Have spoken to him about possible REVEAL study participation (new tracer for amyloidosis) [facility] will sign off.   The patient is ready for  discharge today from a cardiac standpoint. Medication Recommendations:   Farxiga 10 mg once daily (new) Furosemide  40 mg daily (new dose) Potassium chloride  20 mEq daily (new dose) Spironolactone 12.5 mg daily (new) Other recommendations (labs, testing, etc): Sodium restricted diet; daily weight monitoring; call MD for weight gain more than 3 pounds in 24 hours / 5 pounds in a week or if weight exceeds 180 pounds Follow up as an outpatient: Dorn Lesches, MD 08/22/2024.   In addition to follow-up appointment, will make arrangements for outpatient PYP scan for possible cardiac amyloidosis, outpatient recheck of basic metabolic panel.  For questions or updates, please contact [facility] Please consult [email or web address] for contact info under         Signed, Jerel Balding, MD

## 2024-08-13 ENCOUNTER — Telehealth (HOSPITAL_COMMUNITY): Payer: Self-pay | Admitting: Pharmacy Technician

## 2024-08-13 ENCOUNTER — Other Ambulatory Visit (HOSPITAL_COMMUNITY): Payer: Self-pay

## 2024-08-13 ENCOUNTER — Ambulatory Visit: Payer: Self-pay | Admitting: Cardiovascular Disease

## 2024-08-13 LAB — KAPPA/LAMBDA LIGHT CHAINS
Kappa free light chain: 13.2 mg/L (ref 3.3–19.4)
Kappa, lambda light chain ratio: 1.06 (ref 0.26–1.65)
Lambda free light chains: 12.5 mg/L (ref 5.7–26.3)

## 2024-08-13 LAB — IMMUNOFIXATION, URINE

## 2024-08-13 NOTE — Telephone Encounter (Signed)
 Pharmacy Patient Advocate Encounter  Received notification from [facility] that Prior Authorization for Farxiga 10 mg has been APPROVED from 08/13/2024 to 08/13/2025   PA #/Case ID/Reference #: [id number] KEY: [id number]

## 2024-08-13 NOTE — Telephone Encounter (Signed)
 Patient contacted by clinical team regarding discharge from hospital on 08/11/2024.  Patient understands to follow up with provider Court on 08/16/2024 at 12:30 pm at [facility] office. Patient understands discharge instructions? yes Patient understands medications and how to take them? yes Patient understands to bring all medications to this visit? yes  Patient understands all medications (except pain medications) will be filled by your Cardiologist AFTER your first appointment.  Patient reports appropriate help at home with ADL's and has DME at this time. yes Patient reports they have been contacted by home health if ordered.  no Ask patient:  Are you enrolled in My Chart yes   If no ask patient if they would like to enroll.    Postop: Structural/Cath/Device/(other cardiac invasive procedures) patients:               What is your wound status? Any signs/ symptoms of infection (Temp, redness/ red streaks, swelling, purulent drainage, foul odor or smell)?               Please do not place any creams/ lotions/ or antibiotic ointment on any surgical incisions/ wounds without physician approval.

## 2024-08-14 ENCOUNTER — Other Ambulatory Visit: Payer: Self-pay | Admitting: Physician Assistant

## 2024-08-14 ENCOUNTER — Telehealth (HOSPITAL_COMMUNITY): Payer: Self-pay | Admitting: *Deleted

## 2024-08-14 DIAGNOSIS — I517 Cardiomegaly: Secondary | ICD-10-CM

## 2024-08-14 DIAGNOSIS — R9431 Abnormal electrocardiogram [ECG] [EKG]: Secondary | ICD-10-CM

## 2024-08-14 NOTE — Telephone Encounter (Signed)
 Left message reminding patient of upcoming Amyloid Scan.  Claudene Ronal Quale, RN

## 2024-08-16 ENCOUNTER — Ambulatory Visit (HOSPITAL_COMMUNITY)
Admission: RE | Admit: 2024-08-16 | Discharge: 2024-08-16 | Disposition: A | Discharge status: Home / Self Care | Source: Ambulatory Visit | Attending: Cardiology | Admitting: Cardiology

## 2024-08-16 DIAGNOSIS — R9431 Abnormal electrocardiogram [ECG] [EKG]: Secondary | ICD-10-CM | POA: Diagnosis not present

## 2024-08-16 DIAGNOSIS — I517 Cardiomegaly: Secondary | ICD-10-CM

## 2024-08-16 LAB — MULTIPLE MYELOMA PANEL, SERUM
Albumin SerPl Elph-Mcnc: 3.7 g/dL (ref 2.9–4.4)
Albumin/Glob SerPl: 1.2 (ref 0.7–1.7)
Alpha 1: 0.3 g/dL (ref 0.0–0.4)
Alpha2 Glob SerPl Elph-Mcnc: 1 g/dL (ref 0.4–1.0)
B-Globulin SerPl Elph-Mcnc: 1.2 g/dL (ref 0.7–1.3)
Gamma Glob SerPl Elph-Mcnc: 0.6 g/dL (ref 0.4–1.8)
Globulin, Total: 3.1 g/dL (ref 2.2–3.9)
IgA: 92 mg/dL (ref 61–437)
IgG (Immunoglobin G), Serum: 634 mg/dL (ref 603–1613)
IgM (Immunoglobulin M), Srm: 44 mg/dL (ref 15–143)
Total Protein ELP: 6.8 g/dL (ref 6.0–8.5)

## 2024-08-16 LAB — MYOCARDIAL AMYLOID PLANAR & SPECT: H/CL Ratio: 2.3

## 2024-08-16 MED ORDER — TECHNETIUM TC 99M PYROPHOSPHATE
20.4000 | Freq: Once | INTRAVENOUS | Status: AC
  Administered 2024-08-16: 20.4 via INTRAVENOUS

## 2024-08-22 ENCOUNTER — Encounter: Payer: Self-pay | Admitting: Cardiovascular Disease

## 2024-08-22 ENCOUNTER — Ambulatory Visit: Payer: Self-pay | Admitting: Physician Assistant

## 2024-08-22 ENCOUNTER — Ambulatory Visit: Attending: Cardiology | Admitting: Cardiovascular Disease

## 2024-08-22 VITALS — BP 118/62 | HR 89 | Ht 70.0 in | Wt 180.4 lb

## 2024-08-22 DIAGNOSIS — I5043 Acute on chronic combined systolic (congestive) and diastolic (congestive) heart failure: Secondary | ICD-10-CM

## 2024-08-22 DIAGNOSIS — E782 Mixed hyperlipidemia: Secondary | ICD-10-CM | POA: Diagnosis not present

## 2024-08-22 DIAGNOSIS — N179 Acute kidney failure, unspecified: Secondary | ICD-10-CM | POA: Diagnosis not present

## 2024-08-22 MED ORDER — FUROSEMIDE 40 MG PO TABS: 40.0000 mg | ORAL_TABLET | Freq: Every day | ORAL | 3 refills | Status: AC

## 2024-08-22 MED ORDER — SPIRONOLACTONE 25 MG PO TABS: 12.5000 mg | ORAL_TABLET | Freq: Every day | ORAL | 3 refills | Status: AC

## 2024-08-22 MED ORDER — POTASSIUM CHLORIDE CRYS ER 20 MEQ PO TBCR: 20.0000 meq | EXTENDED_RELEASE_TABLET | Freq: Every day | ORAL | 3 refills | Status: DC

## 2024-08-22 MED ORDER — DAPAGLIFLOZIN PROPANEDIOL 10 MG PO TABS: 10.0000 mg | ORAL_TABLET | Freq: Every day | ORAL | 3 refills | Status: AC

## 2024-08-22 NOTE — Addendum Note (Signed)
 Addended by: LORRENE FEDERICO CROME on: 08/22/2024 03:18 PM   Modules accepted: Orders

## 2024-08-22 NOTE — Assessment & Plan Note (Signed)
 History of hyperlipidemia on Repatha with lipid profile performed 12/15/2023 revealing total cholesterol of 125, LDL 33 and HDL 64.

## 2024-08-22 NOTE — Patient Instructions (Addendum)
 Medication Instructions:  Your physician recommends that you continue on your current medications as directed. Please refer to the Current Medication list given to you today.  *If you need a refill on your cardiac medications before your next appointment, please call your pharmacy*  Lab Work: Your physician recommends that you have labs drawn today: BMET  If you have labs (blood work) drawn today and your tests are completely normal, you will receive your results only by: MyChart Message (if you have MyChart) OR A paper copy in the mail If you have any lab test that is abnormal or we need to change your treatment, we will call you to review the results.   Follow-Up: At [facility], you and your health needs are our priority.  As part of our continuing mission to provide you with exceptional heart care, our providers are all part of one team.  This team includes your primary Cardiologist (physician) and Advanced Practice Providers or APPs (Physician Assistants and Nurse Practitioners) who all work together to provide you with the care you need, when you need it.  Your next appointment:   6 month(s)  Provider:   Orren Fabry, PA-C, Jon Hails, PA-C, Callie Goodrich, PA-C, or Kathleen Johnson, PA-C  Then, Dorn Lesches, MD will plan to see you again in 12 month(s).     [facility] [place] [facility]:

## 2024-08-22 NOTE — Progress Notes (Signed)
 08/22/2024 [facility]   10-08-1935  [id number]  Primary Physician Shayne Anes, MD Primary Cardiologist: Dorn JINNY Lesches MD [place], [place]  HPI:  Alvin Hardy is a 89 y.o.    fit-appearing married Caucasian male father of 2 children, grandfather of 2 grandchildren referred by Dr. Anes Shayne for cardiovascular evaluation.  I last saw him in the office 09/20/2023.  He is accompanied by his wife Alvin Hardy today.  They live at [facility].[place]  He is a retired Hospital doctor disability judge. His cardiovascular risk factor profile is markable for hyperlipidemia intolerant to statin drugs and family history of heart disease with a father who had a myocardial infarction at age 51. He is nonhypertensive, diabetic nor has she smoked in the in the past. He has never had a heart attack or stroke. He was getting occasional chest pain that occurred both with exertion as well as at rest and after eating. He has had lung cancer and has had a VATS procedure by Dr. Army of the right upper lobe along with chemotherapy and radiation therapy. He has seen a gastroenterologist and has had EGD and is going to be referred to Dr. Dante for pulmonary evaluation. A Myoview stress test was performed that was entirely normal as were her lower extremity Doppler studies. Because of the statin intolerance he was recently begun on Repatha by Dr. Shayne the marked reduction in his cholesterol levels. Over the last 6 weeks or so he's had progressive increase in frequency and severity of exertional chest pressure as well as dyspnea on exertion.  He did have a 2D echo and Myoview stress test which were essentially normal.  The chest pain which he was experiencing when I saw him last has resolved.  He was hospitalized for pneumonia from the end of August to the end of September (5 weeks.  He went to [facility] for several weeks and now is home.  He has noticed some left lower extremities edema on furosemide .  A 2D  echocardiogram performed 06/30/2021 revealed a slight decline in LV function with an EF of 45 to 50% with grade 2 diastolic dysfunction.  There are no valvular abnormalities noted.  Since I saw him a year ago he was admitted to the hospital with diastolic heart failure 08/08/2024 was and was discharged 3 days later.  He diuresed 15 pounds with IV furosemide .  An SGLT2 inhibitor was added as well as Aldactone.  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  He is aware of salt restriction.   Current Meds  Medication Sig   ARTIFICIAL TEARS 1 % ophthalmic solution Place 1 drop into both eyes 3 (three) times daily as needed (for irritation).   aspirin  EC 81 MG tablet Take 81 mg by mouth every morning.    Cholecalciferol  (VITAMIN D -3) 1000 UNITS CAPS Take 1,000 Units by mouth every morning.    clonazePAM  (KLONOPIN ) 0.5 MG tablet Take 1 tablet (0.5 mg total) by mouth at bedtime.   cyanocobalamin  1000 MCG tablet Take 1,000 mcg by mouth every morning.    dapagliflozin propanediol (FARXIGA) 10 MG TABS tablet Take 1 tablet (10 mg total) by mouth daily.   esomeprazole (NEXIUM) 40 MG capsule Take 40 mg by mouth daily before breakfast.   finasteride  (PROSCAR ) 5 MG tablet Take 5 mg by mouth daily before breakfast.  furosemide  (LASIX ) 40 MG tablet Take 1 tablet (40 mg total) by mouth daily.   gabapentin  (NEURONTIN ) 300 MG capsule Take 600-900 mg by mouth See admin instructions. Take 600 mg by mouth in the morning and 900 mg at bedtime   galantamine (RAZADYNE ER) 8 MG 24 hr capsule Take 8 mg by mouth daily with breakfast.   lidocaine  (LIDODERM ) 5 % Place 1 patch onto the skin daily. Remove & Discard patch within 12 hours or as directed by MD (Patient taking differently: Place 1 patch onto the skin daily as needed (for pain- Remove & Discard patch within 12 hours or as directed by MD).)   lithium  carbonate  (LITHOBID ) 300 MG CR tablet Take 300-600 mg by mouth See admin instructions. Take 300 mg by mouth at bedtime, alternating with 600 mg every other night   NASACORT  ALLERGY 24HR 55 MCG/ACT AERO nasal inhaler Place 2 sprays into the nose in the morning and at bedtime.   potassium chloride  SA (KLOR-CON  M) 20 MEQ tablet Take 1 tablet (20 mEq total) by mouth daily.   REPATHA SURECLICK 140 MG/ML SOAJ Inject 140 mg as directed every 14 (fourteen) days.   spironolactone (ALDACTONE) 25 MG tablet Take 0.5 tablets (12.5 mg total) by mouth daily.   tamsulosin  (FLOMAX ) 0.4 MG CAPS capsule Take 0.4 mg by mouth daily after breakfast.   testosterone  cypionate (DEPOTESTOTERONE CYPIONATE) 200 MG/ML injection Inject 200 mg into the muscle every 28 (twenty-eight) days.   umeclidinium-vilanterol (ANORO ELLIPTA ) 62.5-25 MCG/ACT AEPB Inhale 1 puff into the lungs in the morning.     Allergies  Allergen Reactions   Codeine Anaphylaxis   Hydrocodone-Acetaminophen  Nausea Only        Atorvastatin Other (See Comments)    Leg pain   Coenzyme Q10 Other (See Comments)    Tightness in legs   Dexamethasone  Other (See Comments)    Pt says it is contraindication due to the lithium  he is taking   Ezetimibe Other (See Comments)    Muscle aches   Fenofibrate Other (See Comments)    Muscle spasms   Hydrocodone Nausea Only   Other Nausea Only and Other (See Comments)    Patient remarked that all pain medications make him nauseated.   Penicillins Other (See Comments)    Ineffective    Pravastatin Other (See Comments)    Aching in legs   Propoxyphene Nausea Only   Rosuvastatin  Other (See Comments)    Bad pains in the legs   Statins Other (See Comments)    Lipitor, Crestor , Zocor all caused muscle aches   Zoloft [Sertraline Hcl] Other (See Comments)    Reacts with lithium     Social History   Socioeconomic History   Marital status: Married    Spouse name: Not on file   Number of children: Not on file   Years of  education: Not on file   Highest education level: Not on file  Occupational History   Not on file  Tobacco Use   Smoking status: Former    Current packs/day: 0.00    Average packs/day: 0.5 packs/day for 24.0 years (12.0 ttl pk-yrs)    Types: Cigarettes, Pipe    Start date: 11/01/1954    Quit date: 11/01/1978    Years since quitting: 45.8   Smokeless tobacco: Never  Vaping Use   Vaping status: Never Used  Substance and Sexual Activity   Alcohol  use: Yes    Comment: occas.   Drug use: No   Sexual activity:  Not on file  Other Topics Concern   Not on file  Social History Narrative   Not on file   Social Drivers of Health   Financial Resource Strain: Not on file  Food Insecurity: No Food Insecurity (08/09/2024)   Hunger Vital Sign    Worried About Running Out of Food in the Last Year: Never true    Ran Out of Food in the Last Year: Never true  Transportation Needs: No Transportation Needs (08/09/2024)   PRAPARE - Administrator, Civil Service (Medical): No    Lack of Transportation (Non-Medical): No  Physical Activity: Not on file  Stress: Not on file  Social Connections: Moderately Isolated (08/09/2024)   Social Connection and Isolation Panel    Frequency of Communication with Friends and Family: Twice a week    Frequency of Social Gatherings with Friends and Family: Once a week    Attends Religious Services: Never    Database administrator or Organizations: No    Attends Banker Meetings: Never    Marital Status: Married  Catering manager Violence: Not At Risk (08/09/2024)   Humiliation, Afraid, Rape, and Kick questionnaire    Fear of Current or Ex-Partner: No    Emotionally Abused: No    Physically Abused: No    Sexually Abused: No     Review of Systems: General: negative for chills, fever, night sweats or weight changes.  Cardiovascular: negative for chest pain, dyspnea on exertion, edema, orthopnea, palpitations, paroxysmal nocturnal dyspnea or  shortness of breath Dermatological: negative for rash Respiratory: negative for cough or wheezing Urologic: negative for hematuria Abdominal: negative for nausea, vomiting, diarrhea, bright red blood per rectum, melena, or hematemesis Neurologic: negative for visual changes, syncope, or dizziness All other systems reviewed and are otherwise negative except as noted above.    Blood pressure 118/62, pulse 89, height 5' 10 (1.778 m), weight 180 lb 6.4 oz (81.8 kg), SpO2 97%.  General appearance: alert and no distress Neck: no adenopathy, no carotid bruit, no JVD, supple, symmetrical, trachea midline, and thyroid not enlarged, symmetric, no tenderness/mass/nodules Lungs: clear to auscultation bilaterally Heart: regular rate and rhythm, S1, S2 normal, no murmur, click, rub or gallop Extremities: extremities normal, atraumatic, no cyanosis or edema Pulses: 2+ and symmetric Skin: Skin color, texture, turgor normal. No rashes or lesions Neurologic: Grossly normal  EKG not performed today      ASSESSMENT AND PLAN:   Hyperlipidemia History of hyperlipidemia on Repatha with lipid profile performed 12/15/2023 revealing total cholesterol of 125, LDL 33 and HDL 64.  Acute on chronic combined systolic and diastolic CHF (congestive heart failure) [facility]) Mr. Bari was admitted to the hospital 08/08/2024 with volume overload, dyspnea and lower extremity edema.  He was diuresed 15 pounds with IV furosemide .  Farxiga was added as was spironolactone.  He is back down to his dry weight.  He is wearing wearing compression stockings.  His dyspnea has improved as well.  He did have a 2D echocardiogram that revealed low normal LV function with an EF of 50%, grade 1 diastolic dysfunction.  He also had a PYP test that was positive for ATTR amyloid.  Based on this, I am going to refer him to the advanced heart failure clinic for further evaluation and treatment of this.     Dorn DOROTHA Lesches MD FACP,FACC,FAHA,  [facility] 08/22/2024 2:55 PM

## 2024-08-22 NOTE — Progress Notes (Signed)
 Cardiac portion was reviewed with patient by Dr. Court today who plan to refer the patient to heart failure service, the non-cardiac portion showed a stable 12 mm pulmonary nodule in the superior segment of right lower lobe. This should be followed by PCP. Looking back patient had multiple CT image dating back to at least 2022 that showed the size of this nodule is stable over the past 3 years.

## 2024-08-22 NOTE — Assessment & Plan Note (Signed)
 Alvin Hardy was admitted to the hospital 08/08/2024 with volume overload, dyspnea and lower extremity edema.  He was diuresed 15 pounds with IV furosemide .  Farxiga was added as was spironolactone.  He is back down to his dry weight.  He is wearing wearing compression stockings.  His dyspnea has improved as well.  He did have a 2D echocardiogram that revealed low normal LV function with an EF of 50%, grade 1 diastolic dysfunction.  He also had a PYP test that was positive for ATTR amyloid.  Based on this, I am going to refer him to the advanced heart failure clinic for further evaluation and treatment of this.

## 2024-08-23 LAB — BASIC METABOLIC PANEL WITH GFR
BUN/Creatinine Ratio: 17 (ref 10–24)
BUN: 21 mg/dL (ref 8–27)
CO2: 20 mmol/L (ref 20–29)
Calcium: 9.6 mg/dL (ref 8.6–10.2)
Chloride: 102 mmol/L (ref 96–106)
Creatinine, Ser: 1.21 mg/dL (ref 0.76–1.27)
Glucose: 101 mg/dL — ABNORMAL HIGH (ref 70–99)
Potassium: 5.1 mmol/L (ref 3.5–5.2)
Sodium: 139 mmol/L (ref 134–144)
eGFR: 58 mL/min/1.73 — ABNORMAL LOW (ref >59)

## 2024-09-03 ENCOUNTER — Telehealth: Payer: Self-pay

## 2024-09-03 NOTE — Telephone Encounter (Signed)
 Pt called last week to ask about his current DNR status. Pt stated that he remembers the DNR conversation c/the MD and consenting to a limited-do not intubate.  Now that he is at home he would like to rescind the DNR-limited and be a full code. CM received guidance from [facility] as follows: The DNR form needs to be completed by the patient and the order entered/signed by the provider. This is a legal document and a part of the patient's medical record. It is not legally binding unless signed by the patient (and/or their legal decision maker) and the attending provider. The patient should be directed to reach out to legal, compliance or their physician to make sure they have any updates in place.  Pt updated today and verbalized understanding.

## 2024-09-06 ENCOUNTER — Ambulatory Visit: Admitting: Podiatry

## 2024-09-07 ENCOUNTER — Ambulatory Visit: Admitting: Podiatry

## 2024-09-13 ENCOUNTER — Encounter (HOSPITAL_COMMUNITY): Payer: Self-pay | Admitting: Cardiology

## 2024-09-13 ENCOUNTER — Ambulatory Visit (HOSPITAL_COMMUNITY)
Admission: RE | Admit: 2024-09-13 | Discharge: 2024-09-13 | Disposition: A | Discharge status: Home / Self Care | Source: Ambulatory Visit | Attending: Cardiology | Admitting: Cardiology

## 2024-09-13 ENCOUNTER — Telehealth (HOSPITAL_COMMUNITY): Payer: Self-pay

## 2024-09-13 ENCOUNTER — Other Ambulatory Visit (HOSPITAL_COMMUNITY): Payer: Self-pay

## 2024-09-13 VITALS — BP 132/68 | HR 94 | Ht 70.0 in | Wt 181.8 lb

## 2024-09-13 DIAGNOSIS — I110 Hypertensive heart disease with heart failure: Secondary | ICD-10-CM | POA: Insufficient documentation

## 2024-09-13 DIAGNOSIS — I2510 Atherosclerotic heart disease of native coronary artery without angina pectoris: Secondary | ICD-10-CM | POA: Insufficient documentation

## 2024-09-13 DIAGNOSIS — E785 Hyperlipidemia, unspecified: Secondary | ICD-10-CM | POA: Diagnosis not present

## 2024-09-13 DIAGNOSIS — Z7984 Long term (current) use of oral hypoglycemic drugs: Secondary | ICD-10-CM | POA: Diagnosis not present

## 2024-09-13 DIAGNOSIS — Z79899 Other long term (current) drug therapy: Secondary | ICD-10-CM | POA: Insufficient documentation

## 2024-09-13 DIAGNOSIS — Z85118 Personal history of other malignant neoplasm of bronchus and lung: Secondary | ICD-10-CM | POA: Insufficient documentation

## 2024-09-13 DIAGNOSIS — I517 Cardiomegaly: Secondary | ICD-10-CM

## 2024-09-13 DIAGNOSIS — I5032 Chronic diastolic (congestive) heart failure: Secondary | ICD-10-CM | POA: Insufficient documentation

## 2024-09-13 DIAGNOSIS — I5043 Acute on chronic combined systolic (congestive) and diastolic (congestive) heart failure: Secondary | ICD-10-CM

## 2024-09-13 DIAGNOSIS — G629 Polyneuropathy, unspecified: Secondary | ICD-10-CM | POA: Insufficient documentation

## 2024-09-13 DIAGNOSIS — R9431 Abnormal electrocardiogram [ECG] [EKG]: Secondary | ICD-10-CM

## 2024-09-13 NOTE — Progress Notes (Signed)
   ADVANCED HEART FAILURE NEW PATIENT CLINIC NOTE  Referring Physician: Shayne Anes, MD  Primary Care: Shayne Anes, MD Primary Cardiologist:  HPI: Alvin Hardy is a 89 y.o. male with a PMH of HFpEF, HTN who presents for initial visit for further evaluation and treatment of cardiac amyloid.     Patient with a long standing history of HFpEF, mild CAD, HTN, treated lung cancer, and GERD.   History of lung cancer with a VATS procedure of the right upper lobe, radiation therapy. CTA with calcium  score of 3.4, mild plaque, now on repatha.  Recently presented to the ED 10/8 for acute on chronic diastolic heart failure. Echo without strain, but his history and EKG were concerning for cardiac amyloid so PYP was ordered. Read as positive and reviewed personally, would agree with Grade III read.       SUBJECTIVE:  Patient overall doing well, his legs and breathing have significantly improved since he was started on diuresis. He has multiple excellent questions about the clinical course of cardiac amyloid as well as potential treatment and lifestyle options. Has been starting to eat better, watching his BP and weight carefully. No significant worsening or change in his symptoms. No family history of amyloid. Has some mild neuropathy in his lower extremities that has been ongoing for some time, but not limiting or worsening.   PMH, current medications, allergies, social history, and family history reviewed in epic.  PHYSICAL EXAM: Vitals:   09/13/24 1402  BP: 132/68  Pulse: 94  SpO2: 99%   GENERAL: Well nourished and in no apparent distress at rest.  PULM:  Normal work of breathing, clear to auscultation bilaterally. Respirations are unlabored.  CARDIAC:  JVP: flat         Normal rate with regular rhythm. No murmurs, rubs or gallops.  No edema. Warm and well perfused extremities. ABDOMEN: Soft, non-tender, non-distended. NEUROLOGIC: Patient is oriented x3 with no focal or lateralizing  neurologic deficits.    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 Currently euvolemic, minimal neuropathy, AL labs reviewed and unremarkable. - Limited benefit of TTR genetics given age, suspected wt - Start tafamadis, discussed with pharmacy. If cost prohibitive can transition to attruby - Appears euvolemic, continue lasix  40mg  daily - Continue farxiga 10mg  daily, spironolactone 25mg  daily - No role for BB in this patient population - Continue Kcl 20mEq daily  HLD: Mild coronary plaque and calcium  noted during previous workup. - Continue repatha  Follow up in 3 months  Morene Brownie, MD Advanced Heart Failure Mechanical Circulatory Support 09/14/24

## 2024-09-13 NOTE — Telephone Encounter (Signed)
 Advanced Heart Failure Patient Advocate Encounter  Prior authorization started for Vyndamax (Key [id number]). Prior authorization has not yet been submitted.

## 2024-09-13 NOTE — Patient Instructions (Addendum)
 START Tafamadis once we call you and tell you it has been approved   Your physician recommends that you schedule a follow-up appointment in: 3 months ( February 2026) ** PLEASE CALL THE OFFICE IN DECEMBER TO ARRANGE YOUR FOLLOW UP APPOINTMENT.**  If you have any questions or concerns before your next appointment please send us  a message through [facility] or call our office at [phone].    TO LEAVE A MESSAGE FOR THE NURSE SELECT OPTION 2, PLEASE LEAVE A MESSAGE INCLUDING: YOUR NAME DATE OF BIRTH CALL BACK NUMBER REASON FOR CALL**this is important as we prioritize the call backs  YOU WILL RECEIVE A CALL BACK THE SAME DAY AS LONG AS YOU CALL BEFORE 4:00 PM  At the Advanced Heart Failure Clinic, you and your health needs are our priority. As part of our continuing mission to provide you with exceptional heart care, we have created designated Provider Care Teams. These Care Teams include your primary Cardiologist (physician) and Advanced Practice Providers (APPs- Physician Assistants and Nurse Practitioners) who all work together to provide you with the care you need, when you need it.   You may see any of the following providers on your designated Care Team at your next follow up: Dr Toribio Fuel Dr Ezra Shuck Dr. Morene Brownie Greig Mosses, NP Caffie Shed, [place] [facility] [place], [place] Beckey Coe, NP Jordan Lee, NP Ellouise Class, NP Tinnie Redman, PharmD Jaun Bash, PharmD   Please be sure to bring in all your medications bottles to every appointment.    Thank you for choosing [facility]

## 2024-09-14 ENCOUNTER — Other Ambulatory Visit: Payer: Self-pay

## 2024-09-14 ENCOUNTER — Encounter (HOSPITAL_COMMUNITY): Payer: Self-pay

## 2024-09-14 ENCOUNTER — Other Ambulatory Visit (HOSPITAL_COMMUNITY): Payer: Self-pay

## 2024-09-14 ENCOUNTER — Telehealth (HOSPITAL_COMMUNITY): Payer: Self-pay | Admitting: Pharmacist

## 2024-09-14 MED ORDER — VYNDAMAX 61 MG PO CAPS
1.0000 | ORAL_CAPSULE | Freq: Every day | ORAL | 11 refills | Status: DC
  Filled 2024-09-14: qty 30, 30d supply, fill #0
  Filled 2024-10-09 – 2024-10-10 (×4): qty 30, 30d supply, fill #1

## 2024-09-14 NOTE — Progress Notes (Signed)
 Specialty Pharmacy Initiation Note   Alvin Hardy is a 89 y.o. male who will be followed by the specialty pharmacy service for RxSp Cardiology    Review of administration, indication, effectiveness, safety, potential side effects, storage/disposable, and missed dose instructions occurred today for patient's specialty medication(s) Tafamidis (Vyndamax)     Patient/Caregiver asked additional questions regarding disease state pathophysiology.  Patient's therapy is appropriate to: Initiate    Goals Addressed             This Visit's Progress    Stabilization of disease       Patient is initiating therapy. Patient will maintain adherence

## 2024-09-14 NOTE — Telephone Encounter (Signed)
 Prior authorization for Vyndamax has been submitted and approved. Test billing returns $6,000 for 30 day supply. Vyndamax copay card has been activated and added to [facility] for patient.  Key: [id number] Effective: 08/15/2024 to 09/14/2025  Copay BIN [id number] Copay PCN [facility] Copay ID [id number] Copay Group [id number]  Rachel DEL, CPhT Rx Patient Advocate Phone: [phone]

## 2024-09-14 NOTE — Progress Notes (Signed)
 Specialty Pharmacy Refill Coordination Note  Alvin Hardy is a 89 y.o. male contacted today regarding refills of specialty medication(s) Tafamidis (Vyndamax)   Patient requested Delivery   Delivery date: 09/18/24   Verified address: [address] [place] [address]   Medication will be filled on: 09/17/24

## 2024-09-14 NOTE — Telephone Encounter (Signed)
 Tafamidis sent per Dr. Zenaida

## 2024-09-14 NOTE — Progress Notes (Signed)
 Specialty Pharmacy Initial Fill Coordination Note  Alvin Hardy is a 89 y.o. male contacted today regarding initial fill of specialty medication(s) Tafamidis (Vyndamax)   Patient requested [name] at [facility] [place] Set date: 09/14/24   Medication will be filled on: 09/14/24    Patient is aware of $0 copayment.

## 2024-09-17 ENCOUNTER — Other Ambulatory Visit: Payer: Self-pay

## 2024-09-17 ENCOUNTER — Other Ambulatory Visit (HOSPITAL_COMMUNITY): Payer: Self-pay

## 2024-09-26 ENCOUNTER — Other Ambulatory Visit: Payer: Self-pay

## 2024-10-01 ENCOUNTER — Other Ambulatory Visit (HOSPITAL_COMMUNITY): Payer: Self-pay

## 2024-10-01 ENCOUNTER — Other Ambulatory Visit: Payer: Self-pay

## 2024-10-09 ENCOUNTER — Other Ambulatory Visit: Payer: Self-pay

## 2024-10-09 ENCOUNTER — Other Ambulatory Visit (HOSPITAL_COMMUNITY): Payer: Self-pay

## 2024-10-09 NOTE — Progress Notes (Signed)
 The patient contacted the pharmacy regarding his Vyndamax  after receiving a letter from [facility] stating the medication will no longer be covered at our location. A test claim was attempted and confirmed the information provided by the patient.   The patient was assured that we will reach out to our advocate and his provider's office to determine which pharmacy his insurance will cover for this medication. A new prescription will be sent to the appropriate pharmacy once identified, and the patient will be contacted at his listed phone number. The patient has an estimate of 5-7 days worth of medication on hand. The patient expressed appreciation for the assistance provided.

## 2024-10-10 ENCOUNTER — Other Ambulatory Visit (HOSPITAL_COMMUNITY): Payer: Self-pay

## 2024-10-10 ENCOUNTER — Other Ambulatory Visit: Payer: Self-pay

## 2024-10-10 ENCOUNTER — Telehealth (HOSPITAL_COMMUNITY): Payer: Self-pay

## 2024-10-10 MED ORDER — VYNDAMAX 61 MG PO CAPS: 1.0000 | ORAL_CAPSULE | Freq: Every day | ORAL | 11 refills | Status: DC

## 2024-10-10 NOTE — Addendum Note (Signed)
 Addended by: Kathryne Ramella, DALTON HERO on: 10/10/2024 11:39 AM   Modules accepted: Orders

## 2024-10-10 NOTE — Telephone Encounter (Signed)
 Advanced Heart Failure Patient Advocate Encounter  Patient contacted me after receiving a letter from [employer] that he will need to use preferred specialty pharmacy after first fill.  Contacted [facility], confirmed [facility] is preferred. Requested new rx be sent [facility]  [facility], disenrolling patient with [facility].  Rachel DEL, CPhT Rx Patient Advocate Phone: [phone]

## 2024-10-12 ENCOUNTER — Other Ambulatory Visit (HOSPITAL_COMMUNITY): Payer: Self-pay

## 2024-10-12 MED ORDER — VYNDAMAX 61 MG PO CAPS: 1.0000 | ORAL_CAPSULE | Freq: Every day | ORAL | 11 refills | Status: AC

## 2024-11-05 ENCOUNTER — Other Ambulatory Visit: Payer: Self-pay

## 2024-11-05 ENCOUNTER — Other Ambulatory Visit (HOSPITAL_COMMUNITY): Payer: Self-pay

## 2024-11-13 ENCOUNTER — Ambulatory Visit (HOSPITAL_COMMUNITY)
Admission: RE | Admit: 2024-11-13 | Discharge: 2024-11-13 | Disposition: A | Source: Ambulatory Visit | Attending: Cardiology | Admitting: Cardiology

## 2024-11-13 ENCOUNTER — Encounter (HOSPITAL_COMMUNITY): Payer: Self-pay | Admitting: Cardiology

## 2024-11-13 VITALS — BP 118/74 | HR 87 | Ht 70.0 in | Wt 176.0 lb

## 2024-11-13 DIAGNOSIS — I110 Hypertensive heart disease with heart failure: Secondary | ICD-10-CM | POA: Diagnosis present

## 2024-11-13 DIAGNOSIS — Z7984 Long term (current) use of oral hypoglycemic drugs: Secondary | ICD-10-CM | POA: Insufficient documentation

## 2024-11-13 DIAGNOSIS — I5043 Acute on chronic combined systolic (congestive) and diastolic (congestive) heart failure: Secondary | ICD-10-CM | POA: Diagnosis not present

## 2024-11-13 DIAGNOSIS — Z923 Personal history of irradiation: Secondary | ICD-10-CM | POA: Diagnosis not present

## 2024-11-13 DIAGNOSIS — I43 Cardiomyopathy in diseases classified elsewhere: Secondary | ICD-10-CM | POA: Insufficient documentation

## 2024-11-13 DIAGNOSIS — I5032 Chronic diastolic (congestive) heart failure: Secondary | ICD-10-CM | POA: Insufficient documentation

## 2024-11-13 DIAGNOSIS — I10 Essential (primary) hypertension: Secondary | ICD-10-CM | POA: Diagnosis not present

## 2024-11-13 DIAGNOSIS — Z85118 Personal history of other malignant neoplasm of bronchus and lung: Secondary | ICD-10-CM | POA: Insufficient documentation

## 2024-11-13 DIAGNOSIS — Z79899 Other long term (current) drug therapy: Secondary | ICD-10-CM | POA: Diagnosis not present

## 2024-11-13 DIAGNOSIS — R9431 Abnormal electrocardiogram [ECG] [EKG]: Secondary | ICD-10-CM

## 2024-11-13 DIAGNOSIS — R0602 Shortness of breath: Secondary | ICD-10-CM | POA: Diagnosis present

## 2024-11-13 DIAGNOSIS — I2510 Atherosclerotic heart disease of native coronary artery without angina pectoris: Secondary | ICD-10-CM | POA: Diagnosis not present

## 2024-11-13 DIAGNOSIS — E854 Organ-limited amyloidosis: Secondary | ICD-10-CM | POA: Insufficient documentation

## 2024-11-13 DIAGNOSIS — E785 Hyperlipidemia, unspecified: Secondary | ICD-10-CM | POA: Insufficient documentation

## 2024-11-13 DIAGNOSIS — K219 Gastro-esophageal reflux disease without esophagitis: Secondary | ICD-10-CM | POA: Insufficient documentation

## 2024-11-13 NOTE — Patient Instructions (Signed)
 INCREASE Lasix  to 40 mg Twice daily for 7 days. Then go back to 40 mg daily.  Your physician recommends that you schedule a follow-up appointment in: 3 weeks.  If you have any questions or concerns before your next appointment please send us  a message through [place] or call our office at [phone].    TO LEAVE A MESSAGE FOR THE NURSE SELECT OPTION 2, PLEASE LEAVE A MESSAGE INCLUDING: YOUR NAME DATE OF BIRTH CALL BACK NUMBER REASON FOR CALL**this is important as we prioritize the call backs  YOU WILL RECEIVE A CALL BACK THE SAME DAY AS LONG AS YOU CALL BEFORE 4:00 PM  At the [facility], you and your health needs are our priority. As part of our continuing mission to provide you with exceptional heart care, we have created designated Provider Care Teams. These Care Teams include your primary Cardiologist (physician) and Advanced Practice Providers (APPs- Physician Assistants and Nurse Practitioners) who all work together to provide you with the care you need, when you need it.   You may see any of the following providers on your designated Care Team at your next follow up: Dr Toribio Fuel Dr Ezra Shuck Dr. Morene Brownie Greig Mosses, NP Caffie Shed, [place] [facility] [place], [place] Beckey Coe, NP Jordan Lee, NP Ellouise Class, NP Tinnie Redman, PharmD Jaun Bash, PharmD   Please be sure to bring in all your medications bottles to every appointment.    Thank you for choosing [facility]

## 2024-11-16 NOTE — Progress Notes (Signed)
" ° °  ADVANCED HEART FAILURE FOLLOW UP CLINIC NOTE  Referring Physician: Shayne Anes, MD  Primary Care: Shayne Anes, MD Primary Cardiologist:  HPI: Alvin Hardy is a 89 y.o. male with a PMH of HFpEF, HTN who presents for initial visit for further evaluation and treatment of cardiac amyloid.     Patient with a long standing history of HFpEF, mild CAD, HTN, treated lung cancer, and GERD.   History of lung cancer with a VATS procedure of the right upper lobe, radiation therapy. CTA with calcium  score of 3.4, mild plaque, now on repatha.  Recently presented to the ED 10/8 for acute on chronic diastolic heart failure. Echo without strain, but his history and EKG were concerning for cardiac amyloid so PYP was ordered. Read as positive and reviewed personally, would agree with Grade III read.       SUBJECTIVE:  Patient with multiple concerns today.  He reports that for the past few weeks he has been having worsening shortness of breath, this especially happens when he is bending over or lying down.  He notes that some of the same activities he used to be able to do he is no longer able to including taking his dog out to the bathroom.  He is struggling mentally right now as his estranged son is currently in the hospital due to alcohol  related driving issues.  He is concerned about his clinical trajectory.  PMH, current medications, allergies, social history, and family history reviewed in epic.  PHYSICAL EXAM: Vitals:   11/13/24 1415  BP: 118/74  Pulse: 87  SpO2: 98%   GENERAL: NAD, elderly, frail appearing PULM:  Normal work of breathing, CTAB CARDIAC:  JVP: mildly elevated         Normal rate with regular rhythm. .  Systolic murmur, trace edema. Warm and well perfused extremities. ABDOMEN: Soft, non-tender, non-distended. NEUROLOGIC: Patient is oriented x3 with no focal or lateralizing neurologic deficits.     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 Worsening symptoms of orthopnea and bendopnea, exam relatively benign but suspect elevated filling pressures in the setting of amyloid. - Increase lasix  to 40mg  BID for 1 week than back to 40mg  daily - Close follow up for labs and assessment - Limited benefit of TTR genetics given age, suspected wt - Continue tafamadis, if disease progression could consider transition, would check prealbumin - Continue farxiga  10mg  daily, spironolactone  25mg  daily - No role for BB in this patient population - Continue Kcl 20mEq daily  HLD: Mild coronary plaque and calcium  noted during previous workup. - Continue repatha  Follow up in 3 weeks  I spent 42 minutes caring for this patient today including face to face time, ordering and reviewing labs, discussing multiple clinical concerns, seeing the patient, documenting in the record, and arranging follow ups.   Morene Brownie, MD Advanced Heart Failure Mechanical Circulatory Support 11/16/24  "

## 2024-11-22 ENCOUNTER — Ambulatory Visit (INDEPENDENT_AMBULATORY_CARE_PROVIDER_SITE_OTHER): Admitting: Podiatry

## 2024-11-22 DIAGNOSIS — Z91199 Patient's noncompliance with other medical treatment and regimen due to unspecified reason: Secondary | ICD-10-CM

## 2024-11-22 NOTE — Progress Notes (Signed)
 No show

## 2024-12-05 ENCOUNTER — Ambulatory Visit (HOSPITAL_COMMUNITY)

## 2024-12-10 ENCOUNTER — Ambulatory Visit: Admitting: Podiatry

## 2024-12-14 ENCOUNTER — Ambulatory Visit (HOSPITAL_COMMUNITY)
